# Patient Record
Sex: Male | Born: 1950
Health system: Southern US, Community
[De-identification: ages and names within clinical notes are randomized; demographics above are authoritative.]

## PROBLEM LIST (undated history)

## (undated) DIAGNOSIS — I10 Essential (primary) hypertension: Secondary | ICD-10-CM

## (undated) DIAGNOSIS — I4510 Unspecified right bundle-branch block: Secondary | ICD-10-CM

## (undated) DIAGNOSIS — K219 Gastro-esophageal reflux disease without esophagitis: Secondary | ICD-10-CM

## (undated) DIAGNOSIS — M1990 Unspecified osteoarthritis, unspecified site: Secondary | ICD-10-CM

## (undated) DIAGNOSIS — R9431 Abnormal electrocardiogram [ECG] [EKG]: Secondary | ICD-10-CM

## (undated) DIAGNOSIS — E785 Hyperlipidemia, unspecified: Secondary | ICD-10-CM

## (undated) DIAGNOSIS — M75100 Unspecified rotator cuff tear or rupture of unspecified shoulder, not specified as traumatic: Secondary | ICD-10-CM

## (undated) DIAGNOSIS — G4730 Sleep apnea, unspecified: Secondary | ICD-10-CM

## (undated) DIAGNOSIS — I739 Peripheral vascular disease, unspecified: Secondary | ICD-10-CM

## (undated) DIAGNOSIS — E039 Hypothyroidism, unspecified: Secondary | ICD-10-CM

## (undated) DIAGNOSIS — M109 Gout, unspecified: Secondary | ICD-10-CM

## (undated) HISTORY — PX: JOINT REPLACEMENT: SHX530

## (undated) HISTORY — PX: CATARACT EXTRACTION: SUR2

## (undated) HISTORY — DX: Gout, unspecified: M10.9

## (undated) HISTORY — DX: Unspecified right bundle-branch block: I45.10

## (undated) HISTORY — DX: Abnormal electrocardiogram (ECG) (EKG): R94.31

## (undated) HISTORY — DX: Sleep apnea, unspecified: G47.30

## (undated) HISTORY — DX: Unspecified rotator cuff tear or rupture of unspecified shoulder, not specified as traumatic: M75.100

## (undated) HISTORY — PX: EYE SURGERY: SHX253

## (undated) HISTORY — DX: Essential (primary) hypertension: I10

## (undated) HISTORY — PX: TONSILLECTOMY: SUR1361

## (undated) HISTORY — PX: APPENDECTOMY: SHX54

## (undated) HISTORY — PX: SEPTOPLASTY: SUR1290

## (undated) HISTORY — DX: Hyperlipidemia, unspecified: E78.5

## (undated) HISTORY — PX: FEMORAL ENDARTERECTOMY: SUR606

---

## 2002-10-07 ENCOUNTER — Ambulatory Visit (HOSPITAL_COMMUNITY): Admission: RE | Admit: 2002-10-07 | Discharge: 2002-10-07 | Payer: Self-pay | Admitting: *Deleted

## 2003-10-09 ENCOUNTER — Ambulatory Visit (HOSPITAL_COMMUNITY): Admission: RE | Admit: 2003-10-09 | Discharge: 2003-10-09 | Payer: Self-pay | Admitting: *Deleted

## 2003-11-17 ENCOUNTER — Ambulatory Visit (HOSPITAL_COMMUNITY): Admission: RE | Admit: 2003-11-17 | Discharge: 2003-11-17 | Payer: Self-pay | Admitting: *Deleted

## 2004-12-28 ENCOUNTER — Encounter: Admission: RE | Admit: 2004-12-28 | Discharge: 2004-12-28 | Payer: Self-pay | Admitting: Family Medicine

## 2005-02-16 ENCOUNTER — Encounter: Admission: RE | Admit: 2005-02-16 | Discharge: 2005-02-16 | Payer: Self-pay | Admitting: Cardiovascular Disease

## 2005-02-21 ENCOUNTER — Ambulatory Visit (HOSPITAL_COMMUNITY): Admission: RE | Admit: 2005-02-21 | Discharge: 2005-02-21 | Payer: Self-pay | Admitting: Cardiovascular Disease

## 2005-03-08 ENCOUNTER — Ambulatory Visit (HOSPITAL_COMMUNITY): Admission: RE | Admit: 2005-03-08 | Discharge: 2005-03-08 | Payer: Self-pay | Admitting: Cardiovascular Disease

## 2005-05-11 ENCOUNTER — Inpatient Hospital Stay (HOSPITAL_COMMUNITY): Admission: RE | Admit: 2005-05-11 | Discharge: 2005-05-13 | Payer: Self-pay | Admitting: *Deleted

## 2005-11-28 ENCOUNTER — Encounter: Admission: RE | Admit: 2005-11-28 | Discharge: 2005-11-28 | Payer: Self-pay | Admitting: Cardiovascular Disease

## 2010-09-05 ENCOUNTER — Encounter: Payer: Self-pay | Admitting: Cardiovascular Disease

## 2013-03-25 ENCOUNTER — Other Ambulatory Visit: Payer: Self-pay | Admitting: Gastroenterology

## 2016-10-04 ENCOUNTER — Other Ambulatory Visit: Payer: Self-pay | Admitting: Physician Assistant

## 2016-10-04 DIAGNOSIS — M25519 Pain in unspecified shoulder: Secondary | ICD-10-CM

## 2016-10-17 ENCOUNTER — Ambulatory Visit
Admission: RE | Admit: 2016-10-17 | Discharge: 2016-10-17 | Disposition: A | Discharge status: Home / Self Care | Payer: BLUE CROSS/BLUE SHIELD | Source: Ambulatory Visit | Attending: Physician Assistant | Admitting: Physician Assistant

## 2016-10-17 DIAGNOSIS — M25519 Pain in unspecified shoulder: Secondary | ICD-10-CM

## 2016-11-18 ENCOUNTER — Other Ambulatory Visit: Payer: Self-pay | Admitting: Orthopedic Surgery

## 2016-12-01 NOTE — H&P (Signed)
  Norman Lambert is an 66 y.o. male.    Chief Complaint: right shoulder pain and weakness  HPI: Pt is a 66 y.o. male complaining of right shoulder pain for multiple years. Pain had continually increased since the beginning. X-rays in the clinic show end-stage arthritic changes of the right shoulder. Pt has tried various conservative treatments which have failed to alleviate their symptoms, including injections and therapy. Various options are discussed with the patient. Risks, benefits and expectations were discussed with the patient. Patient understand the risks, benefits and expectations and wishes to proceed with surgery.   PCP:  No primary care provider on file.  D/C Plans: Home  PMH: No past medical history on file.  PSH: No past surgical history on file.  Social History:  has no tobacco, alcohol, and drug history on file.  Allergies:  Allergies not on file  Medications: No current facility-administered medications for this encounter.    No current outpatient prescriptions on file.    No results found for this or any previous visit (from the past 48 hour(s)). No results found.  ROS: Pain with rom of the right upper extremity  Physical Exam:  Alert and oriented 66 y.o. male in no acute distress Cranial nerves 2-12 intact Cervical spine: full rom with no tenderness, nv intact distally Chest: active breath sounds bilaterally, no wheeze rhonchi or rales Heart: regular rate and rhythm, no murmur Abd: non tender non distended with active bowel sounds Hip is stable with rom  Right shoulder with limited rom and strength with ER and IR No rashes or edema  Assessment/Plan Assessment: right shoulder cuff arthropathy  Plan: Patient will undergo a right reverse total shoulder by Dr. Veverly Fells at [facility]. Risks benefits and expectations were discussed with the patient. Patient understand risks, benefits and expectations and wishes to proceed.

## 2016-12-07 NOTE — Pre-Procedure Instructions (Signed)
Norman Lambert  12/07/2016     No Pharmacies Listed   Your procedure is scheduled on May 4  Report to [facility] at 830 A.M.  Call this number if you have problems the morning of surgery:  [phone]   Remember:  Do not eat food or drink liquids after midnight.  Take these medicines the morning of surgery with A SIP OF WATER   Stop taking aspirin, BC's, Goody's, herbal medications, Fish Oil, Vitamins, Ibuprofen, Advil, Motrin, Aleve   Do not wear jewelry, make-up or nail polish.  Do not wear lotions, powders, or perfumes, or deoderant.  Do not shave 48 hours prior to surgery.  Men may shave face and neck.  Do not bring valuables to the hospital.  [facility] is not responsible for any belongings or valuables.  Contacts, dentures or bridgework may not be worn into surgery.  Leave your suitcase in the car.  After surgery it may be brought to your room.  For patients admitted to the hospital, discharge time will be determined by your treatment team.  Patients discharged the day of surgery will not be allowed to drive home.    Special instructions:  [place] - Preparing for Surgery  Before surgery, you can play an important role.  Because skin is not sterile, your skin needs to be as free of germs as possible.  You can reduce the number of germs on you skin by washing with CHG (chlorahexidine gluconate) soap before surgery.  CHG is an antiseptic cleaner which kills germs and bonds with the skin to continue killing germs even after washing.  Please DO NOT use if you have an allergy to CHG or antibacterial soaps.  If your skin becomes reddened/irritated stop using the CHG and inform your nurse when you arrive at Short Stay.  Do not shave (including legs and underarms) for at least 48 hours prior to the first CHG shower.  You may shave your face.  Please follow these instructions carefully:   1.  Shower with CHG Soap the night before surgery and the                                 morning of Surgery.  2.  If you choose to wash your hair, wash your hair first as usual with your       normal shampoo.  3.  After you shampoo, rinse your hair and body thoroughly to remove the                      Shampoo.  4.  Use CHG as you would any other liquid soap.  You can apply chg directly       to the skin and wash gently with scrungie or a clean washcloth.  5.  Apply the CHG Soap to your body ONLY FROM THE NECK DOWN.        Do not use on open wounds or open sores.  Avoid contact with your eyes,       ears, mouth and genitals (private parts).  Wash genitals (private parts)       with your normal soap.  6.  Wash thoroughly, paying special attention to the area where your surgery        will be performed.  7.  Thoroughly rinse your body with warm water from the neck down.  8.  DO NOT shower/wash with your normal soap after using and rinsing off       the CHG Soap.  9.  Pat yourself dry with a clean towel.            10.  Wear clean pajamas.            11.  Place clean sheets on your bed the night of your first shower and do not        sleep with pets.  Day of Surgery  Do not apply any lotions/deoderants the morning of surgery.  Please wear clean clothes to the hospital/surgery center.     Please read over the following fact sheets that you were given. Pain Booklet, Coughing and Deep Breathing, MRSA Information and Surgical Site Infection Prevention

## 2016-12-08 ENCOUNTER — Encounter (HOSPITAL_COMMUNITY): Payer: Self-pay

## 2016-12-08 ENCOUNTER — Encounter (HOSPITAL_COMMUNITY)
Admission: RE | Admit: 2016-12-08 | Discharge: 2016-12-08 | Disposition: A | Discharge status: Home / Self Care | Payer: BLUE CROSS/BLUE SHIELD | Source: Ambulatory Visit | Attending: Orthopedic Surgery | Admitting: Orthopedic Surgery

## 2016-12-08 DIAGNOSIS — I739 Peripheral vascular disease, unspecified: Secondary | ICD-10-CM | POA: Insufficient documentation

## 2016-12-08 DIAGNOSIS — E039 Hypothyroidism, unspecified: Secondary | ICD-10-CM | POA: Insufficient documentation

## 2016-12-08 DIAGNOSIS — I4510 Unspecified right bundle-branch block: Secondary | ICD-10-CM | POA: Insufficient documentation

## 2016-12-08 DIAGNOSIS — Z01810 Encounter for preprocedural cardiovascular examination: Secondary | ICD-10-CM | POA: Insufficient documentation

## 2016-12-08 DIAGNOSIS — I10 Essential (primary) hypertension: Secondary | ICD-10-CM | POA: Diagnosis not present

## 2016-12-08 DIAGNOSIS — Z01812 Encounter for preprocedural laboratory examination: Secondary | ICD-10-CM | POA: Insufficient documentation

## 2016-12-08 DIAGNOSIS — M12811 Other specific arthropathies, not elsewhere classified, right shoulder: Secondary | ICD-10-CM | POA: Insufficient documentation

## 2016-12-08 DIAGNOSIS — K219 Gastro-esophageal reflux disease without esophagitis: Secondary | ICD-10-CM | POA: Diagnosis not present

## 2016-12-08 HISTORY — DX: Peripheral vascular disease, unspecified: I73.9

## 2016-12-08 HISTORY — DX: Gastro-esophageal reflux disease without esophagitis: K21.9

## 2016-12-08 HISTORY — DX: Hypothyroidism, unspecified: E03.9

## 2016-12-08 HISTORY — DX: Essential (primary) hypertension: I10

## 2016-12-08 LAB — BASIC METABOLIC PANEL
Anion gap: 11 (ref 5–15)
BUN: 9 mg/dL (ref 6–20)
CALCIUM: 9.4 mg/dL (ref 8.9–10.3)
CO2: 26 mmol/L (ref 22–32)
CREATININE: 0.59 mg/dL — AB (ref 0.61–1.24)
Chloride: 101 mmol/L (ref 101–111)
GFR calc Af Amer: >60 mL/min (ref >60)
GLUCOSE: 91 mg/dL (ref 65–99)
Potassium: 3.3 mmol/L — ABNORMAL LOW (ref 3.5–5.1)
Sodium: 138 mmol/L (ref 135–145)

## 2016-12-08 LAB — CBC
HCT: 39.9 % (ref 39.0–52.0)
HEMOGLOBIN: 13.5 g/dL (ref 13.0–17.0)
MCH: 30.7 pg (ref 26.0–34.0)
MCHC: 33.8 g/dL (ref 30.0–36.0)
MCV: 90.7 fL (ref 78.0–100.0)
PLATELETS: 177 10*3/uL (ref 150–400)
RBC: 4.4 MIL/uL (ref 4.22–5.81)
RDW: 13.6 % (ref 11.5–15.5)
WBC: 5.6 10*3/uL (ref 4.0–10.5)

## 2016-12-08 LAB — SURGICAL PCR SCREEN
MRSA, PCR: NEGATIVE
STAPHYLOCOCCUS AUREUS: NEGATIVE

## 2016-12-08 NOTE — Pre-Procedure Instructions (Signed)
Norman Lambert  12/08/2016      [id number] - [place], [place] - [address] [name] [address] Phone: [phone] Fax: [phone]    Your procedure is scheduled on Fri. May 4  Report to [facility] Admitting at 8:30 A.M.  Call this number if you have problems the morning of surgery:  [phone]   Remember:  Do not eat food or drink liquids after midnight on Thurs. May 3   Take these medicines the morning of surgery with A SIP OF WATER : levothyroxine (synthroid), xyzal, prilosec, flomax             1 week prior to surgery stop advil, motrin, ibuprofen, vitamins/herbal medicines.   Do not wear jewelry.  Do not wear lotions, powders, or perfumes, or deoderant.  Do not shave 48 hours prior to surgery.  Men may shave face and neck.  Do not bring valuables to the hospital.  [facility] is not responsible for any belongings or valuables.  Contacts, dentures or bridgework may not be worn into surgery.  Leave your suitcase in the car.  After surgery it may be brought to your room.  For patients admitted to the hospital, discharge time will be determined by your treatment team.  Patients discharged the day of surgery will not be allowed to drive home.    Special instructions:  [place]- Preparing For Surgery  Before surgery, you can play an important role. Because skin is not sterile, your skin needs to be as free of germs as possible. You can reduce the number of germs on your skin by washing with CHG (chlorahexidine gluconate) Soap before surgery.  CHG is an antiseptic cleaner which kills germs and bonds with the skin to continue killing germs even after washing.  Please do not use if you have an allergy to CHG or antibacterial soaps. If your skin becomes reddened/irritated stop using the CHG.  Do not shave (including legs and underarms) for at least 48 hours prior to first CHG shower. It is OK to shave your  face.  Please follow these instructions carefully.   1. Shower the NIGHT BEFORE SURGERY and the MORNING OF SURGERY with CHG.   2. If you chose to wash your hair, wash your hair first as usual with your normal shampoo.  3. After you shampoo, rinse your hair and body thoroughly to remove the shampoo.  4. Use CHG as you would any other liquid soap. You can apply CHG directly to the skin and wash gently with a scrungie or a clean washcloth.   5. Apply the CHG Soap to your body ONLY FROM THE NECK DOWN.  Do not use on open wounds or open sores. Avoid contact with your eyes, ears, mouth and genitals (private parts). Wash genitals (private parts) with your normal soap.  6. Wash thoroughly, paying special attention to the area where your surgery will be performed.  7. Thoroughly rinse your body with warm water from the neck down.  8. DO NOT shower/wash with your normal soap after using and rinsing off the CHG Soap.  9. Pat yourself dry with a CLEAN TOWEL.   10. Wear CLEAN PAJAMAS   11. Place CLEAN SHEETS on your bed the night of your first shower and DO NOT SLEEP WITH PETS.    Day of Surgery: Do not apply any deodorants/lotions. Please wear clean clothes to the hospital/surgery center.  Please read over the following fact sheets that you were given. Coughing and Deep Breathing, MRSA Information and Surgical Site Infection Prevention

## 2016-12-08 NOTE — Progress Notes (Signed)
PCP: Merian Capron, PA @ [facility]  Cardiologist: none

## 2016-12-09 NOTE — Progress Notes (Signed)
Anesthesia Chart Review:  Pt is a 66 year old male scheduled for R reverse shoulder arthroplasty on 12/16/2016 with Netta Cedars, M.D.  - PCP is Corine Shelter, PA who has cleared pt for surgery.   PMH includes: HTN, PVD, hypothyroidism, GERD. Former smoker. BMI 30.  Medications include: Albuterol, amlodipine-benazepril, HCTZ, levothyroxine, Provigil, Prilosec, rosuvastatin  Preoperative labs reviewed.  EKG 12/08/16: Sinus rhythm with PACs. RBBB is new since previous tracing 05/06/05.  - I will forward EKG to PCP for follow up.   If no changes, I anticipate pt can proceed with surgery as scheduled.   Willeen Cass, FNP-BC [facility] Short Stay Surgical Center/Anesthesiology Phone: [phone] 12/09/2016 3:14 PM

## 2016-12-13 ENCOUNTER — Telehealth: Payer: Self-pay | Admitting: *Deleted

## 2016-12-13 NOTE — Telephone Encounter (Signed)
NOTES SENT TO SCHEDULING.  °

## 2016-12-15 MED ORDER — CEFAZOLIN SODIUM-DEXTROSE 2-4 GM/100ML-% IV SOLN
2.0000 g | INTRAVENOUS | Status: AC
  Administered 2016-12-16: 2 g via INTRAVENOUS
  Filled 2016-12-15: qty 100

## 2016-12-16 ENCOUNTER — Encounter (HOSPITAL_COMMUNITY)
Admission: RE | Disposition: A | Discharge status: Home / Self Care | Payer: Self-pay | Source: Ambulatory Visit | Attending: Orthopedic Surgery

## 2016-12-16 ENCOUNTER — Inpatient Hospital Stay (HOSPITAL_COMMUNITY)
Admission: RE | Admit: 2016-12-16 | Discharge: 2016-12-17 | DRG: 483 | Disposition: A | Discharge status: Home / Self Care | Payer: BLUE CROSS/BLUE SHIELD | Source: Ambulatory Visit | Attending: Orthopedic Surgery | Admitting: Orthopedic Surgery

## 2016-12-16 ENCOUNTER — Inpatient Hospital Stay (HOSPITAL_COMMUNITY): Payer: BLUE CROSS/BLUE SHIELD | Admitting: Certified Registered Nurse Anesthetist

## 2016-12-16 ENCOUNTER — Inpatient Hospital Stay (HOSPITAL_COMMUNITY): Payer: BLUE CROSS/BLUE SHIELD | Admitting: Emergency Medicine

## 2016-12-16 ENCOUNTER — Encounter (HOSPITAL_COMMUNITY): Payer: Self-pay | Admitting: Certified Registered Nurse Anesthetist

## 2016-12-16 ENCOUNTER — Inpatient Hospital Stay (HOSPITAL_COMMUNITY): Payer: BLUE CROSS/BLUE SHIELD

## 2016-12-16 DIAGNOSIS — I10 Essential (primary) hypertension: Secondary | ICD-10-CM | POA: Diagnosis present

## 2016-12-16 DIAGNOSIS — M19011 Primary osteoarthritis, right shoulder: Principal | ICD-10-CM | POA: Diagnosis present

## 2016-12-16 DIAGNOSIS — Z471 Aftercare following joint replacement surgery: Secondary | ICD-10-CM | POA: Diagnosis not present

## 2016-12-16 DIAGNOSIS — M25511 Pain in right shoulder: Secondary | ICD-10-CM | POA: Diagnosis present

## 2016-12-16 DIAGNOSIS — Z96611 Presence of right artificial shoulder joint: Secondary | ICD-10-CM

## 2016-12-16 DIAGNOSIS — M75101 Unspecified rotator cuff tear or rupture of right shoulder, not specified as traumatic: Secondary | ICD-10-CM | POA: Diagnosis present

## 2016-12-16 DIAGNOSIS — Z87891 Personal history of nicotine dependence: Secondary | ICD-10-CM | POA: Diagnosis not present

## 2016-12-16 DIAGNOSIS — K219 Gastro-esophageal reflux disease without esophagitis: Secondary | ICD-10-CM | POA: Diagnosis not present

## 2016-12-16 HISTORY — PX: REVERSE SHOULDER ARTHROPLASTY: SHX5054

## 2016-12-16 SURGERY — ARTHROPLASTY, SHOULDER, TOTAL, REVERSE
Anesthesia: General | Site: Shoulder | Laterality: Right

## 2016-12-16 MED ORDER — CHLORHEXIDINE GLUCONATE 4 % EX LIQD: 60.0000 mL | Freq: Once | CUTANEOUS | Status: DC

## 2016-12-16 MED ORDER — CLOBETASOL PROPIONATE 0.05 % EX CREA
1.0000 "application " | TOPICAL_CREAM | Freq: Two times a day (BID) | CUTANEOUS | Status: DC
  Filled 2016-12-16: qty 15

## 2016-12-16 MED ORDER — HYDROCORTISONE 1 % EX CREA
TOPICAL_CREAM | Freq: Two times a day (BID) | CUTANEOUS | Status: DC
  Filled 2016-12-16: qty 28

## 2016-12-16 MED ORDER — DEXTROSE 5 % IV SOLN
500.0000 mg | Freq: Four times a day (QID) | INTRAVENOUS | Status: DC | PRN
  Filled 2016-12-16: qty 5

## 2016-12-16 MED ORDER — METOCLOPRAMIDE HCL 5 MG PO TABS: 5.0000 mg | ORAL_TABLET | Freq: Three times a day (TID) | ORAL | Status: DC | PRN

## 2016-12-16 MED ORDER — AMLODIPINE BESYLATE 10 MG PO TABS
10.0000 mg | ORAL_TABLET | Freq: Every day | ORAL | Status: DC
  Filled 2016-12-16: qty 1

## 2016-12-16 MED ORDER — EPINEPHRINE PF 1 MG/ML IJ SOLN
INTRAMUSCULAR | Status: AC
  Filled 2016-12-16: qty 1

## 2016-12-16 MED ORDER — PROPOFOL 10 MG/ML IV BOLUS
INTRAVENOUS | Status: AC
  Filled 2016-12-16: qty 20

## 2016-12-16 MED ORDER — PROPOFOL 10 MG/ML IV BOLUS
INTRAVENOUS | Status: DC | PRN
  Administered 2016-12-16: 150 mg via INTRAVENOUS

## 2016-12-16 MED ORDER — SODIUM CHLORIDE 0.9 % IV SOLN
INTRAVENOUS | Status: DC
  Administered 2016-12-16: 15:00:00 via INTRAVENOUS

## 2016-12-16 MED ORDER — CEFAZOLIN SODIUM-DEXTROSE 2-4 GM/100ML-% IV SOLN
2.0000 g | Freq: Four times a day (QID) | INTRAVENOUS | Status: AC
  Administered 2016-12-16 – 2016-12-17 (×3): 2 g via INTRAVENOUS
  Filled 2016-12-16 (×3): qty 100

## 2016-12-16 MED ORDER — FENTANYL CITRATE (PF) 100 MCG/2ML IJ SOLN
INTRAMUSCULAR | Status: DC | PRN
Start: 2016-12-16 — End: 2016-12-16
  Administered 2016-12-16: 100 ug via INTRAVENOUS

## 2016-12-16 MED ORDER — METOCLOPRAMIDE HCL 5 MG/ML IJ SOLN: 5.0000 mg | Freq: Three times a day (TID) | INTRAMUSCULAR | Status: DC | PRN

## 2016-12-16 MED ORDER — DOCUSATE SODIUM 100 MG PO CAPS
100.0000 mg | ORAL_CAPSULE | Freq: Two times a day (BID) | ORAL | Status: DC
  Administered 2016-12-16: 100 mg via ORAL
  Filled 2016-12-16: qty 1

## 2016-12-16 MED ORDER — MORPHINE SULFATE (PF) 4 MG/ML IV SOLN: 4.0000 mg | INTRAVENOUS | Status: DC | PRN

## 2016-12-16 MED ORDER — ONDANSETRON HCL 4 MG PO TABS: 4.0000 mg | ORAL_TABLET | Freq: Four times a day (QID) | ORAL | Status: DC | PRN

## 2016-12-16 MED ORDER — 0.9 % SODIUM CHLORIDE (POUR BTL) OPTIME
TOPICAL | Status: DC | PRN
  Administered 2016-12-16: 1000 mL

## 2016-12-16 MED ORDER — KRILL OIL 1000 MG PO CAPS: 1000.0000 mg | ORAL_CAPSULE | Freq: Two times a day (BID) | ORAL | Status: DC

## 2016-12-16 MED ORDER — BENAZEPRIL HCL 20 MG PO TABS
40.0000 mg | ORAL_TABLET | Freq: Every day | ORAL | Status: DC
  Filled 2016-12-16: qty 2

## 2016-12-16 MED ORDER — LEVOTHYROXINE SODIUM 112 MCG PO TABS: 112.0000 ug | ORAL_TABLET | Freq: Every day | ORAL | Status: DC

## 2016-12-16 MED ORDER — TRAMADOL HCL 50 MG PO TABS
50.0000 mg | ORAL_TABLET | Freq: Three times a day (TID) | ORAL | Status: DC | PRN
  Administered 2016-12-16 – 2016-12-17 (×2): 100 mg via ORAL
  Filled 2016-12-16 (×2): qty 2

## 2016-12-16 MED ORDER — HYDROMORPHONE HCL 1 MG/ML IJ SOLN: 0.2500 mg | INTRAMUSCULAR | Status: DC | PRN

## 2016-12-16 MED ORDER — BUPIVACAINE-EPINEPHRINE 0.25% -1:200000 IJ SOLN
INTRAMUSCULAR | Status: DC | PRN
  Administered 2016-12-16: 10 mL

## 2016-12-16 MED ORDER — SUGAMMADEX SODIUM 200 MG/2ML IV SOLN
INTRAVENOUS | Status: DC | PRN
  Administered 2016-12-16: 400 mg via INTRAVENOUS

## 2016-12-16 MED ORDER — APPLE CIDER VINEGAR 500 MG PO TABS: 500.0000 mg | ORAL_TABLET | Freq: Two times a day (BID) | ORAL | Status: DC

## 2016-12-16 MED ORDER — PHENYLEPHRINE HCL 10 MG/ML IJ SOLN
INTRAVENOUS | Status: DC | PRN
  Administered 2016-12-16: 40 ug/min via INTRAVENOUS

## 2016-12-16 MED ORDER — LEVOCETIRIZINE DIHYDROCHLORIDE 5 MG PO TABS: 5.0000 mg | ORAL_TABLET | Freq: Every evening | ORAL | Status: DC

## 2016-12-16 MED ORDER — TAMSULOSIN HCL 0.4 MG PO CAPS
0.8000 mg | ORAL_CAPSULE | Freq: Every day | ORAL | Status: DC
  Filled 2016-12-16: qty 2

## 2016-12-16 MED ORDER — NICOTINE 10 MG IN INHA: 1.0000 | RESPIRATORY_TRACT | Status: DC | PRN

## 2016-12-16 MED ORDER — EPHEDRINE SULFATE 50 MG/ML IJ SOLN
INTRAMUSCULAR | Status: DC | PRN
  Administered 2016-12-16 (×3): 10 mg via INTRAVENOUS

## 2016-12-16 MED ORDER — OXYCODONE HCL 5 MG PO TABS
5.0000 mg | ORAL_TABLET | ORAL | Status: DC | PRN
  Administered 2016-12-16 – 2016-12-17 (×4): 10 mg via ORAL
  Administered 2016-12-17: 5 mg via ORAL
  Administered 2016-12-17 (×2): 10 mg via ORAL
  Filled 2016-12-16 (×7): qty 2

## 2016-12-16 MED ORDER — HYDROCHLOROTHIAZIDE 25 MG PO TABS
25.0000 mg | ORAL_TABLET | Freq: Every day | ORAL | Status: DC
  Filled 2016-12-16: qty 1

## 2016-12-16 MED ORDER — METHOCARBAMOL 500 MG PO TABS: 500.0000 mg | ORAL_TABLET | Freq: Three times a day (TID) | ORAL | 1 refills | Status: DC | PRN

## 2016-12-16 MED ORDER — PHENOL 1.4 % MT LIQD: 1.0000 | OROMUCOSAL | Status: DC | PRN

## 2016-12-16 MED ORDER — LIDOCAINE HCL (CARDIAC) 20 MG/ML IV SOLN
INTRAVENOUS | Status: DC | PRN
  Administered 2016-12-16: 100 mg via INTRAVENOUS

## 2016-12-16 MED ORDER — METHOCARBAMOL 500 MG PO TABS
500.0000 mg | ORAL_TABLET | Freq: Four times a day (QID) | ORAL | Status: DC | PRN
  Administered 2016-12-16 – 2016-12-17 (×4): 500 mg via ORAL
  Filled 2016-12-16 (×4): qty 1

## 2016-12-16 MED ORDER — SODIUM CHLORIDE 0.9 % IV SOLN
1000.0000 mg | Freq: Once | INTRAVENOUS | Status: DC
  Filled 2016-12-16: qty 10

## 2016-12-16 MED ORDER — POLYETHYLENE GLYCOL 3350 17 G PO PACK: 17.0000 g | PACK | Freq: Every day | ORAL | Status: DC | PRN

## 2016-12-16 MED ORDER — BEE POLLEN 1000 MG PO TABS: 2000.0000 mg | ORAL_TABLET | Freq: Every day | ORAL | Status: DC

## 2016-12-16 MED ORDER — PANTOPRAZOLE SODIUM 40 MG PO TBEC
40.0000 mg | DELAYED_RELEASE_TABLET | Freq: Every day | ORAL | Status: DC
  Filled 2016-12-16: qty 1

## 2016-12-16 MED ORDER — LACTATED RINGERS IV SOLN
INTRAVENOUS | Status: DC
  Administered 2016-12-16: 11:00:00 via INTRAVENOUS
  Administered 2016-12-16: 50 mL/h via INTRAVENOUS
  Administered 2016-12-16: 09:00:00 via INTRAVENOUS

## 2016-12-16 MED ORDER — BUPIVACAINE HCL (PF) 0.25 % IJ SOLN
INTRAMUSCULAR | Status: AC
  Filled 2016-12-16: qty 30

## 2016-12-16 MED ORDER — MENTHOL 3 MG MT LOZG: 1.0000 | LOZENGE | OROMUCOSAL | Status: DC | PRN

## 2016-12-16 MED ORDER — ONDANSETRON HCL 4 MG/2ML IJ SOLN: 4.0000 mg | Freq: Four times a day (QID) | INTRAMUSCULAR | Status: DC | PRN

## 2016-12-16 MED ORDER — FENTANYL CITRATE (PF) 100 MCG/2ML IJ SOLN
INTRAMUSCULAR | Status: AC
  Administered 2016-12-16: 100 ug
  Filled 2016-12-16: qty 2

## 2016-12-16 MED ORDER — TRANEXAMIC ACID 1000 MG/10ML IV SOLN
1000.0000 mg | INTRAVENOUS | Status: AC
  Administered 2016-12-16: 1000 mg via INTRAVENOUS
  Filled 2016-12-16: qty 10

## 2016-12-16 MED ORDER — MIDAZOLAM HCL 2 MG/2ML IJ SOLN
INTRAMUSCULAR | Status: AC
  Administered 2016-12-16: 2 mg
  Filled 2016-12-16: qty 2

## 2016-12-16 MED ORDER — LORATADINE 10 MG PO TABS
10.0000 mg | ORAL_TABLET | Freq: Every day | ORAL | Status: DC
  Filled 2016-12-16: qty 1

## 2016-12-16 MED ORDER — ROCURONIUM BROMIDE 100 MG/10ML IV SOLN
INTRAVENOUS | Status: DC | PRN
  Administered 2016-12-16: 50 mg via INTRAVENOUS

## 2016-12-16 MED ORDER — OMEPRAZOLE MAGNESIUM 20 MG PO TBEC: 20.0000 mg | DELAYED_RELEASE_TABLET | Freq: Every day | ORAL | Status: DC

## 2016-12-16 MED ORDER — TRIAMCINOLONE ACETONIDE 0.1 % EX CREA: 1.0000 "application " | TOPICAL_CREAM | Freq: Two times a day (BID) | CUTANEOUS | Status: DC

## 2016-12-16 MED ORDER — MIDAZOLAM HCL 2 MG/2ML IJ SOLN
INTRAMUSCULAR | Status: AC
  Filled 2016-12-16: qty 2

## 2016-12-16 MED ORDER — ONDANSETRON HCL 4 MG/2ML IJ SOLN
INTRAMUSCULAR | Status: DC | PRN
  Administered 2016-12-16: 4 mg via INTRAVENOUS

## 2016-12-16 MED ORDER — OXYCODONE-ACETAMINOPHEN 5-325 MG PO TABS: 1.0000 | ORAL_TABLET | ORAL | 0 refills | Status: DC | PRN

## 2016-12-16 MED ORDER — ONDANSETRON HCL 4 MG/2ML IJ SOLN: 4.0000 mg | Freq: Once | INTRAMUSCULAR | Status: DC | PRN

## 2016-12-16 MED ORDER — MEPERIDINE HCL 25 MG/ML IJ SOLN: 6.2500 mg | INTRAMUSCULAR | Status: DC | PRN

## 2016-12-16 MED ORDER — ALBUTEROL SULFATE (2.5 MG/3ML) 0.083% IN NEBU: 3.0000 mL | INHALATION_SOLUTION | Freq: Three times a day (TID) | RESPIRATORY_TRACT | Status: DC | PRN

## 2016-12-16 MED ORDER — MODAFINIL 100 MG PO TABS
200.0000 mg | ORAL_TABLET | Freq: Every day | ORAL | Status: DC
  Filled 2016-12-16: qty 2

## 2016-12-16 MED ORDER — ACETAMINOPHEN 650 MG RE SUPP: 650.0000 mg | Freq: Four times a day (QID) | RECTAL | Status: DC | PRN

## 2016-12-16 MED ORDER — FLUOCINONIDE 0.05 % EX SOLN
1.0000 "application " | Freq: Two times a day (BID) | CUTANEOUS | Status: DC | PRN
  Filled 2016-12-16: qty 60

## 2016-12-16 MED ORDER — AMLODIPINE BESY-BENAZEPRIL HCL 10-40 MG PO CAPS: 1.0000 | ORAL_CAPSULE | Freq: Every day | ORAL | Status: DC

## 2016-12-16 MED ORDER — FENTANYL CITRATE (PF) 250 MCG/5ML IJ SOLN
INTRAMUSCULAR | Status: AC
  Filled 2016-12-16: qty 5

## 2016-12-16 MED ORDER — ROSUVASTATIN CALCIUM 5 MG PO TABS
5.0000 mg | ORAL_TABLET | Freq: Every day | ORAL | Status: DC
  Administered 2016-12-16: 5 mg via ORAL
  Filled 2016-12-16: qty 1

## 2016-12-16 MED ORDER — ACETAMINOPHEN 325 MG PO TABS: 650.0000 mg | ORAL_TABLET | Freq: Four times a day (QID) | ORAL | Status: DC | PRN

## 2016-12-16 SURGICAL SUPPLY — 66 items
BIT DRILL 5/64X5 DISP (BIT) ×2 IMPLANT
BLADE SAG 18X100X1.27 (BLADE) ×2 IMPLANT
CAPT SHLDR REVTOTAL 1 ×1 IMPLANT
COVER SURGICAL LIGHT HANDLE (MISCELLANEOUS) ×2 IMPLANT
DECANTER SPIKE VIAL GLASS SM (MISCELLANEOUS) ×1 IMPLANT
DRAPE IMP U-DRAPE 54X76 (DRAPES) ×4 IMPLANT
DRAPE INCISE IOBAN 66X45 STRL (DRAPES) ×2 IMPLANT
DRAPE ORTHO SPLIT 77X108 STRL (DRAPES) ×4
DRAPE SURG ORHT 6 SPLT 77X108 (DRAPES) ×2 IMPLANT
DRAPE U-SHAPE 47X51 STRL (DRAPES) ×2 IMPLANT
DRAPE X-RAY CASS 24X20 (DRAPES) IMPLANT
DRSG ADAPTIC 3X8 NADH LF (GAUZE/BANDAGES/DRESSINGS) ×2 IMPLANT
DRSG PAD ABDOMINAL 8X10 ST (GAUZE/BANDAGES/DRESSINGS) ×2 IMPLANT
DURAPREP 26ML APPLICATOR (WOUND CARE) ×2 IMPLANT
ELECT BLADE 4.0 EZ CLEAN MEGAD (MISCELLANEOUS) ×2
ELECT NDL TIP 2.8 STRL (NEEDLE) ×1 IMPLANT
ELECT NEEDLE TIP 2.8 STRL (NEEDLE) ×2 IMPLANT
ELECT REM PT RETURN 9FT ADLT (ELECTROSURGICAL) ×2
ELECTRODE BLDE 4.0 EZ CLN MEGD (MISCELLANEOUS) ×1 IMPLANT
ELECTRODE REM PT RTRN 9FT ADLT (ELECTROSURGICAL) ×1 IMPLANT
GAUZE SPONGE 4X4 12PLY STRL (GAUZE/BANDAGES/DRESSINGS) ×2 IMPLANT
GAUZE SPONGE 4X4 16PLY XRAY LF (GAUZE/BANDAGES/DRESSINGS) ×1 IMPLANT
GLOVE BIOGEL PI ORTHO PRO 7.5 (GLOVE) ×1
GLOVE BIOGEL PI ORTHO PRO SZ8 (GLOVE) ×1
GLOVE ORTHO TXT STRL SZ7.5 (GLOVE) ×2 IMPLANT
GLOVE PI ORTHO PRO STRL 7.5 (GLOVE) ×1 IMPLANT
GLOVE PI ORTHO PRO STRL SZ8 (GLOVE) ×1 IMPLANT
GLOVE SURG ORTHO 8.5 STRL (GLOVE) ×2 IMPLANT
GOWN STRL REUS W/ TWL LRG LVL3 (GOWN DISPOSABLE) ×1 IMPLANT
GOWN STRL REUS W/ TWL XL LVL3 (GOWN DISPOSABLE) ×2 IMPLANT
GOWN STRL REUS W/TWL LRG LVL3 (GOWN DISPOSABLE) ×2
GOWN STRL REUS W/TWL XL LVL3 (GOWN DISPOSABLE) ×4
HANDPIECE INTERPULSE COAX TIP (DISPOSABLE)
KIT BASIN OR (CUSTOM PROCEDURE TRAY) ×2 IMPLANT
KIT ROOM TURNOVER OR (KITS) ×2 IMPLANT
MANIFOLD NEPTUNE II (INSTRUMENTS) ×2 IMPLANT
NDL 1/2 CIR MAYO (NEEDLE) ×1 IMPLANT
NDL HYPO 25GX1X1/2 BEV (NEEDLE) ×1 IMPLANT
NEEDLE 1/2 CIR MAYO (NEEDLE) ×2 IMPLANT
NEEDLE HYPO 25GX1X1/2 BEV (NEEDLE) ×2 IMPLANT
NS IRRIG 1000ML POUR BTL (IV SOLUTION) ×2 IMPLANT
PACK SHOULDER (CUSTOM PROCEDURE TRAY) ×2 IMPLANT
PAD ARMBOARD 7.5X6 YLW CONV (MISCELLANEOUS) ×4 IMPLANT
SET HNDPC FAN SPRY TIP SCT (DISPOSABLE) IMPLANT
SLING ARM FOAM STRAP LRG (SOFTGOODS) IMPLANT
SLING ARM FOAM STRAP MED (SOFTGOODS) IMPLANT
SLING ARM FOAM STRAP XLG (SOFTGOODS) ×1 IMPLANT
SPONGE LAP 18X18 X RAY DECT (DISPOSABLE) IMPLANT
SPONGE LAP 4X18 X RAY DECT (DISPOSABLE) ×2 IMPLANT
STRIP CLOSURE SKIN 1/2X4 (GAUZE/BANDAGES/DRESSINGS) ×2 IMPLANT
SUCTION FRAZIER HANDLE 10FR (MISCELLANEOUS) ×1
SUCTION TUBE FRAZIER 10FR DISP (MISCELLANEOUS) ×1 IMPLANT
SUT FIBERWIRE #2 38 T-5 BLUE (SUTURE) ×6
SUT MNCRL AB 4-0 PS2 18 (SUTURE) ×2 IMPLANT
SUT VIC AB 0 CT2 27 (SUTURE) ×2 IMPLANT
SUT VIC AB 2-0 CT1 27 (SUTURE) ×2
SUT VIC AB 2-0 CT1 TAPERPNT 27 (SUTURE) ×1 IMPLANT
SUTURE FIBERWR #2 38 T-5 BLUE (SUTURE) ×2 IMPLANT
SYR CONTROL 10ML LL (SYRINGE) ×2 IMPLANT
TAPE CLOTH SURG 6X10 WHT LF (GAUZE/BANDAGES/DRESSINGS) ×1 IMPLANT
TOWEL OR 17X24 6PK STRL BLUE (TOWEL DISPOSABLE) ×2 IMPLANT
TOWEL OR 17X26 10 PK STRL BLUE (TOWEL DISPOSABLE) ×2 IMPLANT
TOWER CARTRIDGE SMART MIX (DISPOSABLE) IMPLANT
TRAY FOLEY W/METER SILVER 16FR (SET/KITS/TRAYS/PACK) IMPLANT
WATER STERILE IRR 1000ML POUR (IV SOLUTION) ×2 IMPLANT
YANKAUER SUCT BULB TIP NO VENT (SUCTIONS) ×2 IMPLANT

## 2016-12-16 NOTE — Anesthesia Procedure Notes (Signed)
Anesthesia Regional Block: Interscalene brachial plexus block   Pre-Anesthetic Checklist: ,, timeout performed, Correct Patient, Correct Site, Correct Laterality, Correct Procedure, Correct Position, site marked, Risks and benefits discussed,  Surgical consent,  Pre-op evaluation,  At surgeon's request and post-op pain management  Laterality: Right  Prep: chloraprep       Needles:  Injection technique: Single-shot  Needle Type: Echogenic Stimulator Needle     Needle Length: 9cm  Needle Gauge: 21     Additional Needles:   Procedures: ultrasound guided, nerve stimulator,,,,,,   Nerve Stimulator or Paresthesia:  Response: 0.4 mA,   Additional Responses:   Narrative:  Start time: 12/16/2016 8:20 AM End time: 12/16/2016 8:40 AM Injection made incrementally with aspirations every 5 mL.  Performed by: Personally  Anesthesiologist: Lillia Abed  Additional Notes: Monitors applied. Patient sedated. Sterile prep and drape,hand hygiene and sterile gloves were used. Relevant anatomy identified.Needle position confirmed.Local anesthetic injected incrementally after negative aspiration. Local anesthetic spread visualized around nerve(s). Vascular puncture avoided. No complications. Image printed for medical record.The patient tolerated the procedure well.

## 2016-12-16 NOTE — Brief Op Note (Signed)
12/16/2016  11:47 AM  PATIENT:  [name]  66 y.o. male  PRE-OPERATIVE DIAGNOSIS:  Right rotator cuff arthropathy  POST-OPERATIVE DIAGNOSIS:  Right rotator cuff arthropathy  PROCEDURE:  Procedure(s): RIGHT REVERSE TOTAL SHOULDER ARTHROPLASTY (Right) DePuy Delta Xtend   SURGEON:  Surgeon(s) and Role:    * Netta Cedars, MD - Primary  PHYSICIAN ASSISTANT:   ASSISTANTS: Ventura Bruns, PA-C   ANESTHESIA:   regional and general  EBL:  Total I/O In: 1000 [I.V.:1000] Out: 200 [Blood:200]  BLOOD ADMINISTERED:none  DRAINS: none   LOCAL MEDICATIONS USED:  MARCAINE     SPECIMEN:  No Specimen  DISPOSITION OF SPECIMEN:  N/A  COUNTS:  YES  TOURNIQUET:  * No tourniquets in log *  DICTATION: .Other Dictation: Dictation Number [id number]  PLAN OF CARE: Admit to inpatient   PATIENT DISPOSITION:  PACU - hemodynamically stable.   Delay start of Pharmacological VTE agent (>24hrs) due to surgical blood loss or risk of bleeding: not applicable

## 2016-12-16 NOTE — Discharge Instructions (Signed)
Ice to the shoulder at all times.  Keep the incision clean and dry and intact and covered for one week, then ok to get it wet in the shower.  Do gentle exercises every 4 hours.  Ok to remove the sling for exercises and while seated.  Wear the sling while up and around.  Do not push pull or lift with the right arm.  Do not push out of chair and do no reach behind the back  Follow up in the office in two weeks [phone]

## 2016-12-16 NOTE — Interval H&P Note (Signed)
History and Physical Interval Note:  12/16/2016 9:21 AM  [name]  has presented today for surgery, with the diagnosis of Right rotator cuff arthropathy  The various methods of treatment have been discussed with the patient and family. After consideration of risks, benefits and other options for treatment, the patient has consented to  Procedure(s): REVERSE RIGHT SHOULDER ARTHROPLASTY (Right) as a surgical intervention .  The patient's history has been reviewed, patient examined, no change in status, stable for surgery.  I have reviewed the patient's chart and labs.  Questions were answered to the patient's satisfaction.     Jasalyn Frysinger,STEVEN R

## 2016-12-16 NOTE — Op Note (Signed)
Norman Lambert, Norman Lambert NO.:  [id number]  MEDICAL RECORD NO.:  [id number]  LOCATION:                                 FACILITY:  PHYSICIAN:  Doran Heater. Veverly Fells, M.D.      DATE OF BIRTH:  DATE OF PROCEDURE:  12/16/2016 DATE OF DISCHARGE:                              OPERATIVE REPORT   PREOPERATIVE DIAGNOSIS:  Right shoulder rotator cuff tear arthropathy.  POSTOPERATIVE DIAGNOSIS:  Right shoulder rotator cuff tear arthropathy.  PROCEDURE PERFORMED:  Right reverse total shoulder arthroplasty using DePuy Delta Xtend prosthesis.  ATTENDING SURGEON:  Doran Heater. Veverly Fells, MD.  ASSISTANT:  Ventura Bruns, [facility], who was scrubbed the entire procedure and necessary for satisfactory completion of surgery.  ANESTHESIA:  General anesthesia was used plus interscalene block.  ESTIMATED BLOOD LOSS:  200 mL.  FLUID REPLACEMENT:  1500 mL crystalloid.  INSTRUMENT COUNTS:  Correct.  COMPLICATIONS:  There were no complications.  ANTIBIOTICS:  Perioperative antibiotics were given.  INDICATIONS:  The patient is a 66 year old male with worsening right shoulder pain secondary to rotator cuff tear arthropathy.  The patient has profound functional loss and pain and is refractory to conservative management.  We discussed options for management and the patient elected to proceed with reverse total shoulder arthroplasty performed to provide significant pain relief and improved function.  Risks and benefits of surgery discussed.  Informed consent obtained.  DESCRIPTION OF PROCEDURE:  After an adequate level of anesthesia achieved, the patient was positioned in the modified beach-chair position.  Right shoulder correctly identified, sterilely prepped and draped in usual manner.  Time-out called.  We initiated surgery using standard deltopectoral approach, started at the coracoid process extending down to the anterior humerus.  Dissection down through subcutaneous tissues using Bovie,  identified cephalic vein, took it laterally the deltoid, pectoralis taken medially.  Conjoint tendon identified and retracted medially.  The biceps tenodesed in situ with 0 Vicryl suture figure-of-eight x2 into the pec tendon.  We then went and released the subscapularis subperiosteally off the lesser tuberosity and tagged with #2 Hi-Fi suture.  Modified Mason-Allen suture technique for repair at the end.  Next, we went ahead and released the inferior capsule with progressive external rotation of the humerus.  We released the biceps tendon, the remnant of the supraspinatus and infraspinatus tendon.  There really was no infraspinatus.  Supraspinatus was just a remnant and was not in good shape, that was resected.  The teres minor was preserved.  With extension of the shoulder, we were able to enter the proximal humerus with a 6 mm reamer reaming up to a size #16.  We then placed our 16 mm intramedullary resection guide and resected the head 10 degrees retroversion.  We then removed excess osteophytes with a rongeur, primarily anteriorly.  We then went ahead and milled for the epi-2 right metaphyseal component.  Once we had that reaming done, then we placed our trial component 16 stem epi-2 right set on the 0 setting, placed in 10 degrees retroversion, impacted in position.  We kept the trial implant on the humeral side in place to protect the bone, subluxed the humerus posteriorly, did a  360-degree capsular release and capsular removal as well as glenoid labrum removal, carefully protected the axillary nerve.  We had 360-degree exposure of the glenoid face.  We went ahead and removed the remaining cartilage on there with a curette, with a Cobb elevator, placed our central guide pin, reamed for the metaglene base plate and then drilled the central peg hole.  We then impacted the metaglene in position referencing off the inferior scapular neck area.  We then placed a 48 screw locked  inferiorly, 36 at the base of the coracoid, and then 24 locked anteriorly and 18 nonlocked posteriorly.  Excellent base plate fixation, 42 standard glenosphere impacted and screwed home.  Once that was secured, checked the axillary nerve, was not tethered or impinged.  We then went ahead and trialed with a 38+ 3, felt like that was a little bit easy to get in, but once it was in, it was stable and the axillary nerve was under some tension. We trialed with a +6 and I just felt too tight to the axillary nerve that was a little bit better feeling during the reduction, but either way, we had negative sulcus, negative gapping with external rotation and a conjoint was tight, so we removed the trial components on the humeral side, irrigated thoroughly, drilled holes and placed #2 FiberWire suture for repair of the subscap back anatomically to the lesser tuberosity. We then impacted, used bone graft from the head in impaction grafting technique to impact the 16 stem HA coated stem and epi-2 right set on the 0 setting, placed in 10 degrees retroversion that was impacted and it was very secure.  We then selected the 42+ 3 poly, impacted that in position, reduced the shoulder, very stable once reduced and we then repaired anatomically subscapularis back to bone.  Once we had that repaired, we ranged the shoulder, it did not restrict the external rotation at all.  We then went ahead and irrigated thoroughly, had a nice stable shoulder.  We repaired the deltopectoral interval with 0 Vicryl suture, followed by 2-0 Vicryl for subcutaneous closure, and 4-0 Monocryl for skin.  Steri-Strips applied, followed by sterile dressing. The patient tolerated surgery well.     Doran Heater. Veverly Fells, M.D.     SRN/MEDQ  D:  12/16/2016  T:  12/16/2016  Job:  [id number]

## 2016-12-16 NOTE — Telephone Encounter (Signed)
Received faxed referral packet from [facility] for upcoming appointment with Dr. Oval Linsey on 01/02/2017.  Records given to [facility].  cbr

## 2016-12-16 NOTE — Anesthesia Postprocedure Evaluation (Signed)
Anesthesia Post Note  Patient: Norman Lambert  Procedure(s) Performed: Procedure(s) (LRB): RIGHT REVERSE TOTAL SHOULDER ARTHROPLASTY (Right)  Patient location during evaluation: PACU Anesthesia Type: General Level of consciousness: awake and alert Pain management: pain level controlled Vital Signs Assessment: post-procedure vital signs reviewed and stable Respiratory status: spontaneous breathing, nonlabored ventilation, respiratory function stable and patient connected to nasal cannula oxygen Cardiovascular status: blood pressure returned to baseline and stable Postop Assessment: no signs of nausea or vomiting Anesthetic complications: no       Last Vitals:  Vitals:   12/16/16 1245 12/16/16 1313  BP:  131/62  Pulse:  68  Resp:  17  Temp: 36.7 C 36.7 C    Last Pain:  Vitals:   12/16/16 1313  TempSrc: Oral  PainSc: 0-No pain                 Latera Mclin DAVID

## 2016-12-16 NOTE — Transfer of Care (Signed)
Immediate Anesthesia Transfer of Care Note  Patient: Norman Lambert  Procedure(s) Performed: Procedure(s): RIGHT REVERSE TOTAL SHOULDER ARTHROPLASTY (Right)  Patient Location: PACU  Anesthesia Type:GA combined with regional for post-op pain  Level of Consciousness: awake, alert  and oriented  Airway & Oxygen Therapy: Patient Spontanous Breathing and Patient connected to nasal cannula oxygen  Post-op Assessment: Report given to RN and Post -op Vital signs reviewed and stable  Post vital signs: Reviewed and stable  Last Vitals:  Vitals:   12/16/16 0850 12/16/16 1145  BP: (!) 155/44 134/68  Pulse: 87 81  Resp: 13 19  Temp:  36.3 C    Last Pain:  Vitals:   12/16/16 1145  TempSrc:   PainSc: 0-No pain      Patients Stated Pain Goal: 7 (33/35/45 6256)  Complications: No apparent anesthesia complications

## 2016-12-16 NOTE — Anesthesia Procedure Notes (Signed)
Procedure Name: Intubation Date/Time: 12/16/2016 9:43 AM Performed by: Ollen Bowl Pre-anesthesia Checklist: Patient identified, Emergency Drugs available, Suction available, Patient being monitored and Timeout performed Patient Re-evaluated:Patient Re-evaluated prior to inductionOxygen Delivery Method: Circle system utilized and Simple face mask Preoxygenation: Pre-oxygenation with 100% oxygen Intubation Type: IV induction Ventilation: Mask ventilation with difficulty, Oral airway inserted - appropriate to patient size and Two handed mask ventilation required Laryngoscope Size: Miller and 3 Grade View: Grade I Tube type: Oral Tube size: 7.5 mm Number of attempts: 1 Airway Equipment and Method: Patient positioned with wedge pillow and Stylet Placement Confirmation: ETT inserted through vocal cords under direct vision,  positive ETCO2 and breath sounds checked- equal and bilateral Secured at: 23 cm Tube secured with: Tape Dental Injury: Teeth and Oropharynx as per pre-operative assessment

## 2016-12-16 NOTE — Anesthesia Preprocedure Evaluation (Addendum)
Anesthesia Evaluation  Patient identified by MRN, date of birth, ID band Patient awake    Reviewed: Allergy & Precautions, NPO status , Patient's Chart, lab work & pertinent test results  Airway Mallampati: I  TM Distance: >3 FB Neck ROM: Full    Dental  (+) Teeth Intact, Dental Advisory Given   Pulmonary former smoker,    Pulmonary exam normal        Cardiovascular hypertension, Pt. on medications Normal cardiovascular exam     Neuro/Psych    GI/Hepatic GERD  Medicated and Controlled,  Endo/Other    Renal/GU      Musculoskeletal   Abdominal   Peds  Hematology   Anesthesia Other Findings   Reproductive/Obstetrics                            Anesthesia Physical Anesthesia Plan  ASA: II  Anesthesia Plan: General   Post-op Pain Management:  Regional for Post-op pain   Induction: Intravenous  Airway Management Planned: Oral ETT  Additional Equipment:   Intra-op Plan:   Post-operative Plan: Extubation in OR  Informed Consent: I have reviewed the patients History and Physical, chart, labs and discussed the procedure including the risks, benefits and alternatives for the proposed anesthesia with the patient or authorized representative who has indicated his/her understanding and acceptance.     Plan Discussed with: CRNA and Surgeon  Anesthesia Plan Comments:         Anesthesia Quick Evaluation

## 2016-12-17 LAB — BASIC METABOLIC PANEL
Anion gap: 11 (ref 5–15)
BUN: 8 mg/dL (ref 6–20)
CALCIUM: 8.8 mg/dL — AB (ref 8.9–10.3)
CO2: 26 mmol/L (ref 22–32)
CREATININE: 0.6 mg/dL — AB (ref 0.61–1.24)
Chloride: 93 mmol/L — ABNORMAL LOW (ref 101–111)
GFR calc Af Amer: >60 mL/min (ref >60)
GLUCOSE: 112 mg/dL — AB (ref 65–99)
Potassium: 3.5 mmol/L (ref 3.5–5.1)
SODIUM: 130 mmol/L — AB (ref 135–145)

## 2016-12-17 LAB — HEMOGLOBIN AND HEMATOCRIT, BLOOD
HEMATOCRIT: 37.5 % — AB (ref 39.0–52.0)
HEMOGLOBIN: 12.2 g/dL — AB (ref 13.0–17.0)

## 2016-12-17 NOTE — Progress Notes (Signed)
Patient ID: AURELIUS GILDERSLEEVE, male   DOB: 08-13-1951, 66 y.o.   MRN: [id number] Subjective: 1 Day Post-Op Procedure(s) (LRB): RIGHT REVERSE TOTAL SHOULDER ARTHROPLASTY (Right)    Patient reports pain as mild to moderate.  Just back to chair from bathroom.  No issues reported  Objective:   VITALS:   Vitals:   12/17/16 0024 12/17/16 0453  BP: (!) 124/55 (!) 123/58  Pulse: 71 72  Resp:    Temp: 98.3 F (36.8 C) 97.9 F (36.6 C)    Neurovascular intact Incision: dressing C/D/I and moderate drainage, dried on gauze - dressing changed to aquacell  LABS  Recent Labs  12/17/16 0729  HGB 12.2*  HCT 37.5*    No results for input(s): NA, K, BUN, CREATININE, GLUCOSE in the last 72 hours.  No results for input(s): LABPT, INR in the last 72 hours.   Assessment/Plan: 1 Day Post-Op Procedure(s) (LRB): RIGHT REVERSE TOTAL SHOULDER ARTHROPLASTY (Right)   Advance diet Up with therapy   Home today RTC in 2 weeks D/C instructions provided per Veverly Fells

## 2016-12-17 NOTE — Evaluation (Signed)
Occupational Therapy Evaluation Patient Details Name: Norman Lambert MRN: [id number] DOB: 1951-06-21 Today's Date: 12/17/2016    History of Present Illness Pt is a 66 y.o. male s/p R reverse total shoulder arthroplasty secondary to R rotator cuff arthropathy. No PMH on file.   Clinical Impression   PTA, pt was independent with ADL and functional mobility. Pt currently requires overall supervision for safety with ADL and functional mobility with VC's to adhere to conservative shoulder protocol precautions. Pt educated concerning ADL techniques, sling wear schedule, positioning for sleeping and showering, and use of ice for pain management. Additionally pt educated concerning elbow/wrist/hand AROM HEP. Pt demonstrates understanding of all topics. All education complete and no further acute OT needs identified. Recommend rehabilitation of shoulder per MD order and D/C home with initial 24 hour assistance from family. Acute OT will sign off.    Follow Up Recommendations  Supervision/Assistance - 24 hour (Progress rehab of shoulder per MD order)    Equipment Recommendations  None recommended by OT    Recommendations for Other Services       Precautions / Restrictions Precautions Precautions: Shoulder Type of Shoulder Precautions: Conservative protocol: sling at all times except for dressing/bathing/exercises, AROM elbow/wrist/hand, NWB R UE, and per MD order pt okay for gentle ADL slowly without weightbearing and limit ER to 0 and FE to 90 due to repaired subscapularis. Shoulder Interventions: Shoulder sling/immobilizer Precaution Booklet Issued: Yes (comment) Precaution Comments: Educated pt on shoulder conservative protocol including dressing/bathing techniques as well as HEP. Required Braces or Orthoses: Sling (R shoulder) Restrictions Weight Bearing Restrictions: Yes RUE Weight Bearing: Non weight bearing      Mobility Bed Mobility               General bed mobility comments:  OOB in chair on arrival  Transfers Overall transfer level: Needs assistance Equipment used: None Transfers: Sit to/from Stand Sit to Stand: Supervision         General transfer comment: Supervision for safety.    Balance Overall balance assessment: No apparent balance deficits (not formally assessed)                                         ADL either performed or assessed with clinical judgement   ADL Overall ADL's : Needs assistance/impaired Eating/Feeding: Set up;Sitting   Grooming: Supervision/safety;Standing   Upper Body Bathing: Supervision/ safety;Sitting   Lower Body Bathing: Sit to/from stand;Supervison/ safety   Upper Body Dressing : Sitting;Supervision/safety   Lower Body Dressing: Sit to/from stand;Supervision/safety   Toilet Transfer: Supervision/safety;Ambulation;Regular Toilet   Toileting- Water quality scientist and Hygiene: Supervision/safety;Sit to/from stand   Tub/ Shower Transfer: Supervision/safety;Walk-in shower;Shower seat;Ambulation   Functional mobility during ADLs: Supervision/safety General ADL Comments: Pt educated on dressing/bathing techniques, positioning for sleep, and conservative shoulder protocol HEP.     Vision Baseline Vision/History: Wears glasses Wears Glasses: Reading only Patient Visual Report: No change from baseline Vision Assessment?: No apparent visual deficits     Perception     Praxis      Pertinent Vitals/Pain Pain Assessment: Faces Faces Pain Scale: Hurts little more Pain Location: R shoulder Pain Descriptors / Indicators: Aching;Operative site guarding;Sore Pain Intervention(s): Monitored during session;Repositioned     Hand Dominance Right   Extremity/Trunk Assessment Upper Extremity Assessment Upper Extremity Assessment: RUE deficits/detail RUE Deficits / Details: Decreased strength and ROM as expected post-operatively within parameters of shoulder conservative  protocol. RUE: Unable to  fully assess due to immobilization   Lower Extremity Assessment Lower Extremity Assessment: Overall WFL for tasks assessed       Communication Communication Communication: No difficulties   Cognition Arousal/Alertness: Awake/alert Behavior During Therapy: WFL for tasks assessed/performed Overall Cognitive Status: Within Functional Limits for tasks assessed                                     General Comments       Exercises Exercises: Shoulder Shoulder Exercises Elbow Flexion: AROM;Right;10 reps;Standing Wrist Flexion: AROM;Right;10 reps;Seated Wrist Extension: AROM;Right;10 reps;Seated Digit Composite Flexion: AROM;Right;10 reps;Seated Composite Extension: AROM;10 reps;Seated;Right   Shoulder Instructions Shoulder Instructions Donning/doffing shirt without moving shoulder: Supervision/safety Method for sponge bathing under operated UE: Supervision/safety Donning/doffing sling/immobilizer: Supervision/safety Correct positioning of sling/immobilizer: Supervision/safety ROM for elbow, wrist and digits of operated UE: Supervision/safety Sling wearing schedule (on at all times/off for ADL's): Supervision/safety Proper positioning of operated UE when showering: Supervision/safety Positioning of UE while sleeping: [place] expects to be discharged to:: Private residence Living Arrangements: Spouse/significant other Available Help at Discharge: Family;Available 24 hours/day Type of Home: House Home Access: Stairs to enter     Home Layout: One level;Other (Comment) (with upstairs room over garage)     Bathroom Shower/Tub: Tub/shower unit;Walk-in shower   Bathroom Toilet: Handicapped height     Home Equipment: Clinical cytogeneticist - 2 wheels          Prior Functioning/Environment Level of Independence: Independent        Comments: Working        OT Problem List: Decreased strength;Decreased range of  motion;Decreased activity tolerance;Impaired balance (sitting and/or standing);Decreased safety awareness;Decreased knowledge of use of DME or AE;Decreased knowledge of precautions;Impaired UE functional use;Pain      OT Treatment/Interventions:      OT Goals(Current goals can be found in the care plan section) Acute Rehab OT Goals Patient Stated Goal: go home today OT Goal Formulation: With patient Time For Goal Achievement: 12/31/16 Potential to Achieve Goals: Good  OT Frequency:     Barriers to D/C:            Co-evaluation              AM-PAC PT "6 Clicks" Daily Activity     Outcome Measure Help from another person eating meals?: None Help from another person taking care of personal grooming?: A Little Help from another person toileting, which includes using toliet, bedpan, or urinal?: A Little Help from another person bathing (including washing, rinsing, drying)?: A Little Help from another person to put on and taking off regular upper body clothing?: A Little Help from another person to put on and taking off regular lower body clothing?: A Little 6 Click Score: 19   End of Session Equipment Utilized During Treatment:  (R shoulder sling) Nurse Communication: Mobility status;Other (comment) (OT complete)  Activity Tolerance: Patient tolerated treatment well Patient left: in chair;with call bell/phone within reach  OT Visit Diagnosis: Pain Pain - Right/Left: Right Pain - part of body: Shoulder                Time: 8099-8338 OT Time Calculation (min): 17 min Charges:  OT General Charges $OT Visit: 1 Procedure OT Evaluation $OT Eval Moderate Complexity: 1 Procedure G-Codes:     Norman Herrlich, MS OTR/L  Pager: [phone]  Eyad Rochford A Joua Bake 12/17/2016, 9:50 AM

## 2016-12-19 ENCOUNTER — Encounter (HOSPITAL_COMMUNITY): Payer: Self-pay | Admitting: Orthopedic Surgery

## 2016-12-21 NOTE — Discharge Summary (Signed)
Physician Discharge Summary   Patient ID: Norman Lambert MRN: [id number] DOB/AGE: [birth year] 66 y.o.  Admit date: 12/16/2016 Discharge date: 12/17/2016  Admission Diagnoses:  Active Problems:   S/P shoulder replacement, right   Discharge Diagnoses:  Same   Surgeries: Procedure(s): RIGHT REVERSE TOTAL SHOULDER ARTHROPLASTY on 12/16/2016   Consultants: PT/OT  Discharged Condition: Stable  Hospital Course: Norman Lambert is an 66 y.o. male who was admitted 12/16/2016 with a chief complaint of right shoulder pain, and found to have a diagnosis of right shoulder cuff arthropathy.  They were brought to the operating room on 12/16/2016 and underwent the above named procedures.    The patient had an uncomplicated hospital course and was stable for discharge.  Recent vital signs:  Vitals:   12/17/16 0024 12/17/16 0453  BP: (!) 124/55 (!) 123/58  Pulse: 71 72  Resp:    Temp: 98.3 F (36.8 C) 97.9 F (36.6 C)    Recent laboratory studies:  Results for orders placed or performed during the hospital encounter of 12/16/16  Hemoglobin and hematocrit, blood  Result Value Ref Range   Hemoglobin 12.2 (L) 13.0 - 17.0 g/dL   HCT 37.5 (L) 39.0 - 71.6 %  Basic metabolic panel  Result Value Ref Range   Sodium 130 (L) 135 - 145 mmol/L   Potassium 3.5 3.5 - 5.1 mmol/L   Chloride 93 (L) 101 - 111 mmol/L   CO2 26 22 - 32 mmol/L   Glucose, Bld 112 (H) 65 - 99 mg/dL   BUN 8 6 - 20 mg/dL   Creatinine, Ser 0.60 (L) 0.61 - 1.24 mg/dL   Calcium 8.8 (L) 8.9 - 10.3 mg/dL   GFR calc non Af Amer >60 >60 mL/min   GFR calc Af Amer >60 >60 mL/min   Anion gap 11 5 - 15    Discharge Medications:   Allergies as of 12/17/2016      Reactions   Other Rash, Other (See Comments)   " DARK DYES " [ UNSPECIFIED DYE ]      Medication List    TAKE these medications   amLODipine-benazepril 10-40 MG capsule Commonly known as:  LOTREL Take 1 capsule by mouth daily.   Apple Cider Vinegar 500 MG Tabs Take 500 mg  by mouth 2 (two) times daily.   Bee Pollen 1000 MG Tabs Take 2,000 mg by mouth daily.   CENTRUM SILVER PO Take 1 tablet by mouth daily.   clobetasol cream 0.05 % Commonly known as:  TEMOVATE Apply 1 application topically 2 (two) times daily.   desonide 0.05 % cream Commonly known as:  DESOWEN Apply 1 application topically 2 (two) times daily.   fluocinonide 0.05 % external solution Commonly known as:  LIDEX Apply 1 application topically 2 (two) times daily as needed (for skin).   hydrochlorothiazide 25 MG tablet Commonly known as:  HYDRODIURIL Take 25 mg by mouth daily.   Krill Oil 1000 MG Caps Take 1,000 mg by mouth 2 (two) times daily.   levocetirizine 5 MG tablet Commonly known as:  XYZAL Take 5 mg by mouth every evening.   levothyroxine 112 MCG tablet Commonly known as:  SYNTHROID, LEVOTHROID Take 112 mcg by mouth daily before breakfast.   methocarbamol 500 MG tablet Commonly known as:  ROBAXIN Take 1 tablet (500 mg total) by mouth 3 (three) times daily as needed.   modafinil 200 MG tablet Commonly known as:  PROVIGIL Take 200 mg by mouth daily.   nicotine 10  MG inhaler Commonly known as:  NICOTROL Inhale 1 continuous puffing into the lungs as needed for smoking cessation.   omeprazole 20 MG tablet Commonly known as:  PRILOSEC OTC Take 20 mg by mouth daily.   OVER THE COUNTER MEDICATION Take 1 capsule by mouth daily. OTC Super Bio C Buffered   oxyCODONE-acetaminophen 5-325 MG tablet Commonly known as:  ROXICET Take 1-2 tablets by mouth every 4 (four) hours as needed for severe pain.   PROAIR RESPICLICK 811 (90 Base) MCG/ACT Aepb Generic drug:  Albuterol Sulfate Inhale 2 puffs into the lungs 3 (three) times daily as needed (for shortness of breath).   PROBIOTIC COLON SUPPORT PO Take 1 capsule by mouth daily.   rosuvastatin 5 MG tablet Commonly known as:  CRESTOR Take 5 mg by mouth daily.   ROYAL JELLY/BEE PROPOLIS PO Take 1 tablet by mouth  daily.   SAW PALMETTO PLUS PO Take 2 capsules by mouth daily.   SILYMARIN PO Take 1 capsule by mouth daily.   tamsulosin 0.4 MG Caps capsule Commonly known as:  FLOMAX Take 0.8 mg by mouth daily.   traMADol 50 MG tablet Commonly known as:  ULTRAM Take 50-100 mg by mouth every 8 (eight) hours as needed for moderate pain.   triamcinolone cream 0.1 % Commonly known as:  KENALOG Apply 1 application topically 2 (two) times daily.       Diagnostic Studies: Dg Shoulder Right Port  Result Date: 12/16/2016 CLINICAL DATA:  66 year old male status post right shoulder arthroplasty. EXAM: PORTABLE RIGHT SHOULDER COMPARISON:  09/26/2016. FINDINGS: Portable view at 1217 hours. Right total shoulder arthroplasty hardware appears intact and normally aligned. Postoperative changes including soft tissue gas about the right shoulder. No other acute osseous abnormality identified. Negative visible right ribs and lung parenchyma. IMPRESSION: Right shoulder arthroplasty with no adverse features. Electronically Signed   By: Genevie Ann M.D.   On: 12/16/2016 12:26    Disposition: 01-Home or Self Care  Discharge Instructions    Call MD / Call 911    Complete by:  As directed    If you experience chest pain or shortness of breath, CALL 911 and be transported to the hospital emergency room.  If you develope a fever above 101 F, pus (white drainage) or increased drainage or redness at the wound, or calf pain, call your surgeon's office.   Constipation Prevention    Complete by:  As directed    Drink plenty of fluids.  Prune juice may be helpful.  You may use a stool softener, such as Colace (over the counter) 100 mg twice a day.  Use MiraLax (over the counter) for constipation as needed.   Diet - low sodium heart healthy    Complete by:  As directed    Discharge instructions    Complete by:  As directed    Per Alphonsa Gin dressing on shoulder keep in place until removed in office may shower and get  dressing wet as it is water proof   Increase activity slowly as tolerated    Complete by:  As directed       Follow-up Information    Netta Cedars, MD. Call in 2 week(s).   Specialty:  Orthopedic Surgery Why:  [phone] Contact information: [address] [place] [address] [phone]            Signed: Ventura Bruns 12/21/2016, 8:53 AM

## 2016-12-31 NOTE — Progress Notes (Signed)
Cardiology Office Note   Date:  01/02/2017   ID:  Norman Lambert, DOB 1951-08-06, MRN [id number]  PCP:  Corine Shelter, PA-C  Cardiologist:   Skeet Latch, MD   Chief Complaint  Patient presents with  . New Patient (Initial Visit)    Pt states no Sx.       History of Present Illness: Norman Lambert is a 66 y.o. male  hypertension, hypothyroidism, RBBB and OSA who is being seen today for the evaluation of RBBB at the request of Corine Shelter, PA-C.  Norman Lambert saw Elta Guadeloupe Helper, PA-C, on 10/31/16 for surgical clearance prior to R shoulder rotator cuff surgery.  He was noted to have a RBBB on EKG which was new from previous tracing. He underwent surgery on May 4 without complications. Norman Lambert has been feeling well and denies any chest pain, shortness of breath, palpitations, lightheadedness, dizziness, lower extremity edema, orthopnea, or PND. He does not get much exercise. He drives a lot for work. However when he does exert himself he does not have any exertional symptoms does not check his blood pressure regularly.  When he does it is usually around 130/75.     Past Medical History:  Diagnosis Date  . Abnormal EKG   . Essential hypertension 01/02/2017  . GERD (gastroesophageal reflux disease)   . Gout   . Hyperlipidemia 01/02/2017  . Hypertension   . Hypothyroidism   . Peripheral vascular disease ([place])   . RBBB 01/02/2017  . Rotator cuff tear   . Sleep apnea     Past Surgical History:  Procedure Laterality Date  . APPENDECTOMY    . CATARACT EXTRACTION Bilateral   . FEMORAL ENDARTERECTOMY Bilateral    done [facility] yrs. ago at [facility]  . REVERSE SHOULDER ARTHROPLASTY Right 12/16/2016   Procedure: RIGHT REVERSE TOTAL SHOULDER ARTHROPLASTY;  Surgeon: Netta Cedars, MD;  Location: [place];  Service: Orthopedics;  Laterality: Right;  . SEPTOPLASTY       Current Outpatient Prescriptions  Medication Sig Dispense Refill  . Albuterol Sulfate (PROAIR RESPICLICK) 128 (90 Base) MCG/ACT AEPB  Inhale 2 puffs into the lungs 3 (three) times daily as needed (for shortness of breath).    Marland Kitchen amLODipine-benazepril (LOTREL) 10-40 MG capsule Take 1 capsule by mouth daily.    Marland Kitchen Apple Cider Vinegar 500 MG TABS Take 500 mg by mouth 2 (two) times daily.    Raelyn Ensign Pollen 1000 MG TABS Take 2,000 mg by mouth daily.    . clobetasol cream (TEMOVATE) 7.86 % Apply 1 application topically 2 (two) times daily.    Marland Kitchen desonide (DESOWEN) 0.05 % cream Apply 1 application topically 2 (two) times daily.    . fluocinonide (LIDEX) 0.05 % external solution Apply 1 application topically 2 (two) times daily as needed (for skin).    . hydrochlorothiazide (HYDRODIURIL) 25 MG tablet Take 25 mg by mouth daily.    Javier Docker Oil 1000 MG CAPS Take 1,000 mg by mouth 2 (two) times daily.    Marland Kitchen levocetirizine (XYZAL) 5 MG tablet Take 5 mg by mouth every evening.    Marland Kitchen levothyroxine (SYNTHROID, LEVOTHROID) 112 MCG tablet Take 112 mcg by mouth daily before breakfast.    . methocarbamol (ROBAXIN) 500 MG tablet Take 1 tablet (500 mg total) by mouth 3 (three) times daily as needed. 60 tablet 1  . Milk Thistle-Turmeric (SILYMARIN PO) Take 1 capsule by mouth daily.    . Misc Natural Products (SAW PALMETTO PLUS PO) Take  2 capsules by mouth daily.    . modafinil (PROVIGIL) 200 MG tablet Take 200 mg by mouth daily.    . Multiple Vitamins-Minerals (CENTRUM SILVER PO) Take 1 tablet by mouth daily.    . Nutritional Supplements (ROYAL JELLY/BEE PROPOLIS PO) Take 1 tablet by mouth daily.    Marland Kitchen omeprazole (PRILOSEC OTC) 20 MG tablet Take 20 mg by mouth daily.    Marland Kitchen OVER THE COUNTER MEDICATION Take 1 capsule by mouth daily. OTC Super Bio C Buffered    . oxyCODONE-acetaminophen (ROXICET) 5-325 MG tablet Take 1-2 tablets by mouth every 4 (four) hours as needed for severe pain. 40 tablet 0  . Probiotic Product (PROBIOTIC COLON SUPPORT PO) Take 1 capsule by mouth daily.    . rosuvastatin (CRESTOR) 5 MG tablet Take 5 mg by mouth daily.    . tamsulosin  (FLOMAX) 0.4 MG CAPS capsule Take 0.8 mg by mouth daily.    . traMADol (ULTRAM) 50 MG tablet Take 50-100 mg by mouth every 8 (eight) hours as needed for moderate pain.    Marland Kitchen triamcinolone cream (KENALOG) 0.1 % Apply 1 application topically 2 (two) times daily.     No current facility-administered medications for this visit.     Allergies:   Other    Social History:  The patient  reports that he quit smoking about 30 years ago. His smoking use included Cigarettes. He has a 60.00 pack-year smoking history. He has never used smokeless tobacco. He reports that he drinks alcohol. He reports that he does not use drugs.   Family History:  The patient's family history is not on file.    ROS:  Please see the history of present illness.   Otherwise, review of systems are positive for none.   All other systems are reviewed and negative.    PHYSICAL EXAM: VS:  BP (!) 152/64   Pulse 79   Ht 6\' 2"  (1.88 m)   Wt 103 kg (227 lb)   BMI 29.15 kg/m  , BMI Body mass index is 29.15 kg/m. GENERAL:  Well appearing HEENT:  Pupils equal round and reactive, fundi not visualized, oral mucosa unremarkable NECK:  No jugular venous distention, waveform within normal limits, carotid upstroke brisk and symmetric, no bruits, no thyromegaly LYMPHATICS:  No cervical adenopathy LUNGS:  Clear to auscultation bilaterally HEART:  RRR.  PMI not displaced or sustained,S1 and S2 within normal limits, no S3, no S4, no clicks, no rubs, no murmurs ABD:  Flat, positive bowel sounds normal in frequency in pitch, no bruits, no rebound, no guarding, no midline pulsatile mass, no hepatomegaly, no splenomegaly EXT:  2 plus pulses throughout, no edema, no cyanosis no clubbing SKIN:  No rashes no nodules NEURO:  Cranial nerves II through XII grossly intact, motor grossly intact throughout PSYCH:  Cognitively intact, oriented to person place and time    EKG:  EKG is not ordered today. The ekg ordered 12/08/16 demonstrates sinus  rhythm. Rate 71 bpm. PACs. Right bundle branch block. Low voltage limb leads.   Recent Labs: 12/08/2016: Platelets 177 12/17/2016: BUN 8; Creatinine, Ser 0.60; Hemoglobin 12.2; Potassium 3.5; Sodium 130   01/02/17: Sodium 1:30, potassium 3.5, BUN 8, creatinine 0.6 WBC 5.6,Hemoglobin 12.2, hematocrit 37.5, platelets 177 Total cholesterol 163, triglycerides 192, HDL 58, LDL 67  Lipid Panel No results found for: CHOL, TRIG, HDL, CHOLHDL, VLDL, LDLCALC, LDLDIRECT    Wt Readings from Last 3 Encounters:  01/02/17 103 kg (227 lb)  12/16/16 104.3 kg (230  lb)  12/08/16 104.6 kg (230 lb 9.6 oz)      ASSESSMENT AND PLAN:  # RBBB: Norman Lambert has no symptoms.  He was noted to have a RBBB on EKG, but this is not clinically significant at this time.  He has no symptoms of ischemia or heart failure.  # Elevated blood pressure: BP is high today.  It was 118/70 at his last appointment.  He hasn't taken his BP medication yet and reports that it is typically well-controlled.  Continue to monitor.  # Hyperlipidemia: Continue rosuvastatin.  He was encouraged to increase his exercise.   Current medicines are reviewed at length with the patient today.  The patient does not have concerns regarding medicines.  The following changes have been made:  no change  Labs/ tests ordered today include:  No orders of the defined types were placed in this encounter.    Disposition:   FU with Jammy Plotkin C. Oval Linsey, MD, [facility] as needed.     This note was written with the assistance of speech recognition software.  Please excuse any transcriptional errors.  Signed, Kyla Duffy C. Oval Linsey, MD, [facility]  01/02/2017 9:07 AM    [place]

## 2017-01-02 ENCOUNTER — Encounter: Payer: Self-pay | Admitting: Cardiovascular Disease

## 2017-01-02 ENCOUNTER — Ambulatory Visit (INDEPENDENT_AMBULATORY_CARE_PROVIDER_SITE_OTHER): Payer: BLUE CROSS/BLUE SHIELD | Admitting: Cardiovascular Disease

## 2017-01-02 ENCOUNTER — Telehealth: Payer: Self-pay | Admitting: Cardiovascular Disease

## 2017-01-02 VITALS — BP 152/64 | HR 79 | Ht 74.0 in | Wt 227.0 lb

## 2017-01-02 DIAGNOSIS — E7800 Pure hypercholesterolemia, unspecified: Secondary | ICD-10-CM | POA: Diagnosis not present

## 2017-01-02 DIAGNOSIS — I4510 Unspecified right bundle-branch block: Secondary | ICD-10-CM | POA: Diagnosis not present

## 2017-01-02 DIAGNOSIS — I10 Essential (primary) hypertension: Secondary | ICD-10-CM | POA: Insufficient documentation

## 2017-01-02 DIAGNOSIS — E039 Hypothyroidism, unspecified: Secondary | ICD-10-CM

## 2017-01-02 DIAGNOSIS — E785 Hyperlipidemia, unspecified: Secondary | ICD-10-CM

## 2017-01-02 HISTORY — DX: Hyperlipidemia, unspecified: E78.5

## 2017-01-02 HISTORY — DX: Unspecified right bundle-branch block: I45.10

## 2017-01-02 HISTORY — DX: Essential (primary) hypertension: I10

## 2017-01-02 NOTE — Patient Instructions (Signed)
Medication Instructions:  ?Your physician recommends that you continue on your current medications as directed. Please refer to the Current Medication list given to you today.  ? ?Labwork: ?NONE ? ?Testing/Procedures: ?NONE ? ?Follow-Up: ?AS NEEDED  ? ?  ?

## 2017-02-14 DIAGNOSIS — Z0000 Encounter for general adult medical examination without abnormal findings: Secondary | ICD-10-CM | POA: Diagnosis not present

## 2017-02-14 DIAGNOSIS — G4730 Sleep apnea, unspecified: Secondary | ICD-10-CM | POA: Diagnosis not present

## 2017-05-17 DIAGNOSIS — Z23 Encounter for immunization: Secondary | ICD-10-CM | POA: Diagnosis not present

## 2017-06-12 DIAGNOSIS — L309 Dermatitis, unspecified: Secondary | ICD-10-CM | POA: Diagnosis not present

## 2017-06-22 ENCOUNTER — Other Ambulatory Visit: Payer: Self-pay | Admitting: *Deleted

## 2017-06-22 NOTE — Patient Outreach (Signed)
[employer] HRA attempted. No one was home. I left a message and requested a return call. If I do not hear back from Mr. Norman Lambert I will call him another day.  [name]. [name], MSN, [employer] Gerontological Nurse Practitioner [facility] Care Management [phone]

## 2017-06-23 ENCOUNTER — Other Ambulatory Visit: Payer: Self-pay | Admitting: *Deleted

## 2017-06-23 NOTE — Patient Outreach (Signed)
Second attempt to reach [facility] pt for HRA and offer AWV. I was able to leave a message on pt's home and mobile numbers. I will call him another day if I have not heard back from him.  [name]. [name], MSN, [facility] Gerontological Nurse Practitioner [facility] Care Management [phone]

## 2017-06-27 ENCOUNTER — Other Ambulatory Visit: Payer: Self-pay | Admitting: *Deleted

## 2017-06-27 NOTE — Patient Outreach (Signed)
Pt had returned my call yesterday and I was unable to answer. I have called him again today and left another message that I am wanting to make sure he gets his AWV either at his provider's office or I can provide that service for him. I will await his return call.  [name]. [name], MSN, [facility] Gerontological Nurse Practitioner [facility] Care Management [phone]

## 2017-08-17 ENCOUNTER — Encounter (HOSPITAL_COMMUNITY): Payer: Self-pay | Admitting: Family Medicine

## 2017-08-17 ENCOUNTER — Ambulatory Visit (HOSPITAL_COMMUNITY)
Admission: EM | Admit: 2017-08-17 | Discharge: 2017-08-17 | Disposition: A | Discharge status: Home / Self Care | Payer: Medicare HMO | Attending: Physician Assistant | Admitting: Physician Assistant

## 2017-08-17 DIAGNOSIS — R21 Rash and other nonspecific skin eruption: Secondary | ICD-10-CM

## 2017-08-17 DIAGNOSIS — L309 Dermatitis, unspecified: Secondary | ICD-10-CM | POA: Diagnosis not present

## 2017-08-17 MED ORDER — METHYLPREDNISOLONE SODIUM SUCC 125 MG IJ SOLR
125.0000 mg | Freq: Once | INTRAMUSCULAR | Status: AC
  Administered 2017-08-17: 125 mg via INTRAMUSCULAR

## 2017-08-17 MED ORDER — METHYLPREDNISOLONE SODIUM SUCC 125 MG IJ SOLR
INTRAMUSCULAR | Status: AC
  Filled 2017-08-17: qty 2

## 2017-08-17 NOTE — ED Provider Notes (Signed)
[name]    CSN: [id number] Arrival date & time: 08/17/17  1132     History   Chief Complaint Chief Complaint  Patient presents with  . Urticaria    HPI Norman Lambert is a 67 y.o. male.   The history is provided by the patient.  Rash  Location:  Full body Quality: dryness and itchiness   Severity:  Severe Onset quality:  Gradual Duration: 15 years. Timing:  Constant Progression:  Worsening Chronicity:  Chronic Relieved by:  Nothing Worsened by:  Nothing Pt has severe eczema.  No relief with otc medications.  No reports no relief with hydrocortisone   Past Medical History:  Diagnosis Date  . Abnormal EKG   . Essential hypertension 01/02/2017  . GERD (gastroesophageal reflux disease)   . Gout   . Hyperlipidemia 01/02/2017  . Hypertension   . Hypothyroidism   . Peripheral vascular disease ([place])   . RBBB 01/02/2017  . Rotator cuff tear   . Sleep apnea     Patient Active Problem List   Diagnosis Date Noted  . Essential hypertension 01/02/2017  . Hyperlipidemia 01/02/2017  . Hypothyroidism 01/02/2017  . RBBB 01/02/2017  . S/P shoulder replacement, right 12/16/2016    Past Surgical History:  Procedure Laterality Date  . APPENDECTOMY    . CATARACT EXTRACTION Bilateral   . FEMORAL ENDARTERECTOMY Bilateral    done 15 yrs. ago at [facility]  . REVERSE SHOULDER ARTHROPLASTY Right 12/16/2016   Procedure: RIGHT REVERSE TOTAL SHOULDER ARTHROPLASTY;  Surgeon: Netta Cedars, MD;  Location: [place];  Service: Orthopedics;  Laterality: Right;  . SEPTOPLASTY         Home Medications    Prior to Admission medications   Medication Sig Start Date End Date Taking? Authorizing Provider  Albuterol Sulfate (PROAIR RESPICLICK) 397 (90 Base) MCG/ACT AEPB Inhale 2 puffs into the lungs 3 (three) times daily as needed (for shortness of breath).    [provider]  amLODipine-benazepril (LOTREL) 10-40 MG capsule Take 1 capsule by mouth daily.    [provider]  Apple Cider Vinegar 500 MG TABS Take 500 mg by mouth 2 (two) times daily.    [provider]  Bee Pollen 1000 MG TABS Take 2,000 mg by mouth daily.    [provider]  clobetasol cream (TEMOVATE) 6.73 % Apply 1 application topically 2 (two) times daily.    [provider]  desonide (DESOWEN) 0.05 % cream Apply 1 application topically 2 (two) times daily.    [provider]  fluocinonide (LIDEX) 0.05 % external solution Apply 1 application topically 2 (two) times daily as needed (for skin).    [provider]  hydrochlorothiazide (HYDRODIURIL) 25 MG tablet Take 25 mg by mouth daily.    [provider]  Javier Docker Oil 1000 MG CAPS Take 1,000 mg by mouth 2 (two) times daily.    [provider]  levocetirizine (XYZAL) 5 MG tablet Take 5 mg by mouth every evening.    [provider]  levothyroxine (SYNTHROID, LEVOTHROID) 112 MCG tablet Take 112 mcg by mouth daily before breakfast.    [provider]  methocarbamol (ROBAXIN) 500 MG tablet Take 1 tablet (500 mg total) by mouth 3 (three) times daily as needed. 12/16/16   Netta Cedars, MD  Milk Thistle-Turmeric (SILYMARIN PO) Take 1 capsule by mouth daily.    [provider]  Misc Natural Products (SAW PALMETTO PLUS PO) Take 2 capsules by mouth daily.  [provider]  modafinil (PROVIGIL) 200 MG tablet Take 200 mg by mouth daily.    [provider]  Multiple Vitamins-Minerals (CENTRUM SILVER PO) Take 1 tablet by mouth daily.    [provider]  Nutritional Supplements (ROYAL JELLY/BEE PROPOLIS PO) Take 1 tablet by mouth daily.    [provider]  omeprazole (PRILOSEC OTC) 20 MG tablet Take 20 mg by mouth daily.    [provider]  OVER THE COUNTER MEDICATION Take 1 capsule by mouth daily. OTC Super Bio C Buffered    [provider]  oxyCODONE-acetaminophen (ROXICET) 5-325 MG tablet Take 1-2 tablets by  mouth every 4 (four) hours as needed for severe pain. 12/16/16   Netta Cedars, MD  Probiotic Product (PROBIOTIC COLON SUPPORT PO) Take 1 capsule by mouth daily.    [provider]  rosuvastatin (CRESTOR) 5 MG tablet Take 5 mg by mouth daily.    [provider]  tamsulosin (FLOMAX) 0.4 MG CAPS capsule Take 0.8 mg by mouth daily.    [provider]  traMADol (ULTRAM) 50 MG tablet Take 50-100 mg by mouth every 8 (eight) hours as needed for moderate pain.    [provider]  triamcinolone cream (KENALOG) 0.1 % Apply 1 application topically 2 (two) times daily.    [provider]    Family History History reviewed. No pertinent family history.  Social History Social History   Tobacco Use  . Smoking status: Former Smoker    Packs/day: 3.00    Years: 20.00    Pack years: 60.00    Types: Cigarettes    Last attempt to quit: 12/09/1986    Years since quitting: 30.7  . Smokeless tobacco: Never Used  Substance Use Topics  . Alcohol use: Yes    Comment: social  . Drug use: No     Allergies   Other   Review of Systems Review of Systems  Skin: Positive for rash.  All other systems reviewed and are negative.    Physical Exam Triage Vital Signs ED Triage Vitals [08/17/17 1256]  Enc Vitals Group     BP 130/73     Pulse Rate 96     Resp 18     Temp 97.8 F (36.6 C)     Temp src      SpO2 96 %     Weight      Height      Head Circumference      Peak Flow      Pain Score      Pain Loc      Pain Edu?      Excl. in [place]?    No data found.  Updated Vital Signs BP 130/73   Pulse 96   Temp 97.8 F (36.6 C)   Resp 18   SpO2 96%   Visual Acuity Right Eye Distance:   Left Eye Distance:   Bilateral Distance:    Right Eye Near:   Left Eye Near:    Bilateral Near:     Physical Exam  Constitutional: He appears well-developed and well-nourished.  HENT:  Head: Normocephalic and atraumatic.  Eyes: Conjunctivae are normal.    Neck: Neck supple.  Cardiovascular: Normal rate and regular rhythm.  No murmur heard. Pulmonary/Chest: Effort normal and breath sounds normal. No respiratory distress.  Abdominal: Soft. There is no tenderness.  Musculoskeletal: He exhibits no edema.  Neurological: He is alert.  Skin: Skin is dry. Rash noted. There is erythema.  Psychiatric: He has a normal mood and affect.  Nursing note and vitals reviewed.    UC Treatments / Results  Labs (all labs ordered are listed, but only abnormal results are displayed) Labs Reviewed - No data to display  EKG  EKG Interpretation None       Radiology No results found.  Procedures Procedures (including critical care time)  Medications Ordered in UC Medications  methylPREDNISolone sodium succinate (SOLU-MEDROL) 125 mg/2 mL injection 125 mg (not administered)     Initial Impression / Assessment and Plan / UC Course  I have reviewed the triage vital signs and the nursing notes.  Pertinent labs & imaging results that were available during my care of the patient were reviewed by me and considered in my medical decision making (see chart for details).     Pt is suppose to see a dermatologist in March.   Pt given injection of solumedrol Final Clinical Impressions(s) / UC Diagnoses   Final diagnoses:  Eczema, unspecified type    ED Discharge Orders    None       Controlled Substance Prescriptions [place] Controlled Substance Registry consulted? Not Applicable   Fransico Meadow, [place] 08/17/17 1347

## 2017-08-17 NOTE — ED Triage Notes (Signed)
Pt here for rash all over. Has been present since September. sts that he is itching, and very uncomfortable. He has taken 2 rounds of prednisone with levaditi. Seeing the derm at the end of march.

## 2017-08-28 DIAGNOSIS — L209 Atopic dermatitis, unspecified: Secondary | ICD-10-CM | POA: Diagnosis not present

## 2017-08-28 DIAGNOSIS — B359 Dermatophytosis, unspecified: Secondary | ICD-10-CM | POA: Diagnosis not present

## 2017-09-14 DIAGNOSIS — L309 Dermatitis, unspecified: Secondary | ICD-10-CM | POA: Diagnosis not present

## 2017-09-14 DIAGNOSIS — E039 Hypothyroidism, unspecified: Secondary | ICD-10-CM | POA: Diagnosis not present

## 2017-09-14 DIAGNOSIS — K219 Gastro-esophageal reflux disease without esophagitis: Secondary | ICD-10-CM | POA: Diagnosis not present

## 2017-09-14 DIAGNOSIS — J449 Chronic obstructive pulmonary disease, unspecified: Secondary | ICD-10-CM | POA: Diagnosis not present

## 2017-09-14 DIAGNOSIS — E782 Mixed hyperlipidemia: Secondary | ICD-10-CM | POA: Diagnosis not present

## 2017-09-14 DIAGNOSIS — G4730 Sleep apnea, unspecified: Secondary | ICD-10-CM | POA: Diagnosis not present

## 2017-09-14 DIAGNOSIS — I10 Essential (primary) hypertension: Secondary | ICD-10-CM | POA: Diagnosis not present

## 2017-09-14 DIAGNOSIS — N401 Enlarged prostate with lower urinary tract symptoms: Secondary | ICD-10-CM | POA: Diagnosis not present

## 2017-10-16 DIAGNOSIS — H16223 Keratoconjunctivitis sicca, not specified as Sjogren's, bilateral: Secondary | ICD-10-CM | POA: Diagnosis not present

## 2017-11-03 DIAGNOSIS — M5430 Sciatica, unspecified side: Secondary | ICD-10-CM | POA: Diagnosis not present

## 2017-11-03 DIAGNOSIS — M5442 Lumbago with sciatica, left side: Secondary | ICD-10-CM | POA: Diagnosis not present

## 2017-11-23 DIAGNOSIS — M545 Low back pain: Secondary | ICD-10-CM | POA: Diagnosis not present

## 2017-11-29 ENCOUNTER — Other Ambulatory Visit: Payer: Self-pay | Admitting: Dermatology

## 2017-11-29 DIAGNOSIS — L409 Psoriasis, unspecified: Secondary | ICD-10-CM | POA: Diagnosis not present

## 2017-11-29 DIAGNOSIS — D485 Neoplasm of uncertain behavior of skin: Secondary | ICD-10-CM | POA: Diagnosis not present

## 2017-11-29 DIAGNOSIS — L418 Other parapsoriasis: Secondary | ICD-10-CM | POA: Diagnosis not present

## 2017-11-29 DIAGNOSIS — Z79899 Other long term (current) drug therapy: Secondary | ICD-10-CM | POA: Diagnosis not present

## 2018-01-16 DIAGNOSIS — L293 Anogenital pruritus, unspecified: Secondary | ICD-10-CM | POA: Diagnosis not present

## 2018-01-16 DIAGNOSIS — Z79899 Other long term (current) drug therapy: Secondary | ICD-10-CM | POA: Diagnosis not present

## 2018-01-16 DIAGNOSIS — L400 Psoriasis vulgaris: Secondary | ICD-10-CM | POA: Diagnosis not present

## 2018-01-23 DIAGNOSIS — L539 Erythematous condition, unspecified: Secondary | ICD-10-CM | POA: Diagnosis not present

## 2018-01-23 DIAGNOSIS — L409 Psoriasis, unspecified: Secondary | ICD-10-CM | POA: Diagnosis not present

## 2018-01-24 DIAGNOSIS — Z79899 Other long term (current) drug therapy: Secondary | ICD-10-CM | POA: Diagnosis not present

## 2018-01-24 DIAGNOSIS — L293 Anogenital pruritus, unspecified: Secondary | ICD-10-CM | POA: Diagnosis not present

## 2018-02-13 DIAGNOSIS — Z5181 Encounter for therapeutic drug level monitoring: Secondary | ICD-10-CM | POA: Diagnosis not present

## 2018-02-13 DIAGNOSIS — L400 Psoriasis vulgaris: Secondary | ICD-10-CM | POA: Diagnosis not present

## 2018-03-16 DIAGNOSIS — L309 Dermatitis, unspecified: Secondary | ICD-10-CM | POA: Diagnosis not present

## 2018-03-16 DIAGNOSIS — G4730 Sleep apnea, unspecified: Secondary | ICD-10-CM | POA: Diagnosis not present

## 2018-03-16 DIAGNOSIS — E782 Mixed hyperlipidemia: Secondary | ICD-10-CM | POA: Diagnosis not present

## 2018-03-16 DIAGNOSIS — N401 Enlarged prostate with lower urinary tract symptoms: Secondary | ICD-10-CM | POA: Diagnosis not present

## 2018-03-16 DIAGNOSIS — I10 Essential (primary) hypertension: Secondary | ICD-10-CM | POA: Diagnosis not present

## 2018-03-16 DIAGNOSIS — K219 Gastro-esophageal reflux disease without esophagitis: Secondary | ICD-10-CM | POA: Diagnosis not present

## 2018-03-16 DIAGNOSIS — Z0000 Encounter for general adult medical examination without abnormal findings: Secondary | ICD-10-CM | POA: Diagnosis not present

## 2018-03-16 DIAGNOSIS — E039 Hypothyroidism, unspecified: Secondary | ICD-10-CM | POA: Diagnosis not present

## 2018-03-16 DIAGNOSIS — H9201 Otalgia, right ear: Secondary | ICD-10-CM | POA: Diagnosis not present

## 2018-04-25 ENCOUNTER — Other Ambulatory Visit: Payer: Self-pay | Admitting: Dermatology

## 2018-04-25 DIAGNOSIS — L309 Dermatitis, unspecified: Secondary | ICD-10-CM | POA: Diagnosis not present

## 2018-04-25 DIAGNOSIS — L409 Psoriasis, unspecified: Secondary | ICD-10-CM | POA: Diagnosis not present

## 2018-04-25 DIAGNOSIS — D485 Neoplasm of uncertain behavior of skin: Secondary | ICD-10-CM | POA: Diagnosis not present

## 2018-05-09 DIAGNOSIS — L089 Local infection of the skin and subcutaneous tissue, unspecified: Secondary | ICD-10-CM | POA: Diagnosis not present

## 2018-05-09 DIAGNOSIS — Z79899 Other long term (current) drug therapy: Secondary | ICD-10-CM | POA: Diagnosis not present

## 2018-05-09 DIAGNOSIS — L408 Other psoriasis: Secondary | ICD-10-CM | POA: Diagnosis not present

## 2018-05-09 DIAGNOSIS — L303 Infective dermatitis: Secondary | ICD-10-CM | POA: Diagnosis not present

## 2018-05-28 DIAGNOSIS — J0140 Acute pansinusitis, unspecified: Secondary | ICD-10-CM | POA: Diagnosis not present

## 2018-05-28 DIAGNOSIS — H66003 Acute suppurative otitis media without spontaneous rupture of ear drum, bilateral: Secondary | ICD-10-CM | POA: Diagnosis not present

## 2018-07-04 DIAGNOSIS — Z23 Encounter for immunization: Secondary | ICD-10-CM | POA: Diagnosis not present

## 2018-10-23 DIAGNOSIS — L409 Psoriasis, unspecified: Secondary | ICD-10-CM | POA: Diagnosis not present

## 2018-10-24 DIAGNOSIS — L400 Psoriasis vulgaris: Secondary | ICD-10-CM | POA: Diagnosis not present

## 2018-10-24 DIAGNOSIS — Z5181 Encounter for therapeutic drug level monitoring: Secondary | ICD-10-CM | POA: Diagnosis not present

## 2018-10-25 DIAGNOSIS — G4730 Sleep apnea, unspecified: Secondary | ICD-10-CM | POA: Diagnosis not present

## 2018-10-25 DIAGNOSIS — E782 Mixed hyperlipidemia: Secondary | ICD-10-CM | POA: Diagnosis not present

## 2018-10-25 DIAGNOSIS — N401 Enlarged prostate with lower urinary tract symptoms: Secondary | ICD-10-CM | POA: Diagnosis not present

## 2018-10-25 DIAGNOSIS — J309 Allergic rhinitis, unspecified: Secondary | ICD-10-CM | POA: Diagnosis not present

## 2018-10-25 DIAGNOSIS — Z125 Encounter for screening for malignant neoplasm of prostate: Secondary | ICD-10-CM | POA: Diagnosis not present

## 2018-10-25 DIAGNOSIS — I10 Essential (primary) hypertension: Secondary | ICD-10-CM | POA: Diagnosis not present

## 2018-10-25 DIAGNOSIS — E039 Hypothyroidism, unspecified: Secondary | ICD-10-CM | POA: Diagnosis not present

## 2018-10-25 DIAGNOSIS — K219 Gastro-esophageal reflux disease without esophagitis: Secondary | ICD-10-CM | POA: Diagnosis not present

## 2019-01-17 DIAGNOSIS — Z79899 Other long term (current) drug therapy: Secondary | ICD-10-CM | POA: Diagnosis not present

## 2019-01-17 DIAGNOSIS — G47419 Narcolepsy without cataplexy: Secondary | ICD-10-CM | POA: Diagnosis not present

## 2019-01-17 DIAGNOSIS — E785 Hyperlipidemia, unspecified: Secondary | ICD-10-CM | POA: Diagnosis not present

## 2019-01-17 DIAGNOSIS — E669 Obesity, unspecified: Secondary | ICD-10-CM | POA: Diagnosis not present

## 2019-01-17 DIAGNOSIS — K219 Gastro-esophageal reflux disease without esophagitis: Secondary | ICD-10-CM | POA: Diagnosis not present

## 2019-01-17 DIAGNOSIS — E039 Hypothyroidism, unspecified: Secondary | ICD-10-CM | POA: Diagnosis not present

## 2019-01-17 DIAGNOSIS — Z7722 Contact with and (suspected) exposure to environmental tobacco smoke (acute) (chronic): Secondary | ICD-10-CM | POA: Diagnosis not present

## 2019-01-17 DIAGNOSIS — L4050 Arthropathic psoriasis, unspecified: Secondary | ICD-10-CM | POA: Diagnosis not present

## 2019-01-17 DIAGNOSIS — Z8349 Family history of other endocrine, nutritional and metabolic diseases: Secondary | ICD-10-CM | POA: Diagnosis not present

## 2019-01-17 DIAGNOSIS — I10 Essential (primary) hypertension: Secondary | ICD-10-CM | POA: Diagnosis not present

## 2019-02-19 DIAGNOSIS — L409 Psoriasis, unspecified: Secondary | ICD-10-CM | POA: Diagnosis not present

## 2019-02-19 DIAGNOSIS — L080 Pyoderma: Secondary | ICD-10-CM | POA: Diagnosis not present

## 2019-02-19 DIAGNOSIS — L089 Local infection of the skin and subcutaneous tissue, unspecified: Secondary | ICD-10-CM | POA: Diagnosis not present

## 2019-02-19 DIAGNOSIS — T887XXA Unspecified adverse effect of drug or medicament, initial encounter: Secondary | ICD-10-CM | POA: Diagnosis not present

## 2019-02-20 DIAGNOSIS — L400 Psoriasis vulgaris: Secondary | ICD-10-CM | POA: Diagnosis not present

## 2019-02-20 DIAGNOSIS — Z5181 Encounter for therapeutic drug level monitoring: Secondary | ICD-10-CM | POA: Diagnosis not present

## 2019-05-06 DIAGNOSIS — E039 Hypothyroidism, unspecified: Secondary | ICD-10-CM | POA: Diagnosis not present

## 2019-05-06 DIAGNOSIS — I10 Essential (primary) hypertension: Secondary | ICD-10-CM | POA: Diagnosis not present

## 2019-05-06 DIAGNOSIS — E782 Mixed hyperlipidemia: Secondary | ICD-10-CM | POA: Diagnosis not present

## 2019-05-06 DIAGNOSIS — N401 Enlarged prostate with lower urinary tract symptoms: Secondary | ICD-10-CM | POA: Diagnosis not present

## 2019-05-06 DIAGNOSIS — G4730 Sleep apnea, unspecified: Secondary | ICD-10-CM | POA: Diagnosis not present

## 2019-05-06 DIAGNOSIS — K219 Gastro-esophageal reflux disease without esophagitis: Secondary | ICD-10-CM | POA: Diagnosis not present

## 2019-05-06 DIAGNOSIS — J309 Allergic rhinitis, unspecified: Secondary | ICD-10-CM | POA: Diagnosis not present

## 2019-05-07 DIAGNOSIS — L409 Psoriasis, unspecified: Secondary | ICD-10-CM | POA: Diagnosis not present

## 2019-05-15 DIAGNOSIS — Z23 Encounter for immunization: Secondary | ICD-10-CM | POA: Diagnosis not present

## 2019-05-23 DIAGNOSIS — R609 Edema, unspecified: Secondary | ICD-10-CM | POA: Diagnosis not present

## 2019-06-18 DIAGNOSIS — H66002 Acute suppurative otitis media without spontaneous rupture of ear drum, left ear: Secondary | ICD-10-CM | POA: Diagnosis not present

## 2019-07-04 DIAGNOSIS — M5440 Lumbago with sciatica, unspecified side: Secondary | ICD-10-CM | POA: Diagnosis not present

## 2019-07-04 DIAGNOSIS — H663X1 Other chronic suppurative otitis media, right ear: Secondary | ICD-10-CM | POA: Diagnosis not present

## 2019-07-04 DIAGNOSIS — H6191 Disorder of right external ear, unspecified: Secondary | ICD-10-CM | POA: Diagnosis not present

## 2019-07-18 DIAGNOSIS — M79605 Pain in left leg: Secondary | ICD-10-CM | POA: Diagnosis not present

## 2019-07-18 DIAGNOSIS — S39012D Strain of muscle, fascia and tendon of lower back, subsequent encounter: Secondary | ICD-10-CM | POA: Diagnosis not present

## 2019-08-20 DIAGNOSIS — L509 Urticaria, unspecified: Secondary | ICD-10-CM | POA: Diagnosis not present

## 2019-08-20 DIAGNOSIS — L409 Psoriasis, unspecified: Secondary | ICD-10-CM | POA: Diagnosis not present

## 2019-09-24 ENCOUNTER — Other Ambulatory Visit: Payer: Self-pay | Admitting: Dermatology

## 2019-09-24 DIAGNOSIS — L409 Psoriasis, unspecified: Secondary | ICD-10-CM | POA: Diagnosis not present

## 2019-09-24 DIAGNOSIS — L308 Other specified dermatitis: Secondary | ICD-10-CM | POA: Diagnosis not present

## 2019-09-24 DIAGNOSIS — D485 Neoplasm of uncertain behavior of skin: Secondary | ICD-10-CM | POA: Diagnosis not present

## 2019-09-24 DIAGNOSIS — L270 Generalized skin eruption due to drugs and medicaments taken internally: Secondary | ICD-10-CM | POA: Diagnosis not present

## 2019-11-04 DIAGNOSIS — Z008 Encounter for other general examination: Secondary | ICD-10-CM | POA: Diagnosis not present

## 2019-11-04 DIAGNOSIS — Z0000 Encounter for general adult medical examination without abnormal findings: Secondary | ICD-10-CM | POA: Diagnosis not present

## 2019-11-04 DIAGNOSIS — E669 Obesity, unspecified: Secondary | ICD-10-CM | POA: Diagnosis not present

## 2019-11-04 DIAGNOSIS — N400 Enlarged prostate without lower urinary tract symptoms: Secondary | ICD-10-CM | POA: Diagnosis not present

## 2019-11-04 DIAGNOSIS — Z6832 Body mass index (BMI) 32.0-32.9, adult: Secondary | ICD-10-CM | POA: Diagnosis not present

## 2019-11-04 DIAGNOSIS — Z7722 Contact with and (suspected) exposure to environmental tobacco smoke (acute) (chronic): Secondary | ICD-10-CM | POA: Diagnosis not present

## 2019-11-04 DIAGNOSIS — E039 Hypothyroidism, unspecified: Secondary | ICD-10-CM | POA: Diagnosis not present

## 2019-11-04 DIAGNOSIS — J309 Allergic rhinitis, unspecified: Secondary | ICD-10-CM | POA: Diagnosis not present

## 2019-11-04 DIAGNOSIS — E785 Hyperlipidemia, unspecified: Secondary | ICD-10-CM | POA: Diagnosis not present

## 2019-11-04 DIAGNOSIS — I10 Essential (primary) hypertension: Secondary | ICD-10-CM | POA: Diagnosis not present

## 2019-11-04 DIAGNOSIS — I739 Peripheral vascular disease, unspecified: Secondary | ICD-10-CM | POA: Diagnosis not present

## 2019-11-04 DIAGNOSIS — N401 Enlarged prostate with lower urinary tract symptoms: Secondary | ICD-10-CM | POA: Diagnosis not present

## 2019-11-04 DIAGNOSIS — M79605 Pain in left leg: Secondary | ICD-10-CM | POA: Diagnosis not present

## 2019-11-04 DIAGNOSIS — K219 Gastro-esophageal reflux disease without esophagitis: Secondary | ICD-10-CM | POA: Diagnosis not present

## 2019-11-04 DIAGNOSIS — E782 Mixed hyperlipidemia: Secondary | ICD-10-CM | POA: Diagnosis not present

## 2019-11-04 DIAGNOSIS — G4730 Sleep apnea, unspecified: Secondary | ICD-10-CM | POA: Diagnosis not present

## 2019-11-18 DIAGNOSIS — L309 Dermatitis, unspecified: Secondary | ICD-10-CM | POA: Diagnosis not present

## 2019-11-18 DIAGNOSIS — Z872 Personal history of diseases of the skin and subcutaneous tissue: Secondary | ICD-10-CM | POA: Diagnosis not present

## 2019-11-18 DIAGNOSIS — L409 Psoriasis, unspecified: Secondary | ICD-10-CM | POA: Diagnosis not present

## 2019-11-20 DIAGNOSIS — M545 Low back pain: Secondary | ICD-10-CM | POA: Diagnosis not present

## 2019-11-20 DIAGNOSIS — M25552 Pain in left hip: Secondary | ICD-10-CM | POA: Diagnosis not present

## 2019-11-28 ENCOUNTER — Other Ambulatory Visit: Payer: Self-pay | Admitting: Dermatology

## 2019-11-29 MED ORDER — DOXEPIN HCL 25 MG PO CAPS: 25.0000 mg | ORAL_CAPSULE | Freq: Every day | ORAL | 3 refills | Status: DC

## 2019-11-29 NOTE — Telephone Encounter (Signed)
Per Dr. Denna Haggard okay to refill Doxepin

## 2019-11-29 NOTE — Addendum Note (Signed)
Addended by: Charna Elizabeth T on: 11/29/2019 01:51 PM   Modules accepted: Orders

## 2019-11-30 DIAGNOSIS — M5416 Radiculopathy, lumbar region: Secondary | ICD-10-CM | POA: Diagnosis not present

## 2019-12-06 DIAGNOSIS — M48062 Spinal stenosis, lumbar region with neurogenic claudication: Secondary | ICD-10-CM | POA: Diagnosis not present

## 2019-12-06 DIAGNOSIS — M5136 Other intervertebral disc degeneration, lumbar region: Secondary | ICD-10-CM | POA: Diagnosis not present

## 2019-12-06 DIAGNOSIS — M5416 Radiculopathy, lumbar region: Secondary | ICD-10-CM | POA: Diagnosis not present

## 2019-12-06 DIAGNOSIS — M545 Low back pain: Secondary | ICD-10-CM | POA: Diagnosis not present

## 2020-01-23 DIAGNOSIS — M5136 Other intervertebral disc degeneration, lumbar region: Secondary | ICD-10-CM | POA: Diagnosis not present

## 2020-02-03 DIAGNOSIS — R350 Frequency of micturition: Secondary | ICD-10-CM | POA: Diagnosis not present

## 2020-02-03 DIAGNOSIS — N401 Enlarged prostate with lower urinary tract symptoms: Secondary | ICD-10-CM | POA: Diagnosis not present

## 2020-02-03 DIAGNOSIS — N281 Cyst of kidney, acquired: Secondary | ICD-10-CM | POA: Diagnosis not present

## 2020-02-18 DIAGNOSIS — M5136 Other intervertebral disc degeneration, lumbar region: Secondary | ICD-10-CM | POA: Diagnosis not present

## 2020-02-19 DIAGNOSIS — L259 Unspecified contact dermatitis, unspecified cause: Secondary | ICD-10-CM | POA: Diagnosis not present

## 2020-02-19 DIAGNOSIS — L409 Psoriasis, unspecified: Secondary | ICD-10-CM | POA: Diagnosis not present

## 2020-02-19 DIAGNOSIS — L258 Unspecified contact dermatitis due to other agents: Secondary | ICD-10-CM | POA: Diagnosis not present

## 2020-02-19 DIAGNOSIS — Z872 Personal history of diseases of the skin and subcutaneous tissue: Secondary | ICD-10-CM | POA: Diagnosis not present

## 2020-02-21 DIAGNOSIS — L409 Psoriasis, unspecified: Secondary | ICD-10-CM | POA: Diagnosis not present

## 2020-02-21 DIAGNOSIS — Z5181 Encounter for therapeutic drug level monitoring: Secondary | ICD-10-CM | POA: Diagnosis not present

## 2020-02-26 DIAGNOSIS — R351 Nocturia: Secondary | ICD-10-CM | POA: Diagnosis not present

## 2020-03-02 DIAGNOSIS — R351 Nocturia: Secondary | ICD-10-CM | POA: Diagnosis not present

## 2020-03-02 DIAGNOSIS — R350 Frequency of micturition: Secondary | ICD-10-CM | POA: Diagnosis not present

## 2020-03-02 DIAGNOSIS — N401 Enlarged prostate with lower urinary tract symptoms: Secondary | ICD-10-CM | POA: Diagnosis not present

## 2020-03-02 DIAGNOSIS — N281 Cyst of kidney, acquired: Secondary | ICD-10-CM | POA: Diagnosis not present

## 2020-03-03 DIAGNOSIS — M545 Low back pain: Secondary | ICD-10-CM | POA: Diagnosis not present

## 2020-03-03 DIAGNOSIS — M48062 Spinal stenosis, lumbar region with neurogenic claudication: Secondary | ICD-10-CM | POA: Diagnosis not present

## 2020-03-03 DIAGNOSIS — M5416 Radiculopathy, lumbar region: Secondary | ICD-10-CM | POA: Diagnosis not present

## 2020-03-06 ENCOUNTER — Other Ambulatory Visit: Payer: Self-pay | Admitting: Urology

## 2020-03-06 DIAGNOSIS — N281 Cyst of kidney, acquired: Secondary | ICD-10-CM

## 2020-03-09 DIAGNOSIS — R600 Localized edema: Secondary | ICD-10-CM | POA: Diagnosis not present

## 2020-03-09 DIAGNOSIS — Z01818 Encounter for other preprocedural examination: Secondary | ICD-10-CM | POA: Diagnosis not present

## 2020-03-09 DIAGNOSIS — M545 Low back pain: Secondary | ICD-10-CM | POA: Diagnosis not present

## 2020-03-23 DIAGNOSIS — R600 Localized edema: Secondary | ICD-10-CM | POA: Diagnosis not present

## 2020-03-23 DIAGNOSIS — Z01818 Encounter for other preprocedural examination: Secondary | ICD-10-CM | POA: Diagnosis not present

## 2020-03-23 DIAGNOSIS — I10 Essential (primary) hypertension: Secondary | ICD-10-CM | POA: Diagnosis not present

## 2020-03-27 DIAGNOSIS — Z01818 Encounter for other preprocedural examination: Secondary | ICD-10-CM | POA: Diagnosis not present

## 2020-04-01 ENCOUNTER — Other Ambulatory Visit: Payer: Medicare HMO

## 2020-04-07 ENCOUNTER — Ambulatory Visit: Payer: Self-pay | Admitting: Orthopedic Surgery

## 2020-04-10 DIAGNOSIS — I10 Essential (primary) hypertension: Secondary | ICD-10-CM | POA: Diagnosis not present

## 2020-04-10 DIAGNOSIS — E782 Mixed hyperlipidemia: Secondary | ICD-10-CM | POA: Diagnosis not present

## 2020-04-10 DIAGNOSIS — E039 Hypothyroidism, unspecified: Secondary | ICD-10-CM | POA: Diagnosis not present

## 2020-04-10 DIAGNOSIS — J449 Chronic obstructive pulmonary disease, unspecified: Secondary | ICD-10-CM | POA: Diagnosis not present

## 2020-04-10 DIAGNOSIS — N401 Enlarged prostate with lower urinary tract symptoms: Secondary | ICD-10-CM | POA: Diagnosis not present

## 2020-04-17 NOTE — Pre-Procedure Instructions (Signed)
[facility] - [place], [place] - [address] [place] [address] Phone: [phone] Fax: [phone]      Your procedure is scheduled on Thursday September 9th.  Report to [facility] Main Entrance "A" at 5:30 A.M., and check in at the Admitting office.  Call this number if you have problems the morning of surgery:  [phone]  Call [phone] if you have any questions prior to your surgery date Monday-Friday 8am-4pm    Remember:  Do not eat after midnight the night before your surgery  You may drink clear liquids until 4:30am the morning of your surgery.   Clear liquids allowed are: Water, Non-Citrus Juices (without pulp), Carbonated Beverages, Clear Tea, Black Coffee Only, and Gatorade   Enhanced Recovery after Surgery for Orthopedics Enhanced Recovery after Surgery is a protocol used to improve the stress on your body and your recovery after surgery.  Patient Instructions  . The night before surgery:  o No food after midnight. ONLY clear liquids after midnight  .  Marland Kitchen The day of surgery (if you do NOT have diabetes):  o Drink ONE (1) Pre-Surgery Clear Ensure by ___4:30__ am the morning of surgery   o This drink was given to you during your hospital  pre-op appointment visit. o Nothing else to drink after completing the  Pre-Surgery Clear Ensure.         If you have questions, please contact your surgeon's office.   Take these medicines the morning of surgery with A SIP OF WATER   finasteride (PROSCAR) 5 MG tablet  levothyroxine (SYNTHROID, LEVOTHROID) 112 MCG tablet  modafinil (PROVIGIL) 200 MG tablet  omeprazole (PRILOSEC OTC) 20 MG tablet   As of today, STOP taking any Aspirin (unless otherwise instructed by your surgeon) Aleve, Naproxen, Ibuprofen, Motrin, Advil, Goody's, BC's, all herbal medications, fish oil, and all vitamins.                      Do not wear jewelry            Do not wear  lotions, powders,colognes, or deodorant.            Do not shave 48 hours prior to surgery.  Men may shave face and neck.            Do not bring valuables to the hospital.            [facility] is not responsible for any belongings or valuables.  Do NOT Smoke (Tobacco/Vaping) or drink Alcohol 24 hours prior to your procedure If you use a CPAP at night, you may bring all equipment for your overnight stay.   Contacts, glasses, dentures or bridgework may not be worn into surgery.      For patients admitted to the hospital, discharge time will be determined by your treatment team.   Patients discharged the day of surgery will not be allowed to drive home, and someone needs to stay with them for 24 hours.    Special instructions:   [place]- Preparing For Surgery  Before surgery, you can play an important role. Because skin is not sterile, your skin needs to be as free of germs as possible. You can reduce the number of germs on your skin by washing with CHG (chlorahexidine gluconate) Soap before surgery.  CHG is an antiseptic cleaner which kills germs and bonds with the skin to continue killing germs even after washing.  Oral Hygiene is also important to reduce your risk of infection.  Remember - BRUSH YOUR TEETH THE MORNING OF SURGERY WITH YOUR REGULAR TOOTHPASTE  Please do not use if you have an allergy to CHG or antibacterial soaps. If your skin becomes reddened/irritated stop using the CHG.  Do not shave (including legs and underarms) for at least 48 hours prior to first CHG shower. It is OK to shave your face.  Please follow these instructions carefully.   1. Shower the NIGHT BEFORE SURGERY and the MORNING OF SURGERY with CHG Soap.   2. If you chose to wash your hair, wash your hair first as usual with your normal shampoo.  3. After you shampoo, rinse your hair and body thoroughly to remove the shampoo.  4. Use CHG as you would any other liquid soap. You can apply CHG  directly to the skin and wash gently with a scrungie or a clean washcloth.   5. Apply the CHG Soap to your body ONLY FROM THE NECK DOWN.  Do not use on open wounds or open sores. Avoid contact with your eyes, ears, mouth and genitals (private parts). Wash Face and genitals (private parts)  with your normal soap.   6. Wash thoroughly, paying special attention to the area where your surgery will be performed.  7. Thoroughly rinse your body with warm water from the neck down.  8. DO NOT shower/wash with your normal soap after using and rinsing off the CHG Soap.  9. Pat yourself dry with a CLEAN TOWEL.  10. Wear CLEAN PAJAMAS to bed the night before surgery  11. Place CLEAN SHEETS on your bed the night of your first shower and DO NOT SLEEP WITH PETS.   Day of Surgery: Wear Clean/Comfortable clothing the morning of surgery Do not apply any deodorants/lotions.   Remember to brush your teeth WITH YOUR REGULAR TOOTHPASTE.   Please read over the following fact sheets that you were given.

## 2020-04-21 ENCOUNTER — Ambulatory Visit (HOSPITAL_COMMUNITY)
Admission: RE | Admit: 2020-04-21 | Discharge: 2020-04-21 | Disposition: A | Discharge status: Home / Self Care | Payer: Medicare HMO | Source: Ambulatory Visit | Attending: Orthopedic Surgery | Admitting: Orthopedic Surgery

## 2020-04-21 ENCOUNTER — Encounter (HOSPITAL_COMMUNITY)
Admission: RE | Admit: 2020-04-21 | Discharge: 2020-04-21 | Disposition: A | Discharge status: Home / Self Care | Payer: Medicare HMO | Source: Ambulatory Visit | Attending: Specialist | Admitting: Specialist

## 2020-04-21 ENCOUNTER — Other Ambulatory Visit: Payer: Self-pay

## 2020-04-21 ENCOUNTER — Encounter (HOSPITAL_COMMUNITY): Payer: Self-pay

## 2020-04-21 ENCOUNTER — Other Ambulatory Visit (HOSPITAL_COMMUNITY)
Admission: RE | Admit: 2020-04-21 | Discharge: 2020-04-21 | Disposition: A | Discharge status: Home / Self Care | Payer: Medicare HMO | Source: Ambulatory Visit | Attending: Specialist | Admitting: Specialist

## 2020-04-21 DIAGNOSIS — M5126 Other intervertebral disc displacement, lumbar region: Secondary | ICD-10-CM

## 2020-04-21 DIAGNOSIS — Z01812 Encounter for preprocedural laboratory examination: Secondary | ICD-10-CM | POA: Insufficient documentation

## 2020-04-21 DIAGNOSIS — Z20822 Contact with and (suspected) exposure to covid-19: Secondary | ICD-10-CM | POA: Insufficient documentation

## 2020-04-21 DIAGNOSIS — M545 Low back pain: Secondary | ICD-10-CM | POA: Diagnosis not present

## 2020-04-21 HISTORY — DX: Unspecified osteoarthritis, unspecified site: M19.90

## 2020-04-21 LAB — BASIC METABOLIC PANEL
Anion gap: 11 (ref 5–15)
BUN: 6 mg/dL — ABNORMAL LOW (ref 8–23)
CO2: 29 mmol/L (ref 22–32)
Calcium: 9.6 mg/dL (ref 8.9–10.3)
Chloride: 93 mmol/L — ABNORMAL LOW (ref 98–111)
Creatinine, Ser: 0.76 mg/dL (ref 0.61–1.24)
GFR calc Af Amer: >60 mL/min (ref >60)
GFR calc non Af Amer: >60 mL/min (ref >60)
Glucose, Bld: 93 mg/dL (ref 70–99)
Potassium: 3.4 mmol/L — ABNORMAL LOW (ref 3.5–5.1)
Sodium: 133 mmol/L — ABNORMAL LOW (ref 135–145)

## 2020-04-21 LAB — CBC
HCT: 38 % — ABNORMAL LOW (ref 39.0–52.0)
Hemoglobin: 12.4 g/dL — ABNORMAL LOW (ref 13.0–17.0)
MCH: 32.3 pg (ref 26.0–34.0)
MCHC: 32.6 g/dL (ref 30.0–36.0)
MCV: 99 fL (ref 80.0–100.0)
Platelets: 181 10*3/uL (ref 150–400)
RBC: 3.84 MIL/uL — ABNORMAL LOW (ref 4.22–5.81)
RDW: 13.2 % (ref 11.5–15.5)
WBC: 6.4 10*3/uL (ref 4.0–10.5)
nRBC: 0 % (ref 0.0–0.2)

## 2020-04-21 LAB — SARS CORONAVIRUS 2 (TAT 6-24 HRS): SARS Coronavirus 2: NEGATIVE

## 2020-04-21 LAB — SURGICAL PCR SCREEN
MRSA, PCR: NEGATIVE
Staphylococcus aureus: NEGATIVE

## 2020-04-21 NOTE — Progress Notes (Signed)
PCP - Dr. Orpah Melter Cardiologist - Denies  PPM/ICD - Denies  Chest x-ray - N/A Lumbar x-ray - 04/21/20 EKG - 04/21/20 Stress Test - Denies ECHO - Denies Cardiac Cath - Denies  Sleep Study - Yes CPAP - No  Pt denies being diabetic  Blood Thinner Instructions: N/A Aspirin Instructions: N/A  ERAS Protcol - Yes, water  COVID TEST- 04/21/20   Anesthesia review: Yes, abnormal EKG  Patient denies shortness of breath, fever, cough and chest pain at PAT appointment   All instructions explained to the patient, with a verbal understanding of the material. Patient agrees to go over the instructions while at home for a better understanding. Patient also instructed to self quarantine after being tested for COVID-19. The opportunity to ask questions was provided.

## 2020-04-21 NOTE — Pre-Procedure Instructions (Signed)
[facility] - [place], [place] - [address] [place] [address] Phone: [phone] Fax: [phone]      Your procedure is scheduled on Thursday September 9th.  Report to [facility] Main Entrance "A" at 5:30 A.M., and check in at the Admitting office.  Call this number if you have problems the morning of surgery:  [phone]  Call [phone] if you have any questions prior to your surgery date Monday-Friday 8am-4pm    Remember:  Do not eat after midnight the night before your surgery  You may drink clear liquids until 4:30am the morning of your surgery.   Clear liquids allowed are: Water, Non-Citrus Juices (without pulp), Carbonated Beverages, Clear Tea, Black Coffee Only, and Gatorade   Enhanced Recovery after Surgery for Orthopedics Enhanced Recovery after Surgery is a protocol used to improve the stress on your body and your recovery after surgery.  Patient Instructions  . The night before surgery:  o No food after midnight. ONLY clear liquids after midnight  .  Marland Kitchen The day of surgery (if you do NOT have diabetes):  o Drink ONE (1) Pre-Surgery Clear Ensure by ___4:30__ am the morning of surgery   o This drink was given to you during your hospital  pre-op appointment visit. o Nothing else to drink after completing the  Pre-Surgery Clear Ensure.         If you have questions, please contact your surgeon's office.   Take these medicines the morning of surgery with A SIP OF WATER   finasteride (PROSCAR) 5 MG tablet  levothyroxine (SYNTHROID, LEVOTHROID) 112 MCG tablet  modafinil (PROVIGIL) 200 MG tablet  omeprazole (PRILOSEC OTC) 20 MG tablet  rosuvastatin (CRESTOR)  tamsulosin (FLOMAX)   As of today, STOP taking any Aspirin (unless otherwise instructed by your surgeon) Aleve, Naproxen, Ibuprofen, Motrin, Advil, Goody's, BC's, all herbal medications, fish oil, and all vitamins.                      Do  not wear jewelry            Do not wear lotions, powders,colognes, or deodorant.            Do not shave 48 hours prior to surgery.  Men may shave face and neck.            Do not bring valuables to the hospital.            [facility] is not responsible for any belongings or valuables.  Do NOT Smoke (Tobacco/Vaping) or drink Alcohol 24 hours prior to your procedure If you use a CPAP at night, you may bring all equipment for your overnight stay.   Contacts, glasses, dentures or bridgework may not be worn into surgery.      For patients admitted to the hospital, discharge time will be determined by your treatment team.   Patients discharged the day of surgery will not be allowed to drive home, and someone needs to stay with them for 24 hours.    Special instructions:   [place]- Preparing For Surgery  Before surgery, you can play an important role. Because skin is not sterile, your skin needs to be as free of germs as possible. You can reduce the number of germs on your skin by washing with CHG (chlorahexidine gluconate) Soap before surgery.  CHG is an antiseptic cleaner which kills germs and bonds with the skin to  continue killing germs even after washing.    Oral Hygiene is also important to reduce your risk of infection.  Remember - BRUSH YOUR TEETH THE MORNING OF SURGERY WITH YOUR REGULAR TOOTHPASTE  Please do not use if you have an allergy to CHG or antibacterial soaps. If your skin becomes reddened/irritated stop using the CHG.  Do not shave (including legs and underarms) for at least 48 hours prior to first CHG shower. It is OK to shave your face.  Please follow these instructions carefully.   1. Shower the NIGHT BEFORE SURGERY and the MORNING OF SURGERY with CHG Soap.   2. If you chose to wash your hair, wash your hair first as usual with your normal shampoo.  3. After you shampoo, rinse your hair and body thoroughly to remove the shampoo.  4. Use CHG as you would any  other liquid soap. You can apply CHG directly to the skin and wash gently with a scrungie or a clean washcloth.   5. Apply the CHG Soap to your body ONLY FROM THE NECK DOWN.  Do not use on open wounds or open sores. Avoid contact with your eyes, ears, mouth and genitals (private parts). Wash Face and genitals (private parts)  with your normal soap.   6. Wash thoroughly, paying special attention to the area where your surgery will be performed.  7. Thoroughly rinse your body with warm water from the neck down.  8. DO NOT shower/wash with your normal soap after using and rinsing off the CHG Soap.  9. Pat yourself dry with a CLEAN TOWEL.  10. Wear CLEAN PAJAMAS to bed the night before surgery  11. Place CLEAN SHEETS on your bed the night of your first shower and DO NOT SLEEP WITH PETS.   Day of Surgery: Wear Clean/Comfortable clothing the morning of surgery Do not apply any deodorants/lotions.   Remember to brush your teeth WITH YOUR REGULAR TOOTHPASTE.   Please read over the following fact sheets that you were given.

## 2020-04-22 NOTE — Anesthesia Preprocedure Evaluation (Addendum)
Anesthesia Evaluation  Patient identified by MRN, date of birth, ID band Patient awake    Reviewed: Allergy & Precautions, NPO status , Patient's Chart, lab work & pertinent test results  Airway Mallampati: II  TM Distance: >3 FB     Dental   Pulmonary former smoker,    breath sounds clear to auscultation       Cardiovascular hypertension, + Peripheral Vascular Disease  + dysrhythmias  Rhythm:Regular Rate:Normal     Neuro/Psych    GI/Hepatic Neg liver ROS, GERD  ,  Endo/Other  Hypothyroidism   Renal/GU negative Renal ROS     Musculoskeletal   Abdominal   Peds  Hematology   Anesthesia Other Findings   Reproductive/Obstetrics                            Anesthesia Physical Anesthesia Plan  ASA: III  Anesthesia Plan: General   Post-op Pain Management:    Induction: Intravenous  PONV Risk Score and Plan: 2 and Ondansetron, Dexamethasone and Midazolam  Airway Management Planned: Oral ETT  Additional Equipment:   Intra-op Plan:   Post-operative Plan: Possible Post-op intubation/ventilation  Informed Consent: I have reviewed the patients History and Physical, chart, labs and discussed the procedure including the risks, benefits and alternatives for the proposed anesthesia with the patient or authorized representative who has indicated his/her understanding and acceptance.     Dental advisory given  Plan Discussed with: Anesthesiologist and CRNA  Anesthesia Plan Comments:        Anesthesia Quick Evaluation

## 2020-04-23 ENCOUNTER — Other Ambulatory Visit: Payer: Self-pay

## 2020-04-23 ENCOUNTER — Encounter (HOSPITAL_COMMUNITY): Payer: Self-pay | Admitting: Specialist

## 2020-04-23 ENCOUNTER — Ambulatory Visit (HOSPITAL_COMMUNITY): Payer: Medicare HMO | Admitting: Certified Registered"

## 2020-04-23 ENCOUNTER — Encounter (HOSPITAL_COMMUNITY)
Admission: RE | Disposition: A | Discharge status: Home / Self Care | Payer: Self-pay | Source: Home / Self Care | Attending: Specialist

## 2020-04-23 ENCOUNTER — Ambulatory Visit (HOSPITAL_COMMUNITY)
Admission: RE | Admit: 2020-04-23 | Discharge: 2020-04-24 | Disposition: A | Discharge status: Home / Self Care | Payer: Medicare HMO | Attending: Specialist | Admitting: Specialist

## 2020-04-23 ENCOUNTER — Ambulatory Visit (HOSPITAL_COMMUNITY): Payer: Medicare HMO

## 2020-04-23 ENCOUNTER — Ambulatory Visit (HOSPITAL_COMMUNITY): Payer: Medicare HMO | Admitting: Vascular Surgery

## 2020-04-23 DIAGNOSIS — Z9049 Acquired absence of other specified parts of digestive tract: Secondary | ICD-10-CM | POA: Insufficient documentation

## 2020-04-23 DIAGNOSIS — M1990 Unspecified osteoarthritis, unspecified site: Secondary | ICD-10-CM | POA: Insufficient documentation

## 2020-04-23 DIAGNOSIS — G4730 Sleep apnea, unspecified: Secondary | ICD-10-CM | POA: Insufficient documentation

## 2020-04-23 DIAGNOSIS — I739 Peripheral vascular disease, unspecified: Secondary | ICD-10-CM | POA: Diagnosis not present

## 2020-04-23 DIAGNOSIS — Z981 Arthrodesis status: Secondary | ICD-10-CM | POA: Diagnosis not present

## 2020-04-23 DIAGNOSIS — I10 Essential (primary) hypertension: Secondary | ICD-10-CM | POA: Diagnosis not present

## 2020-04-23 DIAGNOSIS — E785 Hyperlipidemia, unspecified: Secondary | ICD-10-CM | POA: Diagnosis not present

## 2020-04-23 DIAGNOSIS — Z79899 Other long term (current) drug therapy: Secondary | ICD-10-CM | POA: Diagnosis not present

## 2020-04-23 DIAGNOSIS — Z79890 Hormone replacement therapy (postmenopausal): Secondary | ICD-10-CM | POA: Diagnosis not present

## 2020-04-23 DIAGNOSIS — M48061 Spinal stenosis, lumbar region without neurogenic claudication: Secondary | ICD-10-CM | POA: Diagnosis not present

## 2020-04-23 DIAGNOSIS — M47816 Spondylosis without myelopathy or radiculopathy, lumbar region: Secondary | ICD-10-CM | POA: Diagnosis not present

## 2020-04-23 DIAGNOSIS — Z87891 Personal history of nicotine dependence: Secondary | ICD-10-CM | POA: Diagnosis not present

## 2020-04-23 DIAGNOSIS — K219 Gastro-esophageal reflux disease without esophagitis: Secondary | ICD-10-CM | POA: Insufficient documentation

## 2020-04-23 DIAGNOSIS — M47817 Spondylosis without myelopathy or radiculopathy, lumbosacral region: Secondary | ICD-10-CM | POA: Diagnosis not present

## 2020-04-23 DIAGNOSIS — M4807 Spinal stenosis, lumbosacral region: Secondary | ICD-10-CM | POA: Insufficient documentation

## 2020-04-23 DIAGNOSIS — Z419 Encounter for procedure for purposes other than remedying health state, unspecified: Secondary | ICD-10-CM

## 2020-04-23 DIAGNOSIS — E039 Hypothyroidism, unspecified: Secondary | ICD-10-CM | POA: Insufficient documentation

## 2020-04-23 HISTORY — PX: LUMBAR LAMINECTOMY/DECOMPRESSION MICRODISCECTOMY: SHX5026

## 2020-04-23 SURGERY — LUMBAR LAMINECTOMY/DECOMPRESSION MICRODISCECTOMY 2 LEVELS
Anesthesia: General | Site: Spine Lumbar | Laterality: Left

## 2020-04-23 MED ORDER — LIDOCAINE 2% (20 MG/ML) 5 ML SYRINGE
INTRAMUSCULAR | Status: DC | PRN
  Administered 2020-04-23: 40 mg via INTRAVENOUS

## 2020-04-23 MED ORDER — ALUM & MAG HYDROXIDE-SIMETH 200-200-20 MG/5ML PO SUSP: 30.0000 mL | Freq: Four times a day (QID) | ORAL | Status: DC | PRN

## 2020-04-23 MED ORDER — TAMSULOSIN HCL 0.4 MG PO CAPS
0.8000 mg | ORAL_CAPSULE | Freq: Every day | ORAL | Status: DC
  Administered 2020-04-23: 0.8 mg via ORAL
  Filled 2020-04-23: qty 2

## 2020-04-23 MED ORDER — OXYCODONE HCL 5 MG PO TABS: 10.0000 mg | ORAL_TABLET | ORAL | Status: DC | PRN

## 2020-04-23 MED ORDER — FOLIC ACID 1 MG PO TABS
1.0000 mg | ORAL_TABLET | Freq: Every day | ORAL | Status: DC
  Administered 2020-04-23: 1 mg via ORAL
  Filled 2020-04-23: qty 1

## 2020-04-23 MED ORDER — BUPIVACAINE-EPINEPHRINE 0.5% -1:200000 IJ SOLN
INTRAMUSCULAR | Status: AC
  Filled 2020-04-23: qty 1

## 2020-04-23 MED ORDER — HYDROMORPHONE HCL 1 MG/ML IJ SOLN
INTRAMUSCULAR | Status: AC
Start: 2020-04-23 — End: 2020-04-23
  Filled 2020-04-23: qty 1

## 2020-04-23 MED ORDER — ASCORBIC ACID 500 MG PO TABS
500.0000 mg | ORAL_TABLET | Freq: Every day | ORAL | Status: DC
  Administered 2020-04-23: 500 mg via ORAL
  Filled 2020-04-23: qty 1

## 2020-04-23 MED ORDER — SODIUM CHLORIDE 0.9 % IV SOLN
INTRAVENOUS | Status: DC | PRN
  Administered 2020-04-23: 500 mL

## 2020-04-23 MED ORDER — FENTANYL CITRATE (PF) 100 MCG/2ML IJ SOLN
INTRAMUSCULAR | Status: DC | PRN
Start: 2020-04-23 — End: 2020-04-23
  Administered 2020-04-23 (×6): 50 ug via INTRAVENOUS
  Administered 2020-04-23: 100 ug via INTRAVENOUS

## 2020-04-23 MED ORDER — HYDROCHLOROTHIAZIDE 25 MG PO TABS
25.0000 mg | ORAL_TABLET | Freq: Every day | ORAL | Status: DC
  Filled 2020-04-23: qty 1

## 2020-04-23 MED ORDER — EPHEDRINE SULFATE-NACL 50-0.9 MG/10ML-% IV SOSY
PREFILLED_SYRINGE | INTRAVENOUS | Status: DC | PRN
  Administered 2020-04-23: 10 mg via INTRAVENOUS

## 2020-04-23 MED ORDER — ONDANSETRON HCL 4 MG/2ML IJ SOLN
INTRAMUSCULAR | Status: DC | PRN
  Administered 2020-04-23: 4 mg via INTRAVENOUS

## 2020-04-23 MED ORDER — LACTATED RINGERS IV SOLN: INTRAVENOUS | Status: DC

## 2020-04-23 MED ORDER — ACETAMINOPHEN 10 MG/ML IV SOLN
1000.0000 mg | INTRAVENOUS | Status: AC
  Administered 2020-04-23: 1000 mg via INTRAVENOUS
  Filled 2020-04-23: qty 100

## 2020-04-23 MED ORDER — PHENOL 1.4 % MT LIQD: 1.0000 | OROMUCOSAL | Status: DC | PRN

## 2020-04-23 MED ORDER — LIDOCAINE 2% (20 MG/ML) 5 ML SYRINGE
INTRAMUSCULAR | Status: AC
  Filled 2020-04-23: qty 5

## 2020-04-23 MED ORDER — VITAMIN B-12 1000 MCG PO TABS
1000.0000 ug | ORAL_TABLET | Freq: Every day | ORAL | Status: DC
  Administered 2020-04-23: 1000 ug via ORAL
  Filled 2020-04-23: qty 1

## 2020-04-23 MED ORDER — ESMOLOL HCL 100 MG/10ML IV SOLN
INTRAVENOUS | Status: DC | PRN
  Administered 2020-04-23: 30 mg via INTRAVENOUS

## 2020-04-23 MED ORDER — SODIUM CHLORIDE 0.9 % IV SOLN
INTRAVENOUS | Status: DC | PRN
  Administered 2020-04-23: 20 ug/min via INTRAVENOUS

## 2020-04-23 MED ORDER — ROCURONIUM BROMIDE 10 MG/ML (PF) SYRINGE
PREFILLED_SYRINGE | INTRAVENOUS | Status: AC
  Filled 2020-04-23: qty 10

## 2020-04-23 MED ORDER — DEXAMETHASONE SODIUM PHOSPHATE 4 MG/ML IJ SOLN
INTRAMUSCULAR | Status: DC | PRN
  Administered 2020-04-23: 10 mg via INTRAVENOUS

## 2020-04-23 MED ORDER — PROPOFOL 10 MG/ML IV BOLUS
INTRAVENOUS | Status: AC
  Filled 2020-04-23: qty 20

## 2020-04-23 MED ORDER — ORAL CARE MOUTH RINSE: 15.0000 mL | Freq: Once | OROMUCOSAL | Status: AC

## 2020-04-23 MED ORDER — KCL IN DEXTROSE-NACL 20-5-0.45 MEQ/L-%-% IV SOLN: INTRAVENOUS | Status: AC

## 2020-04-23 MED ORDER — ONDANSETRON HCL 4 MG/2ML IJ SOLN
INTRAMUSCULAR | Status: AC
  Filled 2020-04-23: qty 2

## 2020-04-23 MED ORDER — MODAFINIL 100 MG PO TABS: 200.0000 mg | ORAL_TABLET | Freq: Every day | ORAL | Status: DC

## 2020-04-23 MED ORDER — CEFAZOLIN SODIUM-DEXTROSE 2-4 GM/100ML-% IV SOLN
2.0000 g | INTRAVENOUS | Status: AC
  Administered 2020-04-23: 2 g via INTRAVENOUS
  Filled 2020-04-23: qty 100

## 2020-04-23 MED ORDER — METHOCARBAMOL 1000 MG/10ML IJ SOLN
500.0000 mg | Freq: Four times a day (QID) | INTRAVENOUS | Status: DC | PRN
  Filled 2020-04-23: qty 5

## 2020-04-23 MED ORDER — FINASTERIDE 5 MG PO TABS: 5.0000 mg | ORAL_TABLET | Freq: Every day | ORAL | Status: DC

## 2020-04-23 MED ORDER — OXYCODONE HCL 5 MG PO TABS
5.0000 mg | ORAL_TABLET | ORAL | Status: DC | PRN
  Administered 2020-04-23 – 2020-04-24 (×2): 5 mg via ORAL
  Filled 2020-04-23 (×2): qty 1

## 2020-04-23 MED ORDER — LEVOTHYROXINE SODIUM 112 MCG PO TABS
112.0000 ug | ORAL_TABLET | Freq: Every day | ORAL | Status: DC
  Administered 2020-04-24: 112 ug via ORAL
  Filled 2020-04-23: qty 1

## 2020-04-23 MED ORDER — ALBUMIN HUMAN 5 % IV SOLN: INTRAVENOUS | Status: DC | PRN

## 2020-04-23 MED ORDER — SUGAMMADEX SODIUM 200 MG/2ML IV SOLN
INTRAVENOUS | Status: DC | PRN
  Administered 2020-04-23: 200 mg via INTRAVENOUS

## 2020-04-23 MED ORDER — BISACODYL 5 MG PO TBEC: 5.0000 mg | DELAYED_RELEASE_TABLET | Freq: Every day | ORAL | Status: DC | PRN

## 2020-04-23 MED ORDER — ACETAMINOPHEN 325 MG PO TABS
650.0000 mg | ORAL_TABLET | ORAL | Status: DC | PRN
  Administered 2020-04-24: 650 mg via ORAL
  Filled 2020-04-23: qty 2

## 2020-04-23 MED ORDER — POLYETHYLENE GLYCOL 3350 17 G PO PACK: 17.0000 g | PACK | Freq: Every day | ORAL | Status: DC | PRN

## 2020-04-23 MED ORDER — CLINDAMYCIN PHOSPHATE 600 MG/50ML IV SOLN
INTRAVENOUS | Status: DC | PRN
  Administered 2020-04-23: 600 mg via INTRAVENOUS

## 2020-04-23 MED ORDER — ZINC SULFATE 220 (50 ZN) MG PO CAPS
220.0000 mg | ORAL_CAPSULE | Freq: Every day | ORAL | Status: DC
  Administered 2020-04-23: 220 mg via ORAL
  Filled 2020-04-23 (×2): qty 1

## 2020-04-23 MED ORDER — ONDANSETRON HCL 4 MG PO TABS: 4.0000 mg | ORAL_TABLET | Freq: Four times a day (QID) | ORAL | Status: DC | PRN

## 2020-04-23 MED ORDER — LORATADINE 10 MG PO TABS
5.0000 mg | ORAL_TABLET | Freq: Every day | ORAL | Status: DC
  Administered 2020-04-23: 5 mg via ORAL
  Filled 2020-04-23: qty 1

## 2020-04-23 MED ORDER — ACETAMINOPHEN 650 MG RE SUPP: 650.0000 mg | RECTAL | Status: DC | PRN

## 2020-04-23 MED ORDER — MENTHOL 3 MG MT LOZG: 1.0000 | LOZENGE | OROMUCOSAL | Status: DC | PRN

## 2020-04-23 MED ORDER — METHOCARBAMOL 500 MG PO TABS
ORAL_TABLET | ORAL | Status: AC
  Filled 2020-04-23: qty 1

## 2020-04-23 MED ORDER — FENTANYL CITRATE (PF) 100 MCG/2ML IJ SOLN
INTRAMUSCULAR | Status: AC
Start: 2020-04-23 — End: 2020-04-23
  Filled 2020-04-23: qty 2

## 2020-04-23 MED ORDER — CEFAZOLIN SODIUM-DEXTROSE 2-4 GM/100ML-% IV SOLN
2.0000 g | Freq: Three times a day (TID) | INTRAVENOUS | Status: AC
  Administered 2020-04-23 (×2): 2 g via INTRAVENOUS
  Filled 2020-04-23 (×2): qty 100

## 2020-04-23 MED ORDER — THROMBIN 20000 UNITS EX SOLR
CUTANEOUS | Status: AC
  Filled 2020-04-23: qty 20000

## 2020-04-23 MED ORDER — TRIAMCINOLONE ACETONIDE 0.1 % EX OINT
1.0000 "application " | TOPICAL_OINTMENT | Freq: Two times a day (BID) | CUTANEOUS | Status: DC
  Administered 2020-04-23: 1 via TOPICAL
  Filled 2020-04-23: qty 15

## 2020-04-23 MED ORDER — METHOCARBAMOL 500 MG PO TABS
500.0000 mg | ORAL_TABLET | Freq: Four times a day (QID) | ORAL | Status: DC | PRN
  Administered 2020-04-23 – 2020-04-24 (×3): 500 mg via ORAL
  Filled 2020-04-23 (×2): qty 1

## 2020-04-23 MED ORDER — DOCUSATE SODIUM 100 MG PO CAPS
100.0000 mg | ORAL_CAPSULE | Freq: Two times a day (BID) | ORAL | Status: DC
  Administered 2020-04-23 (×2): 100 mg via ORAL
  Filled 2020-04-23 (×2): qty 1

## 2020-04-23 MED ORDER — EPHEDRINE 5 MG/ML INJ
INTRAVENOUS | Status: AC
  Filled 2020-04-23: qty 10

## 2020-04-23 MED ORDER — MIDAZOLAM HCL 2 MG/2ML IJ SOLN
INTRAMUSCULAR | Status: AC
  Filled 2020-04-23: qty 2

## 2020-04-23 MED ORDER — PROPOFOL 10 MG/ML IV BOLUS
INTRAVENOUS | Status: DC | PRN
  Administered 2020-04-23: 160 mg via INTRAVENOUS

## 2020-04-23 MED ORDER — FENTANYL CITRATE (PF) 250 MCG/5ML IJ SOLN
INTRAMUSCULAR | Status: AC
  Filled 2020-04-23: qty 5

## 2020-04-23 MED ORDER — ESMOLOL HCL 100 MG/10ML IV SOLN
INTRAVENOUS | Status: AC
  Filled 2020-04-23: qty 10

## 2020-04-23 MED ORDER — THROMBIN 20000 UNITS EX SOLR
CUTANEOUS | Status: DC | PRN
  Administered 2020-04-23: 20 mL via TOPICAL

## 2020-04-23 MED ORDER — MAGNESIUM CITRATE PO SOLN: 1.0000 | Freq: Once | ORAL | Status: DC | PRN

## 2020-04-23 MED ORDER — ROCURONIUM BROMIDE 10 MG/ML (PF) SYRINGE
PREFILLED_SYRINGE | INTRAVENOUS | Status: DC | PRN
  Administered 2020-04-23: 60 mg via INTRAVENOUS
  Administered 2020-04-23: 20 mg via INTRAVENOUS
  Administered 2020-04-23: 10 mg via INTRAVENOUS

## 2020-04-23 MED ORDER — BUPIVACAINE-EPINEPHRINE 0.5% -1:200000 IJ SOLN
INTRAMUSCULAR | Status: DC | PRN
  Administered 2020-04-23: 2 mL

## 2020-04-23 MED ORDER — CHLORHEXIDINE GLUCONATE 0.12 % MT SOLN
15.0000 mL | Freq: Once | OROMUCOSAL | Status: AC
  Administered 2020-04-23: 15 mL via OROMUCOSAL
  Filled 2020-04-23: qty 15

## 2020-04-23 MED ORDER — DEXAMETHASONE SODIUM PHOSPHATE 10 MG/ML IJ SOLN
INTRAMUSCULAR | Status: AC
  Filled 2020-04-23: qty 1

## 2020-04-23 MED ORDER — HYDROMORPHONE HCL 1 MG/ML IJ SOLN
0.2500 mg | INTRAMUSCULAR | Status: DC | PRN
  Administered 2020-04-23 (×2): 0.25 mg via INTRAVENOUS
  Administered 2020-04-23: 0.5 mg via INTRAVENOUS

## 2020-04-23 MED ORDER — PHENYLEPHRINE HCL-NACL 10-0.9 MG/250ML-% IV SOLN
INTRAVENOUS | Status: AC
  Filled 2020-04-23: qty 250

## 2020-04-23 MED ORDER — FENTANYL CITRATE (PF) 100 MCG/2ML IJ SOLN
25.0000 ug | INTRAMUSCULAR | Status: DC | PRN
  Administered 2020-04-23 (×2): 50 ug via INTRAVENOUS

## 2020-04-23 MED ORDER — 0.9 % SODIUM CHLORIDE (POUR BTL) OPTIME
TOPICAL | Status: DC | PRN
  Administered 2020-04-23: 1000 mL

## 2020-04-23 MED ORDER — OMEPRAZOLE 20 MG PO CPDR
20.0000 mg | DELAYED_RELEASE_CAPSULE | Freq: Every day | ORAL | Status: DC
  Administered 2020-04-24: 20 mg via ORAL
  Filled 2020-04-23: qty 1

## 2020-04-23 MED ORDER — ONDANSETRON HCL 4 MG/2ML IJ SOLN: 4.0000 mg | Freq: Four times a day (QID) | INTRAMUSCULAR | Status: DC | PRN

## 2020-04-23 MED ORDER — PHENYLEPHRINE 40 MCG/ML (10ML) SYRINGE FOR IV PUSH (FOR BLOOD PRESSURE SUPPORT)
PREFILLED_SYRINGE | INTRAVENOUS | Status: AC
  Filled 2020-04-23: qty 10

## 2020-04-23 MED ORDER — IRBESARTAN 75 MG PO TABS
75.0000 mg | ORAL_TABLET | Freq: Every day | ORAL | Status: DC
  Administered 2020-04-23: 75 mg via ORAL
  Filled 2020-04-23 (×2): qty 1

## 2020-04-23 MED ORDER — PHENYLEPHRINE HCL (PRESSORS) 10 MG/ML IV SOLN
INTRAVENOUS | Status: DC | PRN
  Administered 2020-04-23: 40 ug via INTRAVENOUS
  Administered 2020-04-23 (×2): 60 ug via INTRAVENOUS
  Administered 2020-04-23: 40 ug via INTRAVENOUS
  Administered 2020-04-23: 120 ug via INTRAVENOUS

## 2020-04-23 MED ORDER — SACCHAROMYCES BOULARDII 250 MG PO CAPS
250.0000 mg | ORAL_CAPSULE | Freq: Every day | ORAL | Status: DC
  Administered 2020-04-23: 250 mg via ORAL
  Filled 2020-04-23 (×2): qty 1

## 2020-04-23 SURGICAL SUPPLY — 60 items
BAG DECANTER FOR FLEXI CONT (MISCELLANEOUS) ×2 IMPLANT
BAND INSRT 18 STRL LF DISP RB (MISCELLANEOUS) ×2
BAND RUBBER #18 3X1/16 STRL (MISCELLANEOUS) ×4 IMPLANT
CLEANER TIP ELECTROSURG 2X2 (MISCELLANEOUS) ×2 IMPLANT
CNTNR URN SCR LID CUP LEK RST (MISCELLANEOUS) ×1 IMPLANT
CONT SPEC 4OZ STRL OR WHT (MISCELLANEOUS) ×2
COVER WAND RF STERILE (DRAPES) ×1 IMPLANT
DRAPE LAPAROTOMY 100X72X124 (DRAPES) ×2 IMPLANT
DRAPE MICROSCOPE LEICA (MISCELLANEOUS) ×2 IMPLANT
DRAPE SHEET LG 3/4 BI-LAMINATE (DRAPES) ×2 IMPLANT
DRAPE SURG 17X11 SM STRL (DRAPES) ×2 IMPLANT
DRAPE UTILITY XL STRL (DRAPES) ×2 IMPLANT
DRSG AQUACEL AG ADV 3.5X 4 (GAUZE/BANDAGES/DRESSINGS) IMPLANT
DRSG AQUACEL AG ADV 3.5X 6 (GAUZE/BANDAGES/DRESSINGS) IMPLANT
DRSG OPSITE POSTOP 4X6 (GAUZE/BANDAGES/DRESSINGS) ×1 IMPLANT
DRSG TELFA 3X8 NADH (GAUZE/BANDAGES/DRESSINGS) IMPLANT
DURAPREP 26ML APPLICATOR (WOUND CARE) ×2 IMPLANT
DURASEAL SPINE SEALANT 3ML (MISCELLANEOUS) IMPLANT
ELECT BLADE 4.0 EZ CLEAN MEGAD (MISCELLANEOUS)
ELECT REM PT RETURN 9FT ADLT (ELECTROSURGICAL) ×2
ELECTRODE BLDE 4.0 EZ CLN MEGD (MISCELLANEOUS) IMPLANT
ELECTRODE REM PT RTRN 9FT ADLT (ELECTROSURGICAL) ×1 IMPLANT
GLOVE BIO SURGEON STRL SZ7.5 (GLOVE) ×2 IMPLANT
GLOVE BIOGEL PI IND STRL 7.0 (GLOVE) ×1 IMPLANT
GLOVE BIOGEL PI IND STRL 7.5 (GLOVE) IMPLANT
GLOVE BIOGEL PI INDICATOR 7.0 (GLOVE) ×1
GLOVE BIOGEL PI INDICATOR 7.5 (GLOVE) ×2
GLOVE SURG SS PI 7.0 STRL IVOR (GLOVE) ×3 IMPLANT
GLOVE SURG SS PI 7.5 STRL IVOR (GLOVE) ×2 IMPLANT
GLOVE SURG SS PI 8.0 STRL IVOR (GLOVE) ×4 IMPLANT
GOWN STRL REUS W/ TWL LRG LVL3 (GOWN DISPOSABLE) ×1 IMPLANT
GOWN STRL REUS W/ TWL XL LVL3 (GOWN DISPOSABLE) ×1 IMPLANT
GOWN STRL REUS W/TWL LRG LVL3 (GOWN DISPOSABLE) ×2
GOWN STRL REUS W/TWL XL LVL3 (GOWN DISPOSABLE) ×6
IV CATH 14GX2 1/4 (CATHETERS) ×2 IMPLANT
KIT BASIN OR (CUSTOM PROCEDURE TRAY) ×2 IMPLANT
KIT POSITION SURG JACKSON T1 (MISCELLANEOUS) IMPLANT
NDL SPNL 18GX3.5 QUINCKE PK (NEEDLE) ×2 IMPLANT
NEEDLE 22X1 1/2 (OR ONLY) (NEEDLE) ×2 IMPLANT
NEEDLE SPNL 18GX3.5 QUINCKE PK (NEEDLE) ×4 IMPLANT
PACK LAMINECTOMY NEURO (CUSTOM PROCEDURE TRAY) ×2 IMPLANT
PAD DRESSING TELFA 3X8 NADH (GAUZE/BANDAGES/DRESSINGS) IMPLANT
PATTIES SURGICAL .75X.75 (GAUZE/BANDAGES/DRESSINGS) ×2 IMPLANT
SPONGE LAP 4X18 RFD (DISPOSABLE) ×1 IMPLANT
SPONGE SURGIFOAM ABS GEL 100 (HEMOSTASIS) ×2 IMPLANT
STAPLER VISISTAT (STAPLE) IMPLANT
STRIP CLOSURE SKIN 1/2X4 (GAUZE/BANDAGES/DRESSINGS) ×2 IMPLANT
SUT NURALON 4 0 TR CR/8 (SUTURE) IMPLANT
SUT PROLENE 3 0 PS 2 (SUTURE) ×1 IMPLANT
SUT VIC AB 1 CT1 27 (SUTURE) ×4
SUT VIC AB 1 CT1 27XBRD ANTBC (SUTURE) IMPLANT
SUT VIC AB 1-0 CT2 27 (SUTURE) ×1 IMPLANT
SUT VIC AB 2-0 CT1 27 (SUTURE)
SUT VIC AB 2-0 CT1 TAPERPNT 27 (SUTURE) IMPLANT
SUT VIC AB 2-0 CT2 27 (SUTURE) ×1 IMPLANT
SYR 3ML LL SCALE MARK (SYRINGE) ×2 IMPLANT
TOWEL GREEN STERILE (TOWEL DISPOSABLE) ×2 IMPLANT
TOWEL GREEN STERILE FF (TOWEL DISPOSABLE) ×2 IMPLANT
TRAY FOLEY MTR SLVR 16FR STAT (SET/KITS/TRAYS/PACK) ×2 IMPLANT
YANKAUER SUCT BULB TIP NO VENT (SUCTIONS) ×2 IMPLANT

## 2020-04-23 NOTE — Anesthesia Procedure Notes (Signed)
Procedure Name: Intubation Date/Time: 04/23/2020 7:53 AM Performed by: Griffin Dakin, CRNA Pre-anesthesia Checklist: Patient identified, Emergency Drugs available, Suction available and Patient being monitored Patient Re-evaluated:Patient Re-evaluated prior to induction Oxygen Delivery Method: Circle system utilized Preoxygenation: Pre-oxygenation with 100% oxygen Induction Type: IV induction Ventilation: Oral airway inserted - appropriate to patient size and Two handed mask ventilation required Laryngoscope Size: Mac and 4 Grade View: Grade II Tube type: Oral Tube size: 7.5 mm Number of attempts: 1 Airway Equipment and Method: Stylet and Oral airway Placement Confirmation: ETT inserted through vocal cords under direct vision,  positive ETCO2 and breath sounds checked- equal and bilateral Secured at: 23 cm Tube secured with: Tape Dental Injury: Teeth and Oropharynx as per pre-operative assessment  Comments: Thick beard may have impeded mask ventilation. Placed by Dyann Ruddle

## 2020-04-23 NOTE — Op Note (Signed)
NAME: Norman Lambert, Norman Lambert MEDICAL RECORD DQ:[id number] ACCOUNT [id number] DATE OF BIRTH:[birth year] FACILITY: [facility] LOCATION: [facility] PHYSICIAN:Marvin Grabill Windy Kalata, MD  OPERATIVE REPORT  DATE OF PROCEDURE:  04/23/2020  PREOPERATIVE DIAGNOSIS:  Spinal stenosis L5-S1, L4-5.  POSTOPERATIVE DIAGNOSIS:  Spinal stenosis L5-S1, L4-5.  PROCEDURE PERFORMED: 1.  Microlumbar decompression at L5-S1, L4-5.   2.  Two hemilaminectomy, L5, left.   3.  Foraminotomy, L5-S1, left.  ANESTHESIA:  General.  SURGEON:  Susa Day, MD  ASSISTANT:  Lacie Draft, PA  HISTORY:  A 69 year old male with severe left lower extremity radicular pain, L5 nerve root distribution with EHL, dorsiflexion weakness, diminished sensation in the dorsum of the foot with neural tension signs and MRI indicating compression of the L5  nerve root in the lateral recess at L5-S1 and into the foramen due to the superior articulating process of S1.  He did not have pedicle-on-pedicle foraminal stenosis and we felt the decompression of the L5 root here and then decompressing proximally to  fully decompress the L5 nerve root.  Risks and benefits discussed, including bleeding, infection, damage to neurovascular structures, no change in symptoms, worsening symptoms, DVT, PE, anesthetic complications, etc.  The patient preoperatively had  psoriasis.  We had him optimize his psoriatic control prior to the procedure.  DESCRIPTION OF PROCEDURE:  With the patient in supine position, after induction of adequate general anesthesia, 2 g Kefzol and 600 clindamycin, he was placed prone on the Wilson frame.  All bony prominences were well padded.  There was a psoriatic area  across the upper and lower lumbar region.  Two 18-gauge spinal needles were utilized to localize the last ____ segment, which was at L5-S1.  He had a transitional segment.  This allowed [place] to open the disk space.  We made an incision at the 18-gauge  needles to below the area of the  psoriatic involvement with the psoriasis.  There was no evidence of infection or pustules or open portion of the wound.  Subcutaneous tissue was dissected.  Electrocautery was utilized to achieve hemostasis.  Dorsal  lumbar fascia was divided in line with the skin incision.  Paraspinous muscle elevated from the lamina L5-S1 and L4-5.  McCullough retractor was placed.  Confirmatory radiograph obtained.  We had to extend the incision slightly cephalad just to get to  above the lamina of L5 after the operating microscope was draped and brought in the surgical field.  After the paraspinous muscles were elevated from lamina of L5 and S1, McCullough retractor had been placed.  There was a very small interlaminar window  at L5-S1and facet hypertrophy.  I used a Leksell rongeur to remove a portion of the facet at the inferior articulating process and the portion of the lamina of L5.  I then used an osteotome to remove an additional portion of the inferior process of L5,  exposing the superior articulating process of S1.  I then used a small curette to detach ligamentum flavum from the cephalad edge of S1 and along the superior articulating process of S1.  We then placed a Woodson beneath the lamina of S1 and through the  lateral recess of L5-S1.  I protected the S1 nerve root.  Noted was extensive epidural venous plexus.  I performed a foraminotomy of S1,  protecting that and then I decompressed the lateral recess and medial border of the pedicle.  Then, using a micro  curette,  I detached ligamentum flavum from the superior articulating process at cephalad edge and got  into the foramen of L5.  I also performed a hemilaminotomy at the caudad edge of L5, extending it cephalad.  After identifying the tip of the superior  articulating process, we removed that with a curette.  By protecting the S1 and the L5 nerve root, we removed the cephalad portion of the superior articulating process of S1.  We performed a generous  foraminotomy of L5.  I extended out into the foramen.   Again, extensive epidural venous plexus was noted.  Prior to continuing with the foraminotomy, the [facility] retractor identified severe compression on the L5 root.  Again, this was protected with a Woodson retractor at all times.  I continued cephalad,  removing the hemilamina of L5  after using a Woodson retractor beneath the lamina of L5, extending to remove the hemilamina of L5 following the L5 nerve root proximally.  Again, protected with a Youth worker.  Ligamentum flavum removed from the very  small interspace at L4-L5.  There was no disk herniation noted here.  From the other side of the operating room table, I continued with a foraminotomy of L5 and again undercutting and removing the cephalad portion of the superior articulating process of  S1 and its enfolded ligamentum flavum.  We protected the neural elements with neuropatties at all times.  Next, a confirmatory radiograph was obtained.  Then, with a Woodson in the foramen of L4 and of L5, I then meticulously placed bone wax in all  cancellous surfaces.  He had a couple epidural veins that were cauterized.  The patient had Valsalva as he was lightheaded and that increased some bleeding.  We allowed for reduction of blood pressure and then achieved hemostasis meticulously with  bipolar cautery with a combination of neuropatties and thrombin-soaked Gelfoam and bone wax.  Following this, the [facility] retractor was placed freely out the remaining foramen of L5 and of S1 with no compression on the L5 nerve root.  Epidural venous  plexus was noted there as well.  After copious irrigation with antibiotic irrigation, I reinspected, and we had achieved strict hemostasis.  The thrombin-soaked Gelfoam had been removed and just thrombin was left at the laminotomy site.  Next, we  copiously irrigated the wound.  Again, just prior to that, we were able to achieve hemostasis.  There was some paraspinous  veins that were cauterized as well.  I then repaired the dorsal lumbar fascia after removing the McCulloch retractor and inspected  the paraspinous musculature and achieving strict hemostasis, copiously irrigated in the dorsal lumbar fascia, reapproximated with #1 Vicryl interrupted figure-of-eight sutures and the caudad edge of the wound was left open approximately 1 cm to allow for  any drainage if it did occur,  subcutaneous with 2-0 and the skin cephalad near the area of the psoriasis with Prolene subcuticular and distally with staples.  The wound was dressed sterilely.  He was placed supine on the hospital bed and extubated  without difficulty and transported to the recovery room in satisfactory condition.  The patient tolerated the procedure well.  No complications.  Blood loss 200 mL.  VN/NUANCE  D:04/23/2020 T:04/23/2020 JOB:[id number]

## 2020-04-23 NOTE — Interval H&P Note (Signed)
History and Physical Interval Note:  04/23/2020 7:37 AM  [name]  has presented today for surgery, with the diagnosis of STENOSIS L4-S1.  The various methods of treatment have been discussed with the patient and family. After consideration of risks, benefits and other options for treatment, the patient has consented to  Procedure(s): MICRO LUMBER DECOMPRESSION L4-S1 ON LEFT AND FORAMINOTOMY L5 LEFT (Left) as a surgical intervention.  The patient's history has been reviewed, patient examined, no change in status, stable for surgery.  I have reviewed the patient's chart and labs.  Questions were answered to the patient's satisfaction.     Norman Lambert

## 2020-04-23 NOTE — Interval H&P Note (Signed)
History and Physical Interval Note:  04/23/2020 7:38 AM  Norman Lambert  has presented today for surgery, with the diagnosis of STENOSIS L4-S1.  The various methods of treatment have been discussed with the patient and family. After consideration of risks, benefits and other options for treatment, the patient has consented to  Procedure(s): MICRO LUMBER DECOMPRESSION L4-S1 ON LEFT AND FORAMINOTOMY L5 LEFT (Left) as a surgical intervention.  The patient's history has been reviewed, patient examined, no change in status, stable for surgery.  I have reviewed the patient's chart and labs.  Questions were answered to the patient's satisfaction.     Johnn Hai

## 2020-04-23 NOTE — Discharge Instructions (Signed)

## 2020-04-23 NOTE — Anesthesia Postprocedure Evaluation (Signed)
Anesthesia Post Note  Patient: Norman Lambert  Procedure(s) Performed: MICRO LUMBER DECOMPRESSION LUMBAR FOUR-SACRAL ONE ON LEFT AND FORAMINOTOMY LUMBAR FIVE LEFT (Left Spine Lumbar)     Patient location during evaluation: PACU Anesthesia Type: General Level of consciousness: awake Pain management: pain level controlled Vital Signs Assessment: post-procedure vital signs reviewed and stable Respiratory status: spontaneous breathing Cardiovascular status: stable Postop Assessment: no apparent nausea or vomiting Anesthetic complications: no   No complications documented.  Last Vitals:  Vitals:   04/23/20 1245 04/23/20 1618  BP: (!) 159/76 (!) 166/82  Pulse: 63 97  Resp: 17 17  Temp: 36.4 C 36.4 C  SpO2: 97% 100%    Last Pain:  Vitals:   04/23/20 1618  TempSrc: Oral  PainSc:                  [place] Mullett

## 2020-04-23 NOTE — Evaluation (Signed)
Physical Therapy Evaluation Patient Details Name: Norman Lambert MRN: [id number] DOB: Jan 13, 1951 Today's Date: 04/23/2020   History of Present Illness  Pt is a 69 y.o. M with significant PMH of R shoulder replacement who presents with spinal stenosis L4-S1 now s/p microlumbar decompression L4-S1, two hemilaminectomy L5 (left) and foraminotomy L5-S1 (left).  Clinical Impression  Patient evaluated by Physical Therapy with no further acute PT needs identified. Prior to admission, pt lives with his spouse and is independent. Pt reports mild surgical pain but denies radicular symptoms. Pt ambulating 400 feet with no assistive device without physical difficulty. Education provided regarding spinal precautions and generalized walking program. All education has been completed and the patient has no further questions. No follow-up Physical Therapy or equipment needs. PT is signing off. Thank you for this referral.     Follow Up Recommendations No PT follow up;Supervision for mobility/OOB    Equipment Recommendations  None recommended by PT    Recommendations for Other Services       Precautions / Restrictions Precautions Precautions: Back Precaution Booklet Issued: Yes (comment) Precaution Comments: Verbally reviewed, provided written handout Restrictions Weight Bearing Restrictions: No      Mobility  Bed Mobility Overal bed mobility: Modified Independent             General bed mobility comments: Sitting EOB upon arrival, progressing sit > sidelying without physical assist  Transfers Overall transfer level: Needs assistance Equipment used: None Transfers: Sit to/from Stand Sit to Stand: Supervision            Ambulation/Gait Ambulation/Gait assistance: Supervision Gait Distance (Feet): 400 Feet Assistive device: IV Pole Gait Pattern/deviations: Step-through pattern Gait velocity: decreased   General Gait Details: Cues for upward gaze, upper trunk control, no overt  LOB  Stairs            Wheelchair Mobility    Modified Rankin (Stroke Patients Only)       Balance Overall balance assessment: Mild deficits observed, not formally tested                                           Pertinent Vitals/Pain Pain Assessment: Faces Faces Pain Scale: Hurts little more Pain Location: surgical site Pain Descriptors / Indicators: Operative site guarding Pain Intervention(s): Monitored during session    Home Living Family/patient expects to be discharged to:: Private residence Living Arrangements: Spouse/significant other Available Help at Discharge: Family Type of Home: House Home Access: Stairs to enter Entrance Stairs-Rails: Psychiatric nurse of Steps: 3 Home Layout: One level Home Equipment:  ("has some of his wife's equipment")      Prior Function Level of Independence: Independent         Comments: Retired     Journalist, newspaper        Extremity/Trunk Assessment   Upper Extremity Assessment Upper Extremity Assessment: Overall WFL for tasks assessed    Lower Extremity Assessment Lower Extremity Assessment: RLE deficits/detail;LLE deficits/detail RLE Deficits / Details: Strength 5/5 LLE Deficits / Details: Strength 5/5    Cervical / Trunk Assessment Cervical / Trunk Assessment: Other exceptions Cervical / Trunk Exceptions: s/p decompression L4-S1, forward head posture  Communication   Communication: No difficulties  Cognition Arousal/Alertness: Awake/alert Behavior During Therapy: WFL for tasks assessed/performed Overall Cognitive Status: Within Functional Limits for tasks assessed  General Comments      Exercises     Assessment/Plan    PT Assessment Patent does not need any further PT services  PT Problem List         PT Treatment Interventions      PT Goals (Current goals can be found in the Care Plan section)  Acute  Rehab PT Goals Patient Stated Goal: less pain PT Goal Formulation: All assessment and education complete, DC therapy    Frequency     Barriers to discharge        Co-evaluation               AM-PAC PT "6 Clicks" Mobility  Outcome Measure Help needed turning from your back to your side while in a flat bed without using bedrails?: None Help needed moving from lying on your back to sitting on the side of a flat bed without using bedrails?: None Help needed moving to and from a bed to a chair (including a wheelchair)?: None Help needed standing up from a chair using your arms (e.g., wheelchair or bedside chair)?: None Help needed to walk in hospital room?: None Help needed climbing 3-5 steps with a railing? : A Little 6 Click Score: 23    End of Session   Activity Tolerance: Patient tolerated treatment well Patient left: in bed;with call bell/phone within reach Nurse Communication: Mobility status PT Visit Diagnosis: Pain Pain - part of body:  (back)    Time: 7121-9758 PT Time Calculation (min) (ACUTE ONLY): 17 min   Charges:   PT Evaluation $PT Eval Low Complexity: Vanleer, PT, DPT Acute Rehabilitation Services Pager [phone] Office [phone]   Deno Etienne 04/23/2020, 5:35 PM

## 2020-04-23 NOTE — H&P (Signed)
Norman Lambert is an 69 y.o. male.   Chief Complaint: left leg pain HPI: leg pain due to lumbar radiculopathy refractory  Past Medical History:  Diagnosis Date  . Abnormal EKG   . Arthritis   . Essential hypertension 01/02/2017  . GERD (gastroesophageal reflux disease)   . Gout   . Hyperlipidemia 01/02/2017  . Hypertension   . Hypothyroidism   . Peripheral vascular disease ([place])   . RBBB 01/02/2017  . Rotator cuff tear   . Sleep apnea     Past Surgical History:  Procedure Laterality Date  . APPENDECTOMY    . CATARACT EXTRACTION Bilateral   . EYE SURGERY     8 years ago  . FEMORAL ENDARTERECTOMY Bilateral    done 15 yrs. ago at [facility]  . JOINT REPLACEMENT    . REVERSE SHOULDER ARTHROPLASTY Right 12/16/2016   Procedure: RIGHT REVERSE TOTAL SHOULDER ARTHROPLASTY;  Surgeon: Netta Cedars, MD;  Location: [facility];  Service: Orthopedics;  Laterality: Right;  . SEPTOPLASTY    . TONSILLECTOMY      History reviewed. No pertinent family history. Social History:  reports that he quit smoking about 33 years ago. His smoking use included cigarettes. He has a 60.00 pack-year smoking history. He has never used smokeless tobacco. He reports current alcohol use. He reports that he does not use drugs.  Allergies:  Allergies  Allergen Reactions  . Other Rash and Other (See Comments)    " DARK DYES " [ UNSPECIFIED DYE ]    Medications Prior to Admission  Medication Sig Dispense Refill  . APPLE CIDER VINEGAR PO Take 1,200 mg by mouth at bedtime.    Marland Kitchen ascorbic acid (VITAMIN C) 500 MG tablet Take 500 mg by mouth in the morning and at bedtime.    . Cholecalciferol (VITAMIN D-3) 125 MCG (5000 UT) TABS Take 5,000 Units by mouth in the morning and at bedtime.    . Coenzyme Q10-Vitamin E (QUNOL ULTRA COQ10) 100-150 MG-UNIT CAPS Take 1 capsule by mouth daily.    . finasteride (PROSCAR) 5 MG tablet Take 5 mg by mouth daily.    . folic acid (FOLVITE) 1 MG tablet Take 1 mg by mouth daily.    .  hydrochlorothiazide (HYDRODIURIL) 25 MG tablet Take 25 mg by mouth daily.    Marland Kitchen levocetirizine (XYZAL) 5 MG tablet Take 5 mg by mouth at bedtime.     Marland Kitchen levothyroxine (SYNTHROID, LEVOTHROID) 112 MCG tablet Take 112 mcg by mouth daily before breakfast.    . MEGARED OMEGA-3 KRILL OIL PO Take 750 mg by mouth daily in the afternoon.    . methotrexate (RHEUMATREX) 2.5 MG tablet Take 15 mg by mouth every Saturday. 6 tablets once a week    . modafinil (PROVIGIL) 200 MG tablet Take 200 mg by mouth daily. In the morning    . Multiple Vitamin (MULTIVITAMIN WITH MINERALS) TABS tablet Take 1 tablet by mouth daily. One A Day for Men 50+    . Nutritional Supplements (FRUIT & VEGETABLE DAILY PO) Take 3 capsules by mouth in the morning and at bedtime. (3) Fruit in the morning & (3) Veggie at night.    Marland Kitchen omeprazole (PRILOSEC OTC) 20 MG tablet Take 20 mg by mouth daily before breakfast.     . Probiotic Product (PROBIOTIC COLON SUPPORT PO) Take 2 capsules by mouth daily in the afternoon. Garden of Life    . rosuvastatin (CRESTOR) 5 MG tablet Take 5 mg by mouth daily.    Marland Kitchen  tamsulosin (FLOMAX) 0.4 MG CAPS capsule Take 0.8 mg by mouth daily.    Marland Kitchen triamcinolone ointment (KENALOG) 0.1 % Apply 1 application topically in the morning and at bedtime.    . valsartan (DIOVAN) 80 MG tablet Take 160 mg by mouth daily.    . vitamin B-12 (CYANOCOBALAMIN) 1000 MCG tablet Take 1,000 mcg by mouth in the morning and at bedtime. Morning & afternoon    . Zinc 50 MG CAPS Take 50 mg by mouth daily. In the morning    . doxepin (SINEQUAN) 25 MG capsule Take 1 capsule (25 mg total) by mouth at bedtime. (Patient not taking: Reported on 04/13/2020) 90 capsule 3    Results for orders placed or performed during the hospital encounter of 04/21/20 (from the past 48 hour(s))  SARS CORONAVIRUS 2 (TAT 6-24 HRS) Nasopharyngeal Nasopharyngeal Swab     Status: None   Collection Time: 04/21/20  9:36 AM   Specimen: Nasopharyngeal Swab  Result Value Ref  Range   SARS Coronavirus 2 NEGATIVE NEGATIVE    Comment: (NOTE) SARS-CoV-2 target nucleic acids are NOT DETECTED.  The SARS-CoV-2 RNA is generally detectable in upper and lower respiratory specimens during the acute phase of infection. Negative results do not preclude SARS-CoV-2 infection, do not rule out co-infections with other pathogens, and should not be used as the sole basis for treatment or other patient management decisions. Negative results must be combined with clinical observations, patient history, and epidemiological information. The expected result is Negative.  Fact Sheet for Patients: SugarRoll.be  Fact Sheet for Healthcare Providers: https://www.woods-mathews.com/  This test is not yet approved or cleared by the [place] FDA and  has been authorized for detection and/or diagnosis of SARS-CoV-2 by FDA under an Emergency Use Authorization (EUA). This EUA will remain  in effect (meaning this test can be used) for the duration of the COVID-19 declaration under Se ction 564(b)(1) of the Act, 21 U.S.C. section 360bbb-3(b)(1), unless the authorization is terminated or revoked sooner.  Performed at [facility], [place] [address], [place], [place] [address]    DG Lumbar Spine 2-3 Views  Result Date: 04/21/2020 CLINICAL DATA:  Low back pain radiating the left leg EXAM: LUMBAR SPINE - 2 VIEW COMPARISON:  11/23/2017, 11/30/2019 FINDINGS: Five lumbar type vertebral bodies are well visualized. Numbering nomenclature is standard. Vertebral body height is well maintained. Mild osteophytic changes are noted. Mild retrolisthesis of L2 on L3 and L3 on L4 is noted. Mild facet hypertrophic changes are seen. Disc space narrowing at L4-5 is noted. Aortic calcifications are seen without aneurysmal dilatation. IMPRESSION: Mild degenerative change as described.  No acute abnormality noted. Electronically Signed   By: Inez Catalina M.D.   On:  04/21/2020 09:04    Review of Systems  Neurological: Positive for weakness and numbness.       Dorsum of left foot with decreased sensation. EHL 4/5 left.  All other systems reviewed and are negative.   Blood pressure (!) 170/61, pulse 78, temperature 98.2 F (36.8 C), resp. rate 20, height 6' (1.829 m), weight 104.5 kg, SpO2 99 %. Physical Exam Vitals reviewed.  Constitutional:      Appearance: Normal appearance.  HENT:     Head: Normocephalic.  Eyes:     Pupils: Pupils are equal, round, and reactive to light.  Cardiovascular:     Rate and Rhythm: Normal rate.     Pulses: Normal pulses.  Pulmonary:     Effort: Pulmonary effort is normal.  Abdominal:     Palpations: Abdomen is soft.  Neurological:     Mental Status: He is alert.     Motor: Weakness present.     Comments: Left EHL 4/5. Positive SLR. No DVT. Decreased sensation dorsum left foot.    MRI: stenosis at L45 left and L5S1 foramen left.  Assessment/Plan  L5 Radiculopathy left secondary to spinal stenosis at L4-5 and L5-S1 with L5 foraminal stenosis.  Positive neurotension signs.  EHL weakness and diminished sensation in the L5 dermatome refractory.  Plan:  Microlumbar decompression at L4-5 L5 and L5-S1 left with foraminotomy L5 left.  Risk and benefits discussed including bleeding, damage to neurovascular structures, CSF leakage, recurrent stenosis, worsening in symptoms.,  Need for revision decompression and fusion.  Johnn Hai, MD 04/23/2020, 7:31 AM

## 2020-04-23 NOTE — Brief Op Note (Signed)
04/23/2020  10:51 AM  PATIENT:  Norman Lambert  69 y.o. male  PRE-OPERATIVE DIAGNOSIS:  STENOSIS L4-S1  POST-OPERATIVE DIAGNOSIS:  STENOSIS LUMBAR FOUR-SACRAL ONE  PROCEDURE:  Procedure(s) with comments: MICRO LUMBER DECOMPRESSION LUMBAR FOUR-SACRAL ONE ON LEFT AND FORAMINOTOMY LUMBAR FIVE LEFT (Left) - posterior  SURGEON:  Surgeon(s) and Role:    Susa Day, MD - Primary  PHYSICIAN ASSISTANT:   ASSISTANTS: Bissell   ANESTHESIA:   general  EBL:  200 mL   BLOOD ADMINISTERED:none  DRAINS: none   LOCAL MEDICATIONS USED:  MARCAINE     SPECIMEN:  No Specimen  DISPOSITION OF SPECIMEN:  N/A  COUNTS:  YES  TOURNIQUET:  * No tourniquets in log *  DICTATION: .Other Dictation: Dictation Number [id number]  PLAN OF CARE: Admit for overnight observation  PATIENT DISPOSITION:  PACU - hemodynamically stable.   Delay start of Pharmacological VTE agent (>24hrs) due to surgical blood loss or risk of bleeding: yes

## 2020-04-23 NOTE — Transfer of Care (Signed)
Immediate Anesthesia Transfer of Care Note  Patient: Norman Lambert  Procedure(s) Performed: MICRO LUMBER DECOMPRESSION LUMBAR FOUR-SACRAL ONE ON LEFT AND FORAMINOTOMY LUMBAR FIVE LEFT (Left Spine Lumbar)  Patient Location: PACU  Anesthesia Type:General  Level of Consciousness: awake, alert  and oriented  Airway & Oxygen Therapy: Patient Spontanous Breathing and Patient connected to face mask oxygen  Post-op Assessment: Report given to RN, Post -op Vital signs reviewed and stable and Patient moving all extremities X 4  Post vital signs: Reviewed and stable  Last Vitals:  Vitals Value Taken Time  BP 129/62 04/23/20 1056  Temp    Pulse 91 04/23/20 1058  Resp 22 04/23/20 1058  SpO2 100 % 04/23/20 1058  Vitals shown include unvalidated device data.  Last Pain:  Vitals:   04/23/20 0607  PainSc: 5       Patients Stated Pain Goal: 2 (57/50/51 8335)  Complications: No complications documented.

## 2020-04-24 ENCOUNTER — Encounter (HOSPITAL_COMMUNITY): Payer: Self-pay | Admitting: Specialist

## 2020-04-24 DIAGNOSIS — M48061 Spinal stenosis, lumbar region without neurogenic claudication: Secondary | ICD-10-CM | POA: Diagnosis not present

## 2020-04-24 LAB — CBC
HCT: 31.8 % — ABNORMAL LOW (ref 39.0–52.0)
Hemoglobin: 10.5 g/dL — ABNORMAL LOW (ref 13.0–17.0)
MCH: 32.4 pg (ref 26.0–34.0)
MCHC: 33 g/dL (ref 30.0–36.0)
MCV: 98.1 fL (ref 80.0–100.0)
Platelets: 137 10*3/uL — ABNORMAL LOW (ref 150–400)
RBC: 3.24 MIL/uL — ABNORMAL LOW (ref 4.22–5.81)
RDW: 13.2 % (ref 11.5–15.5)
WBC: 10.1 10*3/uL (ref 4.0–10.5)
nRBC: 0 % (ref 0.0–0.2)

## 2020-04-24 LAB — BASIC METABOLIC PANEL
Anion gap: 13 (ref 5–15)
BUN: 11 mg/dL (ref 8–23)
CO2: 22 mmol/L (ref 22–32)
Calcium: 8.7 mg/dL — ABNORMAL LOW (ref 8.9–10.3)
Chloride: 98 mmol/L (ref 98–111)
Creatinine, Ser: 0.86 mg/dL (ref 0.61–1.24)
GFR calc Af Amer: >60 mL/min (ref >60)
GFR calc non Af Amer: >60 mL/min (ref >60)
Glucose, Bld: 195 mg/dL — ABNORMAL HIGH (ref 70–99)
Potassium: 3.4 mmol/L — ABNORMAL LOW (ref 3.5–5.1)
Sodium: 133 mmol/L — ABNORMAL LOW (ref 135–145)

## 2020-04-24 MED ORDER — POLYETHYLENE GLYCOL 3350 17 G PO PACK: 17.0000 g | PACK | Freq: Every day | ORAL | 0 refills | Status: DC | PRN

## 2020-04-24 MED ORDER — OXYCODONE HCL 5 MG PO TABS
5.0000 mg | ORAL_TABLET | ORAL | 0 refills | Status: DC | PRN
Start: 2020-04-24 — End: 2024-01-17

## 2020-04-24 MED ORDER — DOCUSATE SODIUM 100 MG PO CAPS
100.0000 mg | ORAL_CAPSULE | Freq: Two times a day (BID) | ORAL | 0 refills | Status: DC
Start: 2020-04-24 — End: 2023-12-06

## 2020-04-24 NOTE — Progress Notes (Signed)
Patient alert and oriented, mae's well, voiding adequate amount of urine, swallowing without difficulty, no c/o pain at time of discharge. Patient discharged home with family. Script and discharged instructions given to patient. Patient and family stated understanding of instructions given. Patient has an appointment with Dr. Beane ?

## 2020-04-24 NOTE — Progress Notes (Signed)
Subjective: 1 Day Post-Op Procedure(s) (LRB): MICRO LUMBER DECOMPRESSION LUMBAR FOUR-SACRAL ONE ON LEFT AND FORAMINOTOMY LUMBAR FIVE LEFT (Left) Patient reports pain as mild.  Reports leg pain resolved. Voiding without difficulty. No other c/o. Ready for D/C  Objective: Vital signs in last 24 hours: Temp:  [97.6 F (36.4 C)-98.2 F (36.8 C)] 98.2 F (36.8 C) (09/10 0722) Pulse Rate:  [47-97] 73 (09/10 0722) Resp:  [12-20] 17 (09/10 0722) BP: (114-166)/(52-82) 114/52 (09/10 0722) SpO2:  [96 %-100 %] 99 % (09/10 0722)  Intake/Output from previous day: 09/09 0701 - 09/10 0700 In: 1800 [I.V.:1300; IV Piggyback:500] Out: 600 [Urine:400; Blood:200] Intake/Output this shift: No intake/output data recorded.  Recent Labs    04/21/20 0846  HGB 12.4*   Recent Labs    04/21/20 0846  WBC 6.4  RBC 3.84*  HCT 38.0*  PLT 181   Recent Labs    04/21/20 0846  NA 133*  K 3.4*  CL 93*  CO2 29  BUN 6*  CREATININE 0.76  GLUCOSE 93  CALCIUM 9.6   No results for input(s): LABPT, INR in the last 72 hours.  Neurologically intact ABD soft Neurovascular intact Sensation intact distally Intact pulses distally Dorsiflexion/Plantar flexion intact Incision: dressing C/D/I and scant drainage No cellulitis present Compartment soft no sign of DVT   Assessment/Plan: 1 Day Post-Op Procedure(s) (LRB): MICRO LUMBER DECOMPRESSION LUMBAR FOUR-SACRAL ONE ON LEFT AND FORAMINOTOMY LUMBAR FIVE LEFT (Left) Advance diet Up with therapy D/C IV fluids  Discussed D/C instructions, Lspine precautions, dressing instructions D/C home today  Cecilie Kicks 04/24/2020, 8:03 AM

## 2020-04-24 NOTE — Evaluation (Signed)
Occupational Therapy Evaluation Patient Details Name: Norman Lambert MRN: [id number] DOB: July 04, 1951 Today's Date: 04/24/2020    History of Present Illness Pt is a 69 y.o. M with significant PMH of R shoulder replacement who presents with spinal stenosis L4-S1 now s/p microlumbar decompression L4-S1, two hemilaminectomy L5 (left) and foraminotomy L5-S1 (left).   Clinical Impression   PTA, pt was living with his wife and was independent. Currently, pt requires Supervision-Mod I for ADLs and functional mobility using RW. Provided education and handout on back precautions, grooming, LB ADLs with AE, toileting, and shower transfer; pt demonstrated understanding. Answered all pt questions. Recommend dc home once medically stable per physician. All acute OT needs met and will sign off. Thank you.     Follow Up Recommendations  No OT follow up    Equipment Recommendations  None recommended by OT    Recommendations for Other Services       Precautions / Restrictions Precautions Precautions: Back Precaution Booklet Issued: Yes (comment) Precaution Comments: Verbally reviewed, provided written handout      Mobility Bed Mobility Overal bed mobility: Modified Independent             General bed mobility comments: Pt requiring increased time  Transfers Overall transfer level: Needs assistance Equipment used: None Transfers: Sit to/from Stand Sit to Stand: Supervision              Balance Overall balance assessment: Mild deficits observed, not formally tested                                         ADL either performed or assessed with clinical judgement   ADL Overall ADL's : Needs assistance/impaired                                       General ADL Comments: Pt performing ADLs and functional mobility at Supervision level. Pt with slight difficulty donning underwear. Educating pt on use of reacher for donning LB clothing. Pt donning  sandals with reacher. Educating pt on shower transfer, toileting, and back precautions.      Vision         Perception     Praxis      Pertinent Vitals/Pain Pain Assessment: Faces Faces Pain Scale: No hurt Pain Location: surgical site Pain Descriptors / Indicators: Operative site guarding Pain Intervention(s): Monitored during session;Repositioned     Hand Dominance Right   Extremity/Trunk Assessment Upper Extremity Assessment Upper Extremity Assessment: Overall WFL for tasks assessed   Lower Extremity Assessment Lower Extremity Assessment: Defer to PT evaluation   Cervical / Trunk Assessment Cervical / Trunk Assessment: Other exceptions Cervical / Trunk Exceptions: s/p decompression L4-S1, forward head posture   Communication Communication Communication: No difficulties   Cognition Arousal/Alertness: Awake/alert Behavior During Therapy: WFL for tasks assessed/performed Overall Cognitive Status: Within Functional Limits for tasks assessed                                     General Comments  Presenting with slight decrease in awareness of body movements that break precautions. Requiring Min cues     Exercises     Shoulder Instructions      Home Living Family/patient expects to be  discharged to:: Private residence Living Arrangements: Spouse/significant other Available Help at Discharge: Family Type of Home: House Home Access: Stairs to enter Technical brewer of Steps: 3 Entrance Stairs-Rails: [place]: One level     Bathroom Shower/Tub: Occupational psychologist: Handicapped height     [place]:  ("has some of his wife's equipment")          Prior Functioning/Environment Level of Independence: Independent        Comments: Retired        Secretary/administrator Problem List: Decreased activity tolerance;Impaired balance (sitting and/or standing);Decreased knowledge of use of DME or AE;Decreased knowledge of  precautions      OT Treatment/Interventions:      OT Goals(Current goals can be found in the care plan section) Acute Rehab OT Goals Patient Stated Goal: less pain OT Goal Formulation: All assessment and education complete, DC therapy  OT Frequency:     Barriers to D/C:            Co-evaluation              AM-PAC OT "6 Clicks" Daily Activity     Outcome Measure Help from another person eating meals?: None Help from another person taking care of personal grooming?: None Help from another person toileting, which includes using toliet, bedpan, or urinal?: None Help from another person bathing (including washing, rinsing, drying)?: A Little Help from another person to put on and taking off regular upper body clothing?: None Help from another person to put on and taking off regular lower body clothing?: A Little 6 Click Score: 22   End of Session Nurse Communication: Mobility status  Activity Tolerance: Patient tolerated treatment well Patient left: in chair;with call bell/phone within reach  OT Visit Diagnosis: Unsteadiness on feet (R26.81);Other abnormalities of gait and mobility (R26.89);Muscle weakness (generalized) (M62.81)                Time: 5909-3112 OT Time Calculation (min): 15 min Charges:  OT General Charges $OT Visit: 1 Visit OT Evaluation $OT Eval Low Complexity: 1 Low  Orange Hilligoss MSOT, OTR/L Acute Rehab Pager: [phone] Office: [place] 04/24/2020, 8:58 AM

## 2020-04-24 NOTE — Discharge Summary (Signed)
Physician Discharge Summary   Patient ID: Norman Lambert MRN: [id number] DOB/AGE: [birth year] 69 y.o.  Admit date: 04/23/2020 Discharge date: 04/24/2020  Primary Diagnosis:   STENOSIS L4-S1  Admission Diagnoses:  Past Medical History:  Diagnosis Date  . Abnormal EKG   . Arthritis   . Essential hypertension 01/02/2017  . GERD (gastroesophageal reflux disease)   . Gout   . Hyperlipidemia 01/02/2017  . Hypertension   . Hypothyroidism   . Peripheral vascular disease ([place])   . RBBB 01/02/2017  . Rotator cuff tear   . Sleep apnea    Discharge Diagnoses:   Active Problems:   Spinal stenosis at L4-L5 level  Procedure:  Procedure(s) (LRB): MICRO LUMBER DECOMPRESSION LUMBAR FOUR-SACRAL ONE ON LEFT AND FORAMINOTOMY LUMBAR FIVE LEFT (Left)   Consults: None  HPI:  See H&p    Laboratory Data: Hospital Outpatient Visit on 04/21/2020  Component Date Value Ref Range Status  . SARS Coronavirus 2 04/21/2020 NEGATIVE  NEGATIVE Final   Comment: (NOTE) SARS-CoV-2 target nucleic acids are NOT DETECTED.  The SARS-CoV-2 RNA is generally detectable in upper and lower respiratory specimens during the acute phase of infection. Negative results do not preclude SARS-CoV-2 infection, do not rule out co-infections with other pathogens, and should not be used as the sole basis for treatment or other patient management decisions. Negative results must be combined with clinical observations, patient history, and epidemiological information. The expected result is Negative.  Fact Sheet for Patients: SugarRoll.be  Fact Sheet for Healthcare Providers: https://www.woods-mathews.com/  This test is not yet approved or cleared by the [place] FDA and  has been authorized for detection and/or diagnosis of SARS-CoV-2 by FDA under an Emergency Use Authorization (EUA). This EUA will remain  in effect (meaning this test can be used) for the duration of  the COVID-19 declaration under Se                          ction 564(b)(1) of the Act, 21 U.S.C. section 360bbb-3(b)(1), unless the authorization is terminated or revoked sooner.  Performed at [facility], [place] [address], [place], [place] [address]    Recent Labs    04/24/20 0825  HGB 10.5*   Recent Labs    04/24/20 0825  WBC 10.1  RBC 3.24*  HCT 31.8*  PLT 137*   Recent Labs    04/24/20 0825  NA 133*  K 3.4*  CL 98  CO2 22  BUN 11  CREATININE 0.86  GLUCOSE 195*  CALCIUM 8.7*   No results for input(s): LABPT, INR in the last 72 hours.  X-Rays:DG Lumbar Spine 2-3 Views  Result Date: 04/23/2020 CLINICAL DATA:  Localization images for lumbar decompression EXAM: LUMBAR SPINE - 2-3 VIEW COMPARISON:  Lumbar spine radiographs 04/21/2020 FINDINGS: Three lateral portable radiographs are presented for interpretation with limited coverage. Images centered over the lower lumbar spine. Degenerative changes in the lower lumbar spine are similar to previous imaging as expected with greatest changes at L4-5 and L5-S1 as before. Image labeled image 1 shows radiopaque markers directed at the sacrum, image 2 with radiopaque indicators directed at the posterior aspect of L5 and the upper margin of S1 with retractors projecting in this area as well. Final image, labeled image 3 shows a radiopaque indicator directed at the posterior aspect of the L5 vertebral body, projecting over the pedicles and facet joints. IMPRESSION: Intraoperative images with radiopaque indicator directed at the posterior aspect of  the L5 vertebral body on the final of 3 images as described above. Electronically Signed   By: Zetta Bills M.D.   On: 04/23/2020 11:44   DG Lumbar Spine 2-3 Views  Result Date: 04/21/2020 CLINICAL DATA:  Low back pain radiating the left leg EXAM: LUMBAR SPINE - 2 VIEW COMPARISON:  11/23/2017, 11/30/2019 FINDINGS: Five lumbar type vertebral bodies are well visualized. Numbering  nomenclature is standard. Vertebral body height is well maintained. Mild osteophytic changes are noted. Mild retrolisthesis of L2 on L3 and L3 on L4 is noted. Mild facet hypertrophic changes are seen. Disc space narrowing at L4-5 is noted. Aortic calcifications are seen without aneurysmal dilatation. IMPRESSION: Mild degenerative change as described.  No acute abnormality noted. Electronically Signed   By: Inez Catalina M.D.   On: 04/21/2020 09:04    EKG: Orders placed or performed during the hospital encounter of 04/21/20  . EKG 12 lead per protocol  . EKG 12 lead per protocol     Hospital Course: Patient was admitted to [facility] and taken to the OR and underwent the above state procedure without complications.  Patient tolerated the procedure well and was later transferred to the recovery room and then to the orthopaedic floor for postoperative care.  They were given PO and IV analgesics for pain control following their surgery.  They were given 24 hours of postoperative antibiotics.   PT was consulted postop to assist with mobility and transfers.  The patient was allowed to be WBAT with therapy and was taught back precautions. Discharge planning was consulted to help with postop disposition and equipment needs.  Patient had a good night on the evening of surgery and started to get up OOB with therapy on day one. Patient was seen in rounds and was ready to go home on day one.  They were given discharge instructions and dressing directions.  They were instructed on when to follow up in the office with Dr. Tonita Cong.   Diet: Regular diet Activity:WBAT, LSpine precautions Follow-up:in 10-14 days Disposition - Home Discharged Condition: good   Discharge Instructions    Call MD / Call 911   Complete by: As directed    If you experience chest pain or shortness of breath, CALL 911 and be transported to the hospital emergency room.  If you develope a fever above 101 F, pus (white drainage) or  increased drainage or redness at the wound, or calf pain, call your surgeon's office.   Constipation Prevention   Complete by: As directed    Drink plenty of fluids.  Prune juice may be helpful.  You may use a stool softener, such as Colace (over the counter) 100 mg twice a day.  Use MiraLax (over the counter) for constipation as needed.   Diet - low sodium heart healthy   Complete by: As directed    Increase activity slowly as tolerated   Complete by: As directed      Allergies as of 04/24/2020      Reactions   Other Rash, Other (See Comments)   " DARK DYES " [ UNSPECIFIED DYE ]      Medication List    STOP taking these medications   doxepin 25 MG capsule Commonly known as: SINEQUAN   MEGARED OMEGA-3 KRILL OIL PO   Qunol Ultra CoQ10 100-150 MG-UNIT Caps Generic drug: Coenzyme Q10-Vitamin E   rosuvastatin 5 MG tablet Commonly known as: CRESTOR     TAKE these medications   APPLE CIDER  VINEGAR PO Take 1,200 mg by mouth at bedtime.   ascorbic acid 500 MG tablet Commonly known as: VITAMIN C Take 500 mg by mouth in the morning and at bedtime.   docusate sodium 100 MG capsule Commonly known as: COLACE Take 1 capsule (100 mg total) by mouth 2 (two) times daily.   finasteride 5 MG tablet Commonly known as: PROSCAR Take 5 mg by mouth daily.   folic acid 1 MG tablet Commonly known as: FOLVITE Take 1 mg by mouth daily.   FRUIT & VEGETABLE DAILY PO Take 3 capsules by mouth in the morning and at bedtime. (3) Fruit in the morning & (3) Veggie at night.   hydrochlorothiazide 25 MG tablet Commonly known as: HYDRODIURIL Take 25 mg by mouth daily.   levocetirizine 5 MG tablet Commonly known as: XYZAL Take 5 mg by mouth at bedtime.   levothyroxine 112 MCG tablet Commonly known as: SYNTHROID Take 112 mcg by mouth daily before breakfast.   methotrexate 2.5 MG tablet Commonly known as: RHEUMATREX Take 15 mg by mouth every Saturday. 6 tablets once a week   modafinil 200  MG tablet Commonly known as: PROVIGIL Take 200 mg by mouth daily. In the morning   multivitamin with minerals Tabs tablet Take 1 tablet by mouth daily. One A Day for Men 50+   omeprazole 20 MG tablet Commonly known as: PRILOSEC OTC Take 20 mg by mouth daily before breakfast.   oxyCODONE 5 MG immediate release tablet Commonly known as: Oxy IR/ROXICODONE Take 1 tablet (5 mg total) by mouth every 4 (four) hours as needed for moderate pain ((score 4 to 6)).   polyethylene glycol 17 g packet Commonly known as: MIRALAX / GLYCOLAX Take 17 g by mouth daily as needed for mild constipation.   PROBIOTIC COLON SUPPORT PO Take 2 capsules by mouth daily in the afternoon. [employer]   tamsulosin 0.4 MG Caps capsule Commonly known as: FLOMAX Take 0.8 mg by mouth daily.   triamcinolone ointment 0.1 % Commonly known as: KENALOG Apply 1 application topically in the morning and at bedtime.   valsartan 80 MG tablet Commonly known as: DIOVAN Take 160 mg by mouth daily.   vitamin B-12 1000 MCG tablet Commonly known as: CYANOCOBALAMIN Take 1,000 mcg by mouth in the morning and at bedtime. Morning & afternoon   Vitamin D-3 125 MCG (5000 UT) Tabs Take 5,000 Units by mouth in the morning and at bedtime.   Zinc 50 MG Caps Take 50 mg by mouth daily. In the morning       Follow-up Information    Susa Day, MD Follow up in 2 week(s).   Specialty: Orthopedic Surgery Contact information: [address] [place] [address] [phone]               Signed: Lacie Draft, PA-C Orthopaedic Surgery 04/24/2020, 11:05 AM

## 2020-04-30 ENCOUNTER — Encounter (HOSPITAL_COMMUNITY): Payer: Self-pay | Admitting: Specialist

## 2020-05-07 DIAGNOSIS — N401 Enlarged prostate with lower urinary tract symptoms: Secondary | ICD-10-CM | POA: Diagnosis not present

## 2020-05-07 DIAGNOSIS — G4730 Sleep apnea, unspecified: Secondary | ICD-10-CM | POA: Diagnosis not present

## 2020-05-07 DIAGNOSIS — I10 Essential (primary) hypertension: Secondary | ICD-10-CM | POA: Diagnosis not present

## 2020-05-07 DIAGNOSIS — E782 Mixed hyperlipidemia: Secondary | ICD-10-CM | POA: Diagnosis not present

## 2020-05-07 DIAGNOSIS — J309 Allergic rhinitis, unspecified: Secondary | ICD-10-CM | POA: Diagnosis not present

## 2020-05-07 DIAGNOSIS — K219 Gastro-esophageal reflux disease without esophagitis: Secondary | ICD-10-CM | POA: Diagnosis not present

## 2020-05-07 DIAGNOSIS — E039 Hypothyroidism, unspecified: Secondary | ICD-10-CM | POA: Diagnosis not present

## 2020-05-07 DIAGNOSIS — Z125 Encounter for screening for malignant neoplasm of prostate: Secondary | ICD-10-CM | POA: Diagnosis not present

## 2020-05-11 ENCOUNTER — Ambulatory Visit
Admission: RE | Admit: 2020-05-11 | Discharge: 2020-05-11 | Disposition: A | Discharge status: Home / Self Care | Payer: Medicare HMO | Source: Ambulatory Visit | Attending: Urology | Admitting: Urology

## 2020-05-11 ENCOUNTER — Other Ambulatory Visit: Payer: Self-pay

## 2020-05-11 DIAGNOSIS — K760 Fatty (change of) liver, not elsewhere classified: Secondary | ICD-10-CM | POA: Diagnosis not present

## 2020-05-11 DIAGNOSIS — N281 Cyst of kidney, acquired: Secondary | ICD-10-CM | POA: Diagnosis not present

## 2020-05-11 MED ORDER — GADOBENATE DIMEGLUMINE 529 MG/ML IV SOLN
20.0000 mL | Freq: Once | INTRAVENOUS | Status: AC | PRN
  Administered 2020-05-11: 20 mL via INTRAVENOUS

## 2020-05-18 DIAGNOSIS — L539 Erythematous condition, unspecified: Secondary | ICD-10-CM | POA: Diagnosis not present

## 2020-05-18 DIAGNOSIS — Z5181 Encounter for therapeutic drug level monitoring: Secondary | ICD-10-CM | POA: Diagnosis not present

## 2020-05-18 DIAGNOSIS — L409 Psoriasis, unspecified: Secondary | ICD-10-CM | POA: Diagnosis not present

## 2020-05-25 DIAGNOSIS — L409 Psoriasis, unspecified: Secondary | ICD-10-CM | POA: Diagnosis not present

## 2020-05-25 DIAGNOSIS — Z79899 Other long term (current) drug therapy: Secondary | ICD-10-CM | POA: Diagnosis not present

## 2020-05-25 DIAGNOSIS — Z5181 Encounter for therapeutic drug level monitoring: Secondary | ICD-10-CM | POA: Diagnosis not present

## 2020-05-26 DIAGNOSIS — N281 Cyst of kidney, acquired: Secondary | ICD-10-CM | POA: Diagnosis not present

## 2020-05-26 DIAGNOSIS — R351 Nocturia: Secondary | ICD-10-CM | POA: Diagnosis not present

## 2020-05-26 DIAGNOSIS — N401 Enlarged prostate with lower urinary tract symptoms: Secondary | ICD-10-CM | POA: Diagnosis not present

## 2020-05-29 DIAGNOSIS — M5416 Radiculopathy, lumbar region: Secondary | ICD-10-CM | POA: Diagnosis not present

## 2020-06-05 DIAGNOSIS — E782 Mixed hyperlipidemia: Secondary | ICD-10-CM | POA: Diagnosis not present

## 2020-06-05 DIAGNOSIS — N401 Enlarged prostate with lower urinary tract symptoms: Secondary | ICD-10-CM | POA: Diagnosis not present

## 2020-06-05 DIAGNOSIS — I10 Essential (primary) hypertension: Secondary | ICD-10-CM | POA: Diagnosis not present

## 2020-06-05 DIAGNOSIS — J449 Chronic obstructive pulmonary disease, unspecified: Secondary | ICD-10-CM | POA: Diagnosis not present

## 2020-06-05 DIAGNOSIS — E039 Hypothyroidism, unspecified: Secondary | ICD-10-CM | POA: Diagnosis not present

## 2020-06-09 DIAGNOSIS — L308 Other specified dermatitis: Secondary | ICD-10-CM | POA: Diagnosis not present

## 2020-06-11 ENCOUNTER — Other Ambulatory Visit: Payer: Self-pay

## 2020-06-11 ENCOUNTER — Telehealth: Payer: Self-pay | Admitting: Dermatology

## 2020-06-11 NOTE — Telephone Encounter (Signed)
Message left for patient to call Dr Lyman Speller he is the  Dr that gave him the methotrexate and advised the folic acid. Not Dr Denna Haggard

## 2020-06-11 NOTE — Telephone Encounter (Signed)
He's switching pharmacies to [facility] (mail order) in [place]. They need tansulosin Rx + finasteride Rx transferred there. Call was from [place], who works with chronic care team. Has scheduled delivery for today, needs it today, but pharmacy says it hasn't been transferred over yet.

## 2020-06-11 NOTE — Telephone Encounter (Signed)
Caren Griffins called back because she requested refills on the wrong medications. They need refill for Methotrexate only.

## 2020-06-11 NOTE — Telephone Encounter (Signed)
Also needs Folic acid Rx 1 mg switched to  [facility] along with  the methotrexate

## 2020-07-13 DIAGNOSIS — N401 Enlarged prostate with lower urinary tract symptoms: Secondary | ICD-10-CM | POA: Diagnosis not present

## 2020-07-13 DIAGNOSIS — K219 Gastro-esophageal reflux disease without esophagitis: Secondary | ICD-10-CM | POA: Diagnosis not present

## 2020-07-13 DIAGNOSIS — E782 Mixed hyperlipidemia: Secondary | ICD-10-CM | POA: Diagnosis not present

## 2020-07-13 DIAGNOSIS — J449 Chronic obstructive pulmonary disease, unspecified: Secondary | ICD-10-CM | POA: Diagnosis not present

## 2020-07-13 DIAGNOSIS — L409 Psoriasis, unspecified: Secondary | ICD-10-CM | POA: Diagnosis not present

## 2020-07-13 DIAGNOSIS — Z79899 Other long term (current) drug therapy: Secondary | ICD-10-CM | POA: Diagnosis not present

## 2020-07-13 DIAGNOSIS — E039 Hypothyroidism, unspecified: Secondary | ICD-10-CM | POA: Diagnosis not present

## 2020-07-13 DIAGNOSIS — Z5181 Encounter for therapeutic drug level monitoring: Secondary | ICD-10-CM | POA: Diagnosis not present

## 2020-07-13 DIAGNOSIS — I10 Essential (primary) hypertension: Secondary | ICD-10-CM | POA: Diagnosis not present

## 2020-07-17 DIAGNOSIS — Z4889 Encounter for other specified surgical aftercare: Secondary | ICD-10-CM | POA: Diagnosis not present

## 2020-07-17 DIAGNOSIS — Z5189 Encounter for other specified aftercare: Secondary | ICD-10-CM | POA: Diagnosis not present

## 2020-07-25 DIAGNOSIS — M5416 Radiculopathy, lumbar region: Secondary | ICD-10-CM | POA: Diagnosis not present

## 2020-07-30 DIAGNOSIS — M5416 Radiculopathy, lumbar region: Secondary | ICD-10-CM | POA: Diagnosis not present

## 2020-08-09 DIAGNOSIS — I10 Essential (primary) hypertension: Secondary | ICD-10-CM | POA: Diagnosis not present

## 2020-08-09 DIAGNOSIS — E782 Mixed hyperlipidemia: Secondary | ICD-10-CM | POA: Diagnosis not present

## 2020-08-09 DIAGNOSIS — K219 Gastro-esophageal reflux disease without esophagitis: Secondary | ICD-10-CM | POA: Diagnosis not present

## 2020-08-09 DIAGNOSIS — J449 Chronic obstructive pulmonary disease, unspecified: Secondary | ICD-10-CM | POA: Diagnosis not present

## 2020-08-09 DIAGNOSIS — N401 Enlarged prostate with lower urinary tract symptoms: Secondary | ICD-10-CM | POA: Diagnosis not present

## 2020-08-09 DIAGNOSIS — E039 Hypothyroidism, unspecified: Secondary | ICD-10-CM | POA: Diagnosis not present

## 2020-08-19 DIAGNOSIS — M5416 Radiculopathy, lumbar region: Secondary | ICD-10-CM | POA: Diagnosis not present

## 2020-08-19 DIAGNOSIS — M48061 Spinal stenosis, lumbar region without neurogenic claudication: Secondary | ICD-10-CM | POA: Diagnosis not present

## 2020-08-19 DIAGNOSIS — M5136 Other intervertebral disc degeneration, lumbar region: Secondary | ICD-10-CM | POA: Diagnosis not present

## 2020-08-20 DIAGNOSIS — L239 Allergic contact dermatitis, unspecified cause: Secondary | ICD-10-CM | POA: Diagnosis not present

## 2020-08-20 DIAGNOSIS — Z5181 Encounter for therapeutic drug level monitoring: Secondary | ICD-10-CM | POA: Diagnosis not present

## 2020-08-20 DIAGNOSIS — L409 Psoriasis, unspecified: Secondary | ICD-10-CM | POA: Diagnosis not present

## 2020-08-20 DIAGNOSIS — Z872 Personal history of diseases of the skin and subcutaneous tissue: Secondary | ICD-10-CM | POA: Diagnosis not present

## 2020-09-02 DIAGNOSIS — K219 Gastro-esophageal reflux disease without esophagitis: Secondary | ICD-10-CM | POA: Diagnosis not present

## 2020-09-02 DIAGNOSIS — N401 Enlarged prostate with lower urinary tract symptoms: Secondary | ICD-10-CM | POA: Diagnosis not present

## 2020-09-02 DIAGNOSIS — J449 Chronic obstructive pulmonary disease, unspecified: Secondary | ICD-10-CM | POA: Diagnosis not present

## 2020-09-02 DIAGNOSIS — E782 Mixed hyperlipidemia: Secondary | ICD-10-CM | POA: Diagnosis not present

## 2020-09-02 DIAGNOSIS — E039 Hypothyroidism, unspecified: Secondary | ICD-10-CM | POA: Diagnosis not present

## 2020-09-02 DIAGNOSIS — I10 Essential (primary) hypertension: Secondary | ICD-10-CM | POA: Diagnosis not present

## 2020-09-03 DIAGNOSIS — I10 Essential (primary) hypertension: Secondary | ICD-10-CM | POA: Diagnosis not present

## 2020-09-03 DIAGNOSIS — M5440 Lumbago with sciatica, unspecified side: Secondary | ICD-10-CM | POA: Diagnosis not present

## 2020-09-04 DIAGNOSIS — M5416 Radiculopathy, lumbar region: Secondary | ICD-10-CM | POA: Diagnosis not present

## 2020-09-04 DIAGNOSIS — M48061 Spinal stenosis, lumbar region without neurogenic claudication: Secondary | ICD-10-CM | POA: Diagnosis not present

## 2020-09-21 DIAGNOSIS — L308 Other specified dermatitis: Secondary | ICD-10-CM | POA: Diagnosis not present

## 2020-09-21 DIAGNOSIS — L309 Dermatitis, unspecified: Secondary | ICD-10-CM | POA: Diagnosis not present

## 2020-09-21 DIAGNOSIS — L409 Psoriasis, unspecified: Secondary | ICD-10-CM | POA: Diagnosis not present

## 2020-09-21 DIAGNOSIS — Z79899 Other long term (current) drug therapy: Secondary | ICD-10-CM | POA: Diagnosis not present

## 2020-09-21 DIAGNOSIS — Z7952 Long term (current) use of systemic steroids: Secondary | ICD-10-CM | POA: Diagnosis not present

## 2020-09-21 DIAGNOSIS — Z872 Personal history of diseases of the skin and subcutaneous tissue: Secondary | ICD-10-CM | POA: Diagnosis not present

## 2020-09-21 DIAGNOSIS — Z5181 Encounter for therapeutic drug level monitoring: Secondary | ICD-10-CM | POA: Diagnosis not present

## 2020-10-14 ENCOUNTER — Other Ambulatory Visit: Payer: Self-pay | Admitting: Urology

## 2020-10-14 ENCOUNTER — Other Ambulatory Visit (HOSPITAL_COMMUNITY): Payer: Self-pay | Admitting: Urology

## 2020-10-14 DIAGNOSIS — N281 Cyst of kidney, acquired: Secondary | ICD-10-CM

## 2020-11-04 DIAGNOSIS — L259 Unspecified contact dermatitis, unspecified cause: Secondary | ICD-10-CM | POA: Diagnosis not present

## 2020-11-05 DIAGNOSIS — J449 Chronic obstructive pulmonary disease, unspecified: Secondary | ICD-10-CM | POA: Diagnosis not present

## 2020-11-05 DIAGNOSIS — E039 Hypothyroidism, unspecified: Secondary | ICD-10-CM | POA: Diagnosis not present

## 2020-11-05 DIAGNOSIS — J309 Allergic rhinitis, unspecified: Secondary | ICD-10-CM | POA: Diagnosis not present

## 2020-11-05 DIAGNOSIS — N401 Enlarged prostate with lower urinary tract symptoms: Secondary | ICD-10-CM | POA: Diagnosis not present

## 2020-11-05 DIAGNOSIS — G4730 Sleep apnea, unspecified: Secondary | ICD-10-CM | POA: Diagnosis not present

## 2020-11-05 DIAGNOSIS — I10 Essential (primary) hypertension: Secondary | ICD-10-CM | POA: Diagnosis not present

## 2020-11-05 DIAGNOSIS — E782 Mixed hyperlipidemia: Secondary | ICD-10-CM | POA: Diagnosis not present

## 2020-11-05 DIAGNOSIS — K219 Gastro-esophageal reflux disease without esophagitis: Secondary | ICD-10-CM | POA: Diagnosis not present

## 2020-11-05 DIAGNOSIS — Z0000 Encounter for general adult medical examination without abnormal findings: Secondary | ICD-10-CM | POA: Diagnosis not present

## 2020-11-09 DIAGNOSIS — Z79899 Other long term (current) drug therapy: Secondary | ICD-10-CM | POA: Diagnosis not present

## 2020-11-09 DIAGNOSIS — L259 Unspecified contact dermatitis, unspecified cause: Secondary | ICD-10-CM | POA: Diagnosis not present

## 2020-11-11 DIAGNOSIS — L259 Unspecified contact dermatitis, unspecified cause: Secondary | ICD-10-CM | POA: Diagnosis not present

## 2020-11-12 DIAGNOSIS — L232 Allergic contact dermatitis due to cosmetics: Secondary | ICD-10-CM | POA: Diagnosis not present

## 2020-11-12 DIAGNOSIS — Z0182 Encounter for allergy testing: Secondary | ICD-10-CM | POA: Diagnosis not present

## 2020-11-16 ENCOUNTER — Ambulatory Visit (HOSPITAL_COMMUNITY)
Admission: RE | Admit: 2020-11-16 | Discharge: 2020-11-16 | Disposition: A | Discharge status: Home / Self Care | Payer: Medicare HMO | Source: Ambulatory Visit | Attending: Urology | Admitting: Urology

## 2020-11-16 ENCOUNTER — Other Ambulatory Visit: Payer: Self-pay

## 2020-11-16 DIAGNOSIS — N281 Cyst of kidney, acquired: Secondary | ICD-10-CM | POA: Insufficient documentation

## 2020-11-16 MED ORDER — GADOBUTROL 1 MMOL/ML IV SOLN
10.0000 mL | Freq: Once | INTRAVENOUS | Status: AC | PRN
  Administered 2020-11-16: 10 mL via INTRAVENOUS

## 2020-11-19 DIAGNOSIS — Z872 Personal history of diseases of the skin and subcutaneous tissue: Secondary | ICD-10-CM | POA: Diagnosis not present

## 2020-11-19 DIAGNOSIS — L259 Unspecified contact dermatitis, unspecified cause: Secondary | ICD-10-CM | POA: Diagnosis not present

## 2020-11-19 DIAGNOSIS — L409 Psoriasis, unspecified: Secondary | ICD-10-CM | POA: Diagnosis not present

## 2020-11-19 DIAGNOSIS — L232 Allergic contact dermatitis due to cosmetics: Secondary | ICD-10-CM | POA: Diagnosis not present

## 2020-11-19 DIAGNOSIS — Z5181 Encounter for therapeutic drug level monitoring: Secondary | ICD-10-CM | POA: Diagnosis not present

## 2020-11-23 DIAGNOSIS — N401 Enlarged prostate with lower urinary tract symptoms: Secondary | ICD-10-CM | POA: Diagnosis not present

## 2020-11-23 DIAGNOSIS — R350 Frequency of micturition: Secondary | ICD-10-CM | POA: Diagnosis not present

## 2020-11-23 DIAGNOSIS — N281 Cyst of kidney, acquired: Secondary | ICD-10-CM | POA: Diagnosis not present

## 2020-11-23 DIAGNOSIS — R351 Nocturia: Secondary | ICD-10-CM | POA: Diagnosis not present

## 2020-11-24 DIAGNOSIS — M5136 Other intervertebral disc degeneration, lumbar region: Secondary | ICD-10-CM | POA: Diagnosis not present

## 2020-11-28 DIAGNOSIS — Z7982 Long term (current) use of aspirin: Secondary | ICD-10-CM | POA: Diagnosis not present

## 2020-11-28 DIAGNOSIS — W270XXA Contact with workbench tool, initial encounter: Secondary | ICD-10-CM | POA: Diagnosis not present

## 2020-11-28 DIAGNOSIS — I10 Essential (primary) hypertension: Secondary | ICD-10-CM | POA: Diagnosis not present

## 2020-11-28 DIAGNOSIS — S71112A Laceration without foreign body, left thigh, initial encounter: Secondary | ICD-10-CM | POA: Diagnosis not present

## 2020-11-28 DIAGNOSIS — R031 Nonspecific low blood-pressure reading: Secondary | ICD-10-CM | POA: Diagnosis not present

## 2020-11-28 DIAGNOSIS — S81811A Laceration without foreign body, right lower leg, initial encounter: Secondary | ICD-10-CM | POA: Diagnosis not present

## 2020-11-28 DIAGNOSIS — Z23 Encounter for immunization: Secondary | ICD-10-CM | POA: Diagnosis not present

## 2020-11-28 DIAGNOSIS — Z79899 Other long term (current) drug therapy: Secondary | ICD-10-CM | POA: Diagnosis not present

## 2020-11-28 DIAGNOSIS — Z87891 Personal history of nicotine dependence: Secondary | ICD-10-CM | POA: Diagnosis not present

## 2020-11-30 DIAGNOSIS — S81819A Laceration without foreign body, unspecified lower leg, initial encounter: Secondary | ICD-10-CM | POA: Diagnosis not present

## 2020-12-04 DIAGNOSIS — E039 Hypothyroidism, unspecified: Secondary | ICD-10-CM | POA: Diagnosis not present

## 2020-12-04 DIAGNOSIS — K219 Gastro-esophageal reflux disease without esophagitis: Secondary | ICD-10-CM | POA: Diagnosis not present

## 2020-12-04 DIAGNOSIS — E782 Mixed hyperlipidemia: Secondary | ICD-10-CM | POA: Diagnosis not present

## 2020-12-04 DIAGNOSIS — J449 Chronic obstructive pulmonary disease, unspecified: Secondary | ICD-10-CM | POA: Diagnosis not present

## 2020-12-04 DIAGNOSIS — I10 Essential (primary) hypertension: Secondary | ICD-10-CM | POA: Diagnosis not present

## 2020-12-04 DIAGNOSIS — N401 Enlarged prostate with lower urinary tract symptoms: Secondary | ICD-10-CM | POA: Diagnosis not present

## 2020-12-10 DIAGNOSIS — S81819A Laceration without foreign body, unspecified lower leg, initial encounter: Secondary | ICD-10-CM | POA: Diagnosis not present

## 2020-12-18 DIAGNOSIS — H26491 Other secondary cataract, right eye: Secondary | ICD-10-CM | POA: Diagnosis not present

## 2020-12-18 DIAGNOSIS — H16223 Keratoconjunctivitis sicca, not specified as Sjogren's, bilateral: Secondary | ICD-10-CM | POA: Diagnosis not present

## 2020-12-28 DIAGNOSIS — H26491 Other secondary cataract, right eye: Secondary | ICD-10-CM | POA: Diagnosis not present

## 2021-01-05 DIAGNOSIS — M5136 Other intervertebral disc degeneration, lumbar region: Secondary | ICD-10-CM | POA: Diagnosis not present

## 2021-01-05 DIAGNOSIS — M5416 Radiculopathy, lumbar region: Secondary | ICD-10-CM | POA: Diagnosis not present

## 2021-01-05 DIAGNOSIS — M48061 Spinal stenosis, lumbar region without neurogenic claudication: Secondary | ICD-10-CM | POA: Diagnosis not present

## 2021-02-09 DIAGNOSIS — E039 Hypothyroidism, unspecified: Secondary | ICD-10-CM | POA: Diagnosis not present

## 2021-02-09 DIAGNOSIS — I10 Essential (primary) hypertension: Secondary | ICD-10-CM | POA: Diagnosis not present

## 2021-02-09 DIAGNOSIS — E782 Mixed hyperlipidemia: Secondary | ICD-10-CM | POA: Diagnosis not present

## 2021-02-09 DIAGNOSIS — K219 Gastro-esophageal reflux disease without esophagitis: Secondary | ICD-10-CM | POA: Diagnosis not present

## 2021-02-09 DIAGNOSIS — N401 Enlarged prostate with lower urinary tract symptoms: Secondary | ICD-10-CM | POA: Diagnosis not present

## 2021-02-09 DIAGNOSIS — J449 Chronic obstructive pulmonary disease, unspecified: Secondary | ICD-10-CM | POA: Diagnosis not present

## 2021-02-17 DIAGNOSIS — G894 Chronic pain syndrome: Secondary | ICD-10-CM | POA: Diagnosis not present

## 2021-02-19 IMAGING — CR DG LUMBAR SPINE 2-3V
2 series · 2 of 2 positions shown · non-contrast
Comparison: 11/23/2017, 11/30/2019

CLINICAL DATA: Low back pain radiating the left leg

EXAM:
LUMBAR SPINE - 2 VIEW

[w lumbar spine ap]
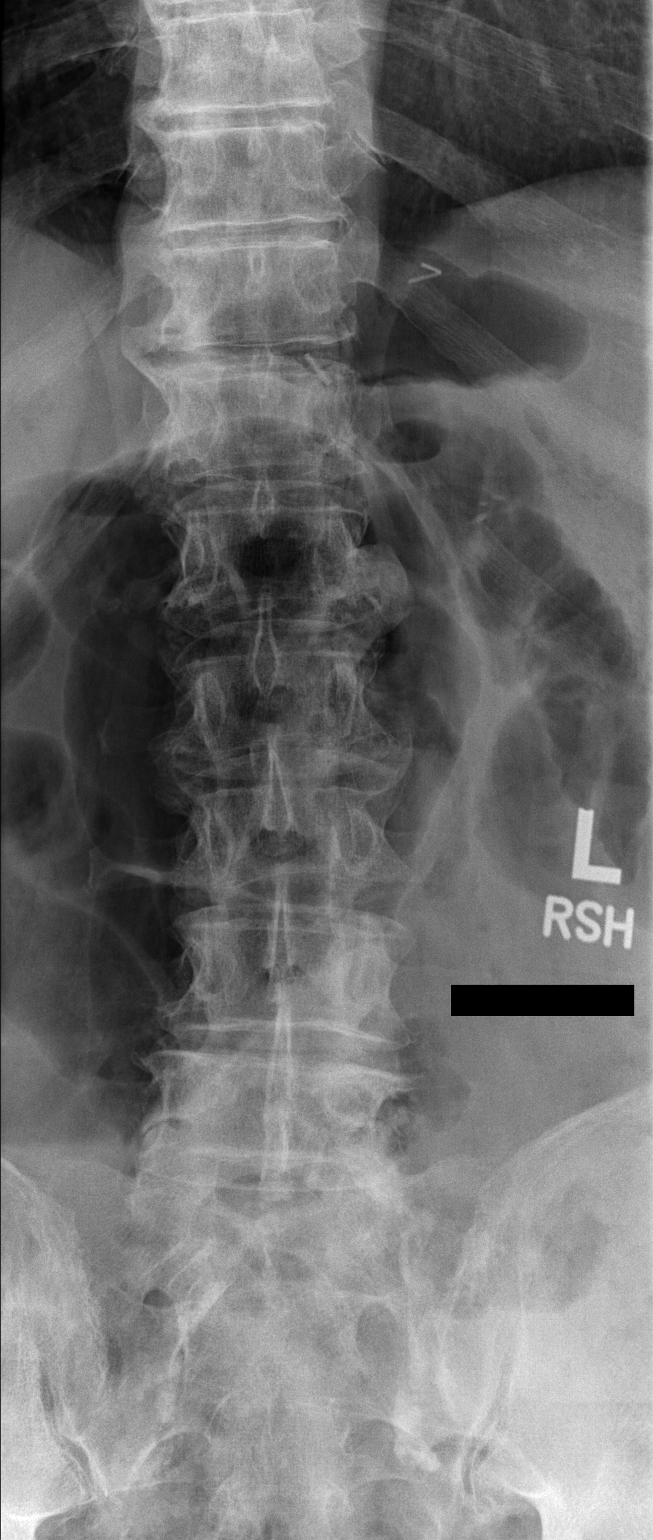

[w lumbar spine lat]
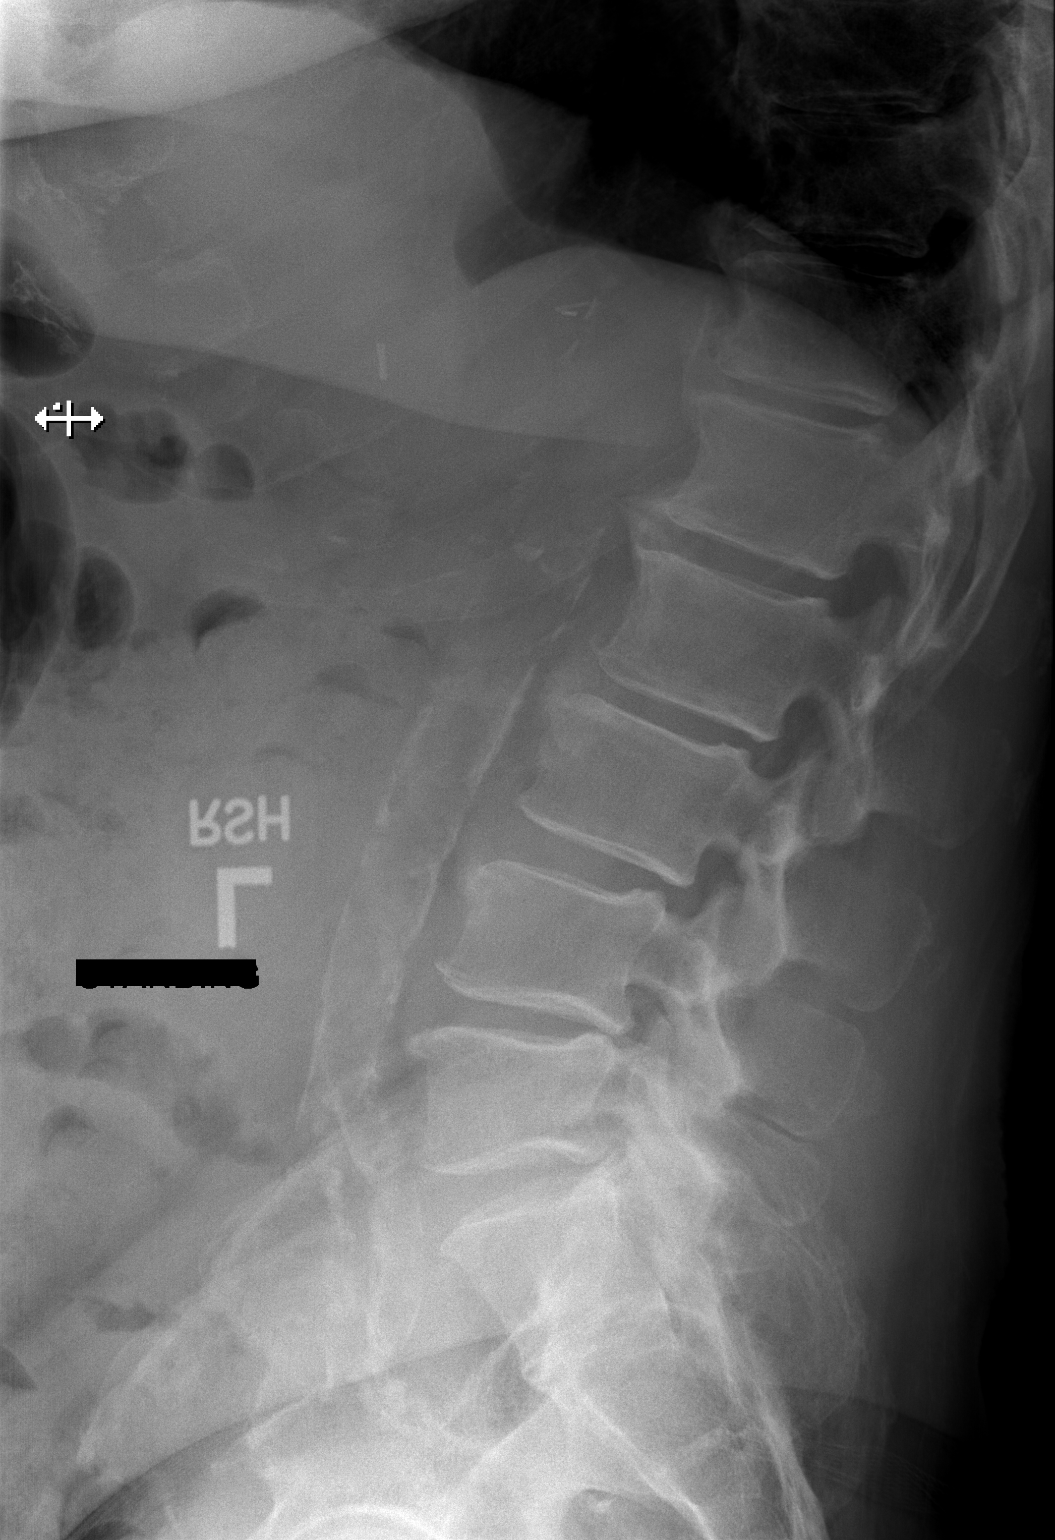

[2 of 2 positions shown; findings below may reference images not displayed]

FINDINGS: Five lumbar type vertebral bodies are well visualized. Numbering
nomenclature is standard. Vertebral body height is well maintained.
Mild osteophytic changes are noted. Mild retrolisthesis of L2 on L3
and L3 on L4 is noted. Mild facet hypertrophic changes are seen.
Disc space narrowing at L4-5 is noted. Aortic calcifications are
seen without aneurysmal dilatation.
IMPRESSION: Mild degenerative change as described.  No acute abnormality noted.

## 2021-02-21 IMAGING — CR DG LUMBAR SPINE 2-3V
3 series · 3 of 3 positions shown · non-contrast
Comparison: Lumbar spine radiographs 04/21/2020

CLINICAL DATA: Localization images for lumbar decompression

EXAM:
LUMBAR SPINE - 2-3 VIEW

[lateral (1 of 3)]
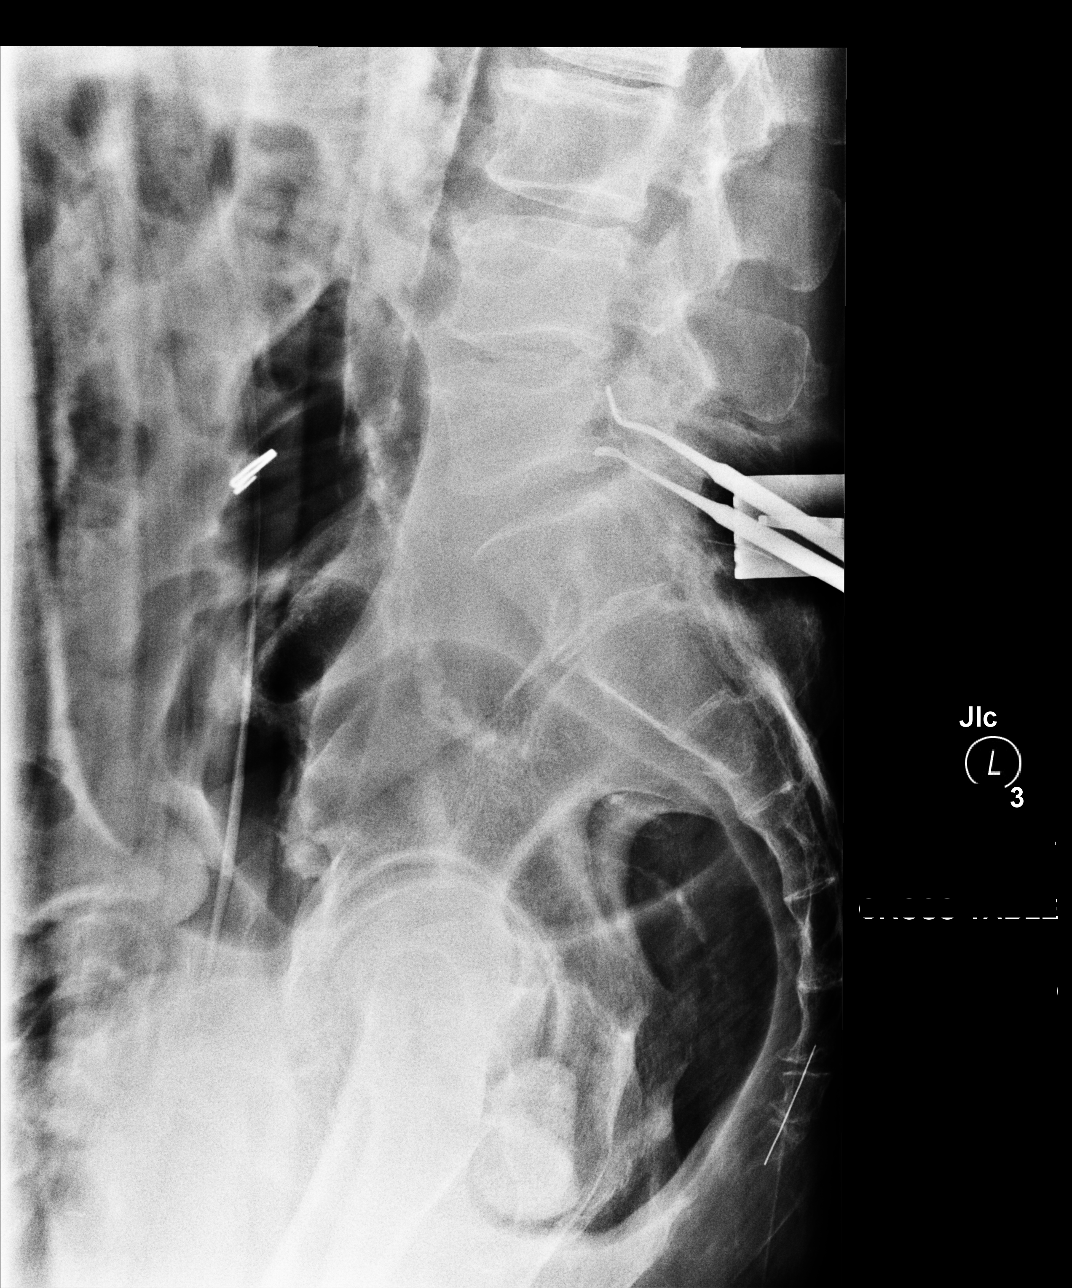

[lateral (2 of 3)]
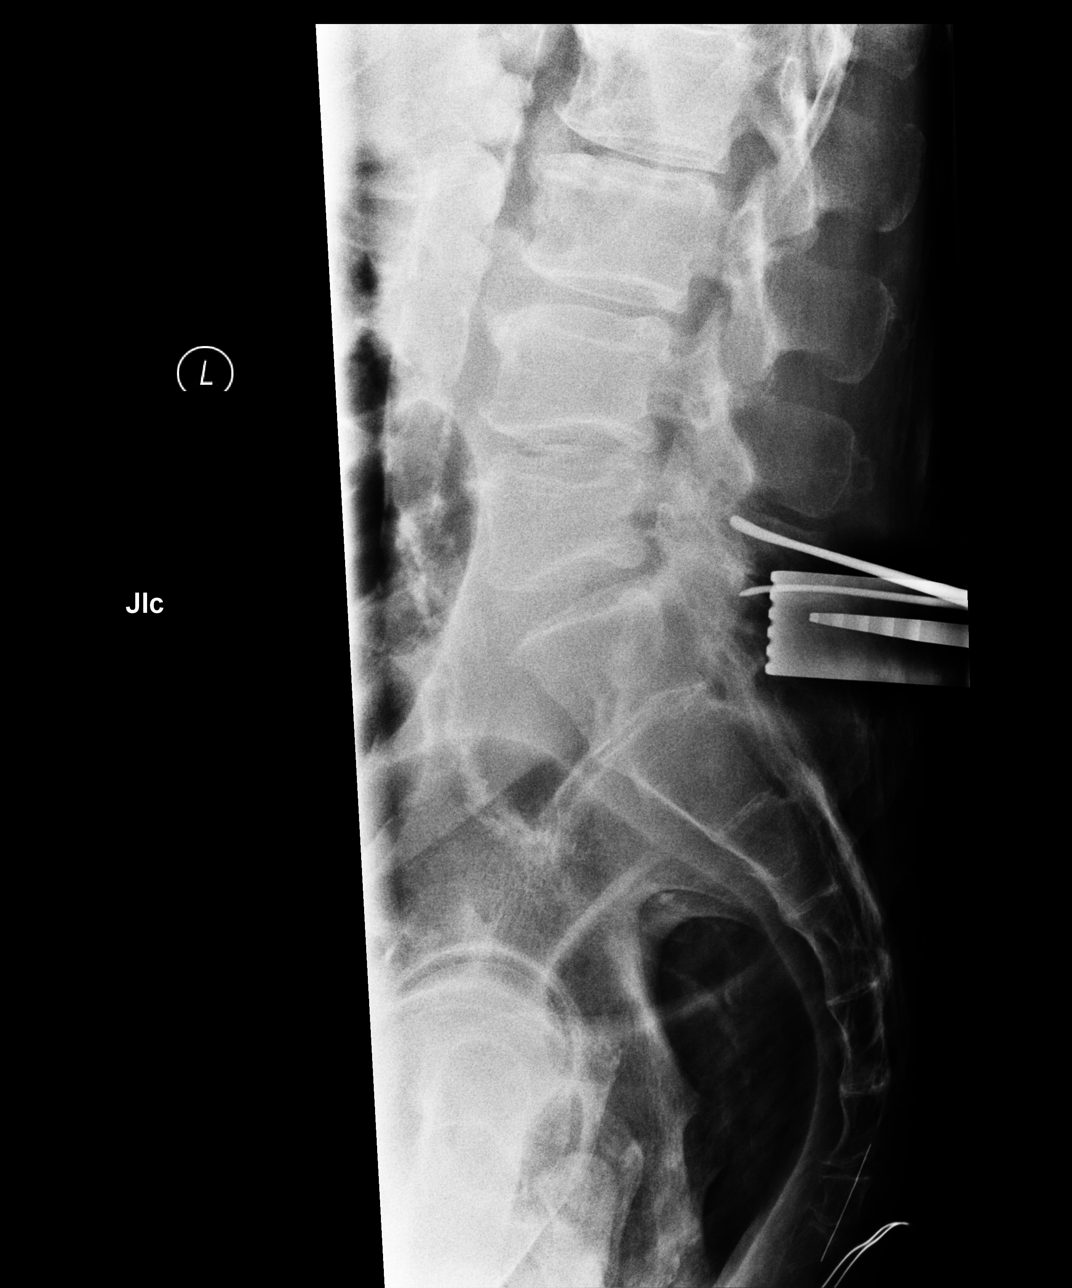

[lateral (3 of 3)]
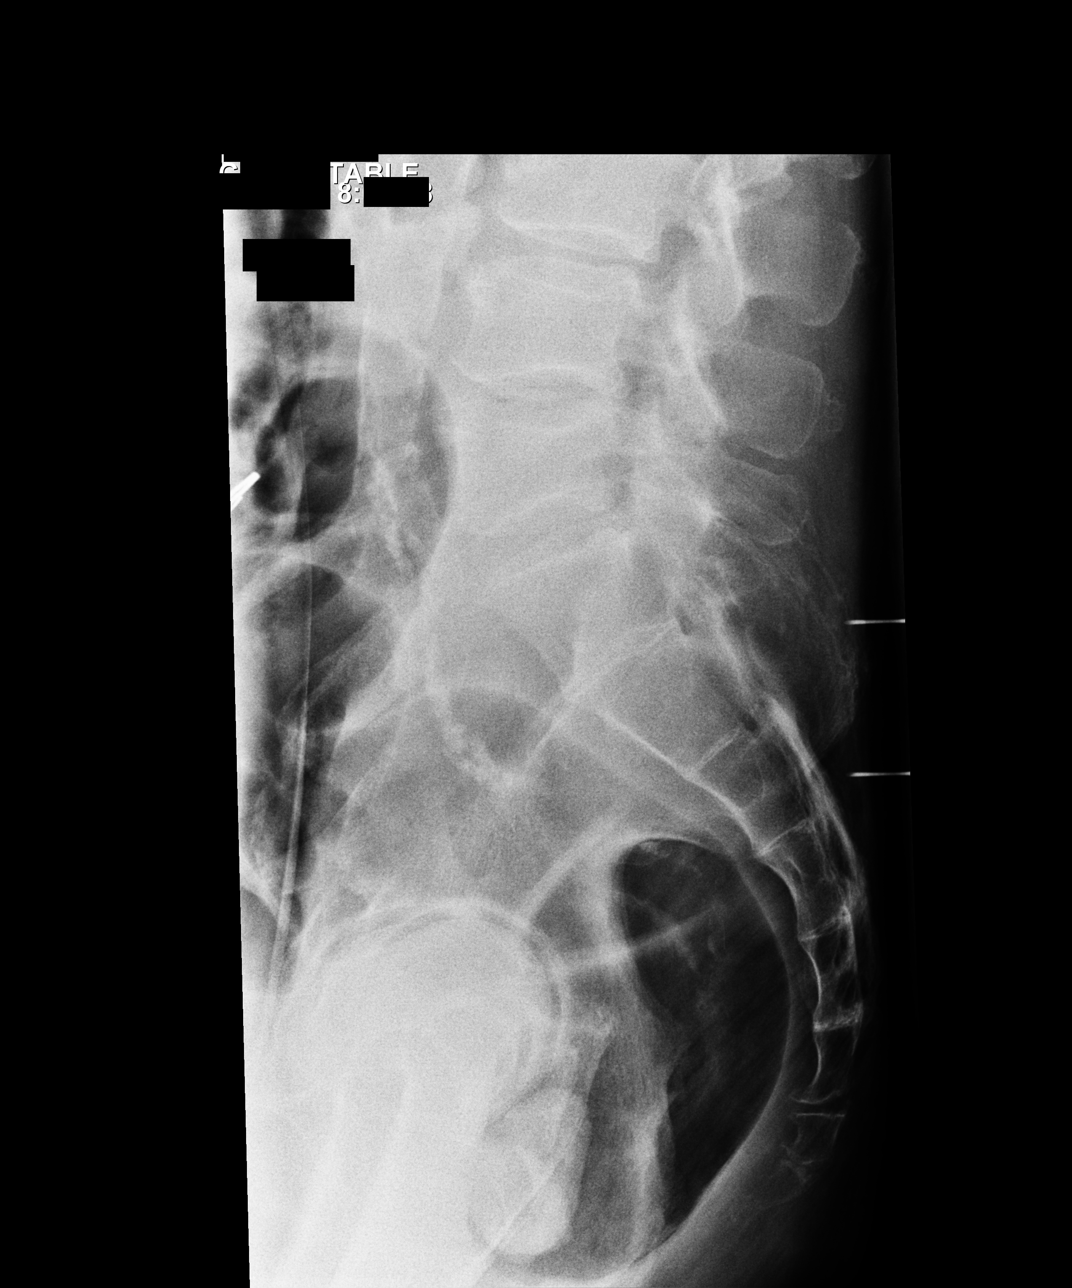

[3 of 3 positions shown; findings below may reference images not displayed]

FINDINGS: Three lateral portable radiographs are presented for interpretation
with limited coverage.

Images centered over the lower lumbar spine.

Degenerative changes in the lower lumbar spine are similar to
previous imaging as expected with greatest changes at L4-5 and L5-S1
as before.

Image labeled image 1 shows radiopaque markers directed at the
sacrum, image 2 with radiopaque indicators directed at the posterior
aspect of L5 and the upper margin of S1 with retractors projecting
in this area as well.

Final image, labeled image 3 shows a radiopaque indicator directed
at the posterior aspect of the L5 vertebral body, projecting over
the pedicles and facet joints.
IMPRESSION: Intraoperative images with radiopaque indicator directed at the
posterior aspect of the L5 vertebral body on the final of 3 images
as described above.

## 2021-03-03 DIAGNOSIS — E782 Mixed hyperlipidemia: Secondary | ICD-10-CM | POA: Diagnosis not present

## 2021-03-03 DIAGNOSIS — E039 Hypothyroidism, unspecified: Secondary | ICD-10-CM | POA: Diagnosis not present

## 2021-03-03 DIAGNOSIS — N401 Enlarged prostate with lower urinary tract symptoms: Secondary | ICD-10-CM | POA: Diagnosis not present

## 2021-03-03 DIAGNOSIS — K219 Gastro-esophageal reflux disease without esophagitis: Secondary | ICD-10-CM | POA: Diagnosis not present

## 2021-03-03 DIAGNOSIS — J449 Chronic obstructive pulmonary disease, unspecified: Secondary | ICD-10-CM | POA: Diagnosis not present

## 2021-03-03 DIAGNOSIS — I10 Essential (primary) hypertension: Secondary | ICD-10-CM | POA: Diagnosis not present

## 2021-04-07 DIAGNOSIS — M5416 Radiculopathy, lumbar region: Secondary | ICD-10-CM | POA: Diagnosis not present

## 2021-04-07 DIAGNOSIS — M48061 Spinal stenosis, lumbar region without neurogenic claudication: Secondary | ICD-10-CM | POA: Diagnosis not present

## 2021-04-07 DIAGNOSIS — M5136 Other intervertebral disc degeneration, lumbar region: Secondary | ICD-10-CM | POA: Diagnosis not present

## 2021-05-06 DIAGNOSIS — J449 Chronic obstructive pulmonary disease, unspecified: Secondary | ICD-10-CM | POA: Diagnosis not present

## 2021-05-06 DIAGNOSIS — N401 Enlarged prostate with lower urinary tract symptoms: Secondary | ICD-10-CM | POA: Diagnosis not present

## 2021-05-06 DIAGNOSIS — I10 Essential (primary) hypertension: Secondary | ICD-10-CM | POA: Diagnosis not present

## 2021-05-06 DIAGNOSIS — E039 Hypothyroidism, unspecified: Secondary | ICD-10-CM | POA: Diagnosis not present

## 2021-05-06 DIAGNOSIS — E782 Mixed hyperlipidemia: Secondary | ICD-10-CM | POA: Diagnosis not present

## 2021-05-06 DIAGNOSIS — K219 Gastro-esophageal reflux disease without esophagitis: Secondary | ICD-10-CM | POA: Diagnosis not present

## 2021-05-06 DIAGNOSIS — J309 Allergic rhinitis, unspecified: Secondary | ICD-10-CM | POA: Diagnosis not present

## 2021-05-06 DIAGNOSIS — G4730 Sleep apnea, unspecified: Secondary | ICD-10-CM | POA: Diagnosis not present

## 2021-05-07 DIAGNOSIS — M5136 Other intervertebral disc degeneration, lumbar region: Secondary | ICD-10-CM | POA: Diagnosis not present

## 2021-05-07 DIAGNOSIS — M48061 Spinal stenosis, lumbar region without neurogenic claudication: Secondary | ICD-10-CM | POA: Diagnosis not present

## 2021-05-07 DIAGNOSIS — M5416 Radiculopathy, lumbar region: Secondary | ICD-10-CM | POA: Diagnosis not present

## 2021-05-31 DIAGNOSIS — K219 Gastro-esophageal reflux disease without esophagitis: Secondary | ICD-10-CM | POA: Diagnosis not present

## 2021-05-31 DIAGNOSIS — E039 Hypothyroidism, unspecified: Secondary | ICD-10-CM | POA: Diagnosis not present

## 2021-05-31 DIAGNOSIS — E782 Mixed hyperlipidemia: Secondary | ICD-10-CM | POA: Diagnosis not present

## 2021-05-31 DIAGNOSIS — N401 Enlarged prostate with lower urinary tract symptoms: Secondary | ICD-10-CM | POA: Diagnosis not present

## 2021-05-31 DIAGNOSIS — I10 Essential (primary) hypertension: Secondary | ICD-10-CM | POA: Diagnosis not present

## 2021-05-31 DIAGNOSIS — J449 Chronic obstructive pulmonary disease, unspecified: Secondary | ICD-10-CM | POA: Diagnosis not present

## 2021-06-07 DIAGNOSIS — L2089 Other atopic dermatitis: Secondary | ICD-10-CM | POA: Diagnosis not present

## 2021-06-07 DIAGNOSIS — G8929 Other chronic pain: Secondary | ICD-10-CM | POA: Diagnosis not present

## 2021-06-07 DIAGNOSIS — L309 Dermatitis, unspecified: Secondary | ICD-10-CM | POA: Diagnosis not present

## 2021-06-07 DIAGNOSIS — Z79899 Other long term (current) drug therapy: Secondary | ICD-10-CM | POA: Diagnosis not present

## 2021-06-07 DIAGNOSIS — Z5181 Encounter for therapeutic drug level monitoring: Secondary | ICD-10-CM | POA: Diagnosis not present

## 2021-06-07 DIAGNOSIS — L209 Atopic dermatitis, unspecified: Secondary | ICD-10-CM | POA: Diagnosis not present

## 2021-06-07 DIAGNOSIS — Z872 Personal history of diseases of the skin and subcutaneous tissue: Secondary | ICD-10-CM | POA: Diagnosis not present

## 2021-06-07 DIAGNOSIS — L409 Psoriasis, unspecified: Secondary | ICD-10-CM | POA: Diagnosis not present

## 2021-06-07 DIAGNOSIS — L232 Allergic contact dermatitis due to cosmetics: Secondary | ICD-10-CM | POA: Diagnosis not present

## 2021-06-14 DIAGNOSIS — Z5181 Encounter for therapeutic drug level monitoring: Secondary | ICD-10-CM | POA: Diagnosis not present

## 2021-06-18 DIAGNOSIS — Z5181 Encounter for therapeutic drug level monitoring: Secondary | ICD-10-CM | POA: Diagnosis not present

## 2021-06-22 DIAGNOSIS — Z5181 Encounter for therapeutic drug level monitoring: Secondary | ICD-10-CM | POA: Diagnosis not present

## 2021-07-12 DIAGNOSIS — Z7952 Long term (current) use of systemic steroids: Secondary | ICD-10-CM | POA: Diagnosis not present

## 2021-07-12 DIAGNOSIS — D692 Other nonthrombocytopenic purpura: Secondary | ICD-10-CM | POA: Diagnosis not present

## 2021-07-12 DIAGNOSIS — L2089 Other atopic dermatitis: Secondary | ICD-10-CM | POA: Diagnosis not present

## 2021-07-12 DIAGNOSIS — T380X5A Adverse effect of glucocorticoids and synthetic analogues, initial encounter: Secondary | ICD-10-CM | POA: Diagnosis not present

## 2021-07-12 DIAGNOSIS — L309 Dermatitis, unspecified: Secondary | ICD-10-CM | POA: Diagnosis not present

## 2021-07-26 DIAGNOSIS — U071 COVID-19: Secondary | ICD-10-CM | POA: Diagnosis not present

## 2021-08-26 DIAGNOSIS — K219 Gastro-esophageal reflux disease without esophagitis: Secondary | ICD-10-CM | POA: Diagnosis not present

## 2021-08-26 DIAGNOSIS — E782 Mixed hyperlipidemia: Secondary | ICD-10-CM | POA: Diagnosis not present

## 2021-08-26 DIAGNOSIS — E039 Hypothyroidism, unspecified: Secondary | ICD-10-CM | POA: Diagnosis not present

## 2021-08-26 DIAGNOSIS — I10 Essential (primary) hypertension: Secondary | ICD-10-CM | POA: Diagnosis not present

## 2021-08-26 DIAGNOSIS — N401 Enlarged prostate with lower urinary tract symptoms: Secondary | ICD-10-CM | POA: Diagnosis not present

## 2021-08-26 DIAGNOSIS — J449 Chronic obstructive pulmonary disease, unspecified: Secondary | ICD-10-CM | POA: Diagnosis not present

## 2021-09-02 DIAGNOSIS — M5416 Radiculopathy, lumbar region: Secondary | ICD-10-CM | POA: Diagnosis not present

## 2021-09-02 DIAGNOSIS — Z5181 Encounter for therapeutic drug level monitoring: Secondary | ICD-10-CM | POA: Diagnosis not present

## 2021-09-02 DIAGNOSIS — M48061 Spinal stenosis, lumbar region without neurogenic claudication: Secondary | ICD-10-CM | POA: Diagnosis not present

## 2021-09-02 DIAGNOSIS — Z79891 Long term (current) use of opiate analgesic: Secondary | ICD-10-CM | POA: Diagnosis not present

## 2021-09-02 DIAGNOSIS — M5136 Other intervertebral disc degeneration, lumbar region: Secondary | ICD-10-CM | POA: Diagnosis not present

## 2021-11-01 DIAGNOSIS — L309 Dermatitis, unspecified: Secondary | ICD-10-CM | POA: Diagnosis not present

## 2021-11-01 DIAGNOSIS — Z5181 Encounter for therapeutic drug level monitoring: Secondary | ICD-10-CM | POA: Diagnosis not present

## 2021-11-01 DIAGNOSIS — L409 Psoriasis, unspecified: Secondary | ICD-10-CM | POA: Diagnosis not present

## 2021-11-01 DIAGNOSIS — Z872 Personal history of diseases of the skin and subcutaneous tissue: Secondary | ICD-10-CM | POA: Diagnosis not present

## 2021-11-01 DIAGNOSIS — L2089 Other atopic dermatitis: Secondary | ICD-10-CM | POA: Diagnosis not present

## 2021-11-03 DIAGNOSIS — E039 Hypothyroidism, unspecified: Secondary | ICD-10-CM | POA: Diagnosis not present

## 2021-11-03 DIAGNOSIS — I10 Essential (primary) hypertension: Secondary | ICD-10-CM | POA: Diagnosis not present

## 2021-11-03 DIAGNOSIS — J449 Chronic obstructive pulmonary disease, unspecified: Secondary | ICD-10-CM | POA: Diagnosis not present

## 2021-11-03 DIAGNOSIS — N401 Enlarged prostate with lower urinary tract symptoms: Secondary | ICD-10-CM | POA: Diagnosis not present

## 2021-11-03 DIAGNOSIS — E782 Mixed hyperlipidemia: Secondary | ICD-10-CM | POA: Diagnosis not present

## 2021-11-03 DIAGNOSIS — G4730 Sleep apnea, unspecified: Secondary | ICD-10-CM | POA: Diagnosis not present

## 2021-11-03 DIAGNOSIS — J309 Allergic rhinitis, unspecified: Secondary | ICD-10-CM | POA: Diagnosis not present

## 2021-11-22 DIAGNOSIS — Z9181 History of falling: Secondary | ICD-10-CM | POA: Diagnosis not present

## 2021-11-22 DIAGNOSIS — Z5181 Encounter for therapeutic drug level monitoring: Secondary | ICD-10-CM | POA: Diagnosis not present

## 2021-11-22 DIAGNOSIS — L309 Dermatitis, unspecified: Secondary | ICD-10-CM | POA: Diagnosis not present

## 2021-11-22 DIAGNOSIS — Z7952 Long term (current) use of systemic steroids: Secondary | ICD-10-CM | POA: Diagnosis not present

## 2021-11-22 DIAGNOSIS — E785 Hyperlipidemia, unspecified: Secondary | ICD-10-CM | POA: Diagnosis not present

## 2021-11-22 DIAGNOSIS — Z13820 Encounter for screening for osteoporosis: Secondary | ICD-10-CM | POA: Diagnosis not present

## 2021-11-22 DIAGNOSIS — E039 Hypothyroidism, unspecified: Secondary | ICD-10-CM | POA: Diagnosis not present

## 2021-11-23 DIAGNOSIS — E039 Hypothyroidism, unspecified: Secondary | ICD-10-CM | POA: Diagnosis not present

## 2021-11-23 DIAGNOSIS — I10 Essential (primary) hypertension: Secondary | ICD-10-CM | POA: Diagnosis not present

## 2021-11-23 DIAGNOSIS — K219 Gastro-esophageal reflux disease without esophagitis: Secondary | ICD-10-CM | POA: Diagnosis not present

## 2021-11-23 DIAGNOSIS — N401 Enlarged prostate with lower urinary tract symptoms: Secondary | ICD-10-CM | POA: Diagnosis not present

## 2021-11-23 DIAGNOSIS — E782 Mixed hyperlipidemia: Secondary | ICD-10-CM | POA: Diagnosis not present

## 2021-12-01 DIAGNOSIS — Z5181 Encounter for therapeutic drug level monitoring: Secondary | ICD-10-CM | POA: Diagnosis not present

## 2021-12-01 DIAGNOSIS — Z13820 Encounter for screening for osteoporosis: Secondary | ICD-10-CM | POA: Diagnosis not present

## 2021-12-01 DIAGNOSIS — M5416 Radiculopathy, lumbar region: Secondary | ICD-10-CM | POA: Diagnosis not present

## 2021-12-01 DIAGNOSIS — G894 Chronic pain syndrome: Secondary | ICD-10-CM | POA: Diagnosis not present

## 2021-12-01 DIAGNOSIS — Z7952 Long term (current) use of systemic steroids: Secondary | ICD-10-CM | POA: Diagnosis not present

## 2021-12-01 DIAGNOSIS — E039 Hypothyroidism, unspecified: Secondary | ICD-10-CM | POA: Diagnosis not present

## 2021-12-01 DIAGNOSIS — M5136 Other intervertebral disc degeneration, lumbar region: Secondary | ICD-10-CM | POA: Diagnosis not present

## 2022-02-17 DIAGNOSIS — L2089 Other atopic dermatitis: Secondary | ICD-10-CM | POA: Diagnosis not present

## 2022-02-17 DIAGNOSIS — Z5181 Encounter for therapeutic drug level monitoring: Secondary | ICD-10-CM | POA: Diagnosis not present

## 2022-02-17 DIAGNOSIS — L209 Atopic dermatitis, unspecified: Secondary | ICD-10-CM | POA: Diagnosis not present

## 2022-02-17 DIAGNOSIS — Z872 Personal history of diseases of the skin and subcutaneous tissue: Secondary | ICD-10-CM | POA: Diagnosis not present

## 2022-02-17 DIAGNOSIS — L409 Psoriasis, unspecified: Secondary | ICD-10-CM | POA: Diagnosis not present

## 2022-02-17 DIAGNOSIS — X58XXXD Exposure to other specified factors, subsequent encounter: Secondary | ICD-10-CM | POA: Diagnosis not present

## 2022-02-17 DIAGNOSIS — D690 Allergic purpura: Secondary | ICD-10-CM | POA: Diagnosis not present

## 2022-02-17 DIAGNOSIS — T380X5D Adverse effect of glucocorticoids and synthetic analogues, subsequent encounter: Secondary | ICD-10-CM | POA: Diagnosis not present

## 2022-03-27 DIAGNOSIS — R531 Weakness: Secondary | ICD-10-CM | POA: Diagnosis not present

## 2022-03-27 DIAGNOSIS — E876 Hypokalemia: Secondary | ICD-10-CM | POA: Diagnosis not present

## 2022-03-27 DIAGNOSIS — I70203 Unspecified atherosclerosis of native arteries of extremities, bilateral legs: Secondary | ICD-10-CM | POA: Diagnosis not present

## 2022-03-27 DIAGNOSIS — M47896 Other spondylosis, lumbar region: Secondary | ICD-10-CM | POA: Diagnosis not present

## 2022-03-27 DIAGNOSIS — M7989 Other specified soft tissue disorders: Secondary | ICD-10-CM | POA: Diagnosis not present

## 2022-03-27 DIAGNOSIS — K3189 Other diseases of stomach and duodenum: Secondary | ICD-10-CM | POA: Diagnosis not present

## 2022-03-27 DIAGNOSIS — M79605 Pain in left leg: Secondary | ICD-10-CM | POA: Diagnosis not present

## 2022-03-27 DIAGNOSIS — M5136 Other intervertebral disc degeneration, lumbar region: Secondary | ICD-10-CM | POA: Diagnosis not present

## 2022-03-27 DIAGNOSIS — M4317 Spondylolisthesis, lumbosacral region: Secondary | ICD-10-CM | POA: Diagnosis not present

## 2022-03-27 DIAGNOSIS — M79604 Pain in right leg: Secondary | ICD-10-CM | POA: Diagnosis not present

## 2022-03-30 DIAGNOSIS — M5136 Other intervertebral disc degeneration, lumbar region: Secondary | ICD-10-CM | POA: Diagnosis not present

## 2022-03-30 DIAGNOSIS — M5416 Radiculopathy, lumbar region: Secondary | ICD-10-CM | POA: Diagnosis not present

## 2022-03-31 DIAGNOSIS — L209 Atopic dermatitis, unspecified: Secondary | ICD-10-CM | POA: Diagnosis not present

## 2022-03-31 DIAGNOSIS — Z7952 Long term (current) use of systemic steroids: Secondary | ICD-10-CM | POA: Diagnosis not present

## 2022-04-25 DIAGNOSIS — Z79899 Other long term (current) drug therapy: Secondary | ICD-10-CM | POA: Diagnosis not present

## 2022-04-25 DIAGNOSIS — L209 Atopic dermatitis, unspecified: Secondary | ICD-10-CM | POA: Diagnosis not present

## 2022-04-25 DIAGNOSIS — Z5181 Encounter for therapeutic drug level monitoring: Secondary | ICD-10-CM | POA: Diagnosis not present

## 2022-04-26 DIAGNOSIS — M5416 Radiculopathy, lumbar region: Secondary | ICD-10-CM | POA: Diagnosis not present

## 2022-05-04 DIAGNOSIS — E782 Mixed hyperlipidemia: Secondary | ICD-10-CM | POA: Diagnosis not present

## 2022-05-04 DIAGNOSIS — Z125 Encounter for screening for malignant neoplasm of prostate: Secondary | ICD-10-CM | POA: Diagnosis not present

## 2022-05-04 DIAGNOSIS — E039 Hypothyroidism, unspecified: Secondary | ICD-10-CM | POA: Diagnosis not present

## 2022-05-04 DIAGNOSIS — J449 Chronic obstructive pulmonary disease, unspecified: Secondary | ICD-10-CM | POA: Diagnosis not present

## 2022-05-04 DIAGNOSIS — I10 Essential (primary) hypertension: Secondary | ICD-10-CM | POA: Diagnosis not present

## 2022-05-04 DIAGNOSIS — N401 Enlarged prostate with lower urinary tract symptoms: Secondary | ICD-10-CM | POA: Diagnosis not present

## 2022-05-04 DIAGNOSIS — G4730 Sleep apnea, unspecified: Secondary | ICD-10-CM | POA: Diagnosis not present

## 2022-05-04 DIAGNOSIS — J309 Allergic rhinitis, unspecified: Secondary | ICD-10-CM | POA: Diagnosis not present

## 2022-05-04 DIAGNOSIS — Z0000 Encounter for general adult medical examination without abnormal findings: Secondary | ICD-10-CM | POA: Diagnosis not present

## 2022-06-02 DIAGNOSIS — L209 Atopic dermatitis, unspecified: Secondary | ICD-10-CM | POA: Diagnosis not present

## 2022-06-02 DIAGNOSIS — Z5181 Encounter for therapeutic drug level monitoring: Secondary | ICD-10-CM | POA: Diagnosis not present

## 2022-06-02 DIAGNOSIS — Z79899 Other long term (current) drug therapy: Secondary | ICD-10-CM | POA: Diagnosis not present

## 2022-07-04 DIAGNOSIS — H16223 Keratoconjunctivitis sicca, not specified as Sjogren's, bilateral: Secondary | ICD-10-CM | POA: Diagnosis not present

## 2022-07-04 DIAGNOSIS — Z0100 Encounter for examination of eyes and vision without abnormal findings: Secondary | ICD-10-CM | POA: Diagnosis not present

## 2022-08-01 DIAGNOSIS — M5136 Other intervertebral disc degeneration, lumbar region: Secondary | ICD-10-CM | POA: Diagnosis not present

## 2022-08-01 DIAGNOSIS — M5416 Radiculopathy, lumbar region: Secondary | ICD-10-CM | POA: Diagnosis not present

## 2022-08-01 DIAGNOSIS — G894 Chronic pain syndrome: Secondary | ICD-10-CM | POA: Diagnosis not present

## 2023-10-30 ENCOUNTER — Other Ambulatory Visit: Payer: Self-pay | Admitting: Neurological Surgery

## 2023-11-02 NOTE — Progress Notes (Signed)
 Surgical Instructions   Your procedure is scheduled on Monday, March 31, 25.. Report to [facility] Main Entrance "A" at 5:30 A.M., then check in with the Admitting office. Any questions or running late day of surgery: call [phone]  Questions prior to your surgery date: call [phone], Monday-Friday, 8am-4pm. If you experience any cold or flu symptoms such as cough, fever, chills, shortness of breath, etc. between now and your scheduled surgery, please notify us at the above number.     Remember:  Do not eat or drink after midnight the night before your surgery  Take these medicines the morning of surgery with A SIP OF WATER  finasteride (PROSCAR)  levothyroxine (SYNTHROID, LEVOTHROID)  omeprazole (PRILOSEC OTC)  tamsulosin [facility])   May take these medicines IF NEEDED: oxyCODONE (OXY IR/ROXICODONE)    One week prior to surgery, STOP taking any Aspirin (unless otherwise instructed by your surgeon) Aleve, Naproxen, Ibuprofen, Motrin, Advil, Goody's, BC's, all herbal medications, fish oil, and non-prescription vitamins.                     Do NOT Smoke (Tobacco/Vaping) for 24 hours prior to your procedure.  If you use a CPAP at night, you may bring your mask/headgear for your overnight stay.   You will be asked to remove any contacts, glasses, piercing's, hearing aid's, dentures/partials prior to surgery. Please bring cases for these items if needed.    Patients discharged the day of surgery will not be allowed to drive home, and someone needs to stay with them for 24 hours.  SURGICAL WAITING ROOM VISITATION Patients may have no more than 2 support people in the waiting area - these visitors may rotate.   Pre-op nurse will coordinate an appropriate time for 1 ADULT support person, who may not rotate, to accompany patient in pre-op.  Children under the age of 62 must have an adult with them who is not the patient and must remain in the main waiting area with an adult.  If  the patient needs to stay at the hospital during part of their recovery, the visitor guidelines for inpatient rooms apply.  Please refer to the [facility] website for the visitor guidelines for any additional information.   If you received a COVID test during your pre-op visit  it is requested that you wear a mask when out in public, stay away from anyone that may not be feeling well and notify your surgeon if you develop symptoms. If you have been in contact with anyone that has tested positive in the last 10 days please notify you surgeon.      Pre-operative 5 CHG Bathing Instructions   You can play a key role in reducing the risk of infection after surgery. Your skin needs to be as free of germs as possible. You can reduce the number of germs on your skin by washing with CHG (chlorhexidine gluconate) soap before surgery. CHG is an antiseptic soap that kills germs and continues to kill germs even after washing.   DO NOT use if you have an allergy to chlorhexidine/CHG or antibacterial soaps. If your skin becomes reddened or irritated, stop using the CHG and notify one of our RNs at [phone].   Please shower with the CHG soap starting 4 days before surgery using the following schedule:     Please keep in mind the following:  DO NOT shave, including legs and underarms, starting the day of your first shower.   You may  shave your face at any point before/day of surgery.  Place clean sheets on your bed the day you start using CHG soap. Use a clean washcloth (not used since being washed) for each shower. DO NOT sleep with pets once you start using the CHG.   CHG Shower Instructions:  Wash your face and private area with normal soap. If you choose to wash your hair, wash first with your normal shampoo.  After you use shampoo/soap, rinse your hair and body thoroughly to remove shampoo/soap residue.  Turn the water OFF and apply about 3 tablespoons (45 ml) of CHG soap to a CLEAN washcloth.   Apply CHG soap ONLY FROM YOUR NECK DOWN TO YOUR TOES (washing for 3-5 minutes)  DO NOT use CHG soap on face, private areas, open wounds, or sores.  Pay special attention to the area where your surgery is being performed.  If you are having back surgery, having someone wash your back for you may be helpful. Wait 2 minutes after CHG soap is applied, then you may rinse off the CHG soap.  Pat dry with a clean towel  Put on clean clothes/pajamas   If you choose to wear lotion, please use ONLY the CHG-compatible lotions that are listed below.  Additional instructions for the day of surgery: DO NOT APPLY any lotions, deodorants, cologne, or perfumes.   Do not bring valuables to the hospital. [facility] is not responsible for any belongings/valuables. Do not wear nail polish, gel polish, artificial nails, or any other type of covering on natural nails (fingers and toes) Do not wear jewelry or makeup Put on clean/comfortable clothes.  Please brush your teeth.  Ask your nurse before applying any prescription medications to the skin.     CHG Compatible Lotions   Aveeno Moisturizing lotion  Cetaphil Moisturizing Cream  Cetaphil Moisturizing Lotion  Clairol Herbal Essence Moisturizing Lotion, Dry Skin  Clairol Herbal Essence Moisturizing Lotion, Extra Dry Skin  Clairol Herbal Essence Moisturizing Lotion, Normal Skin  Curel Age Defying Therapeutic Moisturizing Lotion with Alpha Hydroxy  Curel Extreme Care Body Lotion  Curel Soothing Hands Moisturizing Hand Lotion  Curel Therapeutic Moisturizing Cream, Fragrance-Free  Curel Therapeutic Moisturizing Lotion, Fragrance-Free  Curel Therapeutic Moisturizing Lotion, Original Formula  Eucerin Daily Replenishing Lotion  Eucerin Dry Skin Therapy Plus Alpha Hydroxy Crme  Eucerin Dry Skin Therapy Plus Alpha Hydroxy Lotion  Eucerin Original Crme  Eucerin Original Lotion  Eucerin Plus Crme Eucerin Plus Lotion  Eucerin TriLipid Replenishing  Lotion  Keri Anti-Bacterial Hand Lotion  Keri Deep Conditioning Original Lotion Dry Skin Formula Softly Scented  Keri Deep Conditioning Original Lotion, Fragrance Free Sensitive Skin Formula  Keri Lotion Fast Absorbing Fragrance Free Sensitive Skin Formula  Keri Lotion Fast Absorbing Softly Scented Dry Skin Formula  Keri Original Lotion  Keri Skin Renewal Lotion Keri Silky Smooth Lotion  Keri Silky Smooth Sensitive Skin Lotion  Nivea Body Creamy Conditioning Oil  Nivea Body Extra Enriched Lotion  Nivea Body Original Lotion  Nivea Body Sheer Moisturizing Lotion Nivea Crme  Nivea Skin Firming Lotion  NutraDerm 30 Skin Lotion  NutraDerm Skin Lotion  NutraDerm Therapeutic Skin Cream  NutraDerm Therapeutic Skin Lotion  ProShield Protective Hand Cream  Provon moisturizing lotion  Please read over the following fact sheets that you were given.

## 2023-11-03 ENCOUNTER — Encounter (HOSPITAL_COMMUNITY)
Admission: RE | Admit: 2023-11-03 | Discharge: 2023-11-03 | Disposition: A | Discharge status: Home / Self Care | Source: Ambulatory Visit | Attending: Neurological Surgery | Admitting: Neurological Surgery

## 2023-11-03 ENCOUNTER — Encounter (HOSPITAL_COMMUNITY): Payer: Self-pay

## 2023-11-03 ENCOUNTER — Other Ambulatory Visit: Payer: Self-pay

## 2023-11-03 VITALS — BP 134/60 | HR 88 | Temp 98.2°F | Resp 20 | Ht 72.0 in | Wt 228.9 lb

## 2023-11-03 DIAGNOSIS — Z79891 Long term (current) use of opiate analgesic: Secondary | ICD-10-CM | POA: Diagnosis not present

## 2023-11-03 DIAGNOSIS — E785 Hyperlipidemia, unspecified: Secondary | ICD-10-CM | POA: Insufficient documentation

## 2023-11-03 DIAGNOSIS — I4510 Unspecified right bundle-branch block: Secondary | ICD-10-CM | POA: Insufficient documentation

## 2023-11-03 DIAGNOSIS — Z96611 Presence of right artificial shoulder joint: Secondary | ICD-10-CM | POA: Diagnosis not present

## 2023-11-03 DIAGNOSIS — Z79899 Other long term (current) drug therapy: Secondary | ICD-10-CM | POA: Diagnosis not present

## 2023-11-03 DIAGNOSIS — Z87891 Personal history of nicotine dependence: Secondary | ICD-10-CM | POA: Insufficient documentation

## 2023-11-03 DIAGNOSIS — G4733 Obstructive sleep apnea (adult) (pediatric): Secondary | ICD-10-CM | POA: Diagnosis not present

## 2023-11-03 DIAGNOSIS — Z9889 Other specified postprocedural states: Secondary | ICD-10-CM | POA: Diagnosis not present

## 2023-11-03 DIAGNOSIS — E039 Hypothyroidism, unspecified: Secondary | ICD-10-CM | POA: Insufficient documentation

## 2023-11-03 DIAGNOSIS — Z01812 Encounter for preprocedural laboratory examination: Secondary | ICD-10-CM | POA: Diagnosis not present

## 2023-11-03 DIAGNOSIS — Z01818 Encounter for other preprocedural examination: Secondary | ICD-10-CM | POA: Diagnosis present

## 2023-11-03 DIAGNOSIS — Z9229 Personal history of other drug therapy: Secondary | ICD-10-CM | POA: Insufficient documentation

## 2023-11-03 DIAGNOSIS — I739 Peripheral vascular disease, unspecified: Secondary | ICD-10-CM | POA: Diagnosis not present

## 2023-11-03 DIAGNOSIS — L409 Psoriasis, unspecified: Secondary | ICD-10-CM | POA: Diagnosis not present

## 2023-11-03 DIAGNOSIS — M5416 Radiculopathy, lumbar region: Secondary | ICD-10-CM | POA: Diagnosis not present

## 2023-11-03 DIAGNOSIS — I10 Essential (primary) hypertension: Secondary | ICD-10-CM | POA: Diagnosis not present

## 2023-11-03 DIAGNOSIS — K219 Gastro-esophageal reflux disease without esophagitis: Secondary | ICD-10-CM | POA: Diagnosis not present

## 2023-11-03 LAB — CBC
HCT: 37 % — ABNORMAL LOW (ref 39.0–52.0)
Hemoglobin: 12.5 g/dL — ABNORMAL LOW (ref 13.0–17.0)
MCH: 32.4 pg (ref 26.0–34.0)
MCHC: 33.8 g/dL (ref 30.0–36.0)
MCV: 95.9 fL (ref 80.0–100.0)
Platelets: 215 10*3/uL (ref 150–400)
RBC: 3.86 MIL/uL — ABNORMAL LOW (ref 4.22–5.81)
RDW: 12.5 % (ref 11.5–15.5)
WBC: 10.1 10*3/uL (ref 4.0–10.5)
nRBC: 0 % (ref 0.0–0.2)

## 2023-11-03 LAB — BASIC METABOLIC PANEL
Anion gap: 14 (ref 5–15)
BUN: 9 mg/dL (ref 8–23)
CO2: 25 mmol/L (ref 22–32)
Calcium: 9.3 mg/dL (ref 8.9–10.3)
Chloride: 95 mmol/L — ABNORMAL LOW (ref 98–111)
Creatinine, Ser: 0.77 mg/dL (ref 0.61–1.24)
GFR, Estimated: >60 mL/min (ref >60)
Glucose, Bld: 116 mg/dL — ABNORMAL HIGH (ref 70–99)
Potassium: 3.7 mmol/L (ref 3.5–5.1)
Sodium: 134 mmol/L — ABNORMAL LOW (ref 135–145)

## 2023-11-03 LAB — PROTIME-INR
INR: 1 (ref 0.8–1.2)
Prothrombin Time: 13.5 s (ref 11.4–15.2)

## 2023-11-03 LAB — TYPE AND SCREEN
ABO/RH(D): O POS
Antibody Screen: NEGATIVE

## 2023-11-03 LAB — SURGICAL PCR SCREEN
MRSA, PCR: NEGATIVE
Staphylococcus aureus: NEGATIVE

## 2023-11-03 NOTE — Progress Notes (Signed)
 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 Patient agrees to go over the instructions while at home for a better understanding. Patient also instructed to self quarantine after being tested for COVID-19. The opportunity to ask questions was provided.

## 2023-11-06 NOTE — Progress Notes (Signed)
 Anesthesia Chart Review:  Case: [id number] Date/Time: 11/10/23 1103   Procedure: POSTERIOR LUMBAR FUSION 1 LEVEL (Back) - PLIF - Posterior Lateral and Interbody fusion - L3-L4   Anesthesia type: General   Diagnosis: Radiculopathy, lumbar region [M54.16]   Pre-op diagnosis: Radiculopathy lumbar region   [facility] / [facility]   Surgeons: Arman Bogus, MD       DISCUSSION:  VS: BP 134/60   Pulse 88   Temp 36.8 C (Oral)   Resp 20   Ht 6' (1.829 m)   Wt 103.8 kg   SpO2 98%   BMI 31.04 kg/m   PROVIDERS: Joycelyn Rua, MD   LABS: {CHL AN LABS REVIEWED:112001::"Labs reviewed: Acceptable for surgery."} (all labs ordered are listed, but only abnormal results are displayed)  Labs Reviewed  BASIC METABOLIC PANEL - Abnormal; Notable for the following components:      Result Value   Sodium 134 (*)    Chloride 95 (*)    Glucose, Bld 116 (*)    All other components within normal limits  CBC - Abnormal; Notable for the following components:   RBC 3.86 (*)    Hemoglobin 12.5 (*)    HCT 37.0 (*)    All other components within normal limits  SURGICAL PCR SCREEN  PROTIME-INR  TYPE AND SCREEN     IMAGES:   EKG:   CV:  Past Medical History:  Diagnosis Date   Abnormal EKG    Arthritis    Essential hypertension 01/02/2017   GERD (gastroesophageal reflux disease)    Gout    Hyperlipidemia 01/02/2017   Hypertension    Hypothyroidism    Peripheral vascular disease (HCC)    RBBB 01/02/2017   Rotator cuff tear    Sleep apnea     Past Surgical History:  Procedure Laterality Date   APPENDECTOMY     CATARACT EXTRACTION Bilateral    EYE SURGERY     8 years ago   FEMORAL ENDARTERECTOMY Bilateral    done 15 yrs. ago at [facility]   JOINT REPLACEMENT     LUMBAR LAMINECTOMY/DECOMPRESSION MICRODISCECTOMY Left 04/23/2020   Procedure: MICRO LUMBER DECOMPRESSION LUMBAR FOUR-SACRAL ONE ON LEFT AND FORAMINOTOMY LUMBAR FIVE LEFT;  Surgeon: Jene Every, MD;  Location: [facility]  OR;  Service: Orthopedics;  Laterality: Left;  posterior   REVERSE SHOULDER ARTHROPLASTY Right 12/16/2016   Procedure: RIGHT REVERSE TOTAL SHOULDER ARTHROPLASTY;  Surgeon: Beverely Low, MD;  Location: [facility] OR;  Service: Orthopedics;  Laterality: Right;   SEPTOPLASTY     TONSILLECTOMY      MEDICATIONS:  amLODipine (NORVASC) 10 MG tablet   APPLE CIDER VINEGAR PO   Ascorbic Acid (VITAMIN C) 1000 MG tablet   Cholecalciferol (VITAMIN D-3) 125 MCG (5000 UT) TABS   docusate sodium (COLACE) 100 MG capsule   finasteride (PROSCAR) 5 MG tablet   fluticasone (FLONASE) 50 MCG/ACT nasal spray   folic acid (FOLVITE) 1 MG tablet   hydrochlorothiazide (HYDRODIURIL) 25 MG tablet   levocetirizine (XYZAL) 5 MG tablet   levothyroxine (SYNTHROID, LEVOTHROID) 112 MCG tablet   methotrexate (RHEUMATREX) 2.5 MG tablet   modafinil (PROVIGIL) 200 MG tablet   Multiple Vitamin (MULTIVITAMIN WITH MINERALS) TABS tablet   omeprazole (PRILOSEC OTC) 20 MG tablet   oxyCODONE (OXY IR/ROXICODONE) 5 MG immediate release tablet   oxyCODONE (OXYCONTIN) 40 mg 12 hr tablet   predniSONE (DELTASONE) 5 MG tablet   Probiotic Product (PROBIOTIC COLON SUPPORT PO)   rosuvastatin (CRESTOR) 5 MG tablet  tacrolimus (PROTOPIC) 0.1 % ointment   tamsulosin (FLOMAX) 0.4 MG CAPS capsule   triamcinolone ointment (KENALOG) 0.1 %   valsartan (DIOVAN) 80 MG tablet   vitamin B-12 (CYANOCOBALAMIN) 1000 MCG tablet   Zinc 50 MG CAPS   No current facility-administered medications for this encounter.

## 2023-11-07 NOTE — Anesthesia Preprocedure Evaluation (Addendum)
 Anesthesia Evaluation  Patient identified by MRN, date of birth, ID band Patient awake    Reviewed: Allergy & Precautions, NPO status , Patient's Chart, lab work & pertinent test results  Airway Mallampati: II  TM Distance: >3 FB Neck ROM: Full    Dental no notable dental hx.    Pulmonary sleep apnea , Patient abstained from smoking., former smoker H/o trach ~ 45 years ago per patient   Pulmonary exam normal        Cardiovascular hypertension, Pt. on medications + Peripheral Vascular Disease  Normal cardiovascular exam+ dysrhythmias      Neuro/Psych negative neurological ROS  negative psych ROS   GI/Hepatic Neg liver ROS,GERD  Medicated and Controlled,,  Endo/Other  Hypothyroidism    Renal/GU negative Renal ROS     Musculoskeletal  (+) Arthritis ,    Abdominal  (+) + obese  Peds  Hematology  (+) Blood dyscrasia, anemia   Anesthesia Other Findings Radiculopathy lumbar region  Reproductive/Obstetrics                              Anesthesia Physical Anesthesia Plan  ASA: 3  Anesthesia Plan: General   Post-op Pain Management:    Induction: Intravenous  PONV Risk Score and Plan: 2 and Ondansetron, Dexamethasone and Treatment may vary due to age or medical condition  Airway Management Planned: Oral ETT  Additional Equipment:   Intra-op Plan:   Post-operative Plan: Extubation in OR  Informed Consent:      Dental advisory given  Plan Discussed with: CRNA  Anesthesia Plan Comments: (PAT note written 11/07/2023 by Shonna Chock, PA-C. He is on prednisone 15 mg daily.   )        Anesthesia Quick Evaluation

## 2023-11-13 ENCOUNTER — Ambulatory Visit (HOSPITAL_COMMUNITY): Payer: Self-pay | Admitting: Vascular Surgery

## 2023-11-13 ENCOUNTER — Observation Stay (HOSPITAL_COMMUNITY)
Admission: RE | Admit: 2023-11-13 | Discharge: 2023-11-14 | Disposition: A | Discharge status: Home / Self Care | Attending: Neurological Surgery | Admitting: Neurological Surgery

## 2023-11-13 ENCOUNTER — Encounter (HOSPITAL_COMMUNITY): Payer: Self-pay | Admitting: Neurological Surgery

## 2023-11-13 ENCOUNTER — Other Ambulatory Visit: Payer: Self-pay

## 2023-11-13 ENCOUNTER — Ambulatory Visit (HOSPITAL_COMMUNITY)

## 2023-11-13 ENCOUNTER — Encounter (HOSPITAL_COMMUNITY)
Admission: RE | Disposition: A | Discharge status: Home / Self Care | Payer: Self-pay | Source: Home / Self Care | Attending: Neurological Surgery

## 2023-11-13 ENCOUNTER — Ambulatory Visit (HOSPITAL_BASED_OUTPATIENT_CLINIC_OR_DEPARTMENT_OTHER): Payer: Self-pay

## 2023-11-13 DIAGNOSIS — Z79899 Other long term (current) drug therapy: Secondary | ICD-10-CM | POA: Diagnosis not present

## 2023-11-13 DIAGNOSIS — T84028A Dislocation of other internal joint prosthesis, initial encounter: Secondary | ICD-10-CM | POA: Insufficient documentation

## 2023-11-13 DIAGNOSIS — Z9689 Presence of other specified functional implants: Secondary | ICD-10-CM | POA: Insufficient documentation

## 2023-11-13 DIAGNOSIS — I10 Essential (primary) hypertension: Secondary | ICD-10-CM | POA: Diagnosis not present

## 2023-11-13 DIAGNOSIS — M5416 Radiculopathy, lumbar region: Secondary | ICD-10-CM

## 2023-11-13 DIAGNOSIS — E039 Hypothyroidism, unspecified: Secondary | ICD-10-CM

## 2023-11-13 DIAGNOSIS — M48061 Spinal stenosis, lumbar region without neurogenic claudication: Secondary | ICD-10-CM | POA: Diagnosis not present

## 2023-11-13 DIAGNOSIS — Z981 Arthrodesis status: Principal | ICD-10-CM

## 2023-11-13 DIAGNOSIS — Z96611 Presence of right artificial shoulder joint: Secondary | ICD-10-CM | POA: Diagnosis not present

## 2023-11-13 DIAGNOSIS — M7138 Other bursal cyst, other site: Secondary | ICD-10-CM | POA: Diagnosis not present

## 2023-11-13 DIAGNOSIS — G4733 Obstructive sleep apnea (adult) (pediatric): Secondary | ICD-10-CM | POA: Diagnosis not present

## 2023-11-13 DIAGNOSIS — Z87891 Personal history of nicotine dependence: Secondary | ICD-10-CM | POA: Diagnosis not present

## 2023-11-13 DIAGNOSIS — M960 Pseudarthrosis after fusion or arthrodesis: Secondary | ICD-10-CM | POA: Diagnosis not present

## 2023-11-13 LAB — ABO/RH: ABO/RH(D): O POS

## 2023-11-13 SURGERY — POSTERIOR LUMBAR FUSION 1 LEVEL
Anesthesia: General | Site: Back

## 2023-11-13 MED ORDER — SODIUM CHLORIDE 0.9 % IV SOLN
250.0000 mL | INTRAVENOUS | Status: DC
  Administered 2023-11-13: 250 mL via INTRAVENOUS

## 2023-11-13 MED ORDER — ACETAMINOPHEN 500 MG PO TABS
1000.0000 mg | ORAL_TABLET | Freq: Four times a day (QID) | ORAL | Status: AC
  Administered 2023-11-13 – 2023-11-14 (×4): 1000 mg via ORAL
  Filled 2023-11-13 (×4): qty 2

## 2023-11-13 MED ORDER — FENTANYL CITRATE (PF) 100 MCG/2ML IJ SOLN
INTRAMUSCULAR | Status: AC
  Filled 2023-11-13: qty 2

## 2023-11-13 MED ORDER — DOCUSATE SODIUM 100 MG PO CAPS
100.0000 mg | ORAL_CAPSULE | Freq: Two times a day (BID) | ORAL | Status: DC | PRN
  Administered 2023-11-14: 100 mg via ORAL
  Filled 2023-11-13: qty 1

## 2023-11-13 MED ORDER — ONDANSETRON HCL 4 MG/2ML IJ SOLN: 4.0000 mg | Freq: Once | INTRAMUSCULAR | Status: DC | PRN

## 2023-11-13 MED ORDER — CHLORHEXIDINE GLUCONATE 0.12 % MT SOLN
15.0000 mL | Freq: Once | OROMUCOSAL | Status: AC
  Administered 2023-11-13: 15 mL via OROMUCOSAL
  Filled 2023-11-13: qty 15

## 2023-11-13 MED ORDER — VANCOMYCIN HCL 1000 MG IV SOLR
INTRAVENOUS | Status: DC | PRN
  Administered 2023-11-13: 1000 mg

## 2023-11-13 MED ORDER — OXYCODONE HCL 5 MG PO TABS
5.0000 mg | ORAL_TABLET | ORAL | Status: DC | PRN
  Administered 2023-11-13 (×2): 5 mg via ORAL
  Filled 2023-11-13 (×2): qty 1

## 2023-11-13 MED ORDER — PANTOPRAZOLE SODIUM 20 MG PO TBEC: 20.0000 mg | DELAYED_RELEASE_TABLET | Freq: Every day | ORAL | Status: DC

## 2023-11-13 MED ORDER — TAMSULOSIN HCL 0.4 MG PO CAPS: 0.8000 mg | ORAL_CAPSULE | Freq: Every day | ORAL | Status: DC

## 2023-11-13 MED ORDER — POTASSIUM CHLORIDE IN NACL 20-0.9 MEQ/L-% IV SOLN
INTRAVENOUS | Status: AC
  Filled 2023-11-13: qty 1000

## 2023-11-13 MED ORDER — SODIUM CHLORIDE 0.9% FLUSH
3.0000 mL | Freq: Two times a day (BID) | INTRAVENOUS | Status: DC
  Administered 2023-11-13 – 2023-11-14 (×2): 3 mL via INTRAVENOUS

## 2023-11-13 MED ORDER — VANCOMYCIN HCL 1000 MG IV SOLR
INTRAVENOUS | Status: AC
  Filled 2023-11-13: qty 20

## 2023-11-13 MED ORDER — CHLORHEXIDINE GLUCONATE CLOTH 2 % EX PADS: 6.0000 | MEDICATED_PAD | Freq: Once | CUTANEOUS | Status: DC

## 2023-11-13 MED ORDER — HYDROMORPHONE HCL 1 MG/ML IJ SOLN
INTRAMUSCULAR | Status: AC
  Filled 2023-11-13: qty 1

## 2023-11-13 MED ORDER — LIDOCAINE 2% (20 MG/ML) 5 ML SYRINGE
INTRAMUSCULAR | Status: DC | PRN
  Administered 2023-11-13: 60 mg via INTRAVENOUS

## 2023-11-13 MED ORDER — MORPHINE SULFATE (PF) 2 MG/ML IV SOLN: 2.0000 mg | INTRAVENOUS | Status: DC | PRN

## 2023-11-13 MED ORDER — MODAFINIL 100 MG PO TABS
200.0000 mg | ORAL_TABLET | Freq: Every day | ORAL | Status: DC
Start: 2023-11-14 — End: 2023-11-14

## 2023-11-13 MED ORDER — DEXAMETHASONE SODIUM PHOSPHATE 10 MG/ML IJ SOLN
INTRAMUSCULAR | Status: AC
  Filled 2023-11-13: qty 1

## 2023-11-13 MED ORDER — ACETAMINOPHEN 500 MG PO TABS
1000.0000 mg | ORAL_TABLET | ORAL | Status: AC
  Administered 2023-11-13: 1000 mg via ORAL
  Filled 2023-11-13: qty 2

## 2023-11-13 MED ORDER — DEXAMETHASONE 4 MG PO TABS
4.0000 mg | ORAL_TABLET | Freq: Four times a day (QID) | ORAL | Status: DC
  Administered 2023-11-13 – 2023-11-14 (×4): 4 mg via ORAL
  Filled 2023-11-13 (×4): qty 1

## 2023-11-13 MED ORDER — TACROLIMUS 0.1 % EX OINT: 1.0000 | TOPICAL_OINTMENT | CUTANEOUS | Status: DC | PRN

## 2023-11-13 MED ORDER — LACTATED RINGERS IV SOLN: INTRAVENOUS | Status: DC | PRN

## 2023-11-13 MED ORDER — PHENOL 1.4 % MT LIQD: 1.0000 | OROMUCOSAL | Status: DC | PRN

## 2023-11-13 MED ORDER — LEVOTHYROXINE SODIUM 112 MCG PO TABS
112.0000 ug | ORAL_TABLET | Freq: Every day | ORAL | Status: DC
  Administered 2023-11-14: 112 ug via ORAL
  Filled 2023-11-13: qty 1

## 2023-11-13 MED ORDER — DEXAMETHASONE SODIUM PHOSPHATE 10 MG/ML IJ SOLN
INTRAMUSCULAR | Status: DC | PRN
  Administered 2023-11-13: 10 mg via INTRAVENOUS

## 2023-11-13 MED ORDER — FENTANYL CITRATE (PF) 250 MCG/5ML IJ SOLN
INTRAMUSCULAR | Status: AC
  Filled 2023-11-13: qty 5

## 2023-11-13 MED ORDER — ONDANSETRON HCL 4 MG PO TABS: 4.0000 mg | ORAL_TABLET | Freq: Four times a day (QID) | ORAL | Status: DC | PRN

## 2023-11-13 MED ORDER — PHENYLEPHRINE 80 MCG/ML (10ML) SYRINGE FOR IV PUSH (FOR BLOOD PRESSURE SUPPORT)
PREFILLED_SYRINGE | INTRAVENOUS | Status: AC
  Filled 2023-11-13: qty 10

## 2023-11-13 MED ORDER — FINASTERIDE 5 MG PO TABS: 5.0000 mg | ORAL_TABLET | Freq: Every day | ORAL | Status: DC

## 2023-11-13 MED ORDER — CEFAZOLIN SODIUM-DEXTROSE 2-4 GM/100ML-% IV SOLN
2.0000 g | INTRAVENOUS | Status: AC
  Administered 2023-11-13: 2 g via INTRAVENOUS
  Filled 2023-11-13: qty 100

## 2023-11-13 MED ORDER — ROCURONIUM BROMIDE 10 MG/ML (PF) SYRINGE
PREFILLED_SYRINGE | INTRAVENOUS | Status: AC
  Filled 2023-11-13: qty 10

## 2023-11-13 MED ORDER — THROMBIN 20000 UNITS EX SOLR
CUTANEOUS | Status: AC
  Filled 2023-11-13: qty 20000

## 2023-11-13 MED ORDER — ONDANSETRON HCL 4 MG/2ML IJ SOLN: 4.0000 mg | Freq: Four times a day (QID) | INTRAMUSCULAR | Status: DC | PRN

## 2023-11-13 MED ORDER — EPHEDRINE SULFATE-NACL 50-0.9 MG/10ML-% IV SOSY
PREFILLED_SYRINGE | INTRAVENOUS | Status: DC | PRN
  Administered 2023-11-13: 5 mg via INTRAVENOUS

## 2023-11-13 MED ORDER — THROMBIN 20000 UNITS EX SOLR
CUTANEOUS | Status: DC | PRN
  Administered 2023-11-13: 20 mL via TOPICAL

## 2023-11-13 MED ORDER — AMLODIPINE BESYLATE 10 MG PO TABS: 10.0000 mg | ORAL_TABLET | Freq: Every day | ORAL | Status: DC

## 2023-11-13 MED ORDER — BUPIVACAINE HCL (PF) 0.25 % IJ SOLN
INTRAMUSCULAR | Status: AC
Start: 2023-11-13 — End: ?
  Filled 2023-11-13: qty 30

## 2023-11-13 MED ORDER — DEXAMETHASONE SODIUM PHOSPHATE 4 MG/ML IJ SOLN: 4.0000 mg | Freq: Four times a day (QID) | INTRAMUSCULAR | Status: DC

## 2023-11-13 MED ORDER — PROPOFOL 10 MG/ML IV BOLUS
INTRAVENOUS | Status: AC
  Filled 2023-11-13: qty 20

## 2023-11-13 MED ORDER — SODIUM CHLORIDE 0.9% FLUSH: 3.0000 mL | INTRAVENOUS | Status: DC | PRN

## 2023-11-13 MED ORDER — GABAPENTIN 300 MG PO CAPS
300.0000 mg | ORAL_CAPSULE | ORAL | Status: AC
  Administered 2023-11-13: 300 mg via ORAL
  Filled 2023-11-13: qty 1

## 2023-11-13 MED ORDER — FENTANYL CITRATE (PF) 250 MCG/5ML IJ SOLN
INTRAMUSCULAR | Status: DC | PRN
Start: 2023-11-13 — End: 2023-11-13
  Administered 2023-11-13 (×3): 50 ug via INTRAVENOUS
  Administered 2023-11-13: 100 ug via INTRAVENOUS

## 2023-11-13 MED ORDER — ALBUMIN HUMAN 5 % IV SOLN
INTRAVENOUS | Status: DC | PRN
Start: 2023-11-13 — End: 2023-11-13

## 2023-11-13 MED ORDER — BUPIVACAINE HCL (PF) 0.25 % IJ SOLN
INTRAMUSCULAR | Status: DC | PRN
Start: 2023-11-13 — End: 2023-11-13
  Administered 2023-11-13: 7 mL

## 2023-11-13 MED ORDER — LORATADINE 10 MG PO TABS
5.0000 mg | ORAL_TABLET | Freq: Every day | ORAL | Status: DC
  Administered 2023-11-14: 5 mg via ORAL
  Filled 2023-11-13: qty 1

## 2023-11-13 MED ORDER — CELECOXIB 200 MG PO CAPS
200.0000 mg | ORAL_CAPSULE | Freq: Two times a day (BID) | ORAL | Status: DC
  Administered 2023-11-13 – 2023-11-14 (×2): 200 mg via ORAL
  Filled 2023-11-13 (×2): qty 1

## 2023-11-13 MED ORDER — FENTANYL CITRATE (PF) 100 MCG/2ML IJ SOLN
25.0000 ug | INTRAMUSCULAR | Status: DC | PRN
  Administered 2023-11-13 (×3): 50 ug via INTRAVENOUS

## 2023-11-13 MED ORDER — IRBESARTAN 75 MG PO TABS: 37.5000 mg | ORAL_TABLET | Freq: Every day | ORAL | Status: DC

## 2023-11-13 MED ORDER — METHOCARBAMOL 1000 MG/10ML IJ SOLN: 500.0000 mg | Freq: Four times a day (QID) | INTRAMUSCULAR | Status: DC | PRN

## 2023-11-13 MED ORDER — ONDANSETRON HCL 4 MG/2ML IJ SOLN
INTRAMUSCULAR | Status: AC
  Filled 2023-11-13: qty 2

## 2023-11-13 MED ORDER — MIDAZOLAM HCL 2 MG/2ML IJ SOLN
INTRAMUSCULAR | Status: AC
  Filled 2023-11-13: qty 2

## 2023-11-13 MED ORDER — PREDNISONE 5 MG PO TABS: 15.0000 mg | ORAL_TABLET | Freq: Every day | ORAL | Status: DC

## 2023-11-13 MED ORDER — PHENYLEPHRINE 80 MCG/ML (10ML) SYRINGE FOR IV PUSH (FOR BLOOD PRESSURE SUPPORT)
PREFILLED_SYRINGE | INTRAVENOUS | Status: DC | PRN
  Administered 2023-11-13: 160 ug via INTRAVENOUS

## 2023-11-13 MED ORDER — ROCURONIUM BROMIDE 10 MG/ML (PF) SYRINGE
PREFILLED_SYRINGE | INTRAVENOUS | Status: DC | PRN
  Administered 2023-11-13: 50 mg via INTRAVENOUS
  Administered 2023-11-13: 10 mg via INTRAVENOUS
  Administered 2023-11-13: 20 mg via INTRAVENOUS

## 2023-11-13 MED ORDER — METHOCARBAMOL 500 MG PO TABS
500.0000 mg | ORAL_TABLET | Freq: Four times a day (QID) | ORAL | Status: DC | PRN
  Administered 2023-11-13: 500 mg via ORAL
  Filled 2023-11-13: qty 1

## 2023-11-13 MED ORDER — CEFAZOLIN SODIUM-DEXTROSE 2-4 GM/100ML-% IV SOLN
2.0000 g | Freq: Three times a day (TID) | INTRAVENOUS | Status: AC
  Administered 2023-11-13 – 2023-11-14 (×2): 2 g via INTRAVENOUS
  Filled 2023-11-13 (×2): qty 100

## 2023-11-13 MED ORDER — ADULT MULTIVITAMIN W/MINERALS CH
1.0000 | ORAL_TABLET | Freq: Every day | ORAL | Status: DC
Start: 2023-11-14 — End: 2023-11-14

## 2023-11-13 MED ORDER — PROPOFOL 10 MG/ML IV BOLUS
INTRAVENOUS | Status: DC | PRN
  Administered 2023-11-13: 30 mg via INTRAVENOUS
  Administered 2023-11-13: 130 mg via INTRAVENOUS

## 2023-11-13 MED ORDER — TRIAMCINOLONE ACETONIDE 0.1 % EX OINT
1.0000 | TOPICAL_OINTMENT | Freq: Three times a day (TID) | CUTANEOUS | Status: DC
  Administered 2023-11-13: 1 via TOPICAL
  Filled 2023-11-13: qty 15

## 2023-11-13 MED ORDER — LIDOCAINE 2% (20 MG/ML) 5 ML SYRINGE
INTRAMUSCULAR | Status: AC
  Filled 2023-11-13: qty 5

## 2023-11-13 MED ORDER — PHENYLEPHRINE HCL-NACL 20-0.9 MG/250ML-% IV SOLN
INTRAVENOUS | Status: DC | PRN
  Administered 2023-11-13: 20 ug/min via INTRAVENOUS

## 2023-11-13 MED ORDER — OXYCODONE HCL ER 10 MG PO T12A
40.0000 mg | EXTENDED_RELEASE_TABLET | Freq: Two times a day (BID) | ORAL | Status: DC
  Administered 2023-11-13 – 2023-11-14 (×2): 40 mg via ORAL
  Filled 2023-11-13 (×3): qty 4

## 2023-11-13 MED ORDER — ORAL CARE MOUTH RINSE: 15.0000 mL | Freq: Once | OROMUCOSAL | Status: AC

## 2023-11-13 MED ORDER — THROMBIN 5000 UNITS EX SOLR
OROMUCOSAL | Status: DC | PRN
  Administered 2023-11-13: 5 mL via TOPICAL

## 2023-11-13 MED ORDER — THROMBIN 5000 UNITS EX KIT
PACK | CUTANEOUS | Status: AC
  Filled 2023-11-13: qty 1

## 2023-11-13 MED ORDER — HYDROMORPHONE HCL 1 MG/ML IJ SOLN
0.2500 mg | INTRAMUSCULAR | Status: DC | PRN
  Administered 2023-11-13 (×2): 0.5 mg via INTRAVENOUS

## 2023-11-13 MED ORDER — FOLIC ACID 1 MG PO TABS
1.0000 mg | ORAL_TABLET | Freq: Every day | ORAL | Status: DC
Start: 2023-11-13 — End: 2023-11-13

## 2023-11-13 MED ORDER — MENTHOL 3 MG MT LOZG: 1.0000 | LOZENGE | OROMUCOSAL | Status: DC | PRN

## 2023-11-13 MED ORDER — HYDROCHLOROTHIAZIDE 25 MG PO TABS: 25.0000 mg | ORAL_TABLET | Freq: Every day | ORAL | Status: DC

## 2023-11-13 MED ORDER — VASHE WOUND IRRIGATION OPTIME
TOPICAL | Status: DC | PRN
  Administered 2023-11-13: 34 [oz_av] via TOPICAL

## 2023-11-13 MED ORDER — SENNA 8.6 MG PO TABS
1.0000 | ORAL_TABLET | Freq: Two times a day (BID) | ORAL | Status: DC
  Administered 2023-11-14: 8.6 mg via ORAL
  Filled 2023-11-13: qty 1

## 2023-11-13 MED ORDER — ZINC 50 MG PO CAPS
50.0000 mg | ORAL_CAPSULE | Freq: Every day | ORAL | Status: DC
Start: 2023-11-13 — End: 2023-11-13

## 2023-11-13 MED ORDER — 0.9 % SODIUM CHLORIDE (POUR BTL) OPTIME
TOPICAL | Status: DC | PRN
Start: 2023-11-13 — End: 2023-11-13
  Administered 2023-11-13: 1000 mL

## 2023-11-13 MED ORDER — SUGAMMADEX SODIUM 200 MG/2ML IV SOLN
INTRAVENOUS | Status: DC | PRN
  Administered 2023-11-13: 200 mg via INTRAVENOUS

## 2023-11-13 MED ORDER — AMISULPRIDE (ANTIEMETIC) 5 MG/2ML IV SOLN: 10.0000 mg | Freq: Once | INTRAVENOUS | Status: DC | PRN

## 2023-11-13 MED ORDER — PANTOPRAZOLE SODIUM 20 MG PO TBEC
20.0000 mg | DELAYED_RELEASE_TABLET | Freq: Every day | ORAL | Status: DC
  Administered 2023-11-14: 20 mg via ORAL
  Filled 2023-11-13: qty 1

## 2023-11-13 MED ORDER — ONDANSETRON HCL 4 MG/2ML IJ SOLN
INTRAMUSCULAR | Status: DC | PRN
  Administered 2023-11-13: 4 mg via INTRAVENOUS

## 2023-11-13 SURGICAL SUPPLY — 57 items
ALLOGRAFT BONE FIBER KORE 5 (Bone Implant) IMPLANT
BAG COUNTER SPONGE SURGICOUNT (BAG) ×1 IMPLANT
BASKET BONE COLLECTION (BASKET) ×1 IMPLANT
BENZOIN TINCTURE PRP APPL 2/3 (GAUZE/BANDAGES/DRESSINGS) ×1 IMPLANT
BLADE BONE MILL MEDIUM (MISCELLANEOUS) ×1 IMPLANT
BLADE CLIPPER SURG (BLADE) IMPLANT
BUR CARBIDE MATCH 3.0 (BURR) ×1 IMPLANT
CANISTER SUCT 3000ML PPV (MISCELLANEOUS) ×1 IMPLANT
CLEANSER WND VASHE 34 (WOUND CARE) ×1 IMPLANT
CNTNR URN SCR LID CUP LEK RST (MISCELLANEOUS) ×1 IMPLANT
COVER BACK TABLE 60X90IN (DRAPES) ×1 IMPLANT
DERMABOND ADVANCED .7 DNX12 (GAUZE/BANDAGES/DRESSINGS) ×1 IMPLANT
DRAPE C-ARM 42X72 X-RAY (DRAPES) ×2 IMPLANT
DRAPE C-ARMOR (DRAPES) ×1 IMPLANT
DRAPE LAPAROTOMY 100X72X124 (DRAPES) ×1 IMPLANT
DRAPE SURG 17X23 STRL (DRAPES) ×1 IMPLANT
DRSG OPSITE POSTOP 4X6 (GAUZE/BANDAGES/DRESSINGS) IMPLANT
DURAPREP 26ML APPLICATOR (WOUND CARE) ×1 IMPLANT
ELECT REM PT RETURN 9FT ADLT (ELECTROSURGICAL) ×1 IMPLANT
ELECTRODE REM PT RTRN 9FT ADLT (ELECTROSURGICAL) ×1 IMPLANT
EVACUATOR 1/8 PVC DRAIN (DRAIN) ×1 IMPLANT
GAUZE 4X4 16PLY ~~LOC~~+RFID DBL (SPONGE) IMPLANT
GLOVE BIO SURGEON STRL SZ7 (GLOVE) IMPLANT
GLOVE BIO SURGEON STRL SZ8 (GLOVE) ×2 IMPLANT
GLOVE BIOGEL PI IND STRL 7.0 (GLOVE) IMPLANT
GLOVE BIOGEL PI IND STRL 7.5 (GLOVE) IMPLANT
GLOVE BIOGEL PI IND STRL 8.5 (GLOVE) IMPLANT
GLOVE ECLIPSE 8.5 STRL (GLOVE) IMPLANT
GLOVE SURG SS PI 6.5 STRL IVOR (GLOVE) IMPLANT
GOWN STRL REUS W/ TWL LRG LVL3 (GOWN DISPOSABLE) IMPLANT
GOWN STRL REUS W/ TWL XL LVL3 (GOWN DISPOSABLE) ×2 IMPLANT
GOWN STRL REUS W/TWL 2XL LVL3 (GOWN DISPOSABLE) IMPLANT
HEMOSTAT POWDER KIT SURGIFOAM (HEMOSTASIS) ×1 IMPLANT
KIT BASIN OR (CUSTOM PROCEDURE TRAY) ×1 IMPLANT
KIT TURNOVER KIT B (KITS) ×1 IMPLANT
MILL BONE PREP (MISCELLANEOUS) ×1 IMPLANT
NDL HYPO 25X1 1.5 SAFETY (NEEDLE) ×1 IMPLANT
NEEDLE HYPO 25X1 1.5 SAFETY (NEEDLE) ×1 IMPLANT
NS IRRIG 1000ML POUR BTL (IV SOLUTION) ×1 IMPLANT
PACK LAMINECTOMY NEURO (CUSTOM PROCEDURE TRAY) ×1 IMPLANT
PAD ARMBOARD POSITIONER FOAM (MISCELLANEOUS) ×3 IMPLANT
ROD LORD LIPPED TI 5.5X35 (Rod) IMPLANT
SCREW CANC SHANK MOD 5.5X45 (Screw) IMPLANT
SCREW CANC SHANK MOD 6.5X45 (Screw) IMPLANT
SCREW KODIAK 6.5X45 (Screw) IMPLANT
SCREW POLYAXIAL TULIP (Screw) IMPLANT
SET SCREW SPNE (Screw) IMPLANT
SPACER IDENTITI PS 5D 8X9X25 (Spacer) IMPLANT
SPONGE SURGIFOAM ABS GEL 100 (HEMOSTASIS) ×1 IMPLANT
SPONGE T-LAP 4X18 ~~LOC~~+RFID (SPONGE) IMPLANT
STRIP CLOSURE SKIN 1/2X4 (GAUZE/BANDAGES/DRESSINGS) ×1 IMPLANT
SUT VIC AB 0 CT1 18XCR BRD8 (SUTURE) ×1 IMPLANT
SUT VIC AB 2-0 CP2 18 (SUTURE) ×1 IMPLANT
SUT VIC AB 3-0 SH 8-18 (SUTURE) ×2 IMPLANT
TOWEL GREEN STERILE (TOWEL DISPOSABLE) ×1 IMPLANT
TOWEL GREEN STERILE FF (TOWEL DISPOSABLE) ×1 IMPLANT
WATER STERILE IRR 1000ML POUR (IV SOLUTION) ×1 IMPLANT

## 2023-11-13 NOTE — H&P (Signed)
 Subjective: Patient is a 73 y.o. male admitted for back and leg pain. Onset of symptoms was several months ago, gradually worsening since that time.  The pain is rated severe, and is located at the across the lower back and radiates to RLE. The pain is described as aching and occurs all day. The symptoms have been progressive. Symptoms are exacerbated by exercise and standing. MRI or CT showed large HNP L3-4. Previous surgery.   Past Medical History:  Diagnosis Date   Abnormal EKG    Arthritis    Essential hypertension 01/02/2017   GERD (gastroesophageal reflux disease)    Gout    Hyperlipidemia 01/02/2017   Hypertension    Hypothyroidism    Peripheral vascular disease (HCC)    RBBB 01/02/2017   Rotator cuff tear    Sleep apnea     Past Surgical History:  Procedure Laterality Date   APPENDECTOMY     CATARACT EXTRACTION Bilateral    EYE SURGERY     8 years ago   FEMORAL ENDARTERECTOMY Bilateral    done 15 yrs. ago at [facility]   JOINT REPLACEMENT     LUMBAR LAMINECTOMY/DECOMPRESSION MICRODISCECTOMY Left 04/23/2020   Procedure: MICRO LUMBER DECOMPRESSION LUMBAR FOUR-SACRAL ONE ON LEFT AND FORAMINOTOMY LUMBAR FIVE LEFT;  Surgeon: Jene Every, MD;  Location: [facility] OR;  Service: Orthopedics;  Laterality: Left;  posterior   REVERSE SHOULDER ARTHROPLASTY Right 12/16/2016   Procedure: RIGHT REVERSE TOTAL SHOULDER ARTHROPLASTY;  Surgeon: Beverely Low, MD;  Location: [facility] OR;  Service: Orthopedics;  Laterality: Right;   SEPTOPLASTY     TONSILLECTOMY      Prior to Admission medications   Medication Sig Start Date End Date Taking? Authorizing Provider  amLODipine (NORVASC) 10 MG tablet Take 1 tablet by mouth daily. 04/20/23  Yes [provider]  APPLE CIDER VINEGAR PO Take 1 capsule by mouth daily.   Yes [provider]  Ascorbic Acid (VITAMIN C) 1000 MG tablet Take 1,000 mg by mouth daily.   Yes [provider]  Cholecalciferol (VITAMIN D-3) 125 MCG (5000 UT) TABS Take 5,000  Units by mouth in the morning and at bedtime.   Yes [provider]  finasteride (PROSCAR) 5 MG tablet Take 5 mg by mouth daily.   Yes [provider]  fluticasone Aleda Grana) 50 MCG/ACT nasal spray  09/10/21  Yes [provider]  folic acid (FOLVITE) 1 MG tablet Take 1 mg by mouth daily.   Yes [provider]  hydrochlorothiazide (HYDRODIURIL) 25 MG tablet Take 25 mg by mouth daily.   Yes [provider]  levocetirizine (XYZAL) 5 MG tablet Take 5 mg by mouth at bedtime.    Yes [provider]  levothyroxine (SYNTHROID, LEVOTHROID) 112 MCG tablet Take 112 mcg by mouth daily before breakfast.   Yes [provider]  modafinil (PROVIGIL) 200 MG tablet Take 200 mg by mouth daily. In the morning   Yes [provider]  Multiple Vitamin (MULTIVITAMIN WITH MINERALS) TABS tablet Take 1 tablet by mouth daily. One A Day for Men 50+   Yes [provider]  omeprazole (PRILOSEC OTC) 20 MG tablet Take 20 mg by mouth daily before breakfast.    Yes [provider]  oxyCODONE (OXYCONTIN) 40 mg 12 hr tablet Take 40 mg by mouth every 12 (twelve) hours.   Yes [provider]  predniSONE (DELTASONE) 5 MG tablet Take 15 mg by mouth daily.   Yes [provider]  Probiotic Product (PROBIOTIC COLON SUPPORT  PO) Take 1 capsule by mouth daily in the afternoon. Garden of Life   Yes [provider]  rosuvastatin (CRESTOR) 5 MG tablet Take 1 tablet by mouth daily. 04/20/23  Yes [provider]  tacrolimus (PROTOPIC) 0.1 % ointment Apply 1 Application topically as needed (skin issues). 01/06/22  Yes [provider]  tamsulosin (FLOMAX) 0.4 MG CAPS capsule Take 0.8 mg by mouth daily.   Yes [provider]  triamcinolone ointment (KENALOG) 0.1 % Apply 1 application topically in the morning and at bedtime. 03/10/20  Yes [provider]  valsartan (DIOVAN) 80 MG tablet Take 80 mg by mouth  daily. 02/09/20  Yes [provider]  vitamin B-12 (CYANOCOBALAMIN) 1000 MCG tablet Take 1,000 mcg by mouth daily.   Yes [provider]  Zinc 50 MG CAPS Take 50 mg by mouth daily. In the morning   Yes [provider]  docusate sodium (COLACE) 100 MG capsule Take 1 capsule (100 mg total) by mouth 2 (two) times daily. Patient not taking: Reported on 11/03/2023 04/24/20   Dorothy Spark, PA-C  methotrexate (RHEUMATREX) 2.5 MG tablet Take 15 mg by mouth every Saturday. 6 tablets once a week Patient not taking: Reported on 11/03/2023 03/17/20   [provider]  oxyCODONE (OXY IR/ROXICODONE) 5 MG immediate release tablet Take 1 tablet (5 mg total) by mouth every 4 (four) hours as needed for moderate pain ((score 4 to 6)). 04/24/20   Dorothy Spark, PA-C   Allergies  Allergen Reactions   Benzoic Acid Hives and Rash    Positive Patch Test 11/12/20   Imidurea Rash    Positive Patch Test 11/12/20   Urea Rash    Positive Patch Test 11/12/20   Other Rash and Other (See Comments)    " DARK DYES " [ UNSPECIFIED DYE ]    Social History   Tobacco Use   Smoking status: Former    Current packs/day: 0.00    Average packs/day: 3.0 packs/day for 20.0 years (60.0 ttl pk-yrs)    Types: Cigarettes    Start date: 12/09/1966    Quit date: 12/09/1986    Years since quitting: 36.9   Smokeless tobacco: Never  Substance Use Topics   Alcohol use: Yes    Comment: social    History reviewed. No pertinent family history.   Review of Systems  Positive ROS: neg  All other systems have been reviewed and were otherwise negative with the exception of those mentioned in the HPI and as above.  Objective: Vital signs in last 24 hours: Temp:  [98.6 F (37 C)] 98.6 F (37 C) (03/31 0618) Pulse Rate:  [81] 81 (03/31 0618) Resp:  [18] 18 (03/31 0618) BP: (112)/(58) 112/58 (03/31 0618) SpO2:  [98 %] 98 % (03/31 0618) Weight:  [244 kg] 103 kg (03/31 0618)  General Appearance:  Alert, cooperative, no distress, appears stated age Head: Normocephalic, without obvious abnormality, atraumatic Eyes: PERRL, conjunctiva/corneas clear, EOM's intact    Neck: Supple, symmetrical, trachea midline Back: Symmetric, no curvature, ROM normal, no CVA tenderness Lungs:  respirations unlabored Heart: Regular rate and rhythm Abdomen: Soft, non-tender Extremities: Extremities normal, atraumatic, no cyanosis or edema Pulses: 2+ and symmetric all extremities Skin: Skin color, texture, turgor normal, no rashes or lesions  NEUROLOGIC:   Mental status: Alert and oriented x4,  no aphasia, good attention span, fund of knowledge, and memory Motor Exam - grossly normal except L foot drop, R HF and KE 2-3/5 Sensory Exam -  grossly normal Reflexes: 1+ Coordination - grossly normal Gait - grossly normal Balance - grossly normal Cranial Nerves: I: smell Not tested  II: visual acuity  OS: nl    OD: nl  II: visual fields Full to confrontation  II: pupils Equal, round, reactive to light  III,VII: ptosis None  III,IV,VI: extraocular muscles  Full ROM  V: mastication Normal  V: facial light touch sensation  Normal  V,VII: corneal reflex  Present  VII: facial muscle function - upper  Normal  VII: facial muscle function - lower Normal  VIII: hearing Not tested  IX: soft palate elevation  Normal  IX,X: gag reflex Present  XI: trapezius strength  5/5  XI: sternocleidomastoid strength 5/5  XI: neck flexion strength  5/5  XII: tongue strength  Normal    Data Review Lab Results  Component Value Date   WBC 10.1 11/03/2023   HGB 12.5 (L) 11/03/2023   HCT 37.0 (L) 11/03/2023   MCV 95.9 11/03/2023   PLT 215 11/03/2023   Lab Results  Component Value Date   NA 134 (L) 11/03/2023   K 3.7 11/03/2023   CL 95 (L) 11/03/2023   CO2 25 11/03/2023   BUN 9 11/03/2023   CREATININE 0.77 11/03/2023   GLUCOSE 116 (H) 11/03/2023   Lab Results  Component Value Date   INR 1.0 11/03/2023     Assessment/Plan:  Estimated body mass index is 30.79 kg/m as calculated from the following:   Height as of this encounter: 6' (1.829 m).   Weight as of this encounter: 103 kg. Patient admitted for PLIF L3-4. Patient has failed a reasonable attempt at conservative therapy.  I explained the condition and procedure to the patient and answered any questions.  Patient wishes to proceed with procedure as planned. Understands risks/ benefits and typical outcomes of procedure.   Tia Alert 11/13/2023 7:16 AM

## 2023-11-13 NOTE — Op Note (Addendum)
 11/13/2023  10:43 AM  PATIENT:  Norman Lambert  73 y.o. 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 Decompressive lumbar laminectomy, hemi facetectomy and foraminotomies L3-4 requiring more work than would be required for a simple exposure of the disk for PLIF in order to adequately decompress the neural elements and address the spinal stenosis, with resection of right L3-4 synovial cyst 2. Posterior lumbar interbody fusion L3-4 using PTI interbody cages packed with morcellized allograft and autograft  3. Posterior fixation L3-4 using ATEC cortical pedicle screws.  4. Intertransverse arthrodesis L3-4 using morcellized autograft and allograft.  SURGEON:  Marikay Alar, MD  ASSISTANTS: Verlin Dike, FNP, Dr Barnett Abu  ANESTHESIA:  General  EBL: 100 ml  Total I/O In: -  Out: 100 [Blood:100]  BLOOD ADMINISTERED: None  DRAINS: none   INDICATION FOR PROCEDURE: This patient presented with back pain with right leg pain with leg weakness. Imaging revealed previous left L3-4 decompression but he had recurrent spinal stenosis with a large right sided disc herniation with a large inferior free fragment and a right L3-4 synovial cyst. The patient tried a reasonable attempt at conservative medical measures without relief. I recommended decompression and instrumented fusion to address the stenosis as well as the segmental  instability.  Patient understood the risks, benefits, and alternatives and potential outcomes and wished to proceed.  PROCEDURE DETAILS:  The patient was brought to the operating room. After induction of generalized endotracheal anesthesia the patient was rolled into the prone position on chest rolls and all pressure points  were padded. The patient's lumbar region was cleaned and then prepped with DuraPrep and draped in the usual sterile fashion. Anesthesia was injected and then a dorsal midline incision was made and carried down to the lumbosacral fascia. The fascia was opened and the paraspinous musculature was taken down in a subperiosteal fashion to expose L3-4. A self-retaining retractor was placed. Intraoperative fluoroscopy confirmed my level, and I started with placement of the L3 cortical pedicle screws. The pedicle screw entry zones were identified utilizing surface landmarks and  AP and lateral fluoroscopy. I scored the cortex with the high-speed drill and then used the hand drill to drill an upward and outward direction into the pedicle. I then tapped line to line. I then placed a 6.5 x 40 mm cortical pedicle screw into the pedicles of L3 on the left and a 5.5 x 40 mm screw on the right.    I then turned my attention to the decompression and complete lumbar laminectomies, hemi- facetectomies, and foraminotomies were performed at L3-4.  My nurse practitioner was directly involved in the decompression and exposure of the neural elements. the patient had significant spinal stenosis and this required more work than would be required for a simple exposure of the disc for posterior lumbar interbody fusion which would only require a limited laminotomy. Much more generous decompression and generous foraminotomy was undertaken in order to adequately decompress the neural elements and address the patient's leg pain. The yellow ligament was removed to expose the underlying dura and nerve roots, and generous foraminotomies were performed to adequately decompress the neural elements. Both the exiting and traversing nerve roots were decompressed on both sides until a coronary dilator passed easily along the nerve roots.  There was a fairly sizable right sided L3-4 synovial cyst that was quite adherent to the dura.  I spent considerable  time peeling this away from the dura and removing this from the surface of the L4 nerve root.  At no time did I see an unintended durotomy.  Once the decompression was complete, I turned my attention to the posterior lower lumbar interbody fusion. The epidural venous vasculature was coagulated and cut sharply. Disc space was incised and the initial discectomy was performed with pituitary rongeurs. The disc space was distracted with sequential distractors to a height of 9 mm. We then used a series of scrapers and shavers to prepare the endplates for fusion. The midline was prepared with Epstein curettes. Once the complete discectomy was finished, we packed an appropriate sized interbody cage with local autograft and morcellized allograft, gently retracted the nerve root, and tapped the cage into position at L3-4.  The midline between the cages was packed with morselized autograft and allograft.   We then turned our attention to the placement of the lower pedicle screws. The pedicle screw entry zones were identified utilizing surface landmarks and fluoroscopy. I drilled into each pedicle utilizing the hand drill, and tapped each pedicle with the appropriate tap. We palpated with a ball probe to assure no break in the cortex. We then placed 6.5 x 45 mm pedicle screws into the pedicles bilaterally at L4.  My nurse practitioner assisted in placement of the pedicle screws.  We then decorticated the transverse processes and laid a mixture of morcellized autograft and allograft out over these to perform intertransverse arthrodesis at L3-4. We then placed lordotic rods into the multiaxial screw heads of the pedicle screws and locked these in position with the locking caps and anti-torque device. We then checked our construct with AP and lateral fluoroscopy. Irrigated with copious amounts of Vashe solution. Inspected the nerve roots once again to assure adequate decompression, lined to the dura with Gelfoam, laced powdered  vancomycin into the wound, and then we closed the muscle and the fascia with 0 Vicryl. Closed the subcutaneous tissues with 2-0 Vicryl and subcuticular tissues with 3-0 Vicryl. The skin was closed with benzoin and Steri-Strips. Dressing was then applied, the patient was awakened from general anesthesia and transported to the recovery room in stable condition. At the end of the procedure all sponge, needle and instrument counts were correct.   PLAN OF CARE: admit to inpatient  PATIENT DISPOSITION:  PACU - hemodynamically stable.   Delay start of Pharmacological VTE agent (>24hrs) due to surgical blood loss or risk of bleeding:  yes

## 2023-11-13 NOTE — Anesthesia Procedure Notes (Addendum)
 Procedure Name: Intubation Date/Time: 11/13/2023 7:49 AM  Performed by: Camillia Herter, CRNAPre-anesthesia Checklist: Patient identified, Emergency Drugs available, Suction available and Patient being monitored Patient Re-evaluated:Patient Re-evaluated prior to induction Oxygen Delivery Method: Circle System Utilized Preoxygenation: Pre-oxygenation with 100% oxygen Induction Type: IV induction Ventilation: Mask ventilation without difficulty Laryngoscope Size: Miller and 2 Grade View: Grade I Tube type: Oral Tube size: 8.0 mm Number of attempts: 1 Airway Equipment and Method: Stylet Placement Confirmation: ETT inserted through vocal cords under direct vision, positive ETCO2 and breath sounds checked- equal and bilateral Secured at: 22 cm Tube secured with: Tape Dental Injury: Teeth and Oropharynx as per pre-operative assessment

## 2023-11-13 NOTE — Plan of Care (Signed)

## 2023-11-13 NOTE — Transfer of Care (Signed)
 Immediate Anesthesia Transfer of Care Note  Patient: Norman Lambert  Procedure(s) Performed: POSTERIOR LUMBAR INTERBODY FUSION POSTERIOR LATERAL INTERBODY FUSION LUMBAR THREE-LUMBAR FOUR (Back)  Patient Location: PACU  Anesthesia Type:General  Level of Consciousness: drowsy  Airway & Oxygen Therapy: Patient Spontanous Breathing and Patient connected to face mask oxygen  Post-op Assessment: Report given to RN and Post -op Vital signs reviewed and stable  Post vital signs: Reviewed and stable  Last Vitals:  Vitals Value Taken Time  BP 96/48 11/13/23 1100  Temp 36.6 C 11/13/23 1053  Pulse 67 11/13/23 1102  Resp 12 11/13/23 1102  SpO2 98 % 11/13/23 1102  Vitals shown include unfiled device data.  Last Pain:  Vitals:   11/13/23 0618  TempSrc: Oral  PainSc: 8       Patients Stated Pain Goal: 2 (11/13/23 0618)  Complications: No notable events documented.

## 2023-11-13 NOTE — Anesthesia Postprocedure Evaluation (Signed)
 Anesthesia Post Note  Patient: Norman Lambert  Procedure(s) Performed: POSTERIOR LUMBAR INTERBODY FUSION POSTERIOR LATERAL INTERBODY FUSION LUMBAR THREE-LUMBAR FOUR (Back)     Patient location during evaluation: PACU Anesthesia Type: General Level of consciousness: awake Pain management: pain level controlled Vital Signs Assessment: post-procedure vital signs reviewed and stable Respiratory status: spontaneous breathing, nonlabored ventilation and respiratory function stable Cardiovascular status: blood pressure returned to baseline and stable Postop Assessment: no apparent nausea or vomiting Anesthetic complications: no   No notable events documented.  Last Vitals:  Vitals:   11/13/23 1245 11/13/23 1335  BP: 101/64 99/78  Pulse: 72 90  Resp: 12 20  Temp: 36.6 C 36.5 C  SpO2: 99% 99%    Last Pain:  Vitals:   11/13/23 1345  TempSrc:   PainSc: 6                  Shunte Senseney P Addylin Manke

## 2023-11-14 DIAGNOSIS — M960 Pseudarthrosis after fusion or arthrodesis: Secondary | ICD-10-CM | POA: Diagnosis not present

## 2023-11-14 NOTE — Care Management Obs Status (Signed)
 MEDICARE OBSERVATION STATUS NOTIFICATION   Patient Details  Name: Norman Lambert MRN: [id number] Date of Birth: Jan 29, 1951   Medicare Observation Status Notification Given:  Yes    Kermit Balo, RN 11/14/2023, 10:13 AM

## 2023-11-14 NOTE — Evaluation (Addendum)
 Occupational Therapy Evaluation Patient Details Name: Norman Lambert MRN: [id number] DOB: Dec 23, 1950 Today's Date: 11/14/2023   History of Present Illness   73 yo male s/p 11/13/23 PLIF L3-4 PMH gout, arthritis, HTN, HLD, HTN, PVD, RBBB, R reverse shoulder , sleep apnea, back surgery     Clinical Impressions Patient is s/p PLIF L3-4 surgery resulting in functional limitations due to the deficits listed below (see OT problem list). Pt at this time able to don afo indep and needs reacher for LB dressing. Pt cued for back precautions and reinforced education with return demo.  Patient will benefit from skilled OT acutely to increase independence and safety with ADLS to allow discharge hhot.      If plan is discharge home, recommend the following:   A little help with walking and/or transfers;A little help with bathing/dressing/bathroom     Functional Status Assessment   Patient has had a recent decline in their functional status and demonstrates the ability to make significant improvements in function in a reasonable and predictable amount of time.     Equipment Recommendations   None recommended by OT     Recommendations for Other Services         Precautions/Restrictions   Precautions Precautions: Back Recall of Precautions/Restrictions: Intact Precaution/Restrictions Comments: handout provided Required Braces or Orthoses: Spinal Brace;Other Brace (AFO LLE) Spinal Brace: Lumbar corset;Applied in sitting position (no present as they got it from [employer] but left it at home) Other Brace: AFO L LE Restrictions Weight Bearing Restrictions Per Provider Order: No     Mobility Bed Mobility Overal bed mobility: Needs Assistance Bed Mobility: Rolling, Supine to Sit Rolling: Contact guard assist   Supine to sit: Min assist     General bed mobility comments: min cues for back precautions to roll into sitting and then push up with R UE    Transfers Overall transfer  level: Needs assistance   Transfers: Sit to/from Stand Sit to Stand: Mod assist, From elevated surface           General transfer comment: cues for hand placement and (A) to elevate from surface. educated on positioning at eob to help power up      Balance Overall balance assessment: Mild deficits observed, not formally tested                                         ADL either performed or assessed with clinical judgement   ADL Overall ADL's : Needs assistance/impaired Eating/Feeding: Independent                   Lower Body Dressing: Contact guard assist;Cueing for back precautions;Sit to/from stand;With adaptive equipment   Toilet Transfer: Contact guard assist             General ADL Comments: pt educated on use of reacher to help with LB dressing  Back handout provided and reviewed adls in detail. Pt educated on: clothing between brace, never sleep in brace, set an alarm at night for medication, avoid sitting for long periods of time, correct bed positioning for sleeping, correct sequence for bed mobility, avoiding lifting more than 5 pounds and never wash directly over incision. All education is complete and patient indicates understanding.    Vision Patient Visual Report: No change from baseline       Perception  Praxis         Pertinent Vitals/Pain Pain Assessment Pain Assessment: No/denies pain     Extremity/Trunk Assessment Upper Extremity Assessment Upper Extremity Assessment: Overall WFL for tasks assessed   Lower Extremity Assessment Lower Extremity Assessment: Generalized weakness   Cervical / Trunk Assessment Cervical / Trunk Assessment: Back Surgery   Communication Communication Communication: No apparent difficulties   Cognition Arousal: Alert Behavior During Therapy: WFL for tasks assessed/performed Cognition: No apparent impairments                               Following commands:  Intact       Cueing  General Comments      incision dry and intact   Exercises     Shoulder Instructions      Home Living Family/patient expects to be discharged to:: Private residence Living Arrangements: Spouse/significant other Available Help at Discharge: Family;Available 24 hours/day Type of Home: House Home Access: Level entry     Home Layout: One level     Bathroom Shower/Tub: Producer, television/film/video: Handicapped height     Home Equipment: Agricultural consultant (2 wheels)  Has a Engineer, site         Prior Functioning/Environment Prior Level of Function : Needs assist               ADLs Comments: indep with AFO  L LE    OT Problem List: Impaired balance (sitting and/or standing);Decreased activity tolerance;Decreased safety awareness;Decreased knowledge of use of DME or AE;Decreased knowledge of precautions   OT Treatment/Interventions: Self-care/ADL training;Therapeutic exercise;Energy conservation;DME and/or AE instruction;Manual therapy;Therapeutic activities;Patient/family education;Balance training      OT Goals(Current goals can be found in the care plan section)   Acute Rehab OT Goals Patient Stated Goal: to go home today OT Goal Formulation: With patient Time For Goal Achievement: 11/28/23 Potential to Achieve Goals: Good   OT Frequency:  Min 2X/week    Co-evaluation              AM-PAC OT "6 Clicks" Daily Activity     Outcome Measure Help from another person eating meals?: None Help from another person taking care of personal grooming?: None Help from another person toileting, which includes using toliet, bedpan, or urinal?: None Help from another person bathing (including washing, rinsing, drying)?: A Little Help from another person to put on and taking off regular upper body clothing?: None Help from another person to put on and taking off regular lower body clothing?: A Little 6 Click Score: 22   End of Session  Equipment Utilized During Treatment: Rolling walker (2 wheels) Nurse Communication: Mobility status;Precautions  Activity Tolerance: Patient tolerated treatment well Patient left: in bed;with call bell/phone within reach;with family/visitor present  OT Visit Diagnosis: Unsteadiness on feet (R26.81);Muscle weakness (generalized) (M62.81)                Time: 4098-1191 OT Time Calculation (min): 29 min Charges:  OT General Charges $OT Visit: 1 Visit OT Evaluation $OT Eval Moderate Complexity: 1 Mod OT Treatments $Self Care/Home Management : 8-22 mins   Brynn, OTR/L  Acute Rehabilitation Services Office: [phone] .   Mateo Flow 11/14/2023, 9:40 AM

## 2023-11-14 NOTE — Evaluation (Signed)
 Physical Therapy Evaluation Patient Details Name: Norman Lambert MRN: [id number] DOB: 1951/04/03 Today's Date: 11/14/2023  History of Present Illness  73 yo male s/p 11/13/23 PLIF L3-4 PMH gout, arthritis, HTN, HLD, HTN, PVD, RBBB, R reverse shoulder , sleep apnea, back surgery  Clinical Impression  Pt admitted with above diagnosis. Pt from home with wife where he activity has been severely limited past month due to back pain and an episode of falling when LE's buckled under him. Pt currently mobilizing at [facility] level which his wife is able to give him. Ambulated 160' with RW, needed to sit after this due to increased pain at surgical site. Pain relieved with sitting down. Educated on proper posture, positioning, and activity modifications. Rec HHPT at d/c. Pt currently with functional limitations due to the deficits listed below (see PT Problem List). Pt will benefit from acute skilled PT to increase their independence and safety with mobility to allow discharge.           If plan is discharge home, recommend the following: A little help with walking and/or transfers;A little help with bathing/dressing/bathroom;Assistance with cooking/housework;Assist for transportation   Can travel by private vehicle        Equipment Recommendations None recommended by PT  Recommendations for Other Services       Functional Status Assessment Patient has had a recent decline in their functional status and demonstrates the ability to make significant improvements in function in a reasonable and predictable amount of time.     Precautions / Restrictions Precautions Precautions: Back Precaution Booklet Issued: Yes (comment) Recall of Precautions/Restrictions: Impaired Precaution/Restrictions Comments: remembered twisting from OT session, needed cues for bending and lifting Required Braces or Orthoses: Spinal Brace Spinal Brace: Lumbar corset;Applied in sitting position (pt left in car) Other Brace: AFO L  LE Restrictions Weight Bearing Restrictions Per Provider Order: No      Mobility  Bed Mobility Overal bed mobility: Needs Assistance Bed Mobility: Rolling, Sidelying to Sit, Sit to Supine Rolling: Contact guard assist Sidelying to sit: Contact guard assist   Sit to supine: Contact guard assist   General bed mobility comments: pt did not need physical assist but did need vc's to keep both arms in front of body  to not twist. Able to come up from flat bed    Transfers Overall transfer level: Needs assistance Equipment used: Rolling walker (2 wheels) Transfers: Sit to/from Stand Sit to Stand: Contact guard assist           General transfer comment: pt did remember proper hand placement. CGA for safety    Ambulation/Gait Ambulation/Gait assistance: Contact guard assist, Min assist Gait Distance (Feet): 160 Feet Assistive device: Rolling walker (2 wheels) Gait Pattern/deviations: Step-through pattern, Trunk flexed (forward head) Gait velocity: decreased Gait velocity interpretation: 1.31 - 2.62 ft/sec, indicative of limited community ambulator   General Gait Details: vc's for fwd gaze which helped minimize fwd head. Pt with increased pain last ~40', CGA to min A in that time for increased safety. No buckling of LE"s  Stairs            Wheelchair Mobility     Tilt Bed    Modified Rankin (Stroke Patients Only)       Balance Overall balance assessment: Needs assistance Sitting-balance support: Feet supported, No upper extremity supported Sitting balance-Leahy Scale: Fair Sitting balance - Comments: occasional posterior bias   Standing balance support: Bilateral upper extremity supported, During functional activity, Reliant on assistive device for balance  Standing balance-Leahy Scale: Poor Standing balance comment: leans hevaily onto RW                             Pertinent Vitals/Pain Pain Assessment Pain Assessment: 0-10 Pain Score: 9   Pain Location: surgical site and into hips after ambulation Pain Descriptors / Indicators: Sore, Operative site guarding Pain Intervention(s): Limited activity within patient's tolerance, Monitored during session    Home Living Family/patient expects to be discharged to:: Private residence Living Arrangements: Spouse/significant other Available Help at Discharge: Family;Available 24 hours/day Type of Home: House Home Access: Level entry       Home Layout: One level Home Equipment: Agricultural consultant (2 wheels);Shower seat;BSC/3in1;Cane - single point      Prior Function Prior Level of Function : Needs assist             Mobility Comments: pt's wife reports pt has been unable to ambulate past month due to back pain and LE's buckling in standing ADLs Comments: indep with AFO  L LE     Extremity/Trunk Assessment   Upper Extremity Assessment Upper Extremity Assessment: Defer to OT evaluation    Lower Extremity Assessment Lower Extremity Assessment: Generalized weakness    Cervical / Trunk Assessment Cervical / Trunk Assessment: Back Surgery  Communication   Communication Communication: No apparent difficulties    Cognition Arousal: Alert Behavior During Therapy: WFL for tasks assessed/performed   PT - Cognitive impairments: Memory                       PT - Cognition Comments: needed reviewing of back precautions 30 mins after he had learned them. Wife remembered though Following commands: Intact       Cueing Cueing Techniques: Verbal cues     General Comments General comments (skin integrity, edema, etc.): VSS on RA. Reviewed proper posture, positioning, and activity modifications for d/c home    Exercises Other Exercises Other Exercises: pelvic tilt in bed x5   Assessment/Plan    PT Assessment Patient needs continued PT services  PT Problem List Decreased strength;Decreased activity tolerance;Decreased balance;Decreased mobility;Decreased  knowledge of precautions;Pain       PT Treatment Interventions DME instruction;Gait training;Functional mobility training;Therapeutic activities;Therapeutic exercise;Balance training;Patient/family education    PT Goals (Current goals can be found in the Care Plan section)  Acute Rehab PT Goals Patient Stated Goal: return home PT Goal Formulation: With patient/family Time For Goal Achievement: 11/21/23 Potential to Achieve Goals: Good    Frequency Min 3X/week     Co-evaluation               AM-PAC PT "6 Clicks" Mobility  Outcome Measure Help needed turning from your back to your side while in a flat bed without using bedrails?: A Little Help needed moving from lying on your back to sitting on the side of a flat bed without using bedrails?: A Little Help needed moving to and from a bed to a chair (including a wheelchair)?: A Little Help needed standing up from a chair using your arms (e.g., wheelchair or bedside chair)?: A Little Help needed to walk in hospital room?: A Little Help needed climbing 3-5 steps with a railing? : A Lot 6 Click Score: 17    End of Session Equipment Utilized During Treatment: Gait belt Activity Tolerance: Patient tolerated treatment well Patient left: in bed;with family/visitor present;with call bell/phone within reach Nurse Communication: Mobility status PT Visit  Diagnosis: Unsteadiness on feet (R26.81);History of falling (Z91.81);Muscle weakness (generalized) (M62.81);Pain Pain - part of body:  (back)    Time: 1610-9604 PT Time Calculation (min) (ACUTE ONLY): 21 min   Charges:   PT Evaluation $PT Eval Moderate Complexity: 1 Mod   PT General Charges $$ ACUTE PT VISIT: 1 Visit         Lyanne Co, PT  Acute Rehab Services Secure chat preferred Office [phone]   Lawana Chambers Enaya Howze 11/14/2023, 9:56 AM

## 2023-11-14 NOTE — Plan of Care (Signed)

## 2023-11-14 NOTE — Progress Notes (Signed)
 Patient awaiting family for discharge home, Patient in no acute distress nor complaints of pain nor discomfort; incision on back is clean, dry and intact; No c/o pain at this time. Room was checked and accounted for all patient's belongings; discharge instructions concerning her medications, incision care, follow up appointment and when to call the doctor as needed were all discussed with patient by RN and he expressed understanding on the instructions given.

## 2023-11-14 NOTE — Discharge Summary (Signed)
 Physician Discharge Summary  Patient ID: Norman Lambert MRN: [id number] DOB/AGE: 1951-02-06 73 y.o.  Admit date: 11/13/2023 Discharge date: 11/14/2023  Admission Diagnoses:  Recurrent L3-4 spinal stenosis, large right L3-4 distribution with large inferior free fragment, right L3-4 synovial cyst impressing the right L4 nerve root, back pain with right leg pain and progressive leg weakness    Discharge Diagnoses: same   Discharged Condition: good  Hospital Course: The patient was admitted on 11/13/2023 and taken to the operating room where the patient underwent PLIF L3-4. The patient tolerated the procedure well and was taken to the recovery room and then to the floor in stable condition. The hospital course was routine. There were no complications. The wound remained clean dry and intact. Pt had appropriate back soreness. No complaints of leg pain or new N/T/W. The patient remained afebrile with stable vital signs, and tolerated a regular diet. The patient continued to increase activities, and pain was well controlled with oral pain medications.   Consults: None  Significant Diagnostic Studies:  Results for orders placed or performed during the hospital encounter of 11/13/23  ABO/Rh   Collection Time: 11/13/23  7:25 AM  Result Value Ref Range   ABO/RH(D)      O POS Performed at [facility], [address], [place], [place] [address]     DG Lumbar Spine 2-3 Views Result Date: 11/13/2023 CLINICAL DATA:  Posterolateral interbody fusion L3-4. EXAM: LUMBAR SPINE - 2-3 VIEW COMPARISON:  Intraoperative radiographs 09/28/2022. Outside lumbar MRI 09/28/2023. FINDINGS: C-arm fluoroscopy was provided in the operating room without the presence of a radiologist.54.2 seconds fluoroscopy time. 41.2 mGy air kerma. 4 C-arm fluoroscopic images were obtained intraoperatively and are submitted for post operative interpretation. Assuming the presence of 5 lumbar type vertebral bodies, these images  demonstrate the placement of bilateral pedicle screws and an interbody spacer at the L3-4 level. The interconnecting rods have not yet been placed. No complications are identified. Please see intraoperative findings for further detail. IMPRESSION: Intraoperative fluoroscopic guidance for L3-4 fusion. Electronically Signed   By: Carey Bullocks M.D.   On: 11/13/2023 14:21   DG C-Arm 1-60 Min-No Report Result Date: 11/13/2023 Fluoroscopy was utilized by the requesting physician.  No radiographic interpretation.   DG C-Arm 1-60 Min-No Report Result Date: 11/13/2023 Fluoroscopy was utilized by the requesting physician.  No radiographic interpretation.   DG C-Arm 1-60 Min-No Report Result Date: 11/13/2023 Fluoroscopy was utilized by the requesting physician.  No radiographic interpretation.    Antibiotics:  Anti-infectives (From admission, onward)    Start     Dose/Rate Route Frequency Ordered Stop   11/13/23 1600  ceFAZolin (ANCEF) IVPB 2g/100 mL premix        2 g 200 mL/hr over 30 Minutes Intravenous Every 8 hours 11/13/23 1301 11/14/23 0040   11/13/23 1017  vancomycin (VANCOCIN) powder  Status:  Discontinued          As needed 11/13/23 1018 11/13/23 1048   11/13/23 0600  ceFAZolin (ANCEF) IVPB 2g/100 mL premix        2 g 200 mL/hr over 30 Minutes Intravenous On call to O.R. 11/13/23 0554 11/13/23 0813       Discharge Exam: Blood pressure (!) 102/57, pulse 64, temperature 97.7 F (36.5 C), temperature source Oral, resp. rate 20, height 6' (1.829 m), weight 103 kg, SpO2 97%. Neurologic: Grossly normal Ambulating and voiding well incision cdi   Discharge Medications:   Allergies as of 11/14/2023  Reactions   Benzoic Acid Hives, Rash   Positive Patch Test 11/12/20   Imidurea Rash   Positive Patch Test 11/12/20   Urea Rash   Positive Patch Test 11/12/20   Other Rash, Other (See Comments)   " DARK DYES " [ UNSPECIFIED DYE ]        Medication List     TAKE these  medications    amLODipine 10 MG tablet Commonly known as: NORVASC Take 1 tablet by mouth daily.   APPLE CIDER VINEGAR PO Take 1 capsule by mouth daily.   cyanocobalamin 1000 MCG tablet Commonly known as: VITAMIN B12 Take 1,000 mcg by mouth daily.   docusate sodium 100 MG capsule Commonly known as: COLACE Take 1 capsule (100 mg total) by mouth 2 (two) times daily.   finasteride 5 MG tablet Commonly known as: PROSCAR Take 5 mg by mouth daily.   fluticasone 50 MCG/ACT nasal spray Commonly known as: FLONASE   folic acid 1 MG tablet Commonly known as: FOLVITE Take 1 mg by mouth daily.   hydrochlorothiazide 25 MG tablet Commonly known as: HYDRODIURIL Take 25 mg by mouth daily.   levocetirizine 5 MG tablet Commonly known as: XYZAL Take 5 mg by mouth at bedtime.   levothyroxine 112 MCG tablet Commonly known as: SYNTHROID Take 112 mcg by mouth daily before breakfast.   methotrexate 2.5 MG tablet Commonly known as: RHEUMATREX Take 15 mg by mouth every Saturday. 6 tablets once a week   modafinil 200 MG tablet Commonly known as: PROVIGIL Take 200 mg by mouth daily. In the morning   multivitamin with minerals Tabs tablet Take 1 tablet by mouth daily. One A Day for Men 50+   omeprazole 20 MG tablet Commonly known as: PRILOSEC OTC Take 20 mg by mouth daily before breakfast.   OxyCONTIN 40 mg 12 hr tablet Generic drug: oxyCODONE Take 40 mg by mouth every 12 (twelve) hours.   oxyCODONE 5 MG immediate release tablet Commonly known as: Oxy IR/ROXICODONE Take 1 tablet (5 mg total) by mouth every 4 (four) hours as needed for moderate pain ((score 4 to 6)).   predniSONE 5 MG tablet Commonly known as: DELTASONE Take 15 mg by mouth daily.   PROBIOTIC COLON SUPPORT PO Take 1 capsule by mouth daily in the afternoon. [employer] of [employer]   rosuvastatin 5 MG tablet Commonly known as: CRESTOR Take 1 tablet by mouth daily.   tacrolimus 0.1 % ointment Commonly known as:  PROTOPIC Apply 1 Application topically as needed (skin issues).   tamsulosin 0.4 MG Caps capsule Commonly known as: FLOMAX Take 0.8 mg by mouth daily.   triamcinolone ointment 0.1 % Commonly known as: KENALOG Apply 1 application topically in the morning and at bedtime.   valsartan 80 MG tablet Commonly known as: DIOVAN Take 80 mg by mouth daily.   vitamin C 1000 MG tablet Take 1,000 mg by mouth daily.   Vitamin D-3 125 MCG (5000 UT) Tabs Take 5,000 Units by mouth in the morning and at bedtime.   Zinc 50 MG Caps Take 50 mg by mouth daily. In the morning               Durable Medical Equipment  (From admission, onward)           Start     Ordered   11/13/23 1302  DME Walker rolling  Once       Question:  Patient needs a walker to treat with the following condition  Answer:  S/P lumbar fusion   11/13/23 1301   11/13/23 1302  DME 3 n 1  Once        11/13/23 1301            Disposition: Home  Final Dx: PLIF L3-4  Discharge Instructions      Remove dressing in 72 hours   Complete by: As directed    Call MD for:   Complete by: As directed    Call MD for:  difficulty breathing, headache or visual disturbances   Complete by: As directed    Call MD for:  hives   Complete by: As directed    Call MD for:  persistant dizziness or light-headedness   Complete by: As directed    Call MD for:  persistant nausea and vomiting   Complete by: As directed    Call MD for:  redness, tenderness, or signs of infection (pain, swelling, redness, odor or green/yellow discharge around incision site)   Complete by: As directed    Call MD for:  severe uncontrolled pain   Complete by: As directed    Call MD for:  temperature >100.4   Complete by: As directed    Diet - low sodium heart healthy   Complete by: As directed    Driving Restrictions   Complete by: As directed    No driving for 2 weeks, no riding in the car for 1 week   Face-to-face encounter (required for  Medicare/Medicaid patients)   Complete by: As directed    I Sherryl Manges certify that this patient is under my care and that I, or a nurse practitioner or physician's assistant working with me, had a face-to-face encounter that meets the physician face-to-face encounter requirements with this patient on 11/14/2023. The encounter with the patient was in whole, or in part for the following medical condition(s) which is the primary reason for home health care (List medical condition): postop spine fusion   The encounter with the patient was in whole, or in part, for the following medical condition, which is the primary reason for home health care: postop spinal fusion   I certify that, based on my findings, the following services are medically necessary home health services: Physical therapy   Reason for Medically Necessary Home Health Services: Therapy- Investment banker, operational, Patent examiner   My clinical findings support the need for the above services: Pain interferes with ambulation/mobility   Further, I certify that my clinical findings support that this patient is homebound due to: Pain interferes with ambulation/mobility   Home Health   Complete by: As directed    To provide the following care/treatments: PT   Increase activity slowly   Complete by: As directed    Lifting restrictions   Complete by: As directed    No lifting more than 8 lbs        Follow-up Information     [facility] Follow up.   Specialty: Home Health Services Why: [facility] will contact you for the first home visit Contact information: [address] [place] [address] [phone]                  Signed: Tiana Loft [facility] 11/14/2023, 7:42 AM

## 2023-12-05 ENCOUNTER — Encounter (HOSPITAL_COMMUNITY): Payer: Self-pay | Admitting: Neurological Surgery

## 2023-12-05 ENCOUNTER — Encounter (HOSPITAL_COMMUNITY): Payer: Self-pay

## 2023-12-05 ENCOUNTER — Inpatient Hospital Stay (HOSPITAL_COMMUNITY)
Admission: AD | Admit: 2023-12-05 | Discharge: 2024-02-13 | DRG: 450 | Disposition: E | Source: Ambulatory Visit | Attending: Internal Medicine | Admitting: Internal Medicine

## 2023-12-05 DIAGNOSIS — R34 Anuria and oliguria: Secondary | ICD-10-CM

## 2023-12-05 DIAGNOSIS — Z515 Encounter for palliative care: Secondary | ICD-10-CM | POA: Diagnosis not present

## 2023-12-05 DIAGNOSIS — E785 Hyperlipidemia, unspecified: Secondary | ICD-10-CM | POA: Diagnosis present

## 2023-12-05 DIAGNOSIS — E1151 Type 2 diabetes mellitus with diabetic peripheral angiopathy without gangrene: Secondary | ICD-10-CM | POA: Diagnosis present

## 2023-12-05 DIAGNOSIS — Y793 Surgical instruments, materials and orthopedic devices (including sutures) associated with adverse incidents: Secondary | ICD-10-CM | POA: Diagnosis present

## 2023-12-05 DIAGNOSIS — K219 Gastro-esophageal reflux disease without esophagitis: Secondary | ICD-10-CM

## 2023-12-05 DIAGNOSIS — Z96611 Presence of right artificial shoulder joint: Secondary | ICD-10-CM | POA: Diagnosis present

## 2023-12-05 DIAGNOSIS — I4510 Unspecified right bundle-branch block: Secondary | ICD-10-CM | POA: Diagnosis present

## 2023-12-05 DIAGNOSIS — T81328A Disruption or dehiscence of closure of other specified internal operation (surgical) wound, initial encounter: Secondary | ICD-10-CM | POA: Diagnosis not present

## 2023-12-05 DIAGNOSIS — M8008XA Age-related osteoporosis with current pathological fracture, vertebra(e), initial encounter for fracture: Secondary | ICD-10-CM | POA: Diagnosis present

## 2023-12-05 DIAGNOSIS — E860 Dehydration: Secondary | ICD-10-CM | POA: Diagnosis present

## 2023-12-05 DIAGNOSIS — T8144XA Sepsis following a procedure, initial encounter: Secondary | ICD-10-CM | POA: Diagnosis not present

## 2023-12-05 DIAGNOSIS — R3911 Hesitancy of micturition: Secondary | ICD-10-CM | POA: Diagnosis not present

## 2023-12-05 DIAGNOSIS — D62 Acute posthemorrhagic anemia: Secondary | ICD-10-CM

## 2023-12-05 DIAGNOSIS — I10 Essential (primary) hypertension: Secondary | ICD-10-CM | POA: Diagnosis present

## 2023-12-05 DIAGNOSIS — F17290 Nicotine dependence, other tobacco product, uncomplicated: Secondary | ICD-10-CM | POA: Diagnosis present

## 2023-12-05 DIAGNOSIS — L89322 Pressure ulcer of left buttock, stage 2: Secondary | ICD-10-CM | POA: Diagnosis not present

## 2023-12-05 DIAGNOSIS — R627 Adult failure to thrive: Secondary | ICD-10-CM | POA: Diagnosis not present

## 2023-12-05 DIAGNOSIS — A4150 Gram-negative sepsis, unspecified: Secondary | ICD-10-CM | POA: Diagnosis not present

## 2023-12-05 DIAGNOSIS — J189 Pneumonia, unspecified organism: Secondary | ICD-10-CM | POA: Diagnosis not present

## 2023-12-05 DIAGNOSIS — R7881 Bacteremia: Secondary | ICD-10-CM

## 2023-12-05 DIAGNOSIS — M21372 Foot drop, left foot: Secondary | ICD-10-CM | POA: Diagnosis present

## 2023-12-05 DIAGNOSIS — Z789 Other specified health status: Secondary | ICD-10-CM | POA: Diagnosis not present

## 2023-12-05 DIAGNOSIS — E870 Hyperosmolality and hypernatremia: Secondary | ICD-10-CM | POA: Diagnosis present

## 2023-12-05 DIAGNOSIS — K5900 Constipation, unspecified: Secondary | ICD-10-CM | POA: Diagnosis not present

## 2023-12-05 DIAGNOSIS — E871 Hypo-osmolality and hyponatremia: Secondary | ICD-10-CM | POA: Diagnosis present

## 2023-12-05 DIAGNOSIS — B964 Proteus (mirabilis) (morganii) as the cause of diseases classified elsewhere: Secondary | ICD-10-CM | POA: Diagnosis not present

## 2023-12-05 DIAGNOSIS — E11649 Type 2 diabetes mellitus with hypoglycemia without coma: Secondary | ICD-10-CM | POA: Diagnosis not present

## 2023-12-05 DIAGNOSIS — D638 Anemia in other chronic diseases classified elsewhere: Secondary | ICD-10-CM | POA: Diagnosis not present

## 2023-12-05 DIAGNOSIS — Z79890 Hormone replacement therapy (postmenopausal): Secondary | ICD-10-CM

## 2023-12-05 DIAGNOSIS — R54 Age-related physical debility: Secondary | ICD-10-CM | POA: Diagnosis present

## 2023-12-05 DIAGNOSIS — G4733 Obstructive sleep apnea (adult) (pediatric): Secondary | ICD-10-CM | POA: Diagnosis present

## 2023-12-05 DIAGNOSIS — T84226A Displacement of internal fixation device of vertebrae, initial encounter: Secondary | ICD-10-CM | POA: Diagnosis present

## 2023-12-05 DIAGNOSIS — R1310 Dysphagia, unspecified: Secondary | ICD-10-CM | POA: Diagnosis not present

## 2023-12-05 DIAGNOSIS — Z66 Do not resuscitate: Secondary | ICD-10-CM | POA: Diagnosis not present

## 2023-12-05 DIAGNOSIS — L89312 Pressure ulcer of right buttock, stage 2: Secondary | ICD-10-CM | POA: Diagnosis not present

## 2023-12-05 DIAGNOSIS — N400 Enlarged prostate without lower urinary tract symptoms: Secondary | ICD-10-CM

## 2023-12-05 DIAGNOSIS — N182 Chronic kidney disease, stage 2 (mild): Secondary | ICD-10-CM | POA: Diagnosis not present

## 2023-12-05 DIAGNOSIS — R6521 Severe sepsis with septic shock: Secondary | ICD-10-CM | POA: Diagnosis not present

## 2023-12-05 DIAGNOSIS — R9431 Abnormal electrocardiogram [ECG] [EKG]: Secondary | ICD-10-CM | POA: Diagnosis not present

## 2023-12-05 DIAGNOSIS — N170 Acute kidney failure with tubular necrosis: Secondary | ICD-10-CM | POA: Diagnosis present

## 2023-12-05 DIAGNOSIS — G9341 Metabolic encephalopathy: Secondary | ICD-10-CM

## 2023-12-05 DIAGNOSIS — Z7952 Long term (current) use of systemic steroids: Secondary | ICD-10-CM

## 2023-12-05 DIAGNOSIS — Z7401 Bed confinement status: Secondary | ICD-10-CM

## 2023-12-05 DIAGNOSIS — Z981 Arthrodesis status: Secondary | ICD-10-CM

## 2023-12-05 DIAGNOSIS — E43 Unspecified severe protein-calorie malnutrition: Secondary | ICD-10-CM | POA: Diagnosis present

## 2023-12-05 DIAGNOSIS — J690 Pneumonitis due to inhalation of food and vomit: Secondary | ICD-10-CM

## 2023-12-05 DIAGNOSIS — N179 Acute kidney failure, unspecified: Secondary | ICD-10-CM | POA: Diagnosis not present

## 2023-12-05 DIAGNOSIS — Z6827 Body mass index (BMI) 27.0-27.9, adult: Secondary | ICD-10-CM

## 2023-12-05 DIAGNOSIS — S32039A Unspecified fracture of third lumbar vertebra, initial encounter for closed fracture: Secondary | ICD-10-CM | POA: Diagnosis not present

## 2023-12-05 DIAGNOSIS — J9600 Acute respiratory failure, unspecified whether with hypoxia or hypercapnia: Secondary | ICD-10-CM | POA: Diagnosis not present

## 2023-12-05 DIAGNOSIS — A419 Sepsis, unspecified organism: Secondary | ICD-10-CM | POA: Diagnosis not present

## 2023-12-05 DIAGNOSIS — G8929 Other chronic pain: Secondary | ICD-10-CM | POA: Diagnosis present

## 2023-12-05 DIAGNOSIS — L8990 Pressure ulcer of unspecified site, unspecified stage: Secondary | ICD-10-CM | POA: Insufficient documentation

## 2023-12-05 DIAGNOSIS — T84296A Other mechanical complication of internal fixation device of vertebrae, initial encounter: Secondary | ICD-10-CM | POA: Diagnosis not present

## 2023-12-05 DIAGNOSIS — A4159 Other Gram-negative sepsis: Secondary | ICD-10-CM | POA: Diagnosis not present

## 2023-12-05 DIAGNOSIS — E876 Hypokalemia: Secondary | ICD-10-CM | POA: Diagnosis present

## 2023-12-05 DIAGNOSIS — I480 Paroxysmal atrial fibrillation: Secondary | ICD-10-CM | POA: Diagnosis present

## 2023-12-05 DIAGNOSIS — L89616 Pressure-induced deep tissue damage of right heel: Secondary | ICD-10-CM | POA: Diagnosis not present

## 2023-12-05 DIAGNOSIS — R5381 Other malaise: Secondary | ICD-10-CM | POA: Diagnosis not present

## 2023-12-05 DIAGNOSIS — R6520 Severe sepsis without septic shock: Secondary | ICD-10-CM | POA: Diagnosis not present

## 2023-12-05 DIAGNOSIS — M1990 Unspecified osteoarthritis, unspecified site: Secondary | ICD-10-CM | POA: Diagnosis present

## 2023-12-05 DIAGNOSIS — J9601 Acute respiratory failure with hypoxia: Secondary | ICD-10-CM | POA: Diagnosis not present

## 2023-12-05 DIAGNOSIS — F419 Anxiety disorder, unspecified: Secondary | ICD-10-CM | POA: Diagnosis not present

## 2023-12-05 DIAGNOSIS — Z87891 Personal history of nicotine dependence: Secondary | ICD-10-CM

## 2023-12-05 DIAGNOSIS — R509 Fever, unspecified: Secondary | ICD-10-CM | POA: Diagnosis present

## 2023-12-05 DIAGNOSIS — T8149XA Infection following a procedure, other surgical site, initial encounter: Secondary | ICD-10-CM | POA: Diagnosis not present

## 2023-12-05 DIAGNOSIS — E872 Acidosis, unspecified: Secondary | ICD-10-CM | POA: Diagnosis present

## 2023-12-05 DIAGNOSIS — E875 Hyperkalemia: Secondary | ICD-10-CM | POA: Diagnosis present

## 2023-12-05 DIAGNOSIS — T8131XA Disruption of external operation (surgical) wound, not elsewhere classified, initial encounter: Secondary | ICD-10-CM | POA: Diagnosis not present

## 2023-12-05 DIAGNOSIS — E039 Hypothyroidism, unspecified: Secondary | ICD-10-CM | POA: Diagnosis present

## 2023-12-05 DIAGNOSIS — M549 Dorsalgia, unspecified: Principal | ICD-10-CM | POA: Insufficient documentation

## 2023-12-05 DIAGNOSIS — E1165 Type 2 diabetes mellitus with hyperglycemia: Secondary | ICD-10-CM | POA: Diagnosis present

## 2023-12-05 DIAGNOSIS — Z79899 Other long term (current) drug therapy: Secondary | ICD-10-CM

## 2023-12-05 DIAGNOSIS — Z7189 Other specified counseling: Secondary | ICD-10-CM | POA: Diagnosis not present

## 2023-12-05 DIAGNOSIS — M40209 Unspecified kyphosis, site unspecified: Secondary | ICD-10-CM | POA: Diagnosis present

## 2023-12-05 DIAGNOSIS — A499 Bacterial infection, unspecified: Secondary | ICD-10-CM

## 2023-12-05 DIAGNOSIS — M5440 Lumbago with sciatica, unspecified side: Secondary | ICD-10-CM | POA: Diagnosis not present

## 2023-12-05 DIAGNOSIS — N401 Enlarged prostate with lower urinary tract symptoms: Secondary | ICD-10-CM | POA: Diagnosis not present

## 2023-12-05 DIAGNOSIS — R739 Hyperglycemia, unspecified: Secondary | ICD-10-CM | POA: Diagnosis not present

## 2023-12-05 LAB — C-REACTIVE PROTEIN: CRP: 4.8 mg/dL — ABNORMAL HIGH (ref <1.0)

## 2023-12-05 LAB — COMPREHENSIVE METABOLIC PANEL WITH GFR
ALT: 15 U/L (ref 0–44)
AST: 31 U/L (ref 15–41)
Albumin: 3.2 g/dL — ABNORMAL LOW (ref 3.5–5.0)
Alkaline Phosphatase: 98 U/L (ref 38–126)
Anion gap: 18 — ABNORMAL HIGH (ref 5–15)
BUN: 11 mg/dL (ref 8–23)
CO2: 19 mmol/L — ABNORMAL LOW (ref 22–32)
Calcium: 9 mg/dL (ref 8.9–10.3)
Chloride: 94 mmol/L — ABNORMAL LOW (ref 98–111)
Creatinine, Ser: 1.57 mg/dL — ABNORMAL HIGH (ref 0.61–1.24)
GFR, Estimated: 46 mL/min — ABNORMAL LOW (ref >60)
Glucose, Bld: 121 mg/dL — ABNORMAL HIGH (ref 70–99)
Potassium: 4.2 mmol/L (ref 3.5–5.1)
Sodium: 131 mmol/L — ABNORMAL LOW (ref 135–145)
Total Bilirubin: 1.1 mg/dL (ref 0.0–1.2)
Total Protein: 6 g/dL — ABNORMAL LOW (ref 6.5–8.1)

## 2023-12-05 LAB — SEDIMENTATION RATE: Sed Rate: 38 mm/h — ABNORMAL HIGH (ref 0–16)

## 2023-12-05 MED ORDER — LEVOTHYROXINE SODIUM 112 MCG PO TABS
112.0000 ug | ORAL_TABLET | Freq: Every day | ORAL | Status: DC
  Administered 2023-12-06 – 2024-01-12 (×36): 112 ug via ORAL
  Filled 2023-12-05 (×38): qty 1

## 2023-12-05 MED ORDER — AMLODIPINE BESYLATE 10 MG PO TABS
10.0000 mg | ORAL_TABLET | Freq: Every day | ORAL | Status: DC
  Administered 2023-12-06 – 2023-12-12 (×6): 10 mg via ORAL
  Filled 2023-12-05 (×6): qty 1

## 2023-12-05 MED ORDER — SODIUM CHLORIDE 0.9 % IV SOLN: 250.0000 mL | INTRAVENOUS | Status: AC

## 2023-12-05 MED ORDER — OXYCODONE HCL ER 10 MG PO T12A
40.0000 mg | EXTENDED_RELEASE_TABLET | Freq: Two times a day (BID) | ORAL | Status: DC
  Administered 2023-12-05: 40 mg via ORAL
  Filled 2023-12-05: qty 4

## 2023-12-05 MED ORDER — TACROLIMUS 0.1 % EX OINT: 1.0000 | TOPICAL_OINTMENT | CUTANEOUS | Status: DC | PRN

## 2023-12-05 MED ORDER — ACETAMINOPHEN 650 MG RE SUPP: 650.0000 mg | RECTAL | Status: DC | PRN

## 2023-12-05 MED ORDER — ONDANSETRON HCL 4 MG PO TABS: 4.0000 mg | ORAL_TABLET | Freq: Four times a day (QID) | ORAL | Status: DC | PRN

## 2023-12-05 MED ORDER — SODIUM CHLORIDE 0.9% FLUSH: 3.0000 mL | INTRAVENOUS | Status: DC | PRN

## 2023-12-05 MED ORDER — ONDANSETRON HCL 4 MG/2ML IJ SOLN
4.0000 mg | Freq: Four times a day (QID) | INTRAMUSCULAR | Status: DC | PRN
  Administered 2023-12-08: 4 mg via INTRAVENOUS

## 2023-12-05 MED ORDER — FOLIC ACID 1 MG PO TABS
1.0000 mg | ORAL_TABLET | Freq: Every day | ORAL | Status: DC
  Administered 2023-12-06 – 2024-01-12 (×35): 1 mg via ORAL
  Filled 2023-12-05 (×35): qty 1

## 2023-12-05 MED ORDER — IRBESARTAN 75 MG PO TABS
37.5000 mg | ORAL_TABLET | Freq: Every day | ORAL | Status: DC
  Administered 2023-12-06 – 2023-12-15 (×9): 37.5 mg via ORAL
  Filled 2023-12-05 (×11): qty 0.5

## 2023-12-05 MED ORDER — HYDROCHLOROTHIAZIDE 25 MG PO TABS
25.0000 mg | ORAL_TABLET | Freq: Every day | ORAL | Status: DC
  Administered 2023-12-06 – 2023-12-15 (×9): 25 mg via ORAL
  Filled 2023-12-05 (×9): qty 1

## 2023-12-05 MED ORDER — SODIUM CHLORIDE 0.9% FLUSH
3.0000 mL | Freq: Two times a day (BID) | INTRAVENOUS | Status: DC
  Administered 2023-12-06 (×2): 3 mL via INTRAVENOUS

## 2023-12-05 MED ORDER — SODIUM CHLORIDE 0.9% FLUSH
3.0000 mL | Freq: Two times a day (BID) | INTRAVENOUS | Status: DC
  Administered 2023-12-06 (×2): 10 mL via INTRAVENOUS

## 2023-12-05 MED ORDER — ACETAMINOPHEN 325 MG PO TABS: 650.0000 mg | ORAL_TABLET | ORAL | Status: DC | PRN

## 2023-12-05 MED ORDER — VITAMIN B-12 1000 MCG PO TABS
1000.0000 ug | ORAL_TABLET | Freq: Every day | ORAL | Status: DC
Start: 2023-12-06 — End: 2024-01-17
  Administered 2023-12-06 – 2024-01-12 (×36): 1000 ug via ORAL
  Filled 2023-12-05 (×36): qty 1

## 2023-12-05 MED ORDER — MODAFINIL 200 MG PO TABS
200.0000 mg | ORAL_TABLET | Freq: Every day | ORAL | Status: DC
  Filled 2023-12-05: qty 1

## 2023-12-05 MED ORDER — SENNA 8.6 MG PO TABS
1.0000 | ORAL_TABLET | Freq: Two times a day (BID) | ORAL | Status: DC
  Administered 2023-12-05 – 2024-01-02 (×53): 8.6 mg via ORAL
  Filled 2023-12-05 (×54): qty 1

## 2023-12-05 MED ORDER — TAMSULOSIN HCL 0.4 MG PO CAPS
0.8000 mg | ORAL_CAPSULE | Freq: Every day | ORAL | Status: DC
  Administered 2023-12-06 – 2023-12-18 (×11): 0.8 mg via ORAL
  Filled 2023-12-05 (×11): qty 2

## 2023-12-05 MED ORDER — LEVOCETIRIZINE DIHYDROCHLORIDE 5 MG PO TABS: 5.0000 mg | ORAL_TABLET | Freq: Every day | ORAL | Status: DC

## 2023-12-05 MED ORDER — LORATADINE 10 MG PO TABS
10.0000 mg | ORAL_TABLET | Freq: Every day | ORAL | Status: DC
Start: 2023-12-06 — End: 2024-01-17
  Administered 2023-12-06 – 2024-01-12 (×36): 10 mg via ORAL
  Filled 2023-12-05 (×37): qty 1

## 2023-12-05 MED ORDER — OXYCODONE HCL 5 MG PO TABS
5.0000 mg | ORAL_TABLET | ORAL | Status: DC | PRN
  Administered 2023-12-05 – 2023-12-06 (×3): 5 mg via ORAL
  Filled 2023-12-05 (×3): qty 1

## 2023-12-05 MED ORDER — PANTOPRAZOLE SODIUM 40 MG PO TBEC
40.0000 mg | DELAYED_RELEASE_TABLET | Freq: Every day | ORAL | Status: DC
  Administered 2023-12-06 – 2024-01-12 (×35): 40 mg via ORAL
  Filled 2023-12-05 (×36): qty 1

## 2023-12-05 MED ORDER — VITAMIN C 500 MG PO TABS
1000.0000 mg | ORAL_TABLET | Freq: Every day | ORAL | Status: DC
  Administered 2023-12-06 – 2024-01-09 (×33): 1000 mg via ORAL
  Filled 2023-12-05 (×33): qty 2

## 2023-12-05 MED ORDER — ADULT MULTIVITAMIN W/MINERALS CH
1.0000 | ORAL_TABLET | Freq: Every day | ORAL | Status: DC
  Administered 2023-12-06 – 2023-12-07 (×2): 1 via ORAL
  Filled 2023-12-05 (×2): qty 1

## 2023-12-05 MED ORDER — ZINC SULFATE 220 (50 ZN) MG PO CAPS
220.0000 mg | ORAL_CAPSULE | Freq: Every day | ORAL | Status: DC
  Administered 2023-12-06 – 2024-01-09 (×33): 220 mg via ORAL
  Filled 2023-12-05 (×33): qty 1

## 2023-12-05 MED ORDER — FINASTERIDE 5 MG PO TABS
5.0000 mg | ORAL_TABLET | Freq: Every day | ORAL | Status: DC
  Administered 2023-12-06 – 2024-01-12 (×36): 5 mg via ORAL
  Filled 2023-12-05 (×36): qty 1

## 2023-12-05 MED ORDER — OMEPRAZOLE MAGNESIUM 20 MG PO TBEC: 20.0000 mg | DELAYED_RELEASE_TABLET | Freq: Every day | ORAL | Status: DC

## 2023-12-05 MED ORDER — PREDNISONE 5 MG PO TABS
15.0000 mg | ORAL_TABLET | Freq: Every day | ORAL | Status: DC
  Administered 2023-12-06: 15 mg via ORAL
  Filled 2023-12-05: qty 1

## 2023-12-05 NOTE — Plan of Care (Signed)

## 2023-12-05 NOTE — H&P (Signed)
 Subjective: Patient is a 73 y.o. male who complains of   Severe back pain and couple weeks after lumbar fusion surgery. Onset of symptoms was 4 days ago, gradually worsening since that time.  Onset was related to no known injury. The pain is rated severe, and is located at the across the lower back. The pain is described as aching and occurs all day. The symptoms has been progressive. Symptoms are exacerbated by exercise. The patient has tried  pain medications. MRI showed  no apparent complication but he came in the office today for evaluation and plain films show increasing kyphosis at L3-4 with retrolisthesis.   Past Medical History:  Diagnosis Date   Abnormal EKG    Arthritis    Essential hypertension 01/02/2017   GERD (gastroesophageal reflux disease)    Gout    Hyperlipidemia 01/02/2017   Hypertension    Hypothyroidism    Peripheral vascular disease (HCC)    RBBB 01/02/2017   Rotator cuff tear    Sleep apnea     Past Surgical History:  Procedure Laterality Date   APPENDECTOMY     CATARACT EXTRACTION Bilateral    EYE SURGERY     8 years ago   FEMORAL ENDARTERECTOMY Bilateral    done [facility] yrs. ago at [facility]   JOINT REPLACEMENT     LUMBAR LAMINECTOMY/DECOMPRESSION MICRODISCECTOMY Left 04/23/2020   Procedure: MICRO LUMBER DECOMPRESSION LUMBAR FOUR-SACRAL ONE ON LEFT AND FORAMINOTOMY LUMBAR FIVE LEFT;  Surgeon: Orvan Blanch, MD;  Location: [facility] OR;  Service: Orthopedics;  Laterality: Left;  posterior   REVERSE SHOULDER ARTHROPLASTY Right 12/16/2016   Procedure: RIGHT REVERSE TOTAL SHOULDER ARTHROPLASTY;  Surgeon: Winston Hawking, MD;  Location: [facility] OR;  Service: Orthopedics;  Laterality: Right;   SEPTOPLASTY     TONSILLECTOMY      Allergies  Allergen Reactions   Benzoic Acid Hives and Rash    Positive Patch Test 11/12/20   Imidurea Rash    Positive Patch Test 11/12/20   Urea  Rash    Positive Patch Test 11/12/20   Other Rash and Other (See Comments)    " DARK DYES " [ UNSPECIFIED DYE ]     Social History   Tobacco Use   Smoking status: Former    Current packs/day: 0.00    Average packs/day: 3.0 packs/day for 20.0 years (60.0 ttl pk-yrs)    Types: Cigarettes    Start date: 12/09/1966    Quit date: 12/09/1986    Years since quitting: 37.0   Smokeless tobacco: Never  Substance Use Topics   Alcohol use: Yes    Comment: social    History reviewed. No pertinent family history. Prior to Admission medications   Medication Sig Start Date End Date Taking? Authorizing Provider  amLODipine  (NORVASC ) 10 MG tablet Take 1 tablet by mouth daily. 04/20/23   [provider]  APPLE CIDER VINEGAR PO Take 1 capsule by mouth daily.    [provider]  Ascorbic Acid  (VITAMIN C ) 1000 MG tablet Take 1,000 mg by mouth daily.    [provider]  Cholecalciferol (VITAMIN D-3) 125 MCG (5000 UT) TABS Take 5,000 Units by mouth in the morning and at bedtime.    [provider]  docusate sodium  (COLACE) 100 MG capsule Take 1 capsule (100 mg total) by mouth 2 (two) times daily. Patient not taking: Reported on 11/03/2023 04/24/20   Bissell, Jaclyn M, PA-C  finasteride  (PROSCAR ) 5 MG tablet Take 5 mg by mouth daily.    [provider]  fluticasone (FLONASE) 50 MCG/ACT nasal spray  09/10/21   [provider]  folic acid  (FOLVITE ) 1 MG tablet Take 1 mg by mouth daily.    [provider]  hydrochlorothiazide  (HYDRODIURIL ) 25 MG tablet Take 25 mg by mouth daily.    [provider]  levocetirizine (XYZAL ) 5 MG tablet Take 5 mg by mouth at bedtime.     [provider]  levothyroxine  (SYNTHROID , LEVOTHROID) 112 MCG tablet Take 112 mcg by mouth daily before breakfast.    [provider]  methotrexate (RHEUMATREX) 2.5 MG tablet Take 15 mg by mouth every Saturday. 6 tablets once a week Patient not taking: Reported on 11/03/2023 03/17/20   [provider]  modafinil  (PROVIGIL ) 200 MG tablet Take 200 mg by mouth daily. In the  morning    [provider]  Multiple Vitamin (MULTIVITAMIN WITH MINERALS) TABS tablet Take 1 tablet by mouth daily. One A Day for Men 50+    [provider]  omeprazole  (PRILOSEC  OTC) 20 MG tablet Take 20 mg by mouth daily before breakfast.     [provider]  oxyCODONE  (OXY IR/ROXICODONE ) 5 MG immediate release tablet Take 1 tablet (5 mg total) by mouth every 4 (four) hours as needed for moderate pain ((score 4 to 6)). 04/24/20   Bissell, Jaclyn M, PA-C  oxyCODONE  (OXYCONTIN ) 40 mg 12 hr tablet Take 40 mg by mouth every 12 (twelve) hours.    [provider]  predniSONE  (DELTASONE ) 5 MG tablet Take 15 mg by mouth daily.    [provider]  Probiotic Product (PROBIOTIC COLON SUPPORT PO) Take 1 capsule by mouth daily in the afternoon. Garden of Life    [provider]  rosuvastatin  (CRESTOR ) 5 MG tablet Take 1 tablet by mouth daily. 04/20/23   [provider]  tacrolimus  (PROTOPIC ) 0.1 % ointment Apply 1 Application topically as needed (skin issues). 01/06/22   [provider]  tamsulosin  (FLOMAX ) 0.4 MG CAPS capsule Take 0.8 mg by mouth daily.    [provider]  triamcinolone  ointment (KENALOG ) 0.1 % Apply 1 application topically in the morning and at bedtime. 03/10/20   [provider]  valsartan (DIOVAN) 80 MG tablet Take 80 mg by mouth daily. 02/09/20   [provider]  vitamin B-12 (CYANOCOBALAMIN ) 1000 MCG tablet Take 1,000 mcg by mouth daily.    [provider]  Zinc  50 MG CAPS Take 50 mg by mouth daily. In the morning    [provider]     Review of Systems  Positive ROS: neg  All other systems have been reviewed and were otherwise negative with the exception of those mentioned in the HPI and as above.  Objective: Vital signs in last 24 hours:    General Appearance: Alert, cooperative, no distress, appears stated age Head: Normocephalic, without obvious abnormality,  atraumatic Eyes: PERRL, conjunctiva/corneas clear, EOM's intact, fundi benign, both eyes      Ears: Normal TM's and external ear canals, both ears Throat: Lips, mucosa, and tongue normal; teeth and gums normal Neck: Supple, symmetrical, trachea midline, no adenopathy; thyroid : No enlargement/tenderness/nodules; no carotid bruit or JVD Back: Symmetric, no curvature, ROM normal, no CVA tenderness Lungs: Clear to auscultation bilaterally, respirations unlabored Heart: Regular rate and rhythm, S1 and S2 normal, no murmur, rub or gallop Abdomen: Soft, non-tender, bowel sounds active all four quadrants, no masses, no organomegaly Extremities: Extremities normal, atraumatic, no cyanosis or edema Pulses: 2+ and symmetric all extremities Skin:  Skin color, texture, turgor normal, no rashes or lesions  NEUROLOGIC:   Mental status: alert and oriented, no aphasia, good attention span, Fund of knowledge/ memory ok Motor Exam - grossly normal Sensory Exam - grossly normal Reflexes:  Coordination - grossly normal Gait - grossly normal Balance - grossly normal Cranial Nerves: I: smell Not tested  II: visual acuity  OS: na    OD: ana  II: visual fields Full to confrontation  II: pupils Equal, round, reactive to light  III,VII: ptosis None  III,IV,VI: extraocular muscles  Full ROM  V: mastication Normal  V: facial light touch sensation  Normal  V,VII: corneal reflex  Present  VII: facial muscle function - upper  Normal  VII: facial muscle function - lower Normal  VIII: hearing Not tested  IX: soft palate elevation  Normal  IX,X: gag reflex Present  XI: trapezius strength  5/5  XI: sternocleidomastoid strength 5/5  XI: neck flexion strength  5/5  XII: tongue strength  Normal    Data Review Lab Results  Component Value Date   WBC 10.1 11/03/2023   HGB 12.5 (L) 11/03/2023   HCT 37.0 (L) 11/03/2023   MCV 95.9 11/03/2023   PLT 215 11/03/2023   Lab Results  Component Value Date   NA 134  (L) 11/03/2023   K 3.7 11/03/2023   CL 95 (L) 11/03/2023   CO2 25 11/03/2023   BUN 9 11/03/2023   CREATININE 0.77 11/03/2023   GLUCOSE 116 (H) 11/03/2023   Lab Results  Component Value Date   INR 1.0 11/03/2023    Assessment/Plan: Worsening back pain fairly quickly after L3-4 fusion.  Plain films show increasing kyphosis at the level.    Will admit for pain control and a CT scan of the lumbar spine and lab work.   Isadora Mar 12/05/2023 4:50 PM

## 2023-12-06 ENCOUNTER — Inpatient Hospital Stay (HOSPITAL_COMMUNITY)

## 2023-12-06 ENCOUNTER — Other Ambulatory Visit: Payer: Self-pay | Admitting: Neurological Surgery

## 2023-12-06 LAB — BASIC METABOLIC PANEL WITH GFR
Anion gap: 15 (ref 5–15)
BUN: 14 mg/dL (ref 8–23)
CO2: 25 mmol/L (ref 22–32)
Calcium: 8.6 mg/dL — ABNORMAL LOW (ref 8.9–10.3)
Chloride: 92 mmol/L — ABNORMAL LOW (ref 98–111)
Creatinine, Ser: 1.15 mg/dL (ref 0.61–1.24)
GFR, Estimated: >60 mL/min (ref >60)
Glucose, Bld: 108 mg/dL — ABNORMAL HIGH (ref 70–99)
Potassium: 3.9 mmol/L (ref 3.5–5.1)
Sodium: 132 mmol/L — ABNORMAL LOW (ref 135–145)

## 2023-12-06 MED ORDER — DIPHENHYDRAMINE HCL 50 MG/ML IJ SOLN: 12.5000 mg | Freq: Four times a day (QID) | INTRAMUSCULAR | Status: DC | PRN

## 2023-12-06 MED ORDER — MODAFINIL 100 MG PO TABS
200.0000 mg | ORAL_TABLET | Freq: Every day | ORAL | Status: DC
  Administered 2023-12-06 – 2024-01-12 (×36): 200 mg via ORAL
  Filled 2023-12-06 (×37): qty 2

## 2023-12-06 MED ORDER — HYDROMORPHONE 1 MG/ML IV SOLN
INTRAVENOUS | Status: DC
  Administered 2023-12-06: 1.5 mg via INTRAVENOUS
  Administered 2023-12-06: 30 mg via INTRAVENOUS
  Administered 2023-12-07: 0.9 mg via INTRAVENOUS
  Administered 2023-12-07: 3 mg via INTRAVENOUS
  Administered 2023-12-07: 1.8 mg via INTRAVENOUS
  Administered 2023-12-07: 1 mg via INTRAVENOUS
  Administered 2023-12-08: 0.1 mg via INTRAVENOUS
  Administered 2023-12-09: 3 mg via INTRAVENOUS
  Administered 2023-12-09: 30 mg via INTRAVENOUS
  Administered 2023-12-09: 2.8 mg via INTRAVENOUS
  Administered 2023-12-10: 1.2 mg via INTRAVENOUS
  Administered 2023-12-10: 2.1 mg via INTRAVENOUS
  Administered 2023-12-10: 0.3 mg via INTRAVENOUS
  Filled 2023-12-06 (×2): qty 30

## 2023-12-06 MED ORDER — SODIUM CHLORIDE 0.9% FLUSH: 9.0000 mL | INTRAVENOUS | Status: DC | PRN

## 2023-12-06 MED ORDER — OXYCODONE HCL 5 MG PO TABS
10.0000 mg | ORAL_TABLET | ORAL | Status: DC | PRN
  Administered 2023-12-06 – 2024-01-03 (×44): 10 mg via ORAL
  Filled 2023-12-06 (×46): qty 2

## 2023-12-06 MED ORDER — DEXAMETHASONE SODIUM PHOSPHATE 4 MG/ML IJ SOLN
4.0000 mg | Freq: Four times a day (QID) | INTRAMUSCULAR | Status: DC
  Administered 2023-12-06 – 2023-12-08 (×7): 4 mg via INTRAVENOUS
  Filled 2023-12-06 (×7): qty 1

## 2023-12-06 MED ORDER — NALOXONE HCL 0.4 MG/ML IJ SOLN: 0.4000 mg | INTRAMUSCULAR | Status: DC | PRN

## 2023-12-06 MED ORDER — ENSURE ENLIVE PO LIQD
237.0000 mL | Freq: Two times a day (BID) | ORAL | Status: DC
  Administered 2023-12-07 – 2024-01-12 (×51): 237 mL via ORAL

## 2023-12-06 MED ORDER — DIPHENHYDRAMINE HCL 12.5 MG/5ML PO ELIX: 12.5000 mg | ORAL_SOLUTION | Freq: Four times a day (QID) | ORAL | Status: DC | PRN

## 2023-12-06 NOTE — Plan of Care (Signed)
  Problem: Education: Goal: Knowledge of General Education information will improve Description: Including pain rating scale, medication(s)/side effects and non-pharmacologic comfort measures Outcome: Progressing   Problem: Health Behavior/Discharge Planning: Goal: Ability to manage health-related needs will improve Outcome: Progressing   Problem: Activity: Goal: Risk for activity intolerance will decrease Outcome: Progressing   Problem: Pain Managment: Goal: General experience of comfort will improve and/or be controlled Outcome: Progressing   Problem: Safety: Goal: Ability to remain free from injury will improve Outcome: Progressing

## 2023-12-06 NOTE — Progress Notes (Addendum)
 Subjective: Patient reports a lot of back pain and right leg pain   Objective: Vital signs in last 24 hours: Temp:  [97.7 F (36.5 C)-98.6 F (37 C)] 97.8 F (36.6 C) (04/23 0738) Pulse Rate:  [64-96] 68 (04/23 0738) Resp:  [16-19] 18 (04/23 0738) BP: (91-128)/(51-63) 112/63 (04/23 0738) SpO2:  [93 %-99 %] 99 % (04/23 0738)  Intake/Output from previous day: No intake/output data recorded. Intake/Output this shift: No intake/output data recorded.  Neurologic: Grossly normal  Lab Results: Lab Results  Component Value Date   WBC 10.1 11/03/2023   HGB 12.5 (L) 11/03/2023   HCT 37.0 (L) 11/03/2023   MCV 95.9 11/03/2023   PLT 215 11/03/2023   Lab Results  Component Value Date   INR 1.0 11/03/2023   BMET Lab Results  Component Value Date   NA 131 (L) 12/05/2023   K 4.2 12/05/2023   CL 94 (L) 12/05/2023   CO2 19 (L) 12/05/2023   GLUCOSE 121 (H) 12/05/2023   BUN 11 12/05/2023   CREATININE 1.57 (H) 12/05/2023   CALCIUM  9.0 12/05/2023    Studies/Results: CT LUMBAR SPINE WO CONTRAST Result Date: 12/06/2023 CLINICAL DATA:  Low back pain, prior surgery, new symptoms EXAM: CT LUMBAR SPINE WITHOUT CONTRAST TECHNIQUE: Multidetector CT imaging of the lumbar spine was performed without intravenous contrast administration. Multiplanar CT image reconstructions were also generated. RADIATION DOSE REDUCTION: This exam was performed according to the departmental dose-optimization program which includes automated exposure control, adjustment of the mA and/or kV according to patient size and/or use of iterative reconstruction technique. COMPARISON:  Lumbar spine radiographs 12/05/2023 FINDINGS: Segmentation: 5 lumbar type vertebrae. Alignment: Similar grade 1 retrolisthesis of L3 and grade 1 anterolisthesis of L5. No evidence of traumatic listhesis. Vertebrae: Nondisplaced fracture of the right lamina of L3. The fracture line extends to the right pedicle about the L3 pedicle screw (series  4/image 101; 8/51; 8/48). Nondisplaced fracture of the right superior endplate of L4 (series 9/image 38 and 10/109). Mildly displaced fracture of the left transverse process of L4 (series 8/image 46). Chronic left L5 pars defect. Paraspinal and other soft tissues: Aortic atherosclerotic calcification. Edema within the posterior paraspinal musculature. Disc levels: Multilevel spondylosis with bulky anterior osteophytes. Degenerative disc disease with vacuum phenomenon at L2-L3 and L4-L5 and L5-S1. Posterior fusion L3-L4 with interbody spacer. The interbody spacer appears impacted into the L4 vertebral body. Lucency about the L4 screw suggesting loosening. Streak artifact from the spinal hardware limits assessment of the spinal canal at L3-L4. At least moderate and possibly severe spinal canal narrowing at this level. Neural foraminal narrowing at L3-L4 is severe bilaterally. There is moderate bilateral neural foraminal narrowing at L4-L5. Severe neural foraminal narrowing on the left at L5-S1 and moderate on the right at L5-S1. IMPRESSION: 1. Multiple fractures as described. 2. Posterior fusion L3-L4 with interbody spacer. There signs of loosening about the L4 screws. 3. Advanced degenerative change in the lumbar spine with spinal canal and neural foraminal narrowing as described. Electronically Signed   By: Rozell Cornet M.D.   On: 12/06/2023 01:06    Assessment/Plan: CT lumbar reviewed which shows lucency around his screws, fracture through the right L3 pedicle and a superior endplate fracture of L4. Patient denies any recent falls. He was doing well the first week with at home therapy and then woke up with severe pain. We are tentatively planning to revise this on Friday with extension of his screws likely to L2 and S1. Will keep him NPO after  midnight tomorrow night. Will adjust his pain regimen to a dilaudid  PCA to help control his pain for now   LOS: 1 day    Norman Lambert 12/06/2023, 9:34  AM

## 2023-12-07 LAB — PROTIME-INR
INR: 1.1 (ref 0.8–1.2)
Prothrombin Time: 14.6 s (ref 11.4–15.2)

## 2023-12-07 NOTE — Progress Notes (Signed)
 PCA key unable to be returned to pyxis; unit Director notified and aware. Rns have been passing key shift to shift for PCA use. PCA key given to night shift RN Dorethia Ganser at shift report handoff.

## 2023-12-07 NOTE — Plan of Care (Signed)
 Pt rested during the shift. Was able to sleep. However she had some pain when not pushing PCA. His alert and oriented. Call light within reach will continue to monitor.  Problem: Education: Goal: Knowledge of General Education information will improve Description: Including pain rating scale, medication(s)/side effects and non-pharmacologic comfort measures Outcome: Progressing   Problem: Health Behavior/Discharge Planning: Goal: Ability to manage health-related needs will improve Outcome: Progressing   Problem: Clinical Measurements: Goal: Ability to maintain clinical measurements within normal limits will improve Outcome: Progressing Goal: Will remain free from infection Outcome: Progressing Goal: Diagnostic test results will improve Outcome: Progressing Goal: Respiratory complications will improve Outcome: Progressing Goal: Cardiovascular complication will be avoided Outcome: Progressing   Problem: Activity: Goal: Risk for activity intolerance will decrease Outcome: Progressing   Problem: Nutrition: Goal: Adequate nutrition will be maintained Outcome: Progressing   Problem: Coping: Goal: Level of anxiety will decrease Outcome: Progressing   Problem: Elimination: Goal: Will not experience complications related to bowel motility Outcome: Progressing Goal: Will not experience complications related to urinary retention Outcome: Progressing   Problem: Pain Managment: Goal: General experience of comfort will improve and/or be controlled Outcome: Progressing   Problem: Safety: Goal: Ability to remain free from injury will improve Outcome: Progressing   Problem: Skin Integrity: Goal: Risk for impaired skin integrity will decrease Outcome: Progressing

## 2023-12-07 NOTE — Care Management Important Message (Signed)
 Important Message  Patient Details  Name: Norman Lambert MRN: [id number] Date of Birth: 1951-07-25   Important Message Given:  Yes - Medicare IM     Felix Host 12/07/2023, 5:03 PM

## 2023-12-07 NOTE — Plan of Care (Signed)

## 2023-12-08 ENCOUNTER — Inpatient Hospital Stay (HOSPITAL_COMMUNITY): Payer: Self-pay

## 2023-12-08 ENCOUNTER — Inpatient Hospital Stay (HOSPITAL_COMMUNITY)

## 2023-12-08 ENCOUNTER — Encounter (HOSPITAL_COMMUNITY): Payer: Self-pay | Admitting: Neurological Surgery

## 2023-12-08 ENCOUNTER — Encounter (HOSPITAL_COMMUNITY): Admission: AD | Disposition: E | Payer: Self-pay | Source: Ambulatory Visit | Attending: Neurological Surgery

## 2023-12-08 ENCOUNTER — Other Ambulatory Visit: Payer: Self-pay

## 2023-12-08 DIAGNOSIS — S32039A Unspecified fracture of third lumbar vertebra, initial encounter for closed fracture: Secondary | ICD-10-CM

## 2023-12-08 DIAGNOSIS — T84296A Other mechanical complication of internal fixation device of vertebrae, initial encounter: Secondary | ICD-10-CM | POA: Diagnosis not present

## 2023-12-08 DIAGNOSIS — Z87891 Personal history of nicotine dependence: Secondary | ICD-10-CM

## 2023-12-08 DIAGNOSIS — I10 Essential (primary) hypertension: Secondary | ICD-10-CM | POA: Diagnosis not present

## 2023-12-08 HISTORY — PX: LAMINECTOMY WITH POSTERIOR LATERAL ARTHRODESIS LEVEL 4: SHX6338

## 2023-12-08 LAB — TYPE AND SCREEN
ABO/RH(D): O POS
Antibody Screen: NEGATIVE

## 2023-12-08 LAB — SURGICAL PCR SCREEN
MRSA, PCR: NEGATIVE
Staphylococcus aureus: NEGATIVE

## 2023-12-08 SURGERY — LAMINECTOMY WITH POSTERIOR LATERAL ARTHRODESIS LEVEL 4
Anesthesia: General | Site: Back

## 2023-12-08 MED ORDER — PROPOFOL 10 MG/ML IV BOLUS
INTRAVENOUS | Status: DC | PRN
  Administered 2023-12-08: 150 mg via INTRAVENOUS

## 2023-12-08 MED ORDER — POTASSIUM CHLORIDE IN NACL 20-0.9 MEQ/L-% IV SOLN
INTRAVENOUS | Status: AC
  Filled 2023-12-08 (×2): qty 1000

## 2023-12-08 MED ORDER — ONDANSETRON HCL 4 MG/2ML IJ SOLN: 4.0000 mg | Freq: Four times a day (QID) | INTRAMUSCULAR | Status: DC | PRN

## 2023-12-08 MED ORDER — SODIUM CHLORIDE 0.9% FLUSH
3.0000 mL | Freq: Two times a day (BID) | INTRAVENOUS | Status: DC
  Administered 2023-12-08 – 2024-01-09 (×52): 3 mL via INTRAVENOUS

## 2023-12-08 MED ORDER — PHENYLEPHRINE HCL-NACL 20-0.9 MG/250ML-% IV SOLN
INTRAVENOUS | Status: DC | PRN
  Administered 2023-12-08: 25 ug/min via INTRAVENOUS

## 2023-12-08 MED ORDER — VASHE WOUND IRRIGATION OPTIME
TOPICAL | Status: DC | PRN
  Administered 2023-12-08: 34 [oz_av] via TOPICAL

## 2023-12-08 MED ORDER — ROCURONIUM BROMIDE 10 MG/ML (PF) SYRINGE
PREFILLED_SYRINGE | INTRAVENOUS | Status: AC
  Filled 2023-12-08: qty 20

## 2023-12-08 MED ORDER — ACETAMINOPHEN 500 MG PO TABS
ORAL_TABLET | ORAL | Status: AC
  Administered 2023-12-08: 1000 mg via ORAL
  Filled 2023-12-08: qty 2

## 2023-12-08 MED ORDER — ROCURONIUM BROMIDE 10 MG/ML (PF) SYRINGE
PREFILLED_SYRINGE | INTRAVENOUS | Status: DC | PRN
  Administered 2023-12-08: 10 mg via INTRAVENOUS
  Administered 2023-12-08 (×2): 5 mg via INTRAVENOUS
  Administered 2023-12-08 (×2): 10 mg via INTRAVENOUS
  Administered 2023-12-08: 70 mg via INTRAVENOUS
  Administered 2023-12-08: 10 mg via INTRAVENOUS

## 2023-12-08 MED ORDER — DEXAMETHASONE SODIUM PHOSPHATE 4 MG/ML IJ SOLN
4.0000 mg | Freq: Four times a day (QID) | INTRAMUSCULAR | Status: DC
  Administered 2023-12-08 – 2023-12-09 (×3): 4 mg via INTRAVENOUS
  Filled 2023-12-08 (×3): qty 1

## 2023-12-08 MED ORDER — GABAPENTIN 300 MG PO CAPS
ORAL_CAPSULE | ORAL | Status: AC
  Administered 2023-12-08: 300 mg via ORAL
  Filled 2023-12-08: qty 1

## 2023-12-08 MED ORDER — PROPOFOL 10 MG/ML IV BOLUS
INTRAVENOUS | Status: AC
  Filled 2023-12-08: qty 20

## 2023-12-08 MED ORDER — 0.9 % SODIUM CHLORIDE (POUR BTL) OPTIME
TOPICAL | Status: DC | PRN
  Administered 2023-12-08 (×2): 1000 mL

## 2023-12-08 MED ORDER — BUPIVACAINE HCL (PF) 0.25 % IJ SOLN
INTRAMUSCULAR | Status: DC | PRN
  Administered 2023-12-08: 10 mL via INTRA_ARTICULAR

## 2023-12-08 MED ORDER — CHLORHEXIDINE GLUCONATE CLOTH 2 % EX PADS: 6.0000 | MEDICATED_PAD | Freq: Once | CUTANEOUS | Status: DC

## 2023-12-08 MED ORDER — CEFAZOLIN SODIUM-DEXTROSE 2-4 GM/100ML-% IV SOLN
2.0000 g | INTRAVENOUS | Status: AC
  Administered 2023-12-08: 2 g via INTRAVENOUS

## 2023-12-08 MED ORDER — FENTANYL CITRATE (PF) 250 MCG/5ML IJ SOLN
INTRAMUSCULAR | Status: AC
  Filled 2023-12-08: qty 5

## 2023-12-08 MED ORDER — ROCURONIUM BROMIDE 10 MG/ML (PF) SYRINGE
PREFILLED_SYRINGE | INTRAVENOUS | Status: AC
  Filled 2023-12-08: qty 10

## 2023-12-08 MED ORDER — THROMBIN 20000 UNITS EX SOLR
CUTANEOUS | Status: AC
  Filled 2023-12-08: qty 20000

## 2023-12-08 MED ORDER — EPHEDRINE 5 MG/ML INJ
INTRAVENOUS | Status: AC
  Filled 2023-12-08: qty 5

## 2023-12-08 MED ORDER — ALBUMIN HUMAN 5 % IV SOLN: INTRAVENOUS | Status: DC | PRN

## 2023-12-08 MED ORDER — ACETAMINOPHEN 500 MG PO TABS: 1000.0000 mg | ORAL_TABLET | Freq: Once | ORAL | Status: DC

## 2023-12-08 MED ORDER — SODIUM CHLORIDE 0.9 % IV SOLN: INTRAVENOUS | Status: DC | PRN

## 2023-12-08 MED ORDER — HYDROMORPHONE HCL 1 MG/ML IJ SOLN
INTRAMUSCULAR | Status: AC
  Filled 2023-12-08: qty 0.5

## 2023-12-08 MED ORDER — FENTANYL CITRATE (PF) 250 MCG/5ML IJ SOLN
INTRAMUSCULAR | Status: DC | PRN
  Administered 2023-12-08 (×2): 50 ug via INTRAVENOUS
  Administered 2023-12-08 (×2): 25 ug via INTRAVENOUS
  Administered 2023-12-08: 100 ug via INTRAVENOUS

## 2023-12-08 MED ORDER — BUPIVACAINE HCL (PF) 0.25 % IJ SOLN
INTRAMUSCULAR | Status: AC
  Filled 2023-12-08: qty 30

## 2023-12-08 MED ORDER — CEFAZOLIN SODIUM-DEXTROSE 2-4 GM/100ML-% IV SOLN
INTRAVENOUS | Status: AC
  Filled 2023-12-08: qty 100

## 2023-12-08 MED ORDER — ONDANSETRON HCL 4 MG/2ML IJ SOLN
INTRAMUSCULAR | Status: AC
  Filled 2023-12-08: qty 4

## 2023-12-08 MED ORDER — ACETAMINOPHEN 500 MG PO TABS: 1000.0000 mg | ORAL_TABLET | ORAL | Status: AC

## 2023-12-08 MED ORDER — CELECOXIB 200 MG PO CAPS
200.0000 mg | ORAL_CAPSULE | Freq: Two times a day (BID) | ORAL | Status: DC
  Administered 2023-12-08 – 2023-12-11 (×7): 200 mg via ORAL
  Filled 2023-12-08 (×7): qty 1

## 2023-12-08 MED ORDER — MENTHOL 3 MG MT LOZG: 1.0000 | LOZENGE | OROMUCOSAL | Status: DC | PRN

## 2023-12-08 MED ORDER — METHOCARBAMOL 500 MG PO TABS
500.0000 mg | ORAL_TABLET | Freq: Four times a day (QID) | ORAL | Status: DC | PRN
  Administered 2023-12-10 – 2023-12-24 (×14): 500 mg via ORAL
  Filled 2023-12-08 (×14): qty 1

## 2023-12-08 MED ORDER — PHENYLEPHRINE 80 MCG/ML (10ML) SYRINGE FOR IV PUSH (FOR BLOOD PRESSURE SUPPORT)
PREFILLED_SYRINGE | INTRAVENOUS | Status: DC | PRN
  Administered 2023-12-08: 40 ug via INTRAVENOUS

## 2023-12-08 MED ORDER — LIDOCAINE 2% (20 MG/ML) 5 ML SYRINGE
INTRAMUSCULAR | Status: AC
  Filled 2023-12-08: qty 10

## 2023-12-08 MED ORDER — DEXAMETHASONE SODIUM PHOSPHATE 10 MG/ML IJ SOLN
INTRAMUSCULAR | Status: DC | PRN
  Administered 2023-12-08: 10 mg via INTRAVENOUS

## 2023-12-08 MED ORDER — CHLORHEXIDINE GLUCONATE 0.12 % MT SOLN
OROMUCOSAL | Status: AC
  Administered 2023-12-08: 15 mL via OROMUCOSAL
  Filled 2023-12-08: qty 15

## 2023-12-08 MED ORDER — FENTANYL CITRATE (PF) 100 MCG/2ML IJ SOLN: 25.0000 ug | INTRAMUSCULAR | Status: DC | PRN

## 2023-12-08 MED ORDER — CEFAZOLIN SODIUM-DEXTROSE 2-4 GM/100ML-% IV SOLN
2.0000 g | Freq: Three times a day (TID) | INTRAVENOUS | Status: AC
  Administered 2023-12-08 – 2023-12-09 (×2): 2 g via INTRAVENOUS
  Filled 2023-12-08 (×2): qty 100

## 2023-12-08 MED ORDER — SODIUM CHLORIDE 0.9% FLUSH: 3.0000 mL | INTRAVENOUS | Status: DC | PRN

## 2023-12-08 MED ORDER — DIAZEPAM 5 MG/ML IJ SOLN
2.5000 mg | Freq: Once | INTRAMUSCULAR | Status: AC
  Administered 2023-12-08: 2.5 mg via INTRAVENOUS
  Filled 2023-12-08: qty 2

## 2023-12-08 MED ORDER — EPHEDRINE SULFATE-NACL 50-0.9 MG/10ML-% IV SOSY
PREFILLED_SYRINGE | INTRAVENOUS | Status: DC | PRN
  Administered 2023-12-08 (×2): 2.5 mg via INTRAVENOUS

## 2023-12-08 MED ORDER — HYDROMORPHONE HCL 1 MG/ML IJ SOLN
INTRAMUSCULAR | Status: DC | PRN
  Administered 2023-12-08 (×2): .1 mg via INTRAVENOUS

## 2023-12-08 MED ORDER — THROMBIN 5000 UNITS EX SOLR
OROMUCOSAL | Status: DC | PRN
  Administered 2023-12-08: 5 mL via TOPICAL

## 2023-12-08 MED ORDER — PHENOL 1.4 % MT LIQD: 1.0000 | OROMUCOSAL | Status: DC | PRN

## 2023-12-08 MED ORDER — METHOCARBAMOL 1000 MG/10ML IJ SOLN
500.0000 mg | Freq: Four times a day (QID) | INTRAMUSCULAR | Status: DC | PRN
  Administered 2023-12-08: 500 mg via INTRAVENOUS
  Filled 2023-12-08: qty 10

## 2023-12-08 MED ORDER — CELECOXIB 200 MG PO CAPS
200.0000 mg | ORAL_CAPSULE | Freq: Once | ORAL | Status: AC
  Administered 2023-12-08: 200 mg via ORAL
  Filled 2023-12-08: qty 1

## 2023-12-08 MED ORDER — DEXAMETHASONE SODIUM PHOSPHATE 10 MG/ML IJ SOLN
INTRAMUSCULAR | Status: AC
  Filled 2023-12-08: qty 2

## 2023-12-08 MED ORDER — DROPERIDOL 2.5 MG/ML IJ SOLN: 0.6250 mg | Freq: Once | INTRAMUSCULAR | Status: DC | PRN

## 2023-12-08 MED ORDER — THROMBIN 20000 UNITS EX SOLR
CUTANEOUS | Status: DC | PRN
  Administered 2023-12-08: 20 mL via TOPICAL

## 2023-12-08 MED ORDER — ORAL CARE MOUTH RINSE: 15.0000 mL | Freq: Once | OROMUCOSAL | Status: AC

## 2023-12-08 MED ORDER — ONDANSETRON HCL 4 MG PO TABS: 4.0000 mg | ORAL_TABLET | Freq: Four times a day (QID) | ORAL | Status: DC | PRN

## 2023-12-08 MED ORDER — DEXAMETHASONE 4 MG PO TABS
4.0000 mg | ORAL_TABLET | Freq: Four times a day (QID) | ORAL | Status: DC
  Administered 2023-12-09 – 2023-12-10 (×4): 4 mg via ORAL
  Filled 2023-12-08 (×6): qty 1

## 2023-12-08 MED ORDER — SODIUM CHLORIDE 0.9 % IV SOLN
250.0000 mL | INTRAVENOUS | Status: AC
  Administered 2023-12-08: 250 mL via INTRAVENOUS

## 2023-12-08 MED ORDER — KETAMINE HCL 50 MG/5ML IJ SOSY
PREFILLED_SYRINGE | INTRAMUSCULAR | Status: AC
  Filled 2023-12-08: qty 5

## 2023-12-08 MED ORDER — GABAPENTIN 300 MG PO CAPS: 300.0000 mg | ORAL_CAPSULE | ORAL | Status: AC

## 2023-12-08 MED ORDER — PHENYLEPHRINE 80 MCG/ML (10ML) SYRINGE FOR IV PUSH (FOR BLOOD PRESSURE SUPPORT)
PREFILLED_SYRINGE | INTRAVENOUS | Status: AC
  Filled 2023-12-08: qty 10

## 2023-12-08 MED ORDER — KETAMINE HCL 10 MG/ML IJ SOLN
INTRAMUSCULAR | Status: DC | PRN
  Administered 2023-12-08: 10 mg via INTRAVENOUS
  Administered 2023-12-08: 15 mg via INTRAVENOUS

## 2023-12-08 MED ORDER — CHLORHEXIDINE GLUCONATE 0.12 % MT SOLN: 15.0000 mL | Freq: Once | OROMUCOSAL | Status: AC

## 2023-12-08 MED ORDER — SUGAMMADEX SODIUM 200 MG/2ML IV SOLN
INTRAVENOUS | Status: DC | PRN
  Administered 2023-12-08: 200 mg via INTRAVENOUS

## 2023-12-08 MED ORDER — LACTATED RINGERS IV SOLN: INTRAVENOUS | Status: DC

## 2023-12-08 MED ORDER — LIDOCAINE 2% (20 MG/ML) 5 ML SYRINGE
INTRAMUSCULAR | Status: DC | PRN
  Administered 2023-12-08: 20 mg via INTRAVENOUS

## 2023-12-08 MED ORDER — THROMBIN 5000 UNITS EX KIT
PACK | CUTANEOUS | Status: AC
  Filled 2023-12-08: qty 1

## 2023-12-08 MED ORDER — ACETAMINOPHEN 500 MG PO TABS
1000.0000 mg | ORAL_TABLET | Freq: Four times a day (QID) | ORAL | Status: AC
  Administered 2023-12-08 – 2023-12-09 (×4): 1000 mg via ORAL
  Filled 2023-12-08 (×4): qty 2

## 2023-12-08 SURGICAL SUPPLY — 70 items
ALLOGRAFT BONE FIBER KORE 5 (Bone Implant) IMPLANT
BAG COUNTER SPONGE SURGICOUNT (BAG) ×2 IMPLANT
BASKET BONE COLLECTION (BASKET) IMPLANT
BENZOIN TINCTURE PRP APPL 2/3 (GAUZE/BANDAGES/DRESSINGS) ×2 IMPLANT
BLADE BONE MILL MEDIUM (MISCELLANEOUS) IMPLANT
BLADE CLIPPER SURG (BLADE) IMPLANT
BUR 14 MATCH 3 (BUR) IMPLANT
BUR CARBIDE MATCH 3.0 (BURR) ×2 IMPLANT
BUR MR8 14 BALL 5 (BUR) IMPLANT
CANISTER SUCT 3000ML PPV (MISCELLANEOUS) ×2 IMPLANT
CANNULA DELIVERY INVICT AL SU (CANNULA) IMPLANT
CEMENT KYPHON C01A KIT/MIXER (Cement) IMPLANT
CLEANSER WND VASHE 34 (WOUND CARE) ×2 IMPLANT
CLEANSER WND VASHE INSTL 34OZ (WOUND CARE) IMPLANT
CNTNR URN SCR LID CUP LEK RST (MISCELLANEOUS) ×2 IMPLANT
COVER BACK TABLE 60X90IN (DRAPES) ×2 IMPLANT
COVERAGE SUPPORT O-ARM STEALTH (MISCELLANEOUS) ×2 IMPLANT
DRAPE C-ARM 42X72 X-RAY (DRAPES) IMPLANT
DRAPE C-ARMOR (DRAPES) IMPLANT
DRAPE LAPAROTOMY 100X72X124 (DRAPES) ×2 IMPLANT
DRAPE SHEET LG 3/4 BI-LAMINATE (DRAPES) ×8 IMPLANT
DRAPE SURG 17X23 STRL (DRAPES) ×2 IMPLANT
DRESSING PEEL AND PLAC PRVNA20 (GAUZE/BANDAGES/DRESSINGS) IMPLANT
DURAPREP 26ML APPLICATOR (WOUND CARE) ×2 IMPLANT
ELECTRODE REM PT RTRN 9FT ADLT (ELECTROSURGICAL) ×2 IMPLANT
EVACUATOR 1/8 PVC DRAIN (DRAIN) IMPLANT
FEE COVERAGE SUPPORT O-ARM (MISCELLANEOUS) ×2 IMPLANT
GAUZE 4X4 16PLY ~~LOC~~+RFID DBL (SPONGE) IMPLANT
GLOVE BIO SURGEON STRL SZ7 (GLOVE) IMPLANT
GLOVE BIO SURGEON STRL SZ8 (GLOVE) ×6 IMPLANT
GLOVE BIOGEL PI IND STRL 7.0 (GLOVE) IMPLANT
GOWN STRL REUS W/ TWL LRG LVL3 (GOWN DISPOSABLE) IMPLANT
GOWN STRL REUS W/ TWL XL LVL3 (GOWN DISPOSABLE) ×4 IMPLANT
GOWN STRL REUS W/TWL 2XL LVL3 (GOWN DISPOSABLE) IMPLANT
GRAFT BN 10X1XDBM MAGNIFUSE (Bone Implant) IMPLANT
GRAFT BONE PROTEIOS XL 10CC (Orthopedic Implant) IMPLANT
HEMOSTAT POWDER KIT SURGIFOAM (HEMOSTASIS) ×2 IMPLANT
KIT BASIN OR (CUSTOM PROCEDURE TRAY) ×2 IMPLANT
KIT DRSG PREVENA PLUS 7DAY 125 (MISCELLANEOUS) IMPLANT
KIT TURNOVER KIT B (KITS) ×2 IMPLANT
MARKER SPHERE PSV REFLC NDI (MISCELLANEOUS) ×10 IMPLANT
MILL BONE PREP (MISCELLANEOUS) IMPLANT
NDL HYPO 25X1 1.5 SAFETY (NEEDLE) ×2 IMPLANT
NEEDLE HYPO 25X1 1.5 SAFETY (NEEDLE) ×2 IMPLANT
NS IRRIG 1000ML POUR BTL (IV SOLUTION) ×2 IMPLANT
PACK LAMINECTOMY NEURO (CUSTOM PROCEDURE TRAY) ×2 IMPLANT
PAD ARMBOARD POSITIONER FOAM (MISCELLANEOUS) ×6 IMPLANT
PATTIES SURGICAL .5 X.5 (GAUZE/BANDAGES/DRESSINGS) ×4 IMPLANT
PATTIES SURGICAL 1X1 (DISPOSABLE) ×4 IMPLANT
PLUNGER DELIVERY INVICT AL SU (ORTHOPEDIC DISPOSABLE SUPPLIES) IMPLANT
POWDER MORCELSS FINE 2000MG (Miscellaneous) IMPLANT
ROD LORD LIP INVICT 5.5X120 (Rod) IMPLANT
SCREW CANC SHANK MOD 5.5X45 (Screw) ×22 IMPLANT
SCREW CANC SHANK MOD 6.5X45 (Screw) IMPLANT
SCREW CANN FENS 7.5X45 (Screw) IMPLANT
SCREW CORT PA 7.5X45 (Screw) IMPLANT
SCREW CORT SHANK MOD 6.5X40 (Screw) IMPLANT
SCREW POLYAXIAL TULIP (Screw) IMPLANT
SET SCREW SPNE (Screw) IMPLANT
SPONGE SURGIFOAM ABS GEL 100 (HEMOSTASIS) ×2 IMPLANT
SPONGE T-LAP 4X18 ~~LOC~~+RFID (SPONGE) IMPLANT
STRIP CLOSURE SKIN 1/2X4 (GAUZE/BANDAGES/DRESSINGS) ×4 IMPLANT
SUT VIC AB 0 CT1 18XCR BRD8 (SUTURE) ×2 IMPLANT
SUT VIC AB 2-0 CP2 18 (SUTURE) ×2 IMPLANT
SUT VIC AB 3-0 SH 8-18 (SUTURE) ×4 IMPLANT
SYR 30ML SLIP (SYRINGE) ×4 IMPLANT
TOWEL GREEN STERILE (TOWEL DISPOSABLE) ×2 IMPLANT
TOWEL GREEN STERILE FF (TOWEL DISPOSABLE) ×2 IMPLANT
TRAY FOLEY MTR SLVR 16FR STAT (SET/KITS/TRAYS/PACK) IMPLANT
WATER STERILE IRR 1000ML POUR (IV SOLUTION) ×2 IMPLANT

## 2023-12-08 NOTE — Plan of Care (Signed)

## 2023-12-08 NOTE — Progress Notes (Signed)
 Received PCA key from [employer] RN

## 2023-12-08 NOTE — Progress Notes (Signed)
 Administerd Diazepam  2.5 mg/.5cc. Wasted 7.5 mg 1.5cc  Witness Loye Rumble LPN

## 2023-12-08 NOTE — Transfer of Care (Signed)
 Immediate Anesthesia Transfer of Care Note  Patient: Norman Lambert  Procedure(s) Performed: Lumbar Two - Sacral Two Revision - Extension of Fusion with O-Arm (Back) APPLICATION OF O-ARM  Patient Location: PACU  Anesthesia Type:General  Level of Consciousness: awake and alert   Airway & Oxygen Therapy: Patient Spontanous Breathing and Patient connected to face mask oxygen  Post-op Assessment: Report given to RN and Post -op Vital signs reviewed and stable  Post vital signs: Reviewed and stable  Last Vitals:  Vitals Value Taken Time  BP 104/78 12/08/23 1630  Temp    Pulse 77 12/08/23 1635  Resp 17 12/08/23 1635  SpO2 93 % 12/08/23 1635  Vitals shown include unfiled device data.  Last Pain:  Vitals:   12/08/23 1010  TempSrc:   PainSc: 8          Complications: No notable events documented.

## 2023-12-08 NOTE — Progress Notes (Signed)
 Late entry: Patient having trouble with pain control. On PCA dilaudid . Will plan for surgical stabilization on Friday.

## 2023-12-08 NOTE — H&P (Signed)
  Patient seen and examined.  Continues to complain of severe back and bilateral leg pain.  Plan is for open reduction internal fixation with extension of instrumentation above and below the previous fusion and fractures.  Will try to do L2-S1 but may need to go L1-S2.  This will be decided intraoperatively.  The patient understands this.  He understands the risk of the surgery include but are not limited to, bleeding, infection, CSF leak, nerve root injury, numbness, weakness, paralysis, pseudoarthrosis, misplaced instrumentation, need for further surgery, lack of relief of symptoms, worsening symptoms, vascular injury, blood transfusion, bowel bladder or sexual function, and anesthesia risk including DVT pneumonia MI and death.  He agrees to proceed

## 2023-12-08 NOTE — Op Note (Signed)
 12/08/2023  4:19 PM  PATIENT:  Norman Lambert  73 y.o. male  PRE-OPERATIVE DIAGNOSIS: Failed fusion with loosening of instrumentation, fracture of the L4 vertebral body and the L3 lamina  POST-OPERATIVE DIAGNOSIS:  same  PROCEDURE:   1.  Redo decompressive laminectomy L3-4 for recurrent stenosis with removal of epidural bone graft material 2. Posterior fixation L2-S1 inclusive using Alphatec cortical pedicle screws.  3. Intertransverse arthrodesis L2-S1 inclusive using morcellized autograft and allograft. 4.  Exploration of fusion L3-4 with removal of instrumentation  SURGEON:  Waymond Hailey, MD  ASSISTANTS: Jackee Marus, FNP  ANESTHESIA:  General  EBL: 800 ml  Total I/O In: 2550 [I.V.:1800; IV Piggyback:750] Out: 1850 [Urine:1050; Blood:800]  BLOOD ADMINISTERED:none  DRAINS: Medium Hemovac  INDICATION FOR PROCEDURE: This patient presented with severe back pain and leg pain 3 weeks after lumbar fusion surgery. Imaging revealed fracture of the L4 vertebral body and the L3 lamina and the left L4 transverse process with loosening of instrumentation and subsidence of the cages.  Patient denied falling or trauma .  We recommended lumbar reexploration with removal of the loosened instrumentation and extension of the fusion. patient understood the risks, benefits, and alternatives and potential outcomes and wished to proceed.  PROCEDURE DETAILS:  The patient was brought to the operating room. After induction of generalized endotracheal anesthesia the patient was rolled into the prone position on the Parmelee table and all pressure points were padded. The patient's lumbar region was cleaned and then prepped with DuraPrep and draped in the usual sterile fashion. Anesthesia was injected and then a dorsal midline incision was made and carried down to the lumbosacral fascia. The fascia was opened and the paraspinous musculature was taken down in a subperiosteal fashion to expose the old  hardware at L3-4 as well as L2-3 L4-5 and L5-S1.[name] A self-retaining retractor was placed. Intraoperative fluoroscopy confirmed my level, and I started with the removal of the old instrumentation.  We remove the locking caps and the rods.  The L4 screws were loose but the L3 screws appeared to have good purchase.  The L4 screws were removed.  We then decided to place our L2 pedicle screws.  The pedicle screw entry zones were localized utilizing surface landmarks and AP and lateral fluoroscopy.  We started at 5 and 7:00 on the pedicle face.  We tapped both L2 pedicles with a 5.5 tap and then placed 5.5 x 45 mm pedicle screws into the L2 pedicles bilaterally.  Placed the right L4 pedicle screw with a 7.5 x 45 mm pedicle screw.  There was significant stenosis at L3-4 and the spinous process was deviated and loose.  We remove the spinous process and then spent considerable time removing epidural bone graft material in the epidural space.  Drilled more the lamina and found the executing L3 and L4's nerves.  Palpated with a nerve hook.  The central canal appeared to be well decompressed.  We then turned our attention to the placement of the lower pedicle screws. The pedicle screw entry zones were identified utilizing surface landmarks and AP and lateral fluoroscopy. I drilled into each pedicle utilizing the hand drill, and tapped each pedicle with the appropriate tap. We palpated with a ball probe to assure no break in the cortex. We then placed 6.5 x 45 mm pedicle screws at L5 and 7.5 x 45 mm pedicle screws into the pedicles bilaterally at S1.  My nurse practitioner assisted in placement of the pedicle screws.  At S1 we  used fenestrated screws and placed 1-1/2 cc of methylmethacrylate through the screws while watching on lateral fluoroscopy.  The methylmethacrylate on the right appeared to be going down the sacral wall and we felt like it was contained within the sacrum on fluoroscopy.  On the left there is a new  small linear hyperdensity contained within the disc space that had no obvious connection to the pedicle screw.  Therefore we decided to do a spin with the O-arm.  This did show that the methylmethacrylate on the right was anterior to the sacrum but small and away from the vasculature.  On the left there was a small hyperdensity within the disc space but again was not confluent with the pedicle screw.  Did not see epidural methylmethacrylate.  The left L5 screw broke out lateral to the pedicle.  We did not feel that revision of the screw would be useful and felt that we would lose our purchase within the pedicle and felt leaving it was the best option.  The screw felt good in the pedicle and felt tight.  Did not want to lose purchase at L5.   We then decorticated the transverse processes and laid a mixture of morcellized autograft and allograft out over these to perform intertransverse arthrodesis at L2-S1 bilaterally. We then placed lordotic rods into the multiaxial screw heads of the pedicle screws and locked these in position with the locking caps and anti-torque device. Irrigated with copious amounts of Vashe solution followed by saline solution.  Placed a medium Hemovac drain through a separate stab incision.  Inspected the nerve roots once again to assure adequate decompression, lined to the dura with Gelfoam,  and then we closed the muscle and the fascia with 0 Vicryl. Closed the subcutaneous tissues with 2-0 Vicryl and subcuticular tissues with 3-0 Vicryl.  We then placed a negative pressure external wound dressing.  The patient was awakened from general anesthesia and transported to the recovery room in stable condition. At the end of the procedure all sponge, needle and instrument counts were correct.   PLAN OF CARE: admit to inpatient  PATIENT DISPOSITION:  PACU - hemodynamically stable.   Delay start of Pharmacological VTE agent (>24hrs) due to surgical blood loss or risk of bleeding:  yes

## 2023-12-08 NOTE — Anesthesia Preprocedure Evaluation (Signed)
 Anesthesia Evaluation  Patient identified by MRN, date of birth, ID band Patient awake    Reviewed: Allergy  & Precautions, NPO status , Patient's Chart, lab work & pertinent test results  Airway Mallampati: II  TM Distance: >3 FB     Dental no notable dental hx.    Pulmonary sleep apnea , Patient abstained from smoking., former smoker   Pulmonary exam normal        Cardiovascular hypertension, Pt. on medications + Peripheral Vascular Disease   Rhythm:Regular Rate:Normal     Neuro/Psych negative neurological ROS  negative psych ROS   GI/Hepatic Neg liver ROS,GERD  Medicated,,  Endo/Other  Hypothyroidism    Renal/GU negative Renal ROS  negative genitourinary   Musculoskeletal  (+) Arthritis , Osteoarthritis,    Abdominal Normal abdominal exam  (+)   Peds  Hematology Lab Results      Component                Value               Date                      WBC                      10.1                11/03/2023                HGB                      12.5 (L)            11/03/2023                HCT                      37.0 (L)            11/03/2023                MCV                      95.9                11/03/2023                PLT                      215                 11/03/2023              Anesthesia Other Findings   Reproductive/Obstetrics                             Anesthesia Physical Anesthesia Plan  ASA: 3  Anesthesia Plan: General   Post-op Pain Management: Celebrex  PO (pre-op)* and Tylenol  PO (pre-op)*   Induction: Intravenous  PONV Risk Score and Plan: 2 and Ondansetron , Dexamethasone  and Treatment may vary due to age or medical condition  Airway Management Planned: Mask, Oral ETT and Video Laryngoscope Planned  Additional Equipment: None  Intra-op Plan:   Post-operative Plan: Extubation in OR  Informed Consent: I have reviewed the patients History and  Physical, chart, labs and discussed the procedure including the risks, benefits and alternatives for the proposed  anesthesia with the patient or authorized representative who has indicated his/her understanding and acceptance.     Dental advisory given  Plan Discussed with: CRNA  Anesthesia Plan Comments:        Anesthesia Quick Evaluation

## 2023-12-08 NOTE — Progress Notes (Signed)
 Received Norman Lambert to floor 1720 Patient complaining of 10/10 pain.  Robaxin  and decadron  IV administered.  Patient continues to complain of 10/10 back lower pain. Notified Dr. Waymond Hailey. Placed new orders.

## 2023-12-08 NOTE — Anesthesia Procedure Notes (Cosign Needed)
 Procedure Name: Intubation Date/Time: 12/08/2023 10:50 AM  Performed by: Micheal Agent, DOPre-anesthesia Checklist: Patient identified, Emergency Drugs available, Suction available and Patient being monitored Patient Re-evaluated:Patient Re-evaluated prior to induction Oxygen Delivery Method: Circle system utilized Preoxygenation: Pre-oxygenation with 100% oxygen Induction Type: IV induction Ventilation: Mask ventilation without difficulty Laryngoscope Size: Mac and 3 Grade View: Grade I Tube type: Oral Tube size: 7.5 mm Number of attempts: 1 Airway Equipment and Method: Stylet and Oral airway Placement Confirmation: ETT inserted through vocal cords under direct vision, positive ETCO2 and breath sounds checked- equal and bilateral Secured at: 23 cm Tube secured with: Tape Dental Injury: Teeth and Oropharynx as per pre-operative assessment

## 2023-12-09 LAB — BASIC METABOLIC PANEL WITH GFR
Anion gap: 11 (ref 5–15)
BUN: 22 mg/dL (ref 8–23)
CO2: 25 mmol/L (ref 22–32)
Calcium: 7.9 mg/dL — ABNORMAL LOW (ref 8.9–10.3)
Chloride: 100 mmol/L (ref 98–111)
Creatinine, Ser: 0.86 mg/dL (ref 0.61–1.24)
GFR, Estimated: >60 mL/min (ref >60)
Glucose, Bld: 128 mg/dL — ABNORMAL HIGH (ref 70–99)
Potassium: 3.8 mmol/L (ref 3.5–5.1)
Sodium: 136 mmol/L (ref 135–145)

## 2023-12-09 LAB — CBC
HCT: 26.2 % — ABNORMAL LOW (ref 39.0–52.0)
Hemoglobin: 8.6 g/dL — ABNORMAL LOW (ref 13.0–17.0)
MCH: 30.8 pg (ref 26.0–34.0)
MCHC: 32.8 g/dL (ref 30.0–36.0)
MCV: 93.9 fL (ref 80.0–100.0)
Platelets: 196 10*3/uL (ref 150–400)
RBC: 2.79 MIL/uL — ABNORMAL LOW (ref 4.22–5.81)
RDW: 12.6 % (ref 11.5–15.5)
WBC: 14.5 10*3/uL — ABNORMAL HIGH (ref 4.0–10.5)
nRBC: 0 % (ref 0.0–0.2)

## 2023-12-09 LAB — POCT I-STAT EG7
Acid-Base Excess: 2 mmol/L (ref 0.0–2.0)
Bicarbonate: 28 mmol/L (ref 20.0–28.0)
Calcium, Ion: 1.13 mmol/L — ABNORMAL LOW (ref 1.15–1.40)
HCT: 31 % — ABNORMAL LOW (ref 39.0–52.0)
Hemoglobin: 10.5 g/dL — ABNORMAL LOW (ref 13.0–17.0)
O2 Saturation: 97 %
Patient temperature: 36.9
Potassium: 3.6 mmol/L (ref 3.5–5.1)
Sodium: 134 mmol/L — ABNORMAL LOW (ref 135–145)
TCO2: 29 mmol/L (ref 22–32)
pCO2, Ven: 48.1 mmHg (ref 44–60)
pH, Ven: 7.372 (ref 7.25–7.43)
pO2, Ven: 96 mmHg — ABNORMAL HIGH (ref 32–45)

## 2023-12-09 LAB — POCT ACTIVATED CLOTTING TIME: Activated Clotting Time: 0 s

## 2023-12-09 NOTE — Progress Notes (Signed)
    Providing Compassionate, Quality Care - Together   NEUROSURGERY PROGRESS NOTE     S: No issues overnight.    O: EXAM:  BP (!) 109/51   Pulse 68   Temp 98.2 F (36.8 C) (Oral)   Resp 13   Ht 6' (1.829 m)   Wt 101.6 kg   SpO2 95%   BMI 30.38 kg/m     Awake, alert, oriented  Speech fluent, appropriate  L drop foot, chronic Strength grossly intact otherwise Wound vac in place    ASSESSMENT:  73 y.o. s/p PSF L2-S1    PLAN: -Continue supportive care -Therapies as tolerated -Likely dc hemovac tmrw -Likely ok for dvt chemoppx tmrw -Call w/ questions/concerns   Easter Golden, PAC

## 2023-12-09 NOTE — Progress Notes (Signed)
 Patient has not voided since foley was removed overnight. Bladder scan volume of 84mL. PA Tomlimson made aware.  1649: Voided . PA updated.

## 2023-12-09 NOTE — Progress Notes (Signed)
 PT Cancellation Note  Patient Details Name: Norman Lambert MRN: [id number] DOB: 1951-06-22   Cancelled Treatment:    Reason Eval/Treat Not Completed: Medical issues which prohibited therapy (Pt unable to work with PT today as he had 10/10 pain when sitting up with OT and was shaking per OT as well as drainage from incision. Pt not agreeable to work with PT this pm.  Will evaluate pt tomorrow.)   Nahlia Hellmann F Anayia Eugene 12/09/2023, 1:40 PM Rishard Delange M,PT Acute Rehab Services [phone]

## 2023-12-09 NOTE — Plan of Care (Signed)

## 2023-12-09 NOTE — Anesthesia Postprocedure Evaluation (Signed)
 Anesthesia Post Note  Patient: Norman Lambert  Procedure(s) Performed: Lumbar Two - Sacral Two Revision - Extension of Fusion with O-Arm (Back) APPLICATION OF O-ARM     Patient location during evaluation: PACU Anesthesia Type: General Level of consciousness: awake and alert Pain management: pain level controlled Vital Signs Assessment: post-procedure vital signs reviewed and stable Respiratory status: spontaneous breathing, nonlabored ventilation, respiratory function stable and patient connected to nasal cannula oxygen Cardiovascular status: blood pressure returned to baseline and stable Postop Assessment: no apparent nausea or vomiting Anesthetic complications: no   No notable events documented.  Last Vitals:  Vitals:   12/09/23 0745 12/09/23 0800  BP: (!) 109/51   Pulse: 68   Resp: 16 13  Temp: 36.8 C   SpO2: 95%     Last Pain:  Vitals:   12/09/23 0824  TempSrc:   PainSc: 6                  Anaysha Andre P Corleen Otwell

## 2023-12-09 NOTE — Evaluation (Signed)
 Occupational Therapy Evaluation Patient Details Name: Norman Lambert MRN: [id number] DOB: [birth year] Today's Date: 12/09/2023   History of Present Illness   Pt is a 73 yr old male who presented 12/05/23 due to complaints of Ble pain and sever back pain. Pt s/p 12/08/23 redo of laminectomy L3-4 with posterior fixation L2-S1. PMH gout, arthritis, HTN, HLD, HTN, PVD, RBBB, R reverse shoulder, sleep apnea, 11/13/23 PLIF L3-4     Clinical Impressions Pt at this time presented in bed with family present and agreed to session. At this time was able to complete supine to sit with max assist and then was able to sit on EOB but then required moderate assist as pt had posterior lean. He was able to change gown with min assist and then started to shake with increase in pain and requested to go back down into bed and required max x2. Patient will benefit from continued inpatient follow up therapy, <3 hours/day.     If plan is discharge home, recommend the following:   Two people to help with walking and/or transfers;Two people to help with bathing/dressing/bathroom     Functional Status Assessment   Patient has had a recent decline in their functional status and demonstrates the ability to make significant improvements in function in a reasonable and predictable amount of time.     Equipment Recommendations   Other (comment) (TBD)     Recommendations for Other Services         Precautions/Restrictions   Precautions Precautions: Back Precaution Booklet Issued: Yes (comment) Recall of Precautions/Restrictions: Impaired Precaution/Restrictions Comments: Pt noted family to cue pt on twisting Required Braces or Orthoses: Spinal Brace Spinal Brace: Lumbar corset Other Brace: AFO for LLLE Restrictions Weight Bearing Restrictions Per Provider Order: No     Mobility Bed Mobility Overal bed mobility: Needs Assistance Bed Mobility: Supine to Sit, Sit to Supine Rolling: Min  assist Sidelying to sit: Max assist, Used rails, HOB elevated Supine to sit: Max assist, HOB elevated, Used rails Sit to supine: Max assist, +2 for physical assistance, +2 for safety/equipment, HOB elevated, Used rails   General bed mobility comments: total x2 to get to [facility] for repositioning    Transfers                   General transfer comment: deffered due to pain      Balance Overall balance assessment: Needs assistance Sitting-balance support: Feet supported, Bilateral upper extremity supported Sitting balance-Leahy Scale: Poor Sitting balance - Comments: posterior lean and unable to let go of BUE support                                   ADL either performed or assessed with clinical judgement   ADL Overall ADL's : Needs assistance/impaired Eating/Feeding: Set up;Sitting   Grooming: Wash/dry hands;Wash/dry face;Set up;Cueing for safety;Cueing for sequencing;Sitting   Upper Body Bathing: Minimal assistance;Sitting   Lower Body Bathing: Total assistance;Bed level   Upper Body Dressing : Minimal assistance;Bed level   Lower Body Dressing: Total assistance;Bed level                       Vision Baseline Vision/History: 0 No visual deficits Ability to See in Adequate Light: 0 Adequate Patient Visual Report: No change from baseline Vision Assessment?: No apparent visual deficits     Perception Perception: Within Functional Limits  Praxis Praxis: WFL       Pertinent Vitals/Pain Pain Assessment Pain Assessment: 0-10 Pain Score: 7  Pain Location: sx sitw Pain Descriptors / Indicators: Operative site guarding Pain Intervention(s): Limited activity within patient's tolerance, Monitored during session, Premedicated before session, Repositioned     Extremity/Trunk Assessment Upper Extremity Assessment Upper Extremity Assessment: Overall WFL for tasks assessed   Lower Extremity Assessment Lower Extremity Assessment: Defer to  PT evaluation   Cervical / Trunk Assessment Cervical / Trunk Assessment: Back Surgery   Communication Communication Communication: No apparent difficulties   Cognition Arousal: Alert Behavior During Therapy: WFL for tasks assessed/performed Cognition: No apparent impairments                               Following commands: Intact       Cueing  General Comments   Cueing Techniques: Verbal cues      Exercises     Shoulder Instructions      Home Living Family/patient expects to be discharged to:: Private residence Living Arrangements: Spouse/significant other Available Help at Discharge: Family;Available 24 hours/day Type of Home: House Home Access: Level entry     Home Layout: One level     Bathroom Shower/Tub: Producer, television/film/video: Handicapped height     Home Equipment: Agricultural consultant (2 wheels);Shower seat;BSC/3in1;Cane - single point          Prior Functioning/Environment Prior Level of Function : Needs assist             Mobility Comments: Pt reported they were ambulating through the home and was able to get up to bathroom in night.      OT Problem List: Impaired balance (sitting and/or standing);Decreased activity tolerance;Decreased safety awareness;Decreased knowledge of use of DME or AE;Decreased knowledge of precautions;Pain   OT Treatment/Interventions: Self-care/ADL training;DME and/or AE instruction;Therapeutic activities;Patient/family education;Balance training      OT Goals(Current goals can be found in the care plan section)   Acute Rehab OT Goals Patient Stated Goal: to get better OT Goal Formulation: With patient Time For Goal Achievement: 12/23/23 Potential to Achieve Goals: Good   OT Frequency:  Min 2X/week    Co-evaluation              AM-PAC OT "6 Clicks" Daily Activity     Outcome Measure Help from another person eating meals?: A Little Help from another person taking care of  personal grooming?: A Little Help from another person toileting, which includes using toliet, bedpan, or urinal?: Total Help from another person bathing (including washing, rinsing, drying)?: A Lot Help from another person to put on and taking off regular upper body clothing?: A Little Help from another person to put on and taking off regular lower body clothing?: A Lot 6 Click Score: 14   End of Session Equipment Utilized During Treatment: Gait belt;Rolling walker (2 wheels) Nurse Communication: Mobility status  Activity Tolerance: Patient limited by fatigue;No increased pain Patient left: in bed;with call bell/phone within reach;with bed alarm set  OT Visit Diagnosis: Unsteadiness on feet (R26.81);Muscle weakness (generalized) (M62.81);Pain Pain - Right/Left:  (back)                Time: 2130-8657 OT Time Calculation (min): 35 min Charges:  OT General Charges $OT Visit: 1 Visit OT Evaluation $OT Eval Moderate Complexity: 1 Mod OT Treatments $Self Care/Home Management : 8-22 mins  [address] OTR/L  Acute Rehab Services  [phone] office number   Stevphen Elders 12/09/2023, 12:23 PM

## 2023-12-09 NOTE — Progress Notes (Signed)
 4mL of dilaudid  PCA wasted with Zahra J. Consulting civil engineer.

## 2023-12-10 LAB — GLUCOSE, CAPILLARY: Glucose-Capillary: 137 mg/dL — ABNORMAL HIGH (ref 70–99)

## 2023-12-10 MED ORDER — HEPARIN SODIUM (PORCINE) 5000 UNIT/ML IJ SOLN: 5000.0000 [IU] | Freq: Three times a day (TID) | INTRAMUSCULAR | Status: DC

## 2023-12-10 NOTE — Progress Notes (Addendum)
    Providing Compassionate, Quality Care - Together   NEUROSURGERY PROGRESS NOTE     S: No issues overnight.    O: EXAM:  BP (!) 128/52 (BP Location: Right Arm)   Pulse 67   Temp 98.5 F (36.9 C) (Oral)   Resp 20   Ht 6' (1.829 m)   Wt 101.6 kg   SpO2 96%   BMI 30.38 kg/m     Awake, alert, oriented  Speech fluent, appropriate  L drop foot, chronic Strength grossly intact otherwise Wound vac in place Hemovac in place   ASSESSMENT:  73 y.o. s/p PSF L2-S1     PLAN: -Continue supportive care -Therapies as tolerated -Continue hemovac given output -Call w/ questions/concerns   Easter Golden, PAC

## 2023-12-10 NOTE — Evaluation (Signed)
 Physical Therapy Evaluation Patient Details Name: Norman Lambert MRN: [id number] DOB: May 18, 1951 Today's Date: 12/10/2023  History of Present Illness  Pt is a 73 yr old male who presented 12/05/23 due to complaints of Ble pain and sever back pain. Pt s/p 12/08/23 redo of laminectomy L3-4 with posterior fixation L2-S1. PMH gout, arthritis, HTN, HLD, HTN, PVD, RBBB, R reverse shoulder, sleep apnea, 11/13/23 PLIF L3-4  Clinical Impression   Pt admitted with above diagnosis. Lives at home home with wife, in a single-level home with a level entrance; Prior to admission, pt was able to manage walking household distance; Presents to PT with generalized weakness, decr activity tolerance, fucntional dependencies, and a decreased functional level compared to baseline;  He is able to log roll with bedrails and minimal assist; mod assist to push up sidelying to sit, and stands with 2 person heavy mod assist; he was able to take sidesteps towards [facility], and then pivot steps to recliner with a RW and 2 person assist for safety; Good progress compared to yesterday's OT eval; hopeful for more progress and the ability to get home; still, at this point we must consider a post-acute rehab stay to maximize independence and safety with mobility and ADLs; Pt currently with functional limitations due to the deficits listed below (see PT Problem List). Pt will benefit from skilled PT to increase their independence and safety with mobility to allow discharge to the venue listed below.           If plan is discharge home, recommend the following: A lot of help with walking and/or transfers;A lot of help with bathing/dressing/bathroom   Can travel by private vehicle   No    Equipment Recommendations Rolling walker (2 wheels);BSC/3in1  Recommendations for Other Services       Functional Status Assessment Patient has had a recent decline in their functional status and demonstrates the ability to make significant improvements in  function in a reasonable and predictable amount of time.     Precautions / Restrictions Precautions Precautions: Back Precaution Booklet Issued: Yes (comment) Recall of Precautions/Restrictions: Impaired Precaution/Restrictions Comments: Pt noted family to cue pt on twisting Required Braces or Orthoses: Spinal Brace Spinal Brace: Lumbar corset Other Brace: AFO for LLLE Restrictions Weight Bearing Restrictions Per Provider Order: No      Mobility  Bed Mobility Overal bed mobility: Needs Assistance Bed Mobility: Supine to Sit, Sit to Supine Rolling: Min assist Sidelying to sit: Used rails, HOB elevated, Mod assist       General bed mobility comments: Good log roll and heavy mod assist to push up to sit; very nice effort    Transfers Overall transfer level: Needs assistance Equipment used: Rolling walker (2 wheels) Transfers: Sit to/from Stand, Bed to chair/wheelchair/BSC Sit to Stand: +2 physical assistance, Mod assist   Step pivot transfers: Min assist, +2 physical assistance       General transfer comment: 2 person mod assist with bil support to power up to stand; took small sidestep along EOB with RW, guarded, but successful steps, and tehn able to take pivot steps to standing in front of recliner    Ambulation/Gait               General Gait Details: Hopefully will get to walk next session  Stairs            Wheelchair Mobility     Tilt Bed    Modified Rankin (Stroke Patients Only)  Balance Overall balance assessment: Needs assistance Sitting-balance support: Feet supported, Bilateral upper extremity supported Sitting balance-Leahy Scale: Poor Sitting balance - Comments: posterior lean and unable to let go of BUE support     Standing balance-Leahy Scale: Poor                               Pertinent Vitals/Pain Pain Assessment Pain Assessment: 0-10 Pain Score: 5  Pain Location: sx site Pain Descriptors / Indicators:  Operative site guarding Pain Intervention(s): Monitored during session, Premedicated before session    Home Living Family/patient expects to be discharged to:: Private residence Living Arrangements: Spouse/significant other Available Help at Discharge: Family;Available 24 hours/day Type of Home: House Home Access: Level entry       Home Layout: One level Home Equipment: Agricultural consultant (2 wheels);Shower seat;BSC/3in1;Cane - single point      Prior Function Prior Level of Function : Needs assist             Mobility Comments: Pt reported they were ambulating through the home and was able to get up to bathroom in night.       Extremity/Trunk Assessment   Upper Extremity Assessment Upper Extremity Assessment: Defer to OT evaluation    Lower Extremity Assessment Lower Extremity Assessment: Generalized weakness;LLE deficits/detail LLE Deficits / Details: history of foot drop; wife brought in AFO    Cervical / Trunk Assessment Cervical / Trunk Assessment: Back Surgery  Communication   Communication Communication: No apparent difficulties    Cognition Arousal: Alert Behavior During Therapy: WFL for tasks assessed/performed   PT - Cognitive impairments: Memory                       PT - Cognition Comments: needed reviewing of back precautions 30 mins after he had learned them. Wife remembered though Following commands: Intact       Cueing Cueing Techniques: Verbal cues     General Comments General comments (skin integrity, edema, etc.): VSS on room air; wife present and helpful    Exercises     Assessment/Plan    PT Assessment Patient needs continued PT services  PT Problem List Decreased strength;Decreased range of motion;Decreased activity tolerance;Decreased balance;Decreased mobility;Decreased coordination;Decreased knowledge of use of DME;Decreased safety awareness;Decreased knowledge of precautions;Decreased cognition;Pain       PT  Treatment Interventions DME instruction;Gait training;Functional mobility training;Therapeutic activities;Therapeutic exercise;Balance training;Patient/family education    PT Goals (Current goals can be found in the Care Plan section)  Acute Rehab PT Goals Patient Stated Goal: return home PT Goal Formulation: With patient/family Time For Goal Achievement: 12/24/23 Potential to Achieve Goals: Good    Frequency Min 3X/week     Co-evaluation               AM-PAC PT "6 Clicks" Mobility  Outcome Measure Help needed turning from your back to your side while in a flat bed without using bedrails?: A Little Help needed moving from lying on your back to sitting on the side of a flat bed without using bedrails?: A Lot Help needed moving to and from a bed to a chair (including a wheelchair)?: A Lot Help needed standing up from a chair using your arms (e.g., wheelchair or bedside chair)?: A Lot Help needed to walk in hospital room?: Total Help needed climbing 3-5 steps with a railing? : Total 6 Click Score: 11    End of Session Equipment Utilized  During Treatment: Back brace Activity Tolerance: Patient tolerated treatment well Patient left: in chair;with call bell/phone within reach;with chair alarm set;with family/visitor present Nurse Communication: Mobility status PT Visit Diagnosis: Unsteadiness on feet (R26.81);History of falling (Z91.81);Muscle weakness (generalized) (M62.81);Pain Pain - part of body:  (back)    Time: 1212-1236 PT Time Calculation (min) (ACUTE ONLY): 24 min   Charges:   PT Evaluation $PT Eval Moderate Complexity: 1 Mod PT Treatments $Therapeutic Activity: 8-22 mins PT General Charges $$ ACUTE PT VISIT: 1 Visit         Darcus Eastern, PT  Acute Rehabilitation Services Office [phone] Secure Chat welcomed   Marcial Setting 12/10/2023, 2:01 PM

## 2023-12-11 ENCOUNTER — Encounter (HOSPITAL_COMMUNITY): Payer: Self-pay | Admitting: Neurological Surgery

## 2023-12-11 ENCOUNTER — Other Ambulatory Visit: Payer: Self-pay

## 2023-12-11 LAB — CULTURE, BLOOD (ROUTINE X 2)
Culture: NO GROWTH
Culture: NO GROWTH
Special Requests: ADEQUATE
Special Requests: ADEQUATE

## 2023-12-11 LAB — GLUCOSE, CAPILLARY: Glucose-Capillary: 86 mg/dL (ref 70–99)

## 2023-12-11 MED ORDER — HYDROMORPHONE HCL 1 MG/ML IJ SOLN
1.0000 mg | INTRAMUSCULAR | Status: DC | PRN
  Administered 2023-12-12 – 2023-12-13 (×4): 2 mg via INTRAVENOUS
  Filled 2023-12-11 (×4): qty 2

## 2023-12-11 NOTE — Progress Notes (Signed)
 Mobility Specialist Progress Note:    12/11/23 1007  Therapy Vitals  Patient Position (if appropriate) Orthostatic Vitals  Orthostatic Sitting  BP- Sitting 122/76 (MAP 83)  Pulse- Sitting 68  Mobility  Activity Transferred from chair to bed  Level of Assistance +2 (takes two people)  Press photographer wheel walker  Distance Ambulated (ft) 4 ft  Activity Response Tolerated well  Mobility Referral Yes  Mobility visit 1 Mobility  Mobility Specialist Start Time (ACUTE ONLY) 1043  Mobility Specialist Stop Time (ACUTE ONLY) 1056  Mobility Specialist Time Calculation (min) (ACUTE ONLY) 13 min   Pt received in chair, requesting assistance back to bed. Able to stand on second attempt w/ modA+2 and pivot back to bed. Asymptomatic w/ no complaints throughout. Pt left in bed with call bell and all needs met. Family present.  D'Vante Nolon Baxter Mobility Specialist Please contact via Special educational needs teacher or Rehab office at [phone]

## 2023-12-11 NOTE — Progress Notes (Signed)
 Physical Therapy Treatment Patient Details Name: Norman Lambert MRN: [id number] DOB: 22-Mar-1951 Today's Date: 12/11/2023   History of Present Illness Pt is a 73 yr old male who presented 12/05/23 due to complaints of Ble pain and sever back pain. Pt s/p 12/08/23 redo of laminectomy L3-4 with posterior fixation L2-S1. PMH gout, arthritis, HTN, HLD, HTN, PVD, RBBB, R reverse shoulder, sleep apnea, 11/13/23 PLIF L3-4    PT Comments  Continuing work on functional mobility and activity tolerance;  Initial plan for session was for focusing on progressive amb with chair follow, however noted pt had lightheadedness after initial transfer to the recliner; Opted to get serial BPs in sitting and standing; details as follows:     12/11/23 1003 12/11/23 1007  Orthostatic Sitting  BP- Sitting 117/63 (MAP 80) 122/76 (MAP 83)  Pulse- Sitting 71 68  Orthostatic Standing at 0 minutes  BP- Standing at 0 minutes 95/52 (MAP 67; concurrent with presyncopal symptoms)  --   Pulse- Standing at 0 minutes 97  --    He did have a drop of 22 mmHg SBP between sitting and standing, limiting activity tolerance; Still notable improvements in smoothness of reciprocal scooting at EOB, and pt participated more in donning his lumbar corset; Patient will benefit from continued inpatient follow up therapy, <3 hours/day    If plan is discharge home, recommend the following: A lot of help with walking and/or transfers;A lot of help with bathing/dressing/bathroom   Can travel by private vehicle     No  Equipment Recommendations  Rolling walker (2 wheels);BSC/3in1    Recommendations for Other Services       Precautions / Restrictions Precautions Precautions: Back Precaution Booklet Issued: Yes (comment) Recall of Precautions/Restrictions: Intact Precaution/Restrictions Comments: Able to verbalize 3/3 precautions Required Braces or Orthoses: Spinal Brace Spinal Brace: Lumbar corset Other Brace: AFO for  LLE Restrictions Weight Bearing Restrictions Per Provider Order: No     Mobility  Bed Mobility Overal bed mobility: Needs Assistance Bed Mobility: Rolling, Sidelying to Sit Rolling: Mod assist, Used rails Sidelying to sit: Used rails, HOB elevated, Mod assist       General bed mobility comments: Good log roll and heavy mod assist to push up to sit; very nice effort    Transfers Overall transfer level: Needs assistance Equipment used: Rolling walker (2 wheels) Transfers: Sit to/from Stand, Bed to chair/wheelchair/BSC Sit to Stand: +2 physical assistance, Mod assist   Step pivot transfers: Min assist, +2 physical assistance       General transfer comment: 2 person mod assist with bil support to power up to stand; first attempt unsuccessful, raised bed further, and able to stand up on second rep; stood from recliner with 2 person mod assist; heavy dependence on UEs on RW to fush up to fully upright standing    Ambulation/Gait               General Gait Details: Planned for amb with chair follow today, however, pt with presyncpal symptoms in standing and a BP drop; so deferred amb; see vitals flowsheets   Stairs             Wheelchair Mobility     Tilt Bed    Modified Rankin (Stroke Patients Only)       Balance     Sitting balance-Leahy Scale: Poor (approaching Fair)       Standing balance-Leahy Scale: Poor Standing balance comment: leans hevaily onto RW  Communication Communication Communication: No apparent difficulties  Cognition Arousal: Alert Behavior During Therapy: WFL for tasks assessed/performed   PT - Cognitive impairments: No apparent impairments                       PT - Cognition Comments: improved form previous sessions Following commands: Intact      Cueing Cueing Techniques: Verbal cues  Exercises      General Comments General comments (skin integrity, edema, etc.): O2  sats stable on room air      Pertinent Vitals/Pain Pain Assessment Pain Assessment: 0-10 Pain Score: 7  Pain Location: Low back pain Pain Descriptors / Indicators: Operative site guarding Pain Intervention(s): Monitored during session, Other (comment) (Pt used PCA)    Home Living                          Prior Function            PT Goals (current goals can now be found in the care plan section) Acute Rehab PT Goals Patient Stated Goal: return home safely PT Goal Formulation: With patient/family Time For Goal Achievement: 12/24/23 Potential to Achieve Goals: Good Progress towards PT goals: Progressing toward goals (slowly; progress today limited by BP drop and presyncopal symptoms in standing)    Frequency    Min 3X/week      PT Plan      Co-evaluation              AM-PAC PT "6 Clicks" Mobility   Outcome Measure  Help needed turning from your back to your side while in a flat bed without using bedrails?: A Little Help needed moving from lying on your back to sitting on the side of a flat bed without using bedrails?: A Lot Help needed moving to and from a bed to a chair (including a wheelchair)?: A Lot Help needed standing up from a chair using your arms (e.g., wheelchair or bedside chair)?: A Lot Help needed to walk in hospital room?: Total Help needed climbing 3-5 steps with a railing? : Total 6 Click Score: 11    End of Session Equipment Utilized During Treatment: Back brace;Gait belt Activity Tolerance: Patient tolerated treatment well Patient left: in chair;with call bell/phone within reach;with chair alarm set;with family/visitor present Nurse Communication: Mobility status (and BP drop) PT Visit Diagnosis: Unsteadiness on feet (R26.81);History of falling (Z91.81);Muscle weakness (generalized) (M62.81);Pain Pain - part of body:  (back)     Time: 0930-1010 PT Time Calculation (min) (ACUTE ONLY): 40 min  Charges:    $Therapeutic  Activity: 38-52 mins PT General Charges $$ ACUTE PT VISIT: 1 Visit                     Darcus Eastern, PT  Acute Rehabilitation Services Office [phone] Secure Chat welcomed    Marcial Setting 12/11/2023, 11:02 AM

## 2023-12-11 NOTE — TOC Initial Note (Addendum)
 Transition of Care [facility]) - Initial/Assessment Note    Patient Details  Name: Norman Lambert MRN: [id number] Date of Birth: February 16, 1951  [facility]) CM/SW Contact:    Elspeth Hals, LCSW Phone Number: 12/11/2023, 4:15 PM  Clinical Narrative:   CSW spoke with pt and Wife [name] regarding PT recommendation for SNF.  Pt is not in agreement with SNF, wants to DC home and continue with his current [facility] agency.  Pt did not have agency name but his therapist is Fawn Hooks, [phone].  Pt from home with wife [name], permission given to speak with her.  Some discussion had regarding pt ability to DC home with current mobility as 2 person assist is noted on PT note.  Pt somewhat insistent, wife reports that her sister will be available and their daughter is an Charity fundraiser and also nearby.  Per Pt, MD told him he needs to be able to walk to the bathroom here at the hospital in order to DC home and he thinks he can achieve this. "I feel better everyday."       Expected Discharge Plan: Home w Home Health Services Barriers to Discharge: Continued Medical Work up   Patient Goals and CMS Choice Patient states their goals for this hospitalization and ongoing recovery are:: being able to move, walk   Choice offered to / list presented to : Patient, Spouse (they want to continue with current [facility] agency)      Expected Discharge Plan and Services In-house Referral: Clinical Social Work   Post Acute Care Choice: Home Health Living arrangements for the past 2 months: Single Family Home                                      Prior Living Arrangements/Services Living arrangements for the past 2 months: Single Family Home Lives with:: Spouse Patient language and need for interpreter reviewed:: Yes Do you feel safe going back to the place where you live?: Yes      Need for Family Participation in Patient Care: Yes (Comment) Care giver support system in place?: Yes (comment) Current home  services: Homehealth aide, Home OT (they do not have the name of the [facility] agency, do have phone number) Criminal Activity/Legal Involvement Pertinent to Current Situation/Hospitalization: No - Comment as needed  Activities of Daily Living      Permission Sought/Granted Permission sought to share information with : Family Supports Permission granted to share information with : Yes, Verbal Permission Granted  Share Information with NAME: wife [name]           Emotional Assessment Appearance:: Appears stated age Attitude/Demeanor/Rapport: Engaged Affect (typically observed): Appropriate, Pleasant Orientation: : Oriented to Self, Oriented to Place, Oriented to  Time, Oriented to Situation      Admission diagnosis:  Back pain [M54.9] S/P lumbar fusion [Z98.1] Patient Active Problem List   Diagnosis Date Noted   Back pain 12/05/2023   S/P lumbar fusion 11/13/2023   Spinal stenosis at L4-L5 level 04/23/2020   Essential hypertension 01/02/2017   Hyperlipidemia 01/02/2017   Hypothyroidism 01/02/2017   RBBB 01/02/2017   S/P shoulder replacement, right 12/16/2016   PCP:  Patient, No Pcp Per Pharmacy:   [facility] - [place], [place] - [address] [place] [address] Phone: [phone] Fax: [phone]     Social Drivers of Health (SDOH) Social History: SDOH Screenings  Food Insecurity: No Food Insecurity (12/06/2023)  Housing: Low Risk  (12/06/2023)  Transportation Needs: No Transportation Needs (12/06/2023)  Utilities: Not At Risk (12/06/2023)  Financial Resource Strain: Low Risk  (06/05/2023)   Received from [facility]  Physical Activity: Unknown (06/05/2023)   Received from [facility]  Social Connections: Moderately Isolated (12/06/2023)  Stress: No Stress Concern Present (08/21/2023)   Received from [facility]  Tobacco Use: Medium Risk (12/08/2023)   SDOH Interventions:     Readmission Risk Interventions     No  data to display

## 2023-12-11 NOTE — Progress Notes (Signed)
 Subjective: Patient reports doing much better, pain seems to be more controlled  Objective: Vital signs in last 24 hours: Temp:  [97.6 F (36.4 C)-98.5 F (36.9 C)] 97.6 F (36.4 C) (04/28 1358) Pulse Rate:  [59-67] 67 (04/28 1358) Resp:  [16-19] 16 (04/28 0501) BP: (102-129)/(47-57) 102/47 (04/28 1358) SpO2:  [96 %-100 %] 100 % (04/28 1358) FiO2 (%):  [21 %] 21 % (04/28 1003)  Intake/Output from previous day: 04/27 0701 - 04/28 0700 In: 945 [P.O.:945] Out: 1450 [Urine:975; Drains:200; Blood:275] Intake/Output this shift: No intake/output data recorded.  Neurologic: Grossly normal  Lab Results: Lab Results  Component Value Date   WBC 14.5 (H) 12/09/2023   HGB 8.6 (L) 12/09/2023   HCT 26.2 (L) 12/09/2023   MCV 93.9 12/09/2023   PLT 196 12/09/2023   Lab Results  Component Value Date   INR 1.1 12/07/2023   BMET Lab Results  Component Value Date   NA 136 12/09/2023   K 3.8 12/09/2023   CL 100 12/09/2023   CO2 25 12/09/2023   GLUCOSE 128 (H) 12/09/2023   BUN 22 12/09/2023   CREATININE 0.86 12/09/2023   CALCIUM  7.9 (L) 12/09/2023    Studies/Results: No results found.  Assessment/Plan: Hemovac drain d/c. Continue to work on mobilizing. We did d/c his PCA pump as well. Making improvements for sure.    LOS: 6 days    Kenard Paul [facility] 12/11/2023, 3:50 PM

## 2023-12-12 ENCOUNTER — Encounter (HOSPITAL_COMMUNITY): Payer: Self-pay | Admitting: Neurological Surgery

## 2023-12-12 LAB — CBC
HCT: 24.6 % — ABNORMAL LOW (ref 39.0–52.0)
Hemoglobin: 8 g/dL — ABNORMAL LOW (ref 13.0–17.0)
MCH: 30.5 pg (ref 26.0–34.0)
MCHC: 32.5 g/dL (ref 30.0–36.0)
MCV: 93.9 fL (ref 80.0–100.0)
Platelets: 189 10*3/uL (ref 150–400)
RBC: 2.62 MIL/uL — ABNORMAL LOW (ref 4.22–5.81)
RDW: 12.9 % (ref 11.5–15.5)
WBC: 7.4 10*3/uL (ref 4.0–10.5)
nRBC: 0 % (ref 0.0–0.2)

## 2023-12-12 NOTE — Progress Notes (Signed)
 Mobility Specialist: Progress Note   12/12/23 1424  Mobility  Activity Transferred from chair to bed  Level of Assistance +2 (takes two people)  Press photographer wheel walker  Activity Response Tolerated well  Mobility Referral Yes  Mobility visit 1 Mobility  Mobility Specialist Start Time (ACUTE ONLY) 1228  Mobility Specialist Stop Time (ACUTE ONLY) 1238  Mobility Specialist Time Calculation (min) (ACUTE ONLY) 10 min    Pt requesting to return to bed - received in chair. ModA+2 for STS, MinA+1 for stand pivot to bed. ModA for bed mobility to assist BLEs and rolling. Left in bed with all needs met, call bell in reach. Wife present.   Deloria Fetch Mobility Specialist Please contact via SecureChat or Rehab office at [phone]

## 2023-12-12 NOTE — Progress Notes (Addendum)
 Physical Therapy Treatment Patient Details Name: Norman Lambert MRN: [id number] DOB: August 03, 1951 Today's Date: 12/12/2023   History of Present Illness Pt is a 73 yr old male who presented 12/05/23 due to complaints of Ble pain and sever back pain. Pt s/p 12/08/23 redo of laminectomy L3-4 with posterior fixation L2-S1. PMH gout, arthritis, HTN, HLD, HTN, PVD, RBBB, R reverse shoulder, sleep apnea, 11/13/23 PLIF L3-4    PT Comments  Continuing work on functional mobility and activity tolerance;  Session focused on functional transfers, and initially intended to work on walking to the bathroom; however BP drop again in standing, and pt fatigued after initial sit to stand and bed to chair transfers; Did not get the opportunity to walk today;   Possible that pain medications are effecting Norman Lambert's BPs -- after discussing with pt and wife, Norman Lambert, we will consider having PT session without timing with pain meds;   Lengthy conversation with OT, pt, wife, and daughter via FaceTime; at this point, we must still recommend SNF for post-acute rehab to maximize independence and safety with mobility and ADLs; he very much wants to dc home, and has DME, including wheelchair; Family is supportive;   In order to safely DC home, at this functional level, he will need 2 person assist whenever performing  transfers; or making efforts to walk; Patient will benefit from continued inpatient follow up therapy, <3 hours/day;   Will continue to follow; hopeful for good progress soon   If plan is discharge home, recommend the following: A lot of help with walking and/or transfers;A lot of help with bathing/dressing/bathroom   Can travel by private vehicle     No  Equipment Recommendations  Rolling walker (2 wheels);BSC/3in1    Recommendations for Other Services       Precautions / Restrictions Precautions Precautions: Back Precaution Booklet Issued: Yes (comment) Recall of Precautions/Restrictions:  Intact Precaution/Restrictions Comments: Able to verbalize 3/3 precautions Required Braces or Orthoses: Spinal Brace Spinal Brace: Lumbar corset Other Brace: AFO for LLE Restrictions Weight Bearing Restrictions Per Provider Order: No     Mobility  Bed Mobility Overal bed mobility: Needs Assistance Bed Mobility: Rolling, Sidelying to Sit Rolling: Mod assist, Used rails Sidelying to sit: Used rails, HOB elevated, Mod assist            Transfers Overall transfer level: Needs assistance Equipment used: Rolling walker (2 wheels) Transfers: Sit to/from Stand, Bed to chair/wheelchair/BSC Sit to Stand: +2 physical assistance, Mod assist   Step pivot transfers: Min assist, +2 physical assistance       General transfer comment: Slow rise from slightly elevated bed; Cues to scoot to EOB in prep for standing; Heavy mod assist of 2 for power up and to give safe and appropriate bilateral assist; min assist with pivot steps bed to recliner with RW; close monitor and cues to self-monitor for activity tolerance    Ambulation/Gait                   Stairs             Wheelchair Mobility     Tilt Bed    Modified Rankin (Stroke Patients Only)       Balance                                            Communication Communication Communication: No apparent difficulties  Cognition Arousal: Alert Behavior During Therapy: WFL for tasks assessed/performed   PT - Cognitive impairments: No apparent impairments                         Following commands: Intact      Cueing Cueing Techniques: Verbal cues  Exercises      General Comments General comments (skin integrity, edema, etc.): Orthostatic vitals recorded during session sitting EOB BP 107/66, standing0 95/57      Pertinent Vitals/Pain Pain Assessment Pain Assessment: Faces Faces Pain Scale: Hurts a little bit Pain Location: Low back pain; grimace with movement, could be  related to effort Pain Descriptors / Indicators: Operative site guarding Pain Intervention(s): Monitored during session    Home Living                          Prior Function            PT Goals (current goals can now be found in the care plan section) Acute Rehab PT Goals Patient Stated Goal: return home safely PT Goal Formulation: With patient/family Time For Goal Achievement: 12/24/23 Potential to Achieve Goals: Good Progress towards PT goals: Progressing toward goals (slowly)    Frequency    Min 3X/week      PT Plan      Co-evaluation PT/OT/SLP Co-Evaluation/Treatment: Yes Reason for Co-Treatment: To address functional/ADL transfers;For patient/therapist safety PT goals addressed during session: Mobility/safety with mobility OT goals addressed during session: ADL's and self-care;Strengthening/ROM      AM-PAC PT "6 Clicks" Mobility   Outcome Measure  Help needed turning from your back to your side while in a flat bed without using bedrails?: A Little Help needed moving from lying on your back to sitting on the side of a flat bed without using bedrails?: A Lot Help needed moving to and from a bed to a chair (including a wheelchair)?: A Lot Help needed standing up from a chair using your arms (e.g., wheelchair or bedside chair)?: A Lot Help needed to walk in hospital room?: Total Help needed climbing 3-5 steps with a railing? : Total 6 Click Score: 11    End of Session Equipment Utilized During Treatment: Back brace;Gait belt Activity Tolerance: Patient tolerated treatment well Patient left: in chair;with call bell/phone within reach;with chair alarm set;with family/visitor present Nurse Communication: Mobility status PT Visit Diagnosis: Unsteadiness on feet (R26.81);History of falling (Z91.81);Muscle weakness (generalized) (M62.81);Pain Pain - part of body:  (back)     Time: 2130-8657 PT Time Calculation (min) (ACUTE ONLY): 38 min  Charges:     $Therapeutic Activity: 8-22 mins PT General Charges $$ ACUTE PT VISIT: 1 Visit                     Darcus Eastern, PT  Acute Rehabilitation Services Office [phone] Secure Chat welcomed    Marcial Setting 12/12/2023, 3:05 PM

## 2023-12-12 NOTE — Progress Notes (Signed)
 Patient ID: Norman Lambert, male   DOB: 18-Oct-1950, 73 y.o.   MRN: [id number] Subjective: Patient reports pain is better, sits on side of bed but yet to walk, no leg pain  Objective: Vital signs in last 24 hours: Temp:  [97.5 F (36.4 C)-98.6 F (37 C)] 97.5 F (36.4 C) (04/29 0738) Pulse Rate:  [61-67] 61 (04/29 0738) Resp:  [16-17] 17 (04/29 0738) BP: (102-129)/(47-69) 119/69 (04/29 0738) SpO2:  [95 %-100 %] 99 % (04/29 0738) FiO2 (%):  [21 %] 21 % (04/28 1003)  Intake/Output from previous day: 04/28 0701 - 04/29 0700 In: 50 [P.O.:50] Out: 1500 [Urine:1500] Intake/Output this shift: No intake/output data recorded.  Neurologic: Grossly normal except old L foot drop  Lab Results: Lab Results  Component Value Date   WBC 14.5 (H) 12/09/2023   HGB 8.6 (L) 12/09/2023   HCT 26.2 (L) 12/09/2023   MCV 93.9 12/09/2023   PLT 196 12/09/2023   Lab Results  Component Value Date   INR 1.1 12/07/2023   BMET Lab Results  Component Value Date   NA 136 12/09/2023   K 3.8 12/09/2023   CL 100 12/09/2023   CO2 25 12/09/2023   GLUCOSE 128 (H) 12/09/2023   BUN 22 12/09/2023   CREATININE 0.86 12/09/2023   CALCIUM  7.9 (L) 12/09/2023    Studies/Results: No results found.  Assessment/Plan: Pt/ot Pain better Home when able to walk on his own  Estimated body mass index is 30.38 kg/m as calculated from the following:   Height as of this encounter: 6' (1.829 m).   Weight as of this encounter: 101.6 kg.    LOS: 7 days    Isadora Mar 12/12/2023, 8:12 AM

## 2023-12-12 NOTE — Progress Notes (Signed)
 Occupational Therapy Treatment Patient Details Name: Norman Lambert MRN: [id number] DOB: [birth year] Today's Date: 12/12/2023   History of present illness Pt is a 73 yr old male who presented 12/05/23 due to complaints of Ble pain and sever back pain. Pt s/p 12/08/23 redo of laminectomy L3-4 with posterior fixation L2-S1. PMH gout, arthritis, HTN, HLD, HTN, PVD, RBBB, R reverse shoulder, sleep apnea, 11/13/23 PLIF L3-4   OT comments  Pt progressing well towards goals. Progress made with pt standing tolerance, but pt continues to have decreased balance which is decreasing performance in ADLs. Orthostatic vitals recorded, pt still continues to experience bp drop with positioning. Educated on importance of rehab at skilled facility vs home d/t the amount of support the pt continues to require. Per daughter pt has significant family support in the area to assist, upon d/c. OT educated on the value of rehab in hopes to return to PLOF. OT continues to recommend <3 hours of skilled rehab daily to optimize independence levels. Despite education, pt requesting HHOT, no DME needs. Will continue to follow acutely.       If plan is discharge home, recommend the following:  Two people to help with walking and/or transfers;Two people to help with bathing/dressing/bathroom   Equipment Recommendations  None recommended by OT (All DME needs met)    Recommendations for Other Services      Precautions / Restrictions Precautions Precautions: Back Precaution Booklet Issued: Yes (comment) Recall of Precautions/Restrictions: Intact Precaution/Restrictions Comments: Able to verbalize 3/3 precautions Required Braces or Orthoses: Spinal Brace Spinal Brace: Lumbar corset Other Brace: AFO for LLE Restrictions Weight Bearing Restrictions Per Provider Order: No       Mobility Bed Mobility Overal bed mobility: Needs Assistance Bed Mobility: Rolling, Sidelying to Sit Rolling: Mod assist, Used rails Sidelying to  sit: Used rails, HOB elevated, Mod assist       General bed mobility comments: Cueing for seqencing    Transfers Overall transfer level: Needs assistance Equipment used: Rolling walker (2 wheels) Transfers: Sit to/from Stand, Bed to chair/wheelchair/BSC Sit to Stand: +2 physical assistance, Mod assist     Step pivot transfers: Min assist, +2 physical assistance           Balance Overall balance assessment: Needs assistance Sitting-balance support: Feet supported, Bilateral upper extremity supported Sitting balance-Leahy Scale: Poor     Standing balance support: Bilateral upper extremity supported, During functional activity, Reliant on assistive device for balance Standing balance-Leahy Scale: Poor Standing balance comment: leans hevaily onto RW       ADL either performed or assessed with clinical judgement   ADL Overall ADL's : Needs assistance/impaired       Lower Body Dressing: Maximal assistance;Sit to/from stand Lower Body Dressing Details (indicate cue type and reason): While standing simulated LB dressing, pt does not have standing balance to complete Toilet Transfer: Moderate assistance;+2 for physical assistance;+2 for safety/equipment;Stand-pivot Toilet Transfer Details (indicate cue type and reason): Cueing and sequencing, simulated in room Toileting- Clothing Manipulation and Hygiene: Total assistance;Sit to/from stand Toileting - Clothing Manipulation Details (indicate cue type and reason): While standing simulated pt does not have standing balance to complete     Functional mobility during ADLs: Moderate assistance;+2 for physical assistance;+2 for safety/equipment      Extremity/Trunk Assessment Upper Extremity Assessment Upper Extremity Assessment: Overall WFL for tasks assessed   Lower Extremity Assessment Lower Extremity Assessment: Defer to PT evaluation        Vision   Vision Assessment?: No apparent  visual deficits          Communication Communication Communication: No apparent difficulties   Cognition Arousal: Alert Behavior During Therapy: WFL for tasks assessed/performed Cognition: No apparent impairments       Following commands: Intact        Cueing   Cueing Techniques: Verbal cues        General Comments Orthostatic vitals recorded during session sitting EOB BP 107/66, standing0 95/57    Pertinent Vitals/ Pain       Pain Assessment Pain Assessment: Faces Faces Pain Scale: Hurts a little bit Pain Location: Low back pain; grimace with movement, could be related to effort Pain Descriptors / Indicators: Operative site guarding Pain Intervention(s): Monitored during session   Frequency  Min 2X/week        Progress Toward Goals  OT Goals(current goals can now be found in the care plan section)  Progress towards OT goals: Progressing toward goals  Acute Rehab OT Goals Patient Stated Goal: To go home OT Goal Formulation: With patient Time For Goal Achievement: 12/23/23 Potential to Achieve Goals: Good ADL Goals Pt Will Perform Grooming: with set-up;sitting Pt Will Perform Upper Body Bathing: with set-up;sitting Pt Will Perform Lower Body Bathing: with min assist;sit to/from stand Pt Will Transfer to Toilet: with contact guard assist;ambulating Pt Will Perform Toileting - Clothing Manipulation and hygiene: with contact guard assist;sit to/from stand  Plan      Co-evaluation    PT/OT/SLP Co-Evaluation/Treatment: Yes Reason for Co-Treatment: To address functional/ADL transfers;For patient/therapist safety PT goals addressed during session: Mobility/safety with mobility OT goals addressed during session: ADL's and self-care;Strengthening/ROM      AM-PAC OT "6 Clicks" Daily Activity     Outcome Measure   Help from another person eating meals?: None Help from another person taking care of personal grooming?: A Little Help from another person toileting, which includes using  toliet, bedpan, or urinal?: Total Help from another person bathing (including washing, rinsing, drying)?: A Lot Help from another person to put on and taking off regular upper body clothing?: A Little Help from another person to put on and taking off regular lower body clothing?: A Lot 6 Click Score: 15    End of Session Equipment Utilized During Treatment: Rolling walker (2 wheels);Back brace  OT Visit Diagnosis: Unsteadiness on feet (R26.81);Muscle weakness (generalized) (M62.81);Pain   Activity Tolerance Patient limited by fatigue;Patient limited by pain   Patient Left in chair;with call bell/phone within reach;with chair alarm set;with family/visitor present   Nurse Communication Mobility status        Time: 1610-9604 OT Time Calculation (min): 42 min  Charges: OT General Charges $OT Visit: 1 Visit  Norman Lambert, OT  Acute Rehabilitation Services Office [phone] Secure chat preferred   Norman Lambert 12/12/2023, 3:01 PM

## 2023-12-12 NOTE — TOC Progression Note (Signed)
 Transition of Care [facility]) - Progression Note    Patient Details  Name: Norman Lambert MRN: [id number] Date of Birth: Aug 29, 1950  [facility] CM/SW Contact  Elspeth Hals, LCSW Phone Number: 12/12/2023, 11:40 AM  Clinical Narrative: PT continues to recommend SNF, CSW spoke with pt, wife, daughter on facetime.  Discussed SNF vs HH.  Pt continues to state does not want to move forward with SNF.  Family is supportive, multiple family members around to assist.      Expected Discharge Plan: Home w Home Health Services Barriers to Discharge: Continued Medical Work up  Expected Discharge Plan and Services In-house Referral: Clinical Social Work   Post Acute Care Choice: Home Health Living arrangements for the past 2 months: Single Family Home                                       Social Determinants of Health (SDOH) Interventions SDOH Screenings   Food Insecurity: No Food Insecurity (12/06/2023)  Housing: Low Risk  (12/06/2023)  Transportation Needs: No Transportation Needs (12/06/2023)  Utilities: Not At Risk (12/06/2023)  Financial Resource Strain: Low Risk  (06/05/2023)   Received from [facility]  Physical Activity: Unknown (06/05/2023)   Received from [facility]  Social Connections: Moderately Isolated (12/11/2023)  Stress: No Stress Concern Present (08/21/2023)   [facility] from [facility]  Tobacco Use: Medium Risk (12/08/2023)    Readmission Risk Interventions     No data to display

## 2023-12-13 MED ORDER — AMLODIPINE BESYLATE 5 MG PO TABS
5.0000 mg | ORAL_TABLET | Freq: Every day | ORAL | Status: DC
  Administered 2023-12-13 – 2023-12-15 (×3): 5 mg via ORAL
  Filled 2023-12-13 (×3): qty 1

## 2023-12-13 MED ORDER — HYDROMORPHONE HCL 1 MG/ML IJ SOLN
0.5000 mg | INTRAMUSCULAR | Status: DC | PRN
  Administered 2023-12-13 – 2023-12-20 (×11): 0.5 mg via INTRAVENOUS
  Filled 2023-12-13: qty 0.5
  Filled 2023-12-13 (×8): qty 1
  Filled 2023-12-13: qty 0.5
  Filled 2023-12-13 (×2): qty 1

## 2023-12-13 MED ORDER — ENOXAPARIN SODIUM 60 MG/0.6ML IJ SOSY
50.0000 mg | PREFILLED_SYRINGE | INTRAMUSCULAR | Status: DC
  Administered 2023-12-13 – 2023-12-24 (×12): 50 mg via SUBCUTANEOUS
  Filled 2023-12-13 (×12): qty 0.6

## 2023-12-13 MED ORDER — OXYCODONE HCL ER 10 MG PO T12A
10.0000 mg | EXTENDED_RELEASE_TABLET | Freq: Two times a day (BID) | ORAL | Status: DC
  Administered 2023-12-13 – 2023-12-19 (×12): 10 mg via ORAL
  Filled 2023-12-13 (×12): qty 1

## 2023-12-13 MED ORDER — DIAZEPAM 5 MG PO TABS
5.0000 mg | ORAL_TABLET | Freq: Once | ORAL | Status: AC
  Administered 2023-12-13: 5 mg via ORAL
  Filled 2023-12-13: qty 1

## 2023-12-13 NOTE — Progress Notes (Addendum)
 Physical Therapy Treatment Note  (Full PT note to follow)  Worked with pt today without intentional premedication for pain to try to rule pain meds in or out as a main cause for BP drop in standing;  Pt still with BP drop in standing without premedicating for pain;  Will consider using TED hose next session;  Postural hypotension and pain are limiting Norman Lambert's activity tolerance and amb distance;      12/13/23 1300 12/13/23 1305  Orthostatic Lying   BP- Lying 111/50 (MAP 69)  --   Pulse- Lying 76  --   Orthostatic Sitting  BP- Sitting  (Did not show a BP drop, and pt asymptomatic; did not capture reading before starting the standing BP (apologies)) 115/54 (MAP 72; sitting almost immediately after attemtp for standing 3 min BP; feet down, sitting upright)  Pulse- Sitting  --  96  Orthostatic Standing at 0 minutes  BP- Standing at 0 minutes (!) 75/48 (MAP 56)  --   Pulse- Standing at 0 minutes 115  --   Orthostatic Standing at 3 minutes  BP- Standing at 3 minutes  (Unable to stand long enough to obtain 3 min standing BP)  --    Pt's nurse is aware; will notify Neurosurgery;   14:20 -- left a message for Dr. Rochelle Chu and Jullie Oiler, NP at their office.  Darcus Eastern, PT  Acute Rehabilitation Services Office [phone] Secure Chat welcomed

## 2023-12-13 NOTE — Progress Notes (Signed)
 Pt s/p redo decompressive laminectomy on 4/25. BMI 30.  Lovenox 50mg  SQ qday  Ivery Marking, PharmD, [place], AAHIVP, CPP Infectious Disease Pharmacist 12/13/2023 3:57 PM

## 2023-12-13 NOTE — Progress Notes (Signed)
 Pt c/o 9/10 pain left foot. No relief after receiving oxycodone , methocarbamol , or hydromorphone . Contacted Dr. Cabbell and received order for now dose of 5mg  daizepam and 10mg  oxycontin  BID.

## 2023-12-13 NOTE — Plan of Care (Signed)
  Problem: Activity: Goal: Risk for activity intolerance will decrease Outcome: Progressing   Problem: Pain Managment: Goal: General experience of comfort will improve and/or be controlled Outcome: Progressing

## 2023-12-13 NOTE — Progress Notes (Signed)
 Physical Therapy Treatment Patient Details Name: Norman Lambert MRN: [id number] DOB: 27-Oct-1950 Today's Date: 12/13/2023   History of Present Illness Pt is a 73 yr old male who presented 12/05/23 due to complaints of Ble pain and sever back pain. Pt s/p 12/08/23 redo of laminectomy L3-4 with posterior fixation L2-S1. PMH gout, arthritis, HTN, HLD, HTN, PVD, RBBB, R reverse shoulder, sleep apnea, 11/13/23 PLIF L3-4    PT Comments  Continuing work on functional mobility and activity tolerance;  Session focused on functional transfers and progressive amb as able; opted to time session a bit further from pain meds to see if pain meds have played any role in pt's postural hypotension -- and still, Jaquail's BP dropped with standing activity, so we can surmise that if pain meds have played a role in BP drop, it's minimal; will consider using TED hose next session -- perhaps it's time to consider a Hospitalist Consult?  Rikki still refuses to consider SNF for post-acute rehab, and wants to dc home; He still requires 2 person assist with sit to stand, step pivot transfers, and he hasn't tolerated walking more than 6 feet; he has a supportive family, and tells me that his daughters can stay overnight -- still, at this point, I continue to recommend SNF for post-acute rehab to maximize independence and safety with mobility    If plan is discharge home, recommend the following: A lot of help with walking and/or transfers;A lot of help with bathing/dressing/bathroom   Can travel by private vehicle     No  Equipment Recommendations  Rolling walker (2 wheels);BSC/3in1    Recommendations for Other Services       Precautions / Restrictions Precautions Precautions: Back Precaution Booklet Issued: Yes (comment) Recall of Precautions/Restrictions: Intact Precaution/Restrictions Comments: Able to verbalize 3/3 precautions Required Braces or Orthoses: Spinal Brace Spinal Brace: Lumbar corset Other Brace: AFO for  LLE Restrictions Weight Bearing Restrictions Per Provider Order: No     Mobility  Bed Mobility Overal bed mobility: Needs Assistance Bed Mobility: Rolling, Sidelying to Sit Rolling: Mod assist, Used rails Sidelying to sit: Used rails, HOB elevated, Mod assist       General bed mobility comments: Cueing for seqencing    Transfers Overall transfer level: Needs assistance Equipment used: Rolling walker (2 wheels) Transfers: Sit to/from Stand, Bed to chair/wheelchair/BSC Sit to Stand: +2 physical assistance, Mod assist   Step pivot transfers: Min assist, +2 physical assistance       General transfer comment: Slow rise from slightly elevated bed; Cues to scoot to EOB in prep for standing; Heavy mod assist of 2 for power up and to give safe and appropriate bilateral assist; lighter mod assist with pivot steps bed to recliner with RW; close monitor for safety    Ambulation/Gait Ambulation/Gait assistance: +2 safety/equipment, Min assist Gait Distance (Feet): 6 Feet Assistive device: Rolling walker (2 wheels) Gait Pattern/deviations: Decreased step length - right, Decreased step length - left       General Gait Details: Heavy dependence on RW for support; amb distance limited by syncopal symptoms and BP drop   Stairs             Wheelchair Mobility     Tilt Bed    Modified Rankin (Stroke Patients Only)       Balance     Sitting balance-Leahy Scale: Fair       Standing balance-Leahy Scale: Poor Standing balance comment: leans hevaily onto RW  Communication Communication Communication: No apparent difficulties  Cognition Arousal: Alert Behavior During Therapy: WFL for tasks assessed/performed   PT - Cognitive impairments: No apparent impairments                         Following commands: Intact      Cueing Cueing Techniques: Verbal cues  Exercises      General Comments General comments (skin  integrity, edema, etc.): see other PT note of this date for details related to vitals      Pertinent Vitals/Pain Pain Assessment Faces Pain Scale: Hurts a little bit Pain Location: Low back pain; grimace with movement, could be related to effort Pain Descriptors / Indicators: Operative site guarding    Home Living                          Prior Function            PT Goals (current goals can now be found in the care plan section) Acute Rehab PT Goals Patient Stated Goal: return home safely PT Goal Formulation: With patient/family Time For Goal Achievement: 12/24/23 Potential to Achieve Goals: Good Progress towards PT goals: Progressing toward goals (but limited today by BP drop)    Frequency    Min 3X/week      PT Plan      Co-evaluation              AM-PAC PT "6 Clicks" Mobility   Outcome Measure  Help needed turning from your back to your side while in a flat bed without using bedrails?: A Little Help needed moving from lying on your back to sitting on the side of a flat bed without using bedrails?: A Lot Help needed moving to and from a bed to a chair (including a wheelchair)?: A Lot Help needed standing up from a chair using your arms (e.g., wheelchair or bedside chair)?: A Lot Help needed to walk in hospital room?: Total Help needed climbing 3-5 steps with a railing? : Total 6 Click Score: 11    End of Session Equipment Utilized During Treatment: Back brace;Gait belt Activity Tolerance: Other (comment) (limited by syncopal symptoms in standing) Patient left: in chair;with call bell/phone within reach;with chair alarm set;with family/visitor present Nurse Communication: Mobility status;Other (comment) (and BP drop) PT Visit Diagnosis: Unsteadiness on feet (R26.81);History of falling (Z91.81);Muscle weakness (generalized) (M62.81);Pain Pain - part of body:  (back)     Time: 1207-1239 PT Time Calculation (min) (ACUTE ONLY): 32  min  Charges:    $Gait Training: 8-22 mins $Therapeutic Activity: 8-22 mins PT General Charges $$ ACUTE PT VISIT: 1 Visit                     Darcus Eastern, PT  Acute Rehabilitation Services Office [phone] Secure Chat welcomed     Marcial Setting 12/13/2023, 2:26 PM

## 2023-12-13 NOTE — Progress Notes (Signed)
 Mobility Specialist Progress Note:    12/13/23 1305  Orthostatic Sitting  BP- Sitting 115/54 (MAP 72; sitting almost immediately after attemtp for standing 3 min BP; feet down, sitting upright)  Pulse- Sitting 96  Mobility  Activity Transferred from chair to bed  Level of Assistance +2 (takes two people)  Press photographer wheel walker  Distance Ambulated (ft) 4 ft  Activity Response Tolerated well  Mobility Referral Yes  Mobility visit 1 Mobility  Mobility Specialist Start Time (ACUTE ONLY) 1254  Mobility Specialist Stop Time (ACUTE ONLY) 1307  Mobility Specialist Time Calculation (min) (ACUTE ONLY) 13 min   Pt received in chair, requesting to get back to bed. Required modA+2 to stand and pivot to bed. MaxA+2 to go from sit to supine d/t pain. Pt left in bed w/ NT and family present.  D'Vante Nolon Baxter Mobility Specialist Please contact via Special educational needs teacher or Rehab office at [phone]

## 2023-12-13 NOTE — Progress Notes (Addendum)
 Subjective: Patient reports doing better, pain is controlled. Still having trouble ambulating but insists on going home and not a SNF  Objective: Vital signs in last 24 hours: Temp:  [97.2 F (36.2 C)-98.3 F (36.8 C)] 98 F (36.7 C) (04/30 0832) Pulse Rate:  [65-70] 65 (04/30 0832) Resp:  [16-18] 18 (04/30 0832) BP: (108-122)/(46-58) 122/58 (04/30 0832) SpO2:  [96 %-100 %] 100 % (04/30 0832)  Intake/Output from previous day: 04/29 0701 - 04/30 0700 In: 360 [P.O.:360] Out: 1350 [Urine:1350] Intake/Output this shift: No intake/output data recorded.  Neurologic: Grossly normal  Lab Results: Lab Results  Component Value Date   WBC 7.4 12/12/2023   HGB 8.0 (L) 12/12/2023   HCT 24.6 (L) 12/12/2023   MCV 93.9 12/12/2023   PLT 189 12/12/2023   Lab Results  Component Value Date   INR 1.1 12/07/2023   BMET Lab Results  Component Value Date   NA 136 12/09/2023   K 3.8 12/09/2023   CL 100 12/09/2023   CO2 25 12/09/2023   GLUCOSE 128 (H) 12/09/2023   BUN 22 12/09/2023   CREATININE 0.86 12/09/2023   CALCIUM  7.9 (L) 12/09/2023    Studies/Results: No results found.  Assessment/Plan: Postop day 5 extension of fusion. Continue to work on ambulating more. Would like to see him walk in the hallway today. He needs more ambulating than just with PT daily, ordered for nurse or nurse tech to help ambulate twice daily as well   LOS: 8 days    Norman Lambert 12/13/2023, 8:54 AM

## 2023-12-14 ENCOUNTER — Inpatient Hospital Stay (HOSPITAL_COMMUNITY)

## 2023-12-14 MED ORDER — DEXAMETHASONE SODIUM PHOSPHATE 4 MG/ML IJ SOLN
4.0000 mg | Freq: Four times a day (QID) | INTRAMUSCULAR | Status: AC
  Administered 2023-12-14 – 2023-12-15 (×4): 4 mg via INTRAVENOUS
  Filled 2023-12-14 (×4): qty 1

## 2023-12-14 MED ORDER — GABAPENTIN 300 MG PO CAPS
300.0000 mg | ORAL_CAPSULE | Freq: Three times a day (TID) | ORAL | Status: DC
  Administered 2023-12-14 – 2024-01-06 (×65): 300 mg via ORAL
  Filled 2023-12-14 (×67): qty 1

## 2023-12-14 NOTE — Plan of Care (Signed)

## 2023-12-14 NOTE — Progress Notes (Signed)
 Patient received 0.5mg  dilaudid  for c/o 9/10 pain. Continues to report minimal relief from current pain medication regimen.

## 2023-12-14 NOTE — Progress Notes (Signed)
 Within an hr of giving 0.5mg  dilaudid  iv pt c/o 20/10 pain stating "I feel like someone reached into my left thigh and squeezed the hell out of my muscle." Pt is moaning and wife is tearful. 10mg  oxycodone  and 500mg  methocarbamol  given as ordered. Will assess in 1hr and contact on call MD again if pain has not decreased.

## 2023-12-14 NOTE — Plan of Care (Signed)
  Problem: Education: Goal: Knowledge of General Education information will improve Description: Including pain rating scale, medication(s)/side effects and non-pharmacologic comfort measures Outcome: Progressing   Problem: Clinical Measurements: Goal: Ability to maintain clinical measurements within normal limits will improve Outcome: Progressing Goal: Will remain free from infection Outcome: Progressing Goal: Diagnostic test results will improve Outcome: Progressing   Problem: Pain Managment: Goal: General experience of comfort will improve and/or be controlled Outcome: Not Progressing   Problem: Safety: Goal: Ability to remain free from injury will improve Outcome: Progressing   Problem: Skin Integrity: Goal: Risk for impaired skin integrity will decrease Outcome: Progressing

## 2023-12-14 NOTE — Progress Notes (Signed)
 PT Cancellation Note  Patient Details Name: Norman Lambert MRN: [id number] DOB: [birth year]   Cancelled Treatment:    Reason Eval/Treat Not Completed: (P) Patient declined, no reason specified;Pain limiting ability to participate. Pt reports he is in too much pain to participate at this time. Notified RN of pt's request for pain meds. TED hose provided and tried on pt's R leg with noted good fit. Will plan to follow-up later as time permits.   Vernida Goodie, PT, DPT Acute Rehabilitation Services  Office: [phone]    Ellyn Hack 12/14/2023, 10:47 AM

## 2023-12-14 NOTE — Progress Notes (Signed)
 Patient ID: Norman Lambert, male   DOB: [birth year], 73 y.o.   MRN: [id number] He has significantly increased pain today.  It is in the right hip and the left shin and foot.  Unclear etiology.  Pain was very severe through the night.  No change in neuroexam.  Wound VAC is in place.  There has been drainage through the drain hole above this.  Obviously, given the situation where his construct failed so soon after surgery, I have concerns about the construct so I will check some x-rays today.  He has not really been up at all.  He has been on the side of the bed and they did moving to a chair yesterday for the first time.  He has not walked.  So there has not been much stress on the construct but obviously I have concern given the history here  Add gabapentin .  Will add a day of Decadron .  Continue pain control.  Probably should hold PT and OT today.

## 2023-12-14 NOTE — Progress Notes (Signed)
 Pt was asleep at 1hr reassessment. Assessed at 1:40am pt reports pain is 10/10. Will give 0.5mg  dilaudid  iv.

## 2023-12-14 DEATH — deceased

## 2023-12-15 LAB — BASIC METABOLIC PANEL WITH GFR
Anion gap: 12 (ref 5–15)
BUN: 29 mg/dL — ABNORMAL HIGH (ref 8–23)
CO2: 21 mmol/L — ABNORMAL LOW (ref 22–32)
Calcium: 7.7 mg/dL — ABNORMAL LOW (ref 8.9–10.3)
Chloride: 96 mmol/L — ABNORMAL LOW (ref 98–111)
Creatinine, Ser: 1.03 mg/dL (ref 0.61–1.24)
GFR, Estimated: >60 mL/min (ref >60)
Glucose, Bld: 142 mg/dL — ABNORMAL HIGH (ref 70–99)
Potassium: 3.5 mmol/L (ref 3.5–5.1)
Sodium: 129 mmol/L — ABNORMAL LOW (ref 135–145)

## 2023-12-15 LAB — CBC
HCT: 22.2 % — ABNORMAL LOW (ref 39.0–52.0)
Hemoglobin: 7.5 g/dL — ABNORMAL LOW (ref 13.0–17.0)
MCH: 31.3 pg (ref 26.0–34.0)
MCHC: 33.8 g/dL (ref 30.0–36.0)
MCV: 92.5 fL (ref 80.0–100.0)
Platelets: 250 10*3/uL (ref 150–400)
RBC: 2.4 MIL/uL — ABNORMAL LOW (ref 4.22–5.81)
RDW: 13.2 % (ref 11.5–15.5)
WBC: 17 10*3/uL — ABNORMAL HIGH (ref 4.0–10.5)
nRBC: 0 % (ref 0.0–0.2)

## 2023-12-15 LAB — GLUCOSE, CAPILLARY: Glucose-Capillary: 124 mg/dL — ABNORMAL HIGH (ref 70–99)

## 2023-12-15 MED ORDER — SODIUM CHLORIDE 0.9 % IV BOLUS
500.0000 mL | Freq: Once | INTRAVENOUS | Status: AC
  Administered 2023-12-15: 500 mL via INTRAVENOUS

## 2023-12-15 NOTE — Care Management Important Message (Signed)
 Important Message  Patient Details  Name: Norman Lambert MRN: [id number] Date of Birth: 24-May-1951   Important Message Given:  Yes - Medicare IM     Felix Host 12/15/2023, 12:08 PM

## 2023-12-15 NOTE — Progress Notes (Signed)
 Subjective: Patient reports doing a lot better today. His pain is much more controlled last night and today.   Objective: Vital signs in last 24 hours: Temp:  [97.5 F (36.4 C)-98.4 F (36.9 C)] 98.4 F (36.9 C) (05/02 0814) Pulse Rate:  [65-73] 73 (05/02 0814) Resp:  [17-18] 17 (05/02 0814) BP: (111-120)/(48-52) 115/50 (05/02 1010) SpO2:  [93 %-94 %] 94 % (05/02 0814)  Intake/Output from previous day: 05/01 0701 - 05/02 0700 In: 100 [P.O.:100] Out: 500 [Urine:500] Intake/Output this shift: Total I/O In: 240 [P.O.:240] Out: 300 [Urine:300]  Neurologic: Grossly normal  Lab Results: Lab Results  Component Value Date   WBC 7.4 12/12/2023   HGB 8.0 (L) 12/12/2023   HCT 24.6 (L) 12/12/2023   MCV 93.9 12/12/2023   PLT 189 12/12/2023   Lab Results  Component Value Date   INR 1.1 12/07/2023   BMET Lab Results  Component Value Date   NA 136 12/09/2023   K 3.8 12/09/2023   CL 100 12/09/2023   CO2 25 12/09/2023   GLUCOSE 128 (H) 12/09/2023   BUN 22 12/09/2023   CREATININE 0.86 12/09/2023   CALCIUM  7.9 (L) 12/09/2023    Studies/Results: DG Lumbar Spine 2-3 Views Result Date: 12/14/2023 CLINICAL DATA:  Status post lumbar fusion EXAM: LUMBAR SPINE - 2 VIEW COMPARISON:  12/08/2023 FINDINGS: Postsurgical changes are noted extending from L2-S1. Interbody fusion is again seen at L3-4. No hardware failure is noted. Diffuse aortic calcifications are seen. IMPRESSION: Status post L2-S1 fusion. Electronically Signed   By: Violeta Grey M.D.   On: 12/14/2023 09:49    Assessment/Plan: Postop day 7 extension of fusion L2-S1 for hardware failure. He is doing a lot better today. He has one more day left on his prevena wound vac and then that can be d/c. Continue to work with therapy as tolerated. I do think he is going to need to go to a SNF    LOS: 10 days    Kenard Paul Rebekah Sprinkle 12/15/2023, 12:19 PM

## 2023-12-15 NOTE — Progress Notes (Signed)
 Pt doing well all day until change of shift around  1915 tonight, nurse assistant called me saying pt some tremors and not feeling well. Night shift took over assignment and charge nurse also took over pt care,. Wife at bedside.  I did not witness any tremors , vss .

## 2023-12-15 NOTE — NC FL2 (Signed)
 [place]  MEDICAID FL2 LEVEL OF CARE FORM     IDENTIFICATION  Patient Name: Norman Lambert Birthdate: [birth year] Sex: male Admission Date (Current Location): 12/05/2023  [facility] and [place] Number:  Producer, television/film/video and Address:  [facility] [place], [address] [place], [place] [address]      Provider Number: [id number]  Attending Physician Name and Address:  Joaquin Mulberry, MD  Relative Name and Phone Number:  Jeffren, Maxted [phone]    Current Level of Care: Hospital Recommended Level of Care: Skilled Nursing Facility Prior Approval Number:    Date Approved/Denied:   PASRR Number:    Discharge Plan: SNF    Current Diagnoses: Patient Active Problem List   Diagnosis Date Noted   Back pain 12/05/2023   S/P lumbar fusion 11/13/2023   Spinal stenosis at L4-L5 level 04/23/2020   Essential hypertension 01/02/2017   Hyperlipidemia 01/02/2017   Hypothyroidism 01/02/2017   RBBB 01/02/2017   S/P shoulder replacement, right 12/16/2016    Orientation RESPIRATION BLADDER Height & Weight     Self, Time, Situation, Place  Normal Continent Weight: 224 lb (101.6 kg) Height:  6' (182.9 cm)  BEHAVIORAL SYMPTOMS/MOOD NEUROLOGICAL BOWEL NUTRITION STATUS      Continent Diet (see discharge summary)  AMBULATORY STATUS COMMUNICATION OF NEEDS Skin   Limited Assist Verbally Surgical wounds, Other (Comment) (ecchymosis)                       Personal Care Assistance Level of Assistance  Bathing, Feeding, Dressing Bathing Assistance: Limited assistance Feeding assistance: Independent Dressing Assistance: Limited assistance     Functional Limitations Info  Sight, Hearing, Speech Sight Info: Adequate Hearing Info: Adequate Speech Info: Adequate    SPECIAL CARE FACTORS FREQUENCY  PT (By licensed PT), OT (By licensed OT)     PT Frequency: 5x week OT Frequency: 5x weel            Contractures Contractures Info: Not present     Additional Factors Info  Code Status, Allergies Code Status Info: full Allergies Info: Benzoic Acid, Imidurea, Urea , Other           Current Medications (12/15/2023):  This is the current hospital active medication list Current Facility-Administered Medications  Medication Dose Route Frequency Provider Last Rate Last Admin   amLODipine  (NORVASC ) tablet 5 mg  5 mg Oral Daily Joaquin Mulberry, MD   5 mg at 12/15/23 1010   ascorbic acid  (VITAMIN C ) tablet 1,000 mg  1,000 mg Oral Daily Joaquin Mulberry, MD   1,000 mg at 12/15/23 1002   cyanocobalamin  (VITAMIN B12) tablet 1,000 mcg  1,000 mcg Oral Daily Joaquin Mulberry, MD   1,000 mcg at 12/15/23 1001   enoxaparin  (LOVENOX ) injection 50 mg  50 mg Subcutaneous Q24H Pham, Minh Q, RPH-CPP   50 mg at 12/14/23 1832   feeding supplement (ENSURE ENLIVE / ENSURE PLUS) liquid 237 mL  237 mL Oral BID BM Joaquin Mulberry, MD   237 mL at 12/15/23 1507   finasteride  (PROSCAR ) tablet 5 mg  5 mg Oral Daily Joaquin Mulberry, MD   5 mg at 12/15/23 1006   folic acid  (FOLVITE ) tablet 1 mg  1 mg Oral Daily Joaquin Mulberry, MD   1 mg at 12/15/23 1006   gabapentin  (NEURONTIN ) capsule 300 mg  300 mg Oral TID Joaquin Mulberry, MD   300 mg at 12/15/23 1000   hydrochlorothiazide  (HYDRODIURIL ) tablet  25 mg  25 mg Oral Daily Joaquin Mulberry, MD   25 mg at 12/15/23 1002   HYDROmorphone  (DILAUDID ) injection 0.5 mg  0.5 mg Intravenous Q4H PRN Joaquin Mulberry, MD   0.5 mg at 12/15/23 0602   irbesartan  (AVAPRO ) tablet 37.5 mg  37.5 mg Oral Daily Joaquin Mulberry, MD   37.5 mg at 12/15/23 1008   levothyroxine  (SYNTHROID ) tablet 112 mcg  112 mcg Oral Q0600 Joaquin Mulberry, MD   112 mcg at 12/15/23 0602   loratadine  (CLARITIN ) tablet 10 mg  10 mg Oral Daily Joaquin Mulberry, MD   10 mg at 12/15/23 1008   menthol -cetylpyridinium (CEPACOL) lozenge 3 mg  1 lozenge Oral PRN Joaquin Mulberry, MD       Or   phenol (CHLORASEPTIC) mouth spray 1 spray  1  spray Mouth/Throat PRN Joaquin Mulberry, MD       methocarbamol  (ROBAXIN ) tablet 500 mg  500 mg Oral Q6H PRN Joaquin Mulberry, MD   500 mg at 12/14/23 2110   Or   methocarbamol  (ROBAXIN ) injection 500 mg  500 mg Intravenous Q6H PRN Joaquin Mulberry, MD   500 mg at 12/08/23 1733   modafinil  (PROVIGIL ) tablet 200 mg  200 mg Oral Daily Joaquin Mulberry, MD   200 mg at 12/15/23 1007   ondansetron  (ZOFRAN ) tablet 4 mg  4 mg Oral Q6H PRN Joaquin Mulberry, MD       Or   ondansetron  (ZOFRAN ) injection 4 mg  4 mg Intravenous Q6H PRN Joaquin Mulberry, MD       oxyCODONE  (Oxy IR/ROXICODONE ) immediate release tablet 10 mg  10 mg Oral Q4H PRN Joaquin Mulberry, MD   10 mg at 12/15/23 1455   oxyCODONE  (OXYCONTIN ) 12 hr tablet 10 mg  10 mg Oral Q12H Cabbell, Kyle, MD   10 mg at 12/15/23 1610   pantoprazole  (PROTONIX ) EC tablet 40 mg  40 mg Oral Daily Joaquin Mulberry, MD   40 mg at 12/15/23 1003   senna (SENOKOT) tablet 8.6 mg  1 tablet Oral BID Joaquin Mulberry, MD   8.6 mg at 12/15/23 1002   sodium chloride  flush (NS) 0.9 % injection 3 mL  3 mL Intravenous Q12H Joaquin Mulberry, MD   3 mL at 12/15/23 1011   sodium chloride  flush (NS) 0.9 % injection 3 mL  3 mL Intravenous PRN Joaquin Mulberry, MD       tamsulosin  (FLOMAX ) capsule 0.8 mg  0.8 mg Oral Daily Joaquin Mulberry, MD   0.8 mg at 12/15/23 1001   zinc  sulfate (50mg  elemental zinc ) capsule 220 mg  220 mg Oral Daily Joaquin Mulberry, MD   220 mg at 12/15/23 1001     Discharge Medications: Please see discharge summary for a list of discharge medications.  Relevant Imaging Results:  Relevant Lab Results:   Additional Information SSN: [id number]  Elspeth Hals, LCSW

## 2023-12-15 NOTE — TOC Progression Note (Signed)
 Transition of Care [facility]) - Progression Note    Patient Details  Name: Norman Lambert MRN: [id number] Date of Birth: Jun 09, 1951  [facility] CM/SW Contact  Elspeth Hals, LCSW Phone Number: 12/15/2023, 4:38 PM  Clinical Narrative:   CSW spoke with pt and wife CArol, they are now willing to consider SNF but asking for placement near [facility] as they live in [place] [address].  Medicare choice document provided and they are going to look at it for SNF they are interested in.  Referral sent to [facility] on the hub: [employer].      Expected Discharge Plan: Home w Home Health Services Barriers to Discharge: Continued Medical Work up  Expected Discharge Plan and Services In-house Referral: Clinical Social Work   Post Acute Care Choice: Home Health Living arrangements for the past 2 months: Single Family Home                                       Social Determinants of Health (SDOH) Interventions SDOH Screenings   Food Insecurity: No Food Insecurity (12/06/2023)  Housing: Low Risk  (12/06/2023)  Transportation Needs: No Transportation Needs (12/06/2023)  Utilities: Not At Risk (12/06/2023)  Financial Resource Strain: Low Risk  (06/05/2023)   Received from [facility]  Physical Activity: Unknown (06/05/2023)   Received from [facility]  Social Connections: Moderately Isolated (12/11/2023)  Stress: No Stress Concern Present (08/21/2023)   Received from [facility]  Tobacco Use: Medium Risk (12/08/2023)    Readmission Risk Interventions     No data to display

## 2023-12-15 NOTE — Progress Notes (Signed)
 Physical Therapy Treatment Patient Details Name: Norman Lambert MRN: [id number] DOB: Mar 18, 1951 Today's Date: 12/15/2023   History of Present Illness Pt is a 73 yr old male who presented 12/05/23 due to complaints of Ble pain and sever back pain. Pt s/p 12/08/23 redo of laminectomy L3-4 with posterior fixation L2-S1. PMH gout, arthritis, HTN, HLD, HTN, PVD, RBBB, R reverse shoulder, sleep apnea, 11/13/23 PLIF L3-4    PT Comments  Pt ensured pt knew of PT arrival time in case he wanted to premedicate. Pt overall requiring mod +1 assist for bed mobility and transfer OOB to recliner. Pt initially reporting dizziness upon sitting, PT took BP manually which was 96/42. PT elevated Les and reclined pt slightly, added SCDs. BP after 10 minutes in this position 95/43, so PT assisted pt back to bed for further BP recovery. Pt definitely weaker on second stand attempt, requiring mod-max +2. Plan for SNF remains appropriate at this time.       If plan is discharge home, recommend the following: A lot of help with bathing/dressing/bathroom;A little help with walking and/or transfers   Can travel by private vehicle        Equipment Recommendations  Rolling walker (2 wheels);BSC/3in1    Recommendations for Other Services       Precautions / Restrictions Precautions Precautions: Back Precaution Booklet Issued: Yes (comment) Recall of Precautions/Restrictions: Intact Required Braces or Orthoses: Spinal Brace Spinal Brace: Lumbar corset Other Brace: AFO for LLE (not donned this date) Restrictions Weight Bearing Restrictions Per Provider Order: No     Mobility  Bed Mobility Overal bed mobility: Needs Assistance Bed Mobility: Sidelying to Sit, Rolling Rolling: Mod assist Sidelying to sit: Mod assist, HOB elevated, Used rails       General bed mobility comments: assist for roll towards L and trunk elevation.    Transfers Overall transfer level: Needs assistance Equipment used: Rolling walker (2  wheels) Transfers: Sit to/from Stand, Bed to chair/wheelchair/BSC Sit to Stand: Mod assist, From elevated surface   Step pivot transfers: Mod assist, +2 safety/equipment       General transfer comment: assist for power up, rise, steadying, and pivot towards L. Cues for upright posture, placement in RW, and slow eccentric lower using at least 1 UE on recliner.    Ambulation/Gait               General Gait Details: unable given fatigue   Stairs             Wheelchair Mobility     Tilt Bed    Modified Rankin (Stroke Patients Only)       Balance Overall balance assessment: Needs assistance Sitting-balance support: Feet supported, Bilateral upper extremity supported Sitting balance-Leahy Scale: Poor Sitting balance - Comments: fair to poor, posterior bias with fatigue   Standing balance support: Bilateral upper extremity supported, During functional activity, Reliant on assistive device for balance Standing balance-Leahy Scale: Poor Standing balance comment: leans hevaily onto RW                            Communication Communication Communication: No apparent difficulties  Cognition Arousal: Alert Behavior During Therapy: WFL for tasks assessed/performed   PT - Cognitive impairments: No apparent impairments                         Following commands: Intact      Cueing Cueing Techniques: Verbal cues  Exercises General Exercises - Lower Extremity Long Arc Quad: AROM, Both, 10 reps, Seated Hip Flexion/Marching: AROM, Both, 10 reps, Seated    General Comments        Pertinent Vitals/Pain Pain Assessment Pain Assessment: Faces Faces Pain Scale: Hurts little more Pain Location: low back Pain Descriptors / Indicators: Operative site guarding, Grimacing, Guarding, Discomfort Pain Intervention(s): Limited activity within patient's tolerance, Monitored during session, Repositioned    Home Living                           Prior Function            PT Goals (current goals can now be found in the care plan section) Acute Rehab PT Goals Patient Stated Goal: return home safely PT Goal Formulation: With patient/family Time For Goal Achievement: 12/24/23 Potential to Achieve Goals: Good Progress towards PT goals: Progressing toward goals    Frequency    Min 5X/week      PT Plan      Co-evaluation              AM-PAC PT "6 Clicks" Mobility   Outcome Measure  Help needed turning from your back to your side while in a flat bed without using bedrails?: A Little Help needed moving from lying on your back to sitting on the side of a flat bed without using bedrails?: A Lot Help needed moving to and from a bed to a chair (including a wheelchair)?: A Lot Help needed standing up from a chair using your arms (e.g., wheelchair or bedside chair)?: A Lot Help needed to walk in hospital room?: A Lot Help needed climbing 3-5 steps with a railing? : Total 6 Click Score: 12    End of Session Equipment Utilized During Treatment: Back brace Activity Tolerance: Patient tolerated treatment well Patient left: in chair;with call bell/phone within reach;with chair alarm set;with family/visitor present Nurse Communication: Mobility status PT Visit Diagnosis: Unsteadiness on feet (R26.81);History of falling (Z91.81);Muscle weakness (generalized) (M62.81);Pain     Time: 1610-9604 PT Time Calculation (min) (ACUTE ONLY): 24 min  Charges:    $Therapeutic Activity: 23-37 mins PT General Charges $$ ACUTE PT VISIT: 1 Visit                     Shirlene Doughty, PT DPT Acute Rehabilitation Services Secure Chat Preferred  Office [phone]    Lamoyne Hessel E Burnadette Carrion 12/15/2023, 4:16 PM

## 2023-12-15 NOTE — Progress Notes (Signed)
 Patient ID: Norman Lambert, male   DOB: November 25, 1950, 73 y.o.   MRN: [id number] Much better night.  Looks much more comfortable.  Looks like the external VAC has 1 more day.  He is going to need to go to SNF and I have explained that to him.  Continue PT OT as tolerated.  X-rays yesterday looked okay.

## 2023-12-15 NOTE — Plan of Care (Signed)

## 2023-12-16 ENCOUNTER — Inpatient Hospital Stay (HOSPITAL_COMMUNITY)

## 2023-12-16 DIAGNOSIS — R6520 Severe sepsis without septic shock: Secondary | ICD-10-CM | POA: Insufficient documentation

## 2023-12-16 DIAGNOSIS — R6521 Severe sepsis with septic shock: Secondary | ICD-10-CM | POA: Diagnosis not present

## 2023-12-16 DIAGNOSIS — G9341 Metabolic encephalopathy: Secondary | ICD-10-CM

## 2023-12-16 DIAGNOSIS — A419 Sepsis, unspecified organism: Secondary | ICD-10-CM

## 2023-12-16 DIAGNOSIS — N400 Enlarged prostate without lower urinary tract symptoms: Secondary | ICD-10-CM

## 2023-12-16 DIAGNOSIS — E039 Hypothyroidism, unspecified: Secondary | ICD-10-CM | POA: Diagnosis not present

## 2023-12-16 LAB — CBC WITH DIFFERENTIAL/PLATELET
Abs Immature Granulocytes: 0.05 10*3/uL (ref 0.00–0.07)
Basophils Absolute: 0 10*3/uL (ref 0.0–0.1)
Basophils Relative: 0 %
Eosinophils Absolute: 0 10*3/uL (ref 0.0–0.5)
Eosinophils Relative: 0 %
HCT: 23.4 % — ABNORMAL LOW (ref 39.0–52.0)
Hemoglobin: 7.5 g/dL — ABNORMAL LOW (ref 13.0–17.0)
Immature Granulocytes: 1 %
Lymphocytes Relative: 2 %
Lymphs Abs: 0.2 10*3/uL — ABNORMAL LOW (ref 0.7–4.0)
MCH: 30.7 pg (ref 26.0–34.0)
MCHC: 32.1 g/dL (ref 30.0–36.0)
MCV: 95.9 fL (ref 80.0–100.0)
Monocytes Absolute: 0.3 10*3/uL (ref 0.1–1.0)
Monocytes Relative: 3 %
Neutro Abs: 9.2 10*3/uL — ABNORMAL HIGH (ref 1.7–7.7)
Neutrophils Relative %: 94 %
Platelets: 220 10*3/uL (ref 150–400)
RBC: 2.44 MIL/uL — ABNORMAL LOW (ref 4.22–5.81)
RDW: 13.3 % (ref 11.5–15.5)
WBC: 9.8 10*3/uL (ref 4.0–10.5)
nRBC: 0 % (ref 0.0–0.2)

## 2023-12-16 LAB — COMPREHENSIVE METABOLIC PANEL WITH GFR
ALT: 41 U/L (ref 0–44)
AST: 49 U/L — ABNORMAL HIGH (ref 15–41)
Albumin: 2.4 g/dL — ABNORMAL LOW (ref 3.5–5.0)
Alkaline Phosphatase: 89 U/L (ref 38–126)
Anion gap: 14 (ref 5–15)
BUN: 27 mg/dL — ABNORMAL HIGH (ref 8–23)
CO2: 22 mmol/L (ref 22–32)
Calcium: 8.2 mg/dL — ABNORMAL LOW (ref 8.9–10.3)
Chloride: 100 mmol/L (ref 98–111)
Creatinine, Ser: 0.94 mg/dL (ref 0.61–1.24)
GFR, Estimated: >60 mL/min (ref >60)
Glucose, Bld: 94 mg/dL (ref 70–99)
Potassium: 4.1 mmol/L (ref 3.5–5.1)
Sodium: 136 mmol/L (ref 135–145)
Total Bilirubin: 0.6 mg/dL (ref 0.0–1.2)
Total Protein: 5.3 g/dL — ABNORMAL LOW (ref 6.5–8.1)

## 2023-12-16 LAB — BLOOD GAS, ARTERIAL
Acid-Base Excess: 2.9 mmol/L — ABNORMAL HIGH (ref 0.0–2.0)
Bicarbonate: 25.7 mmol/L (ref 20.0–28.0)
O2 Saturation: 97.1 %
Patient temperature: 40
pCO2 arterial: 38 mmHg (ref 32–48)
pH, Arterial: 7.45 (ref 7.35–7.45)
pO2, Arterial: 86 mmHg (ref 83–108)

## 2023-12-16 LAB — BRAIN NATRIURETIC PEPTIDE: B Natriuretic Peptide: 274.3 pg/mL — ABNORMAL HIGH (ref 0.0–100.0)

## 2023-12-16 LAB — PREPARE RBC (CROSSMATCH)

## 2023-12-16 LAB — GLUCOSE, CAPILLARY
Glucose-Capillary: 89 mg/dL (ref 70–99)
Glucose-Capillary: 90 mg/dL (ref 70–99)

## 2023-12-16 LAB — PROTIME-INR
INR: 1.1 (ref 0.8–1.2)
Prothrombin Time: 14.8 s (ref 11.4–15.2)

## 2023-12-16 LAB — URINALYSIS, ROUTINE W REFLEX MICROSCOPIC
Bilirubin Urine: NEGATIVE
Glucose, UA: NEGATIVE mg/dL
Hgb urine dipstick: NEGATIVE
Ketones, ur: NEGATIVE mg/dL
Leukocytes,Ua: NEGATIVE
Nitrite: NEGATIVE
Protein, ur: NEGATIVE mg/dL
Specific Gravity, Urine: 1.015 (ref 1.005–1.030)
pH: 5 (ref 5.0–8.0)

## 2023-12-16 LAB — C-REACTIVE PROTEIN: CRP: 20 mg/dL — ABNORMAL HIGH (ref <1.0)

## 2023-12-16 LAB — PROCALCITONIN: Procalcitonin: 11.49 ng/mL

## 2023-12-16 LAB — LACTIC ACID, PLASMA
Lactic Acid, Venous: 2.4 mmol/L (ref 0.5–1.9)
Lactic Acid, Venous: 1.3 mmol/L (ref 0.5–1.9)

## 2023-12-16 LAB — APTT: aPTT: 23 s — ABNORMAL LOW (ref 24–36)

## 2023-12-16 MED ORDER — VANCOMYCIN HCL 2000 MG/400ML IV SOLN
2000.0000 mg | Freq: Once | INTRAVENOUS | Status: AC
  Administered 2023-12-16: 2000 mg via INTRAVENOUS
  Filled 2023-12-16: qty 400

## 2023-12-16 MED ORDER — SODIUM CHLORIDE 0.9% IV SOLUTION: Freq: Once | INTRAVENOUS | Status: AC

## 2023-12-16 MED ORDER — SODIUM CHLORIDE 0.9 % IV BOLUS
1000.0000 mL | Freq: Once | INTRAVENOUS | Status: AC
  Administered 2023-12-16: 1000 mL via INTRAVENOUS

## 2023-12-16 MED ORDER — SODIUM CHLORIDE 0.9 % IV SOLN
2.0000 g | Freq: Three times a day (TID) | INTRAVENOUS | Status: DC
  Administered 2023-12-16 – 2023-12-17 (×3): 2 g via INTRAVENOUS
  Filled 2023-12-16 (×3): qty 12.5

## 2023-12-16 MED ORDER — VANCOMYCIN HCL 1500 MG/300ML IV SOLN
1500.0000 mg | INTRAVENOUS | Status: DC
  Administered 2023-12-17: 1500 mg via INTRAVENOUS
  Filled 2023-12-16: qty 300

## 2023-12-16 MED ORDER — LACTATED RINGERS IV SOLN: INTRAVENOUS | Status: AC

## 2023-12-16 MED ORDER — SODIUM CHLORIDE 0.9 % IV SOLN: 250.0000 mL | INTRAVENOUS | Status: AC

## 2023-12-16 MED ORDER — SODIUM CHLORIDE 0.9 % IV BOLUS: 500.0000 mL | Freq: Once | INTRAVENOUS | Status: DC | PRN

## 2023-12-16 MED ORDER — BISACODYL 10 MG RE SUPP
10.0000 mg | Freq: Every day | RECTAL | Status: AC
  Administered 2023-12-16: 10 mg via RECTAL
  Filled 2023-12-16: qty 1

## 2023-12-16 MED ORDER — ACETAMINOPHEN 325 MG PO TABS
650.0000 mg | ORAL_TABLET | ORAL | Status: DC | PRN
  Administered 2023-12-16 – 2023-12-17 (×2): 650 mg via ORAL
  Filled 2023-12-16 (×3): qty 2

## 2023-12-16 MED ORDER — CHLORHEXIDINE GLUCONATE CLOTH 2 % EX PADS
6.0000 | MEDICATED_PAD | Freq: Every day | CUTANEOUS | Status: DC
  Administered 2023-12-16 – 2024-01-16 (×32): 6 via TOPICAL

## 2023-12-16 MED ORDER — BISACODYL 10 MG RE SUPP
10.0000 mg | Freq: Every day | RECTAL | Status: DC | PRN
  Filled 2023-12-16: qty 1

## 2023-12-16 MED ORDER — NOREPINEPHRINE 4 MG/250ML-% IV SOLN
2.0000 ug/min | INTRAVENOUS | Status: DC
Start: 2023-12-16 — End: 2023-12-17
  Administered 2023-12-16: 4 ug/min via INTRAVENOUS
  Filled 2023-12-16: qty 250

## 2023-12-16 MED ORDER — ACETAMINOPHEN 650 MG RE SUPP
650.0000 mg | RECTAL | Status: DC | PRN
  Administered 2023-12-16: 650 mg via RECTAL
  Filled 2023-12-16: qty 1

## 2023-12-16 MED ORDER — IOHEXOL 350 MG/ML SOLN
75.0000 mL | Freq: Once | INTRAVENOUS | Status: AC | PRN
  Administered 2023-12-16: 75 mL via INTRAVENOUS

## 2023-12-16 MED ORDER — BISACODYL 10 MG RE SUPP: 10.0000 mg | Freq: Two times a day (BID) | RECTAL | Status: DC

## 2023-12-16 MED ORDER — BISACODYL 10 MG RE SUPP: 10.0000 mg | Freq: Every day | RECTAL | Status: DC | PRN

## 2023-12-16 NOTE — Progress Notes (Signed)
 Continue to communicate with bedside related to labs for sepsis protocol.

## 2023-12-16 NOTE — Consult Note (Signed)
 NAME:  SURIEL CALVERLEY, MRN:  [id number], DOB:  02/08/1951, LOS: 11 ADMISSION DATE:  12/05/2023, CONSULTATION DATE:  5/3 REFERRING MD:  [email or web address], CHIEF COMPLAINT:  septic shock    History of Present Illness:  73 year old male patient who was admitted on 4/25 for reduction and internal fixation of previous fusions involving L2-S1. Postoperative course initially seemed unremarkable pain had improved still not able to walk as of 4/29 on 4/30 began to have new increased pain involving the left foot, on 5/1 early in the morning continued to report 10 out of 10 pain seemingly not responding to as needed narcotics.  Imaging of the back was unremarkable, he continued to have drain VAC in place in the evening hours of 5/2 began to have borderline hypotension with tachycardia 70 have shakes and tremors in the evening, temperature spike to 103 was started on IV cefepime and vancomycin  with concern about sepsis white blood cell count elevated to 17,000 there was significant wound erythema with serosanguineous foul-smelling drainage from the surgical site.  Initially internal medicine was consulted but after administering about a liter of crystalloid the patient remained hypotensive in the 70s so critical care was asked to evaluate for possible transfer to the intensive care  Pertinent  Medical History  Hypertension, GERD, gout, hyperlipidemia,-hypothyroidism peripheral vascular disease, sleep apnea, right bundle branch block prior lumbar back surgery  Significant Hospital Events: Including procedures, antibiotic start and stop dates in addition to other pertinent events   4/25 extensive L2-S1 lumbar surgery 4/30 new onset pain  5/1 ongoing pain borderline blood pressure 5/2 pain leukocytosis fever, 5/3 hypotension delirium, not responding to crystalloid started on broad-spectrum antibiotics wound assessed by surgical team culture sent imaging pending ICU transfer  Interim History / Subjective:  Awake no acute  distress does report left hip and back pain Objective   Blood pressure 90/72, pulse 87, temperature (!) 102.3 F (39.1 C), temperature source Axillary, resp. rate 16, height 6' (1.829 m), weight 101.6 kg, SpO2 97%.        Intake/Output Summary (Last 24 hours) at 12/16/2023 1208 Last data filed at 12/15/2023 2000 Gross per 24 hour  Intake 480 ml  Output 150 ml  Net 330 ml   Filed Weights   12/06/23 1300 12/08/23 0957  Weight: 103 kg 101.6 kg    Examination: General: 73 year old male patient lying in bed no acute distress currently HENT: Mucous membranes moist, no JVD pupils equal reactive Lungs: Does have some scattered rhonchi currently no accessory use currently on 2 L/min Cardiovascular: Tachycardic regular rhythm Abdomen: Soft not tender Extremities: Warm dry pulses strong brisk cap refill has chronic left lower extremity foot drop Neuro: Awake oriented x 2-3, intermittently moves all extremities at baseline function no obvious focal deficits GU: Due to void  Resolved Hospital Problem list     Assessment & Plan:  Septic shock acidosis secondary to surgical wound site infection - Not clear at this point if this is primary superficial or much more involved - Fluid resuscitation currently Plan Complete fluid resuscitation efforts Hold any and all antihypertensives and diuretics Follow-up lactic acid Agree with IV vancomycin  and cefepime today is day 1 Wound culture has been obtained and is sent we will follow Start norepinephrine peripherally with MAP goal greater than 65, low threshold for central access to determine CVP and facilitate resuscitation efforts Further imaging pending per neurosurgical team, keep n.p.o. in case he needs to go to surgery for further exploration and washout  Status post redo  of laminectomy L3-L4 with posterior fixation of L2-S1 Plan Continuing postoperative pain management, careful with narcotics given hypotension Wound/drain management per  neurosurgical team  Acute metabolic encephalopathy secondary sepsis Plan Supportive care Treat infection  Postoperative anemia.  Has slowly drifted from baseline of 12.5-7.5 given shock state agree with transfusion Plan Transfuse 1 unit Trend CBC Hold anticoagulation  History of hypertension, hyperlipidemia, and peripheral vascular disease Plan Holding his Norvasc  given hypotension, hold this hydrochlorothiazide  hold Diovan Resume Crestor  when he can safely take p.o.'s  Hypothyroidism Plan Synthroid   BPH Plan Can continue to take his finasteride  and Flomax  as able  History of GERD Plan PPI Best Practice (right click and "Reselect all SmartList Selections" daily)   Diet/type: NPO w/ oral meds DVT prophylaxis SCD Pressure ulcer(s): N/A GI prophylaxis: PPI Lines: N/A Foley:  N/A Code Status:  full code Last date of multidisciplinary goals of care discussion [pending]  Labs   CBC: Recent Labs  Lab 12/12/23 0904 12/15/23 2307 12/16/23 1049  WBC 7.4 17.0* 9.8  NEUTROABS  --   --  9.2*  HGB 8.0* 7.5* 7.5*  HCT 24.6* 22.2* 23.4*  MCV 93.9 92.5 95.9  PLT 189 250 220    Basic Metabolic Panel: Recent Labs  Lab 12/15/23 2307  NA 129*  K 3.5  CL 96*  CO2 21*  GLUCOSE 142*  BUN 29*  CREATININE 1.03  CALCIUM  7.7*   GFR: Estimated Creatinine Clearance: 77.6 mL/min (by C-G formula based on SCr of 1.03 mg/dL). Recent Labs  Lab 12/12/23 0904 12/15/23 2307 12/16/23 1049  WBC 7.4 17.0* 9.8  LATICACIDVEN  --   --  2.4*    Liver Function Tests: No results for input(s): "AST", "ALT", "ALKPHOS", "BILITOT", "PROT", "ALBUMIN " in the last 168 hours. No results for input(s): "LIPASE", "AMYLASE" in the last 168 hours. No results for input(s): "AMMONIA" in the last 168 hours.  ABG    Component Value Date/Time   PHART 7.45 12/16/2023 0955   PCO2ART 38 12/16/2023 0955   PO2ART 86 12/16/2023 0955   HCO3 25.7 12/16/2023 0955   TCO2 29 12/08/2023 1410   O2SAT  97.1 12/16/2023 0955     Coagulation Profile: Recent Labs  Lab 12/16/23 1049  INR 1.1    Cardiac Enzymes: No results for input(s): "CKTOTAL", "CKMB", "CKMBINDEX", "TROPONINI" in the last 168 hours.  HbA1C: No results found for: "HGBA1C"  CBG: Recent Labs  Lab 12/10/23 0634 12/11/23 0631 12/15/23 1953 12/16/23 0923  GLUCAP 137* 86 124* 89    Review of Systems:   Confused not able   Past Medical History:  He,  has a past medical history of Abnormal EKG, Arthritis, Essential hypertension (01/02/2017), GERD (gastroesophageal reflux disease), Gout, Hyperlipidemia (01/02/2017), Hypertension, Hypothyroidism, Peripheral vascular disease (HCC), RBBB (01/02/2017), Rotator cuff tear, and Sleep apnea.   Surgical History:   Past Surgical History:  Procedure Laterality Date   APPENDECTOMY     CATARACT EXTRACTION Bilateral    EYE SURGERY     8 years ago   FEMORAL ENDARTERECTOMY Bilateral    done 15 yrs. ago at [facility]   JOINT REPLACEMENT     LAMINECTOMY WITH POSTERIOR LATERAL ARTHRODESIS LEVEL 4 N/A 12/08/2023   Procedure: Lumbar Two - Sacral Two Revision - Extension of Fusion with O-Arm;  Surgeon: Joaquin Mulberry, MD;  Location: [facility] OR;  Service: Neurosurgery;  Laterality: N/A;  L2 - S2 Revision - Extension of Fusion with O-Arm   LUMBAR LAMINECTOMY/DECOMPRESSION MICRODISCECTOMY Left 04/23/2020   Procedure: MICRO  LUMBER DECOMPRESSION LUMBAR FOUR-SACRAL ONE ON LEFT AND FORAMINOTOMY LUMBAR FIVE LEFT;  Surgeon: Orvan Blanch, MD;  Location: [facility] OR;  Service: Orthopedics;  Laterality: Left;  posterior   REVERSE SHOULDER ARTHROPLASTY Right 12/16/2016   Procedure: RIGHT REVERSE TOTAL SHOULDER ARTHROPLASTY;  Surgeon: Winston Hawking, MD;  Location: [facility] OR;  Service: Orthopedics;  Laterality: Right;   SEPTOPLASTY     TONSILLECTOMY       Social History:   reports that he quit smoking about 37 years ago. His smoking use included cigarettes. He started smoking about 57 years ago. He has a 60  pack-year smoking history. He has never used smokeless tobacco. He reports current alcohol use. He reports that he does not use drugs.   Family History:  His family history is not on file.   Allergies Allergies  Allergen Reactions   Benzoic Acid Hives and Rash    Positive Patch Test 11/12/20   Imidurea Rash    Positive Patch Test 11/12/20   Urea  Rash    Positive Patch Test 11/12/20   Other Rash and Other (See Comments)    " DARK DYES " [ UNSPECIFIED DYE ]     Home Medications  Prior to Admission medications   Medication Sig Start Date End Date Taking? Authorizing Provider  amLODipine  (NORVASC ) 10 MG tablet Take 1 tablet by mouth daily. 04/20/23  Yes [provider]  Ascorbic Acid  (VITAMIN C ) 1000 MG tablet Take 1,000 mg by mouth daily.   Yes [provider]  Cholecalciferol (VITAMIN D-3) 125 MCG (5000 UT) TABS Take 5,000 Units by mouth in the morning and at bedtime.   Yes [provider]  finasteride  (PROSCAR ) 5 MG tablet Take 5 mg by mouth daily.   Yes [provider]  fluticasone (FLONASE) 50 MCG/ACT nasal spray Place 1-2 sprays into both nostrils as needed for allergies. 09/10/21  Yes [provider]  hydrochlorothiazide  (HYDRODIURIL ) 25 MG tablet Take 25 mg by mouth daily.   Yes [provider]  levocetirizine (XYZAL ) 5 MG tablet Take 5 mg by mouth at bedtime.    Yes [provider]  levothyroxine  (SYNTHROID , LEVOTHROID) 112 MCG tablet Take 112 mcg by mouth daily before breakfast.   Yes [provider]  modafinil  (PROVIGIL ) 200 MG tablet Take 200 mg by mouth daily.   Yes [provider]  Multiple Vitamin (MULTIVITAMIN WITH MINERALS) TABS tablet Take 1 tablet by mouth daily. One A Day for Men 50+   Yes [provider]  omeprazole  (PRILOSEC  OTC) 20 MG tablet Take 20 mg by mouth daily before breakfast.    Yes [provider]  oxyCODONE  (OXYCONTIN ) 40 mg 12 hr tablet Take 40 mg by mouth every  12 (twelve) hours.   Yes [provider]  predniSONE  (DELTASONE ) 5 MG tablet Take 15 mg by mouth daily.   Yes [provider]  Probiotic Product (PROBIOTIC COLON SUPPORT PO) Take 1 capsule by mouth daily in the afternoon. Garden of Life   Yes [provider]  rosuvastatin  (CRESTOR ) 5 MG tablet Take 1 tablet by mouth daily. 04/20/23  Yes [provider]  tacrolimus  (PROTOPIC ) 0.1 % ointment Apply 1 Application topically as needed (irritation). 01/06/22  Yes [provider]  tamsulosin  (FLOMAX ) 0.4 MG CAPS capsule Take 0.8 mg by mouth daily.   Yes [provider]  triamcinolone  ointment (KENALOG ) 0.1 % Apply 1 application topically in the morning and at bedtime. 03/10/20  Yes [provider]  valsartan (DIOVAN) 80  MG tablet Take 80 mg by mouth daily. 02/09/20  Yes [provider]  vitamin B-12 (CYANOCOBALAMIN ) 1000 MCG tablet Take 1,000 mcg by mouth daily.   Yes [provider]  Zinc  50 MG CAPS Take 50 mg by mouth daily. In the morning   Yes [provider]  folic acid  (FOLVITE ) 1 MG tablet Take 1 mg by mouth daily. Patient not taking: Reported on 12/06/2023    [provider]  methotrexate (RHEUMATREX) 2.5 MG tablet Take 15 mg by mouth every Saturday. 6 tablets once a week Patient not taking: Reported on 11/03/2023 03/17/20   [provider]  oxyCODONE  (OXY IR/ROXICODONE ) 5 MG immediate release tablet Take 1 tablet (5 mg total) by mouth every 4 (four) hours as needed for moderate pain ((score 4 to 6)). Patient not taking: Reported on 12/06/2023 04/24/20   Bissell, Jaclyn M, PA-C     Critical care time: 32 min

## 2023-12-16 NOTE — Plan of Care (Signed)
  Problem: Pain Management: Goal: Pain level will decrease Outcome: Progressing   Problem: Skin Integrity: Goal: Will show signs of wound healing Outcome: Progressing   Problem: Health Behavior/Discharge Planning: Goal: Identification of resources available to assist in meeting health care needs will improve Outcome: Progressing   Problem: Bladder/Genitourinary: Goal: Urinary functional status for postoperative course will improve Outcome: Progressing

## 2023-12-16 NOTE — Progress Notes (Signed)
   12/16/23 0753  Assess: MEWS Score  Temp (!) 103 F (39.4 C)  BP (!) 97/51  MAP (mmHg) 67  Pulse Rate (!) 102  Resp 18  SpO2 90 %  O2 Device Room Air  Assess: MEWS Score  MEWS Temp 2  MEWS Systolic 1  MEWS Pulse 1  MEWS RR 0  MEWS LOC 0  MEWS Score 4  MEWS Score Color Red  Assess: if the MEWS score is Yellow or Red  Were vital signs accurate and taken at a resting state? Yes  Does the patient meet 2 or more of the SIRS criteria? Yes  Does the patient have a confirmed or suspected source of infection? Yes  Notify: Charge Nurse/RN  Name of Charge Nurse/RN Notified Freight forwarder Name/Title  (Neurosurgery office called)  Date Provider Notified 12/16/23  Time Provider Notified 0801  Method of Notification Call  Notification Reason Change in status  Notify: Rapid Response  Name of Rapid Response RN Notified Helle RN  Date Rapid Response Notified 12/16/23  Time Rapid Response Notified 0804  Assess: SIRS CRITERIA  SIRS Temperature  1  SIRS Respirations  0  SIRS Pulse 1  SIRS WBC 1  SIRS Score Sum  3

## 2023-12-16 NOTE — Progress Notes (Signed)
 Pharmacy Antibiotic Note  Norman Lambert is a 73 y.o. male admitted on 12/05/2023 with sepsis. Pt s/p 12/08/23 redo of laminectomy L3-4 with posterior fixation L2-S1. PMH gout, arthritis, HTN, HLD, HTN, PVD, RBBB, R reverse shoulder, sleep apnea, 11/13/23 PLIF L3-4. Pharmacy has been consulted for cefepime and vancomycin  dosing for sepsis.   Concerns of sepsis/sepsis consult placed. WBC 17.0, T104.2, blood pressure low. Serum creatinine slightly elevated at 1.03 yesterday from baseline of ~0.8. Neurosurgery to consult medicine service for management.  Plan: Start cefepime 2 g IV q8h Start vancomycin  2000 mg IV load, then 1500 mg IV q24h (eAUC 478.3, IBW for CrCl, 0.5 for Vd coefficient d/t BMI >30). Follow up blood cultures, MRSA swab, renal function, and infectious workup.  Height: 6' (182.9 cm) Weight: 101.6 kg (224 lb) IBW/kg (Calculated) : 77.6  Temp (24hrs), Avg:100.1 F (37.8 C), Min:97.6 F (36.4 C), Max:104.2 F (40.1 C)  Recent Labs  Lab 12/12/23 0904 12/15/23 2307  WBC 7.4 17.0*  CREATININE  --  1.03    Estimated Creatinine Clearance: 77.6 mL/min (by C-G formula based on SCr of 1.03 mg/dL).    Allergies  Allergen Reactions   Benzoic Acid Hives and Rash    Positive Patch Test 11/12/20   Imidurea Rash    Positive Patch Test 11/12/20   Urea  Rash    Positive Patch Test 11/12/20   Other Rash and Other (See Comments)    " DARK DYES " [ UNSPECIFIED DYE ]    Antimicrobials this admission: Cefazolin  4/25 >> 4/26 (surgical prophylaxis) Vancomycin  5/3 >>  Cefepime 5/3 >>   Dose adjustments this admission: N/a  Microbiology results: 5/3 BCx: IP 4/24 MRSA PCR: negative 5/3 MRSA PCR: ordered  Thank you for allowing pharmacy to be a part of this patient's care.  Estela Held, PharmD PGY-2 Infectious Diseases Pharmacy Resident [facility] for Infectious Disease 12/16/2023 9:28 AM

## 2023-12-16 NOTE — Consult Note (Signed)
 Patient Demographics  Norman Lambert, is a 73 y.o. male   MRN: [id number]   DOB - [birth year]  Admit Date - 12/05/2023    Outpatient Primary MD for the patient is Patient, No Pcp Per  Consult requested in the Hospital by Rochelle Chu Otelia Blew, MD, On 12/16/2023    Reason for consult   Fever  With History of -  Past Medical History:  Diagnosis Date   Abnormal EKG    Arthritis    Essential hypertension 01/02/2017   GERD (gastroesophageal reflux disease)    Gout    Hyperlipidemia 01/02/2017   Hypertension    Hypothyroidism    Peripheral vascular disease (HCC)    RBBB 01/02/2017   Rotator cuff tear    Sleep apnea       Past Surgical History:  Procedure Laterality Date   APPENDECTOMY     CATARACT EXTRACTION Bilateral    EYE SURGERY     8 years ago   FEMORAL ENDARTERECTOMY Bilateral    done 15 yrs. ago at [facility]   JOINT REPLACEMENT     LAMINECTOMY WITH POSTERIOR LATERAL ARTHRODESIS LEVEL 4 N/A 12/08/2023   Procedure: Lumbar Two - Sacral Two Revision - Extension of Fusion with O-Arm;  Surgeon: Joaquin Mulberry, MD;  Location: [facility] OR;  Service: Neurosurgery;  Laterality: N/A;  L2 - S2 Revision - Extension of Fusion with O-Arm   LUMBAR LAMINECTOMY/DECOMPRESSION MICRODISCECTOMY Left 04/23/2020   Procedure: MICRO LUMBER DECOMPRESSION LUMBAR FOUR-SACRAL ONE ON LEFT AND FORAMINOTOMY LUMBAR FIVE LEFT;  Surgeon: Orvan Blanch, MD;  Location: [facility] OR;  Service: Orthopedics;  Laterality: Left;  posterior   REVERSE SHOULDER ARTHROPLASTY Right 12/16/2016   Procedure: RIGHT REVERSE TOTAL SHOULDER ARTHROPLASTY;  Surgeon: Winston Hawking, MD;  Location: [facility] OR;  Service: Orthopedics;  Laterality: Right;   SEPTOPLASTY     TONSILLECTOMY      in for   No chief complaint on file.    HPI  Norman Lambert  is a 73 y.o. male, with past medical history of hypertension, gout, hyperlipidemia, hypothyroidism, peripheral  vascular disease, sleep apnea, patient was admitted 12/08/2023, significant pain for Recurrent L3-4 spinal stenosis, large right L3-4 distribution with large inferior free fragment, right L3-4 synovial cyst impressing the right L4 nerve root, back pain with right leg pain and progressive leg weakness,, patient had decompressive surgery initially on 11/13/2023, he went for redo decompressive laminectomy L3-4 on 12/08/2023. - Overall patient has been improving, but over the last 48 hours he started to develop some confusion, altered mental status, and low blood pressure readings, patient over the last 24 hours developed fever 104.2, increased confusion, labs showing significant leukocytosis, and anemia, blood pressure has been dropping, patient had increased purulent secretions from wound VAC, so [facility] hospitalist has been consulted.   Review of Systems    A full 10 point Review of Systems was done, except as stated above, all other Review of Systems were negative.   Social History Social History   Tobacco Use  Smoking status: Former    Current packs/day: 0.00    Average packs/day: 3.0 packs/day for 20.0 years (60.0 ttl pk-yrs)    Types: Cigarettes    Start date: 12/09/1966    Quit date: 12/09/1986    Years since quitting: 37.0   Smokeless tobacco: Never  Substance Use Topics   Alcohol use: Yes    Comment: social    Family History History reviewed. No pertinent family history.   Prior to Admission medications   Medication Sig Start Date End Date Taking? Authorizing Provider  amLODipine  (NORVASC ) 10 MG tablet Take 1 tablet by mouth daily. 04/20/23  Yes [provider]  Ascorbic Acid  (VITAMIN C ) 1000 MG tablet Take 1,000 mg by mouth daily.   Yes [provider]  Cholecalciferol (VITAMIN D-3) 125 MCG (5000 UT) TABS Take 5,000 Units by mouth in the morning and at bedtime.   Yes [provider]  finasteride  (PROSCAR ) 5 MG tablet Take 5 mg by mouth daily.   Yes  [provider]  fluticasone (FLONASE) 50 MCG/ACT nasal spray Place 1-2 sprays into both nostrils as needed for allergies. 09/10/21  Yes [provider]  hydrochlorothiazide  (HYDRODIURIL ) 25 MG tablet Take 25 mg by mouth daily.   Yes [provider]  levocetirizine (XYZAL ) 5 MG tablet Take 5 mg by mouth at bedtime.    Yes [provider]  levothyroxine  (SYNTHROID , LEVOTHROID) 112 MCG tablet Take 112 mcg by mouth daily before breakfast.   Yes [provider]  modafinil  (PROVIGIL ) 200 MG tablet Take 200 mg by mouth daily.   Yes [provider]  Multiple Vitamin (MULTIVITAMIN WITH MINERALS) TABS tablet Take 1 tablet by mouth daily. One A Day for Men 50+   Yes [provider]  omeprazole  (PRILOSEC  OTC) 20 MG tablet Take 20 mg by mouth daily before breakfast.    Yes [provider]  oxyCODONE  (OXYCONTIN ) 40 mg 12 hr tablet Take 40 mg by mouth every 12 (twelve) hours.   Yes [provider]  predniSONE  (DELTASONE ) 5 MG tablet Take 15 mg by mouth daily.   Yes [provider]  Probiotic Product (PROBIOTIC COLON SUPPORT PO) Take 1 capsule by mouth daily in the afternoon. Garden of Life   Yes [provider]  rosuvastatin  (CRESTOR ) 5 MG tablet Take 1 tablet by mouth daily. 04/20/23  Yes [provider]  tacrolimus  (PROTOPIC ) 0.1 % ointment Apply 1 Application topically as needed (irritation). 01/06/22  Yes [provider]  tamsulosin  (FLOMAX ) 0.4 MG CAPS capsule Take 0.8 mg by mouth daily.   Yes [provider]  triamcinolone  ointment (KENALOG ) 0.1 % Apply 1 application topically in the morning and at bedtime. 03/10/20  Yes [provider]  valsartan (DIOVAN) 80 MG tablet Take 80 mg by mouth daily. 02/09/20  Yes [provider]  vitamin B-12 (CYANOCOBALAMIN ) 1000 MCG tablet Take 1,000 mcg by mouth daily.   Yes [provider]  Zinc  50 MG CAPS Take 50 mg by mouth  daily. In the morning   Yes [provider]  folic acid  (FOLVITE ) 1 MG tablet Take 1 mg by mouth daily. Patient not taking: Reported on 12/06/2023    [provider]  methotrexate (RHEUMATREX) 2.5 MG tablet Take 15 mg by mouth every Saturday. 6 tablets once a week Patient not taking: Reported on 11/03/2023 03/17/20   [provider]  oxyCODONE  (OXY IR/ROXICODONE ) 5 MG immediate release tablet Take 1 tablet (5 mg total) by mouth every 4 (  four) hours as needed for moderate pain ((score 4 to 6)). Patient not taking: Reported on 12/06/2023 04/24/20   Bissell, Jaclyn M, PA-C    Anti-infectives (From admission, onward)    Start     Dose/Rate Route Frequency Ordered Stop   12/17/23 1030  vancomycin  (VANCOREADY) IVPB 1500 mg/300 mL       Placed in "Followed by" Linked Group   1,500 mg 150 mL/hr over 120 Minutes Intravenous Every 24 hours 12/16/23 0920     12/16/23 1030  vancomycin  (VANCOREADY) IVPB 2000 mg/400 mL       Placed in "Followed by" Linked Group   2,000 mg 200 mL/hr over 120 Minutes Intravenous  Once 12/16/23 0920     12/16/23 1015  ceFEPIme (MAXIPIME) 2 g in sodium chloride  0.9 % 100 mL IVPB        2 g 200 mL/hr over 30 Minutes Intravenous Every 8 hours 12/16/23 0920     12/08/23 1900  ceFAZolin  (ANCEF ) IVPB 2g/100 mL premix        2 g 200 mL/hr over 30 Minutes Intravenous Every 8 hours 12/08/23 1721 12/09/23 0452   12/08/23 1015  ceFAZolin  (ANCEF ) IVPB 2g/100 mL premix        2 g 200 mL/hr over 30 Minutes Intravenous On call to O.R. 12/08/23 1000 12/08/23 1105   12/08/23 0952  ceFAZolin  (ANCEF ) 2-4 GM/100ML-% IVPB       Note to Pharmacy: Noelle Batten M: cabinet override      12/08/23 0952 12/08/23 1105       Scheduled Meds:  sodium chloride    Intravenous Once   vitamin C   1,000 mg Oral Daily   bisacodyl   10 mg Rectal BID   cyanocobalamin   1,000 mcg Oral Daily   enoxaparin  (LOVENOX ) injection  50 mg Subcutaneous Q24H   feeding supplement  237 mL  Oral BID BM   finasteride   5 mg Oral Daily   folic acid   1 mg Oral Daily   gabapentin   300 mg Oral TID   levothyroxine   112 mcg Oral Q0600   loratadine   10 mg Oral Daily   modafinil   200 mg Oral Daily   oxyCODONE   10 mg Oral Q12H   pantoprazole   40 mg Oral Daily   senna  1 tablet Oral BID   sodium chloride  flush  3 mL Intravenous Q12H   tamsulosin   0.8 mg Oral Daily   zinc  sulfate (50mg  elemental zinc )  220 mg Oral Daily   Continuous Infusions:  ceFEPime (MAXIPIME) IV 2 g (12/16/23 1109)   lactated ringers      sodium chloride      vancomycin      Followed by   [name] ON 12/17/2023] vancomycin      PRN Meds:.acetaminophen  **OR** acetaminophen , bisacodyl , HYDROmorphone  (DILAUDID ) injection, menthol -cetylpyridinium **OR** phenol, methocarbamol  **OR** methocarbamol  (ROBAXIN ) injection, ondansetron  **OR** ondansetron  (ZOFRAN ) IV, oxyCODONE , sodium chloride , sodium chloride  flush  Allergies  Allergen Reactions   Benzoic Acid Hives and Rash    Positive Patch Test 11/12/20   Imidurea Rash    Positive Patch Test 11/12/20   Urea  Rash    Positive Patch Test 11/12/20   Other Rash and Other (See Comments)    " DARK DYES " [ UNSPECIFIED DYE ]    Physical Exam  Vitals  Blood pressure (!) 89/40, pulse 97, temperature (!) 104.2 F (40.1 C), temperature source Rectal, resp. rate (!) 21, height 6' (1.829 m), weight 101.6 kg, SpO2 (P) 100%.   1. General Frail, appearing male,  laying in bed in mild discomfort  2.  Awake, but confused, oriented x 1  3. No F.N deficits, ALL C.Nerves Intact, Strength 5/5 all 4 extremities, Sensation intact all 4 extremities, Plantars down going.  4. Ears and Eyes appear Normal, Conjunctivae clear, PERRLA. Moist Oral Mucosa.  5. Supple Neck, No JVD, No cervical lymphadenopathy appriciated, No Carotid Bruits.  6. Symmetrical Chest wall movement, Good air movement bilaterally, CTAB.  7. RRR, No Gallops, Rubs or Murmurs, No Parasternal Heave.  8. Positive Bowel  Sounds, Abdomen Soft, No tenderness, No organomegaly appriciated,No rebound -guarding or rigidity.  9.  No Cyanosis, Normal Skin Turgor, No Skin Rash or Bruise.  10.  joints appear normal , no effusions, Normal ROM.  Surgical back wound with wound VAC, wound VAC seal appears to be loosened, with some discharge, sanguinous, wound VAC appears to be a significant amount of sanguinous output, please see pictures below.             Data Review  CBC Recent Labs  Lab 12/12/23 0904 12/15/23 2307  WBC 7.4 17.0*  HGB 8.0* 7.5*  HCT 24.6* 22.2*  PLT 189 250  MCV 93.9 92.5  MCH 30.5 31.3  MCHC 32.5 33.8  RDW 12.9 13.2   ------------------------------------------------------------------------------------------------------------------  Chemistries  Recent Labs  Lab 12/15/23 2307  NA 129*  K 3.5  CL 96*  CO2 21*  GLUCOSE 142*  BUN 29*  CREATININE 1.03  CALCIUM  7.7*   ------------------------------------------------------------------------------------------------------------------ estimated creatinine clearance is 77.6 mL/min (by C-G formula based on SCr of 1.03 mg/dL). ------------------------------------------------------------------------------------------------------------------ No results for input(s): "TSH", "T4TOTAL", "T3FREE", "THYROIDAB" in the last 72 hours.  Invalid input(s): "FREET3"   Coagulation profile No results for input(s): "INR", "PROTIME" in the last 168 hours. ------------------------------------------------------------------------------------------------------------------- No results for input(s): "DDIMER" in the last 72 hours. -------------------------------------------------------------------------------------------------------------------  Cardiac Enzymes No results for input(s): "CKMB", "TROPONINI", "MYOGLOBIN" in the last 168 hours.  Invalid input(s):  "CK" ------------------------------------------------------------------------------------------------------------------ Invalid input(s): "POCBNP"   ---------------------------------------------------------------------------------------------------------------  Urinalysis    Component Value Date/Time   COLORURINE YELLOW 12/16/2023 0920   APPEARANCEUR HAZY (A) 12/16/2023 0920   LABSPEC 1.015 12/16/2023 0920   PHURINE 5.0 12/16/2023 0920   GLUCOSEU NEGATIVE 12/16/2023 0920   HGBUR NEGATIVE 12/16/2023 0920   BILIRUBINUR NEGATIVE 12/16/2023 0920   KETONESUR NEGATIVE 12/16/2023 0920   PROTEINUR NEGATIVE 12/16/2023 0920   NITRITE NEGATIVE 12/16/2023 0920   LEUKOCYTESUR NEGATIVE 12/16/2023 0920     Imaging results:   DG CHEST PORT 1 VIEW Result Date: 12/16/2023 CLINICAL DATA:  Fevers. Status post L2 through S2 revision and sepsis. Fevers of 104.2. EXAM: PORTABLE CHEST 1 VIEW COMPARISON:  05/06/2005. FINDINGS: Cardiomediastinal contours are unremarkable. Lungs appear hypoinflated. No pleural fluid, interstitial edema or consolidative change. Visualized osseous structures appear intact. IMPRESSION: Low lung volumes. No acute findings. Electronically Signed   By: Kimberley Penman M.D.   On: 12/16/2023 10:00        Assessment & Plan  Principal Problem:   Back pain Active Problems:   S/P lumbar fusion   Septic shock -This is most likely due to surgical wound infection, urinalysis with no evidence of infection, chest x-ray with no acute process as well. - Pressure low despite IV fluid resuscitation, ICU team consulted, where was started on pressor support, Levophed. - Patient will be started empirically on broad-spectrum antibiotic, vancomycin  and cefepime - Follow-up blood cultures. - Continue with IV fluids. - Admit to ICU. - Neurosurgery team to exchange wound VAC at bedside and evaluate  wound clinically, and she will go for further imaging after that and possible I&D if any signs  of infection  Anemia - Most likely in the setting of acute blood loss, postoperative, as well delusional, given he is septic, hypotensive, with possible need of surgery, will transfuse 1 unit PRBC to optimize him before surgery I have discussed with wife and she is agreeable.    Family Communication: Discussed with wife at bedside   Thank you for the consult, we will follow the patient with you in the Hospital.   Seena Dadds M.D on 12/16/2023 at 11:18 AM    Thank you for the consult, we will follow the patient with you in the Hospital.   [facility]   Office  [phone]

## 2023-12-16 NOTE — Progress Notes (Signed)
 Elink following for sepsis protocol. Have communicated w/ bedside RN and spoken directly with lab tech to expedite blood cx's and lactic acid draws.

## 2023-12-16 NOTE — Significant Event (Signed)
 Rapid Response Event Note   Reason for Call :  RED MEWS, temp 103 axillary  Initial Focused Assessment:  Drowsy but answering questions and following commands.  He is weak, keeping eyes closed. He is hot to the touch but dry. Lung sounds decreased bases. Heart tones regular He is shivering and he also has some asterixis  Initially no drainage from wound vac onto bedding.  BP 97/51  HR 102  RR 18  O 2 sat 90% on 2L [place]  Temp 103 axillary  0900 Turned to check rectal temp and give tylenol ,  maroon drainage noted from wound vac on back,  PA notified of change.  Interventions:  Sepsis orders/Code sepsis activation 500 cc NS bolus for BP 85/39 BP improved to 112/47  HR 90  CBG 89 Urine sent PCXR done ABG drawn Labs drawn ABX started  Hospitalist consulted Dr Osborne Blazer at bedside  BP 89/40  HR 89 1L NS bolus   Meghan PA at bedside, cultured drainage from back. CCM consulted: Pete NP at bedside: 12:30  BP 80/39 HR 93,  Levophed gtt started.  Left PIV leaking,  fluids changed to Right AC, abx stopped to start Levophed.  Attempted to started PIV x2 unsuccessful.  Transported to ICU  Plan of Care:    Event Summary:   MD Notified: Henreitta Locus PA Call Time:  [phone] Arrival Time:  0830 End Time: 1300  Waldemar Guillaume, RN

## 2023-12-16 NOTE — Progress Notes (Signed)
 Patient transferred to ICU.  He has been resuscitated with fluid and is currently receiving 1 unit of packed cells.  He is on vasopressors.  His blood pressure is improved.  He is oxygenating well.  He is awake and alert.  He appears mildly toxic but is not in distress.  Continues to have intact motor and sensory function to his lower extremities.  I reviewed the CT scan of his lumbar spine.  This does not demonstrate any major deep wound space collections that I think would benefit from surgical washout.  Continue broad-spectrum antibiotics and efforts at treatment of shock.[name]

## 2023-12-16 NOTE — Plan of Care (Signed)

## 2023-12-16 NOTE — Progress Notes (Signed)
 Neurosurgery office called in regards to patient red MEWS. T 103, BP 97/51, HR 102, RR 18, O2 90% on RA. RR RN Helle called and at bedside. PA Marlana Silvan returned page. New orders for sepsis protocol, Tylenol  PRN, hospitalist consult, and transfer patient to higher level of care. Repeat VS show a rectal temp of 104.2. Rectal tylenol  administered and ice packs placed on patient. Patient responding to voice and light touch. Oriented x3, tremors/shaking noted in upper limbs.  Also notified PA that patient has maroon blood coming from surgical site on back. Bed pad saturated.

## 2023-12-16 NOTE — Progress Notes (Signed)
 Patient with acute worsening overnight with significant hypotensive episode with some moderate hypoxia.  Febrile to 104.  White count elevated to 17,000.  Patient with significant wound erythema now with some serosanguineous foul-smelling drainage.  Back moderately tender.  No new numbness or weakness into his lower extremities.  Patient with likely postoperative wound infection with secondary sepsis.  Plan CT scan to evaluate for deep wound space collection and possible need for irrigation debridement.  Also check CTA to rule out pulmonary embolus.  Transfer to ICU.  Continue broad-spectrum antibiotics and efforts at fluid resuscitation.  Agree with plan blood transfusion.

## 2023-12-16 NOTE — Progress Notes (Signed)
 Patient received from day shift nurse while reporting of feeling awful and shaking of cold,On assessment he was oriented had a bp of 96/49, map of 77 and pulse of 109.warm blanket was given to the patient and doctor informed who ordered for NS 500mls bolus and some lad works.

## 2023-12-16 NOTE — Progress Notes (Signed)
 The day shift nurse asked for assessing the patient for his complain of feeling awful and tremors. On assessing patient was oriented, CBG 124, vital signs within range BP 96/67, MAP 77, Pulse 109, concentrated urine. Patient advised to drink plenty of water. Patient did complain of not having BM for over a week, which he tells is normal for him.

## 2023-12-17 DIAGNOSIS — G9341 Metabolic encephalopathy: Secondary | ICD-10-CM | POA: Diagnosis not present

## 2023-12-17 DIAGNOSIS — E039 Hypothyroidism, unspecified: Secondary | ICD-10-CM | POA: Diagnosis not present

## 2023-12-17 DIAGNOSIS — A419 Sepsis, unspecified organism: Secondary | ICD-10-CM | POA: Diagnosis not present

## 2023-12-17 DIAGNOSIS — R6521 Severe sepsis with septic shock: Secondary | ICD-10-CM | POA: Diagnosis not present

## 2023-12-17 LAB — BPAM RBC
Blood Product Expiration Date: 202505312359
ISSUE DATE / TIME: 202505031343
Unit Type and Rh: 5100

## 2023-12-17 LAB — BLOOD CULTURE ID PANEL (REFLEXED) - BCID2

## 2023-12-17 LAB — CBC
HCT: 27.7 % — ABNORMAL LOW (ref 39.0–52.0)
Hemoglobin: 9.1 g/dL — ABNORMAL LOW (ref 13.0–17.0)
MCH: 30.8 pg (ref 26.0–34.0)
MCHC: 32.9 g/dL (ref 30.0–36.0)
MCV: 93.9 fL (ref 80.0–100.0)
Platelets: 175 10*3/uL (ref 150–400)
RBC: 2.95 MIL/uL — ABNORMAL LOW (ref 4.22–5.81)
RDW: 14.3 % (ref 11.5–15.5)
WBC: 9.7 10*3/uL (ref 4.0–10.5)
nRBC: 0 % (ref 0.0–0.2)

## 2023-12-17 LAB — BASIC METABOLIC PANEL WITH GFR
Anion gap: 12 (ref 5–15)
BUN: 16 mg/dL (ref 8–23)
CO2: 20 mmol/L — ABNORMAL LOW (ref 22–32)
Calcium: 7.8 mg/dL — ABNORMAL LOW (ref 8.9–10.3)
Chloride: 101 mmol/L (ref 98–111)
Creatinine, Ser: 0.69 mg/dL (ref 0.61–1.24)
GFR, Estimated: >60 mL/min (ref >60)
Glucose, Bld: 88 mg/dL (ref 70–99)
Potassium: 3.6 mmol/L (ref 3.5–5.1)
Sodium: 133 mmol/L — ABNORMAL LOW (ref 135–145)

## 2023-12-17 LAB — TYPE AND SCREEN
ABO/RH(D): O POS
Antibody Screen: NEGATIVE
Unit division: 0

## 2023-12-17 LAB — GLUCOSE, CAPILLARY: Glucose-Capillary: 72 mg/dL (ref 70–99)

## 2023-12-17 LAB — MAGNESIUM: Magnesium: 1.3 mg/dL — ABNORMAL LOW (ref 1.7–2.4)

## 2023-12-17 MED ORDER — MAGNESIUM SULFATE 2 GM/50ML IV SOLN
2.0000 g | Freq: Once | INTRAVENOUS | Status: AC
  Administered 2023-12-17: 2 g via INTRAVENOUS
  Filled 2023-12-17: qty 50

## 2023-12-17 MED ORDER — POTASSIUM CHLORIDE CRYS ER 20 MEQ PO TBCR
40.0000 meq | EXTENDED_RELEASE_TABLET | ORAL | Status: AC
  Administered 2023-12-17 (×2): 40 meq via ORAL
  Filled 2023-12-17 (×2): qty 2

## 2023-12-17 MED ORDER — AMIODARONE HCL IN DEXTROSE 360-4.14 MG/200ML-% IV SOLN
30.0000 mg/h | INTRAVENOUS | Status: DC
  Administered 2023-12-17 – 2023-12-18 (×3): 30 mg/h via INTRAVENOUS
  Filled 2023-12-17 (×2): qty 200

## 2023-12-17 MED ORDER — AMIODARONE LOAD VIA INFUSION
150.0000 mg | Freq: Once | INTRAVENOUS | Status: AC
Start: 2023-12-17 — End: 2023-12-17
  Administered 2023-12-17: 150 mg via INTRAVENOUS
  Filled 2023-12-17: qty 83.34

## 2023-12-17 MED ORDER — NOREPINEPHRINE 4 MG/250ML-% IV SOLN
0.0000 ug/min | INTRAVENOUS | Status: DC
  Administered 2023-12-17 – 2023-12-18 (×2): 4 ug/min via INTRAVENOUS
  Administered 2023-12-18: 7 ug/min via INTRAVENOUS
  Administered 2023-12-19: 5 ug/min via INTRAVENOUS
  Administered 2023-12-20 (×2): 2 ug/min via INTRAVENOUS
  Filled 2023-12-17 (×4): qty 250

## 2023-12-17 MED ORDER — AMIODARONE HCL IN DEXTROSE 360-4.14 MG/200ML-% IV SOLN
60.0000 mg/h | INTRAVENOUS | Status: AC
  Administered 2023-12-17 (×2): 60 mg/h via INTRAVENOUS
  Filled 2023-12-17 (×2): qty 200

## 2023-12-17 MED ORDER — LACTATED RINGERS IV BOLUS
1000.0000 mL | Freq: Once | INTRAVENOUS | Status: AC
  Administered 2023-12-17: 1000 mL via INTRAVENOUS

## 2023-12-17 MED ORDER — LACTATED RINGERS IV SOLN: INTRAVENOUS | Status: AC

## 2023-12-17 MED ORDER — SODIUM CHLORIDE 0.9 % IV SOLN
2.0000 g | INTRAVENOUS | Status: DC
  Administered 2023-12-17 – 2023-12-19 (×3): 2 g via INTRAVENOUS
  Filled 2023-12-17 (×3): qty 20

## 2023-12-17 NOTE — Progress Notes (Signed)
 As discussed with PCCM, patient blood pressure remains soft, still requiring pressors support intermittently, so transfer out of ICU currently on hold, patient remains in ICU, PCCM will notify us  when patient is ready to be transferred to progressive. Seena Dadds MD

## 2023-12-17 NOTE — Progress Notes (Signed)
 Patient looks significantly better this morning.  Less toxic.  His fever is trending down with.  He is no longer on pressors.  Back pain well-controlled.  No lower extremity pain.  No abdominal pain.  Current temp 101.6.  Blood pressure 143/57.  O2 sats 100%.  Awake and alert.  Oriented and appropriate.  Motor and sensory function intact.  Abdomen soft.  Chest clear.  Wound with some serosanguineous drainage but no gross purulence.  No change in mild erythema around the wound edge.  No evidence of deeper collection by palpation.  Blood cultures and wound culture both show gram-negative rods.  Patient with superficial wound infection with gram-negative rods sepsis.  Sensitivity and identification pending.  Cefepime should give adequate coverage but will wait further ID and sensitivity testing.  Okay to feed.  Okay to be out of bed as blood pressure tolerates.

## 2023-12-17 NOTE — Progress Notes (Signed)
 Clarified fluid order with Paliwal, MD. Verbal to start fluids per current order.

## 2023-12-17 NOTE — Consult Note (Incomplete)
 [facility]    Date of Admission:  12/05/2023   Total days of inpatient antibiotics 3        Reason for Consult: bactermeia    Principal Problem:   Back pain Active Problems:   S/P lumbar fusion   Septic shock [facility])   Assessment: 73 year old male admitted with: #Hospital onset bacteremia with Proteus mirabilis likely secondary to postop wound - Patient had undergone lumbar decompression and fusion on 3/31 with Dr. Rochelle Chu is doing well until 4 days prior to admission started having increased pain.  MRI in office unremarkable.  Admitted for further workup as pain continued and there was kyphosis on plain film - Patient underwent redo decompression of L2-L4, exploration fusion L3-L4 as well.   [facility] course complicated by increased pain subsequent fever and found Blood cultures on 5/3 grew Proteus mirabilis - CT lumbar spine on 5/3 showed postsurgical changes of the lumbar spine, no definite evidence of abscess "limited due to hardware artifact. . - Neurosurgery following noted superficial wound infection, no evidence of deeper collection with palpation.  Wound culture sent. Recommendations:  -Follow sensitivities of blood cultures - Follow bedside wound culture, reincubating -standard precautions  Evaluation of this patient requires complex antimicrobial therapy evaluation and counseling + isolation needs for disease transmission risk assessment and mitigation   Microbiology:   Antibiotics:  Vancomycin  5/30 Ctx 5/2 Cultures: Blood 5/3/ 1/2 set proteus mirabliis Urine  Other   HPI: Norman Lambert is a 73 y.o. male with past medical history of GERD, gout, hypertension/hyperlipidemia, hypothyroidism, PVD was admitted with severe back pain developing around for couple weeks after lumbar fusion surgery.  Onset of symptoms 4 days prior to admission.  MRI showed no apparent complication but he came into neurosurgery office for evaluation and  plain films showing increasing kyphosis L3/L4.  He underwent internal fixation of previous fusion L2-S1.  Hospital course was complicated by increased pain.  Developed fevers and blood cultures grew Proteus mirabilis.   Review of Systems: Review of Systems  All other systems reviewed and are negative.   Past Medical History:  Diagnosis Date   Abnormal EKG    Arthritis    Essential hypertension 01/02/2017   GERD (gastroesophageal reflux disease)    Gout    Hyperlipidemia 01/02/2017   Hypertension    Hypothyroidism    Peripheral vascular disease (HCC)    RBBB 01/02/2017   Rotator cuff tear    Sleep apnea     Social History   Tobacco Use   Smoking status: Former    Current packs/day: 0.00    Average packs/day: 3.0 packs/day for 20.0 years (60.0 ttl pk-yrs)    Types: Cigarettes    Start date: 12/09/1966    Quit date: 12/09/1986    Years since quitting: 37.0   Smokeless tobacco: Never  Vaping Use   Vaping status: Some Days  Substance Use Topics   Alcohol use: Yes    Comment: social   Drug use: No    History reviewed. No pertinent family history. Scheduled Meds:  vitamin C   1,000 mg Oral Daily   bisacodyl   10 mg Rectal Daily   Chlorhexidine  Gluconate Cloth  6 each Topical Daily   cyanocobalamin   1,000 mcg Oral Daily   enoxaparin  (LOVENOX ) injection  50 mg Subcutaneous Q24H   feeding supplement  237 mL Oral BID BM   finasteride   5 mg Oral Daily   folic acid   1  mg Oral Daily   gabapentin   300 mg Oral TID   levothyroxine   112 mcg Oral Q0600   loratadine   10 mg Oral Daily   modafinil   200 mg Oral Daily   oxyCODONE   10 mg Oral Q12H   pantoprazole   40 mg Oral Daily   senna  1 tablet Oral BID   sodium chloride  flush  3 mL Intravenous Q12H   tamsulosin   0.8 mg Oral Daily   zinc  sulfate (50mg  elemental zinc )  220 mg Oral Daily   Continuous Infusions:  amiodarone 30 mg/hr (12/17/23 2000)   cefTRIAXone (ROCEPHIN)  IV Stopped (12/17/23 1027)   lactated ringers  75 mL/hr at  12/17/23 2000   norepinephrine (LEVOPHED) Adult infusion 2 mcg/min (12/17/23 2000)   sodium chloride      PRN Meds:.acetaminophen  **OR** acetaminophen , bisacodyl , bisacodyl , HYDROmorphone  (DILAUDID ) injection, menthol -cetylpyridinium **OR** phenol, methocarbamol  **OR** methocarbamol  (ROBAXIN ) injection, ondansetron  **OR** ondansetron  (ZOFRAN ) IV, oxyCODONE , sodium chloride , sodium chloride  flush Allergies  Allergen Reactions   Benzoic Acid Hives and Rash    Positive Patch Test 11/12/20   Imidurea Rash    Positive Patch Test 11/12/20   Urea  Rash    Positive Patch Test 11/12/20   Other Rash and Other (See Comments)    " DARK DYES " [ UNSPECIFIED DYE ]    OBJECTIVE: Blood pressure (!) 116/45, pulse 96, temperature 99.2 F (37.3 C), temperature source Oral, resp. rate 18, height 6' (1.829 m), weight 92.2 kg, SpO2 99%.  Physical Exam Constitutional:      General: He is not in acute distress.    Appearance: He is normal weight. He is not toxic-appearing.  HENT:     Head: Normocephalic and atraumatic.     Right Ear: External ear normal.     Left Ear: External ear normal.     Nose: No congestion or rhinorrhea.     Mouth/Throat:     Mouth: Mucous membranes are moist.     Pharynx: Oropharynx is clear.  Eyes:     Extraocular Movements: Extraocular movements intact.     Conjunctiva/sclera: Conjunctivae normal.     Pupils: Pupils are equal, round, and reactive to light.  Cardiovascular:     Rate and Rhythm: Normal rate and regular rhythm.     Heart sounds: No murmur heard.    No friction rub. No gallop.  Pulmonary:     Effort: Pulmonary effort is normal.     Breath sounds: Normal breath sounds.  Abdominal:     General: Abdomen is flat. Bowel sounds are normal.     Palpations: Abdomen is soft.  Musculoskeletal:        General: No swelling.     Cervical back: Normal range of motion and neck supple.  Skin:    General: Skin is warm and dry.  Neurological:     General: No focal deficit  present.     Mental Status: He is oriented to person, place, and time.  Psychiatric:        Mood and Affect: Mood normal.     Lab Results Lab Results  Component Value Date   WBC 9.7 12/17/2023   HGB 9.1 (L) 12/17/2023   HCT 27.7 (L) 12/17/2023   MCV 93.9 12/17/2023   PLT 175 12/17/2023    Lab Results  Component Value Date   CREATININE 0.69 12/17/2023   BUN 16 12/17/2023   NA 133 (L) 12/17/2023   K 3.6 12/17/2023   CL 101 12/17/2023   CO2 20 (L) 12/17/2023  Lab Results  Component Value Date   ALT 41 12/16/2023   AST 49 (H) 12/16/2023   ALKPHOS 89 12/16/2023   BILITOT 0.6 12/16/2023       Orlie Bjornstad, MD [facility] 12/17/2023, 10:10 PM

## 2023-12-17 NOTE — Progress Notes (Signed)
 PHARMACY - PHYSICIAN COMMUNICATION CRITICAL VALUE ALERT - BLOOD CULTURE IDENTIFICATION (BCID)  Norman Lambert is an 73 y.o. male who presented to [facility] on 12/05/2023 with a chief complaint of severe back pain a few weeks after lumbar fusion surgery.  Assessment:  Admitted for increasing kyphosis and plan for surgery; about a week later during recovery pt developed signs of sepsis d/t wound infection; blood cx growing Proteus is 1 of 4 bottles.  Name of physician (or Provider) ContactedDonn Fury MD  Current antibiotics: cefepime and vancomycin   Changes to prescribed antibiotics recommended:  Narrow from cefepime to ceftriaxone; will leave MRSA coverage for now and await clearance from neurosurgery.  Results for orders placed or performed during the hospital encounter of 12/05/23  Blood Culture ID Panel (Reflexed) (Collected: 12/16/2023 10:49 AM)  Result Value Ref Range   Enterococcus faecalis NOT DETECTED NOT DETECTED   Enterococcus Faecium NOT DETECTED NOT DETECTED   Listeria monocytogenes NOT DETECTED NOT DETECTED   Staphylococcus species NOT DETECTED NOT DETECTED   Staphylococcus aureus (BCID) NOT DETECTED NOT DETECTED   Staphylococcus epidermidis NOT DETECTED NOT DETECTED   Staphylococcus lugdunensis NOT DETECTED NOT DETECTED   Streptococcus species NOT DETECTED NOT DETECTED   Streptococcus agalactiae NOT DETECTED NOT DETECTED   Streptococcus pneumoniae NOT DETECTED NOT DETECTED   Streptococcus pyogenes NOT DETECTED NOT DETECTED   A.calcoaceticus-baumannii NOT DETECTED NOT DETECTED   Bacteroides fragilis NOT DETECTED NOT DETECTED   Enterobacterales DETECTED (A) NOT DETECTED   Enterobacter cloacae complex NOT DETECTED NOT DETECTED   Escherichia coli NOT DETECTED NOT DETECTED   Klebsiella aerogenes NOT DETECTED NOT DETECTED   Klebsiella oxytoca NOT DETECTED NOT DETECTED   Klebsiella pneumoniae NOT DETECTED NOT DETECTED   Proteus species DETECTED (A) NOT DETECTED   Salmonella  species NOT DETECTED NOT DETECTED   Serratia marcescens NOT DETECTED NOT DETECTED   Haemophilus influenzae NOT DETECTED NOT DETECTED   Neisseria meningitidis NOT DETECTED NOT DETECTED   Pseudomonas aeruginosa NOT DETECTED NOT DETECTED   Stenotrophomonas maltophilia NOT DETECTED NOT DETECTED   Candida albicans NOT DETECTED NOT DETECTED   Candida auris NOT DETECTED NOT DETECTED   Candida glabrata NOT DETECTED NOT DETECTED   Candida krusei NOT DETECTED NOT DETECTED   Candida parapsilosis NOT DETECTED NOT DETECTED   Candida tropicalis NOT DETECTED NOT DETECTED   Cryptococcus neoformans/gattii NOT DETECTED NOT DETECTED   CTX-M ESBL NOT DETECTED NOT DETECTED   Carbapenem resistance IMP NOT DETECTED NOT DETECTED   Carbapenem resistance KPC NOT DETECTED NOT DETECTED   Carbapenem resistance NDM NOT DETECTED NOT DETECTED   Carbapenem resist OXA 48 LIKE NOT DETECTED NOT DETECTED   Carbapenem resistance VIM NOT DETECTED NOT DETECTED    Lonnie Roberts, PharmD, BCPS  12/17/2023  1:44 AM

## 2023-12-17 NOTE — Progress Notes (Addendum)
 NAME:  Norman Lambert, MRN:  [id number], DOB:  Sep 02, 1950, LOS: 12 ADMISSION DATE:  12/05/2023, CONSULTATION DATE:  5/3 REFERRING MD:  elergawy, CHIEF COMPLAINT:  septic shock    History of Present Illness:  73 year old male patient who was admitted on 4/25 for reduction and internal fixation of previous fusions involving L2-S1. Postoperative course initially seemed unremarkable pain had improved still not able to walk as of 4/29 on 4/30 began to have new increased pain involving the left foot, on 5/1 early in the morning continued to report 10 out of 10 pain seemingly not responding to as needed narcotics.  Imaging of the back was unremarkable, he continued to have drain VAC in place in the evening hours of 5/2 began to have borderline hypotension with tachycardia 70 have shakes and tremors in the evening, temperature spike to 103 was started on IV cefepime and vancomycin  with concern about sepsis white blood cell count elevated to 17,000 there was significant wound erythema with serosanguineous foul-smelling drainage from the surgical site.  Initially internal medicine was consulted but after administering about a liter of crystalloid the patient remained hypotensive in the 70s so critical care was asked to evaluate for possible transfer to the intensive care  Pertinent  Medical History  Hypertension, GERD, gout, hyperlipidemia,-hypothyroidism peripheral vascular disease, sleep apnea, right bundle branch block prior lumbar back surgery  Significant Hospital Events: Including procedures, antibiotic start and stop dates in addition to other pertinent events   4/25 extensive L2-S1 lumbar surgery 4/30 new onset pain  5/1 ongoing pain borderline blood pressure 5/2 pain leukocytosis fever, 5/3 hypotension delirium, not responding to crystalloid started on broad-spectrum antibiotics wound assessed by surgical team culture sent imaging pending ICU transfer. CT imaging of lumbar spine was negative for  definitive abscess there were stable fractures at L3 and L4 with possible new fracture involving the transverse process of L2 and L3 on the left postoperative changes noted.CT angio of chest was negative for pulmonary emboli had bibasilar airspace disease the right was a little worse than the left consistent with possible pneumonia there was aortic aneurysmal dilation of the ascending aorta measuring at 4.5 cm 5/4: Weaned off norepinephrine, Blood cultures from 1 out of 2 samples growing gram-negative rods with BC ID showing both protease and Enterobacterales, MRSA PCR still pending  Interim History / Subjective:  Awake no acute distress does report left hip and back pain Objective   Blood pressure (!) 143/57, pulse 83, temperature 98.2 F (36.8 C), temperature source Oral, resp. rate 16, height 6' (1.829 m), weight 92.2 kg, SpO2 100%.        Intake/Output Summary (Last 24 hours) at 12/17/2023 0717 Last data filed at 12/17/2023 0700 Gross per 24 hour  Intake 3033.91 ml  Output 2900 ml  Net 133.91 ml   Filed Weights   12/06/23 1300 12/08/23 0957 12/17/23 0545  Weight: 103 kg 101.6 kg 92.2 kg    Examination: General 73 year old male patient's currently lying in bed no acute distress Neuro awake, oriented x 3.  Still confused from time to time but easily reoriented, he is weak, has chronic left foot drop.  Overall compared to exam on 5/3 however much improved HEENT normocephalic atraumatic no jugular venous distention is appreciated Pulmonary some basilar crackles, rhonchi improved, overall physical exam improved compared to exam on yesterday CT evaluation from yesterday's film showed bibasilar airspace disease right greater than left Cardiac regular rate and rhythm without murmur rub or gallop Abdomen soft nontender no  organomegaly Extremities are warm and well-perfused GU clear yellow  Resolved Hospital Problem list     Assessment & Plan:  Septic shock with gram-negative rod  bacteremia and lactic acidosis secondary to surgical wound site infection, +/-pneumonia - Responded nicely to fluid resuscitation and antibiotics.  Lactate cleared, white blood cell count improved.  Imaging negative for abscess, no surgical intervention required Plan Continue day #2 abx cefepime changed to ceftriaxone and still on vancomycin , awaiting MRSA PCR, I suspect will be able to discontinue the bank Decrease maintenance IV fluids encourage p.o. intake Wound care and further imaging as directed by surgical team Holding antihypertensives and diuretic 1 more day Stable to go back to progressive  PAf w/ RVR (new); now back in NSR. No chest pain and asymptomatic  Plan ECHO  Ensure K > 4 and Mg > 2 Cont tele  Hold off from systemic ac for now s/p lumb spine surg  Acute respiratory failure, secondary to pneumonia and atelectasis New supplemental oxygen need Improved with antibiotics Plan Wean oxygen Incentive spirometry Mobilize Antibiotics as above  Status post redo of laminectomy L3-L4 with posterior fixation of L2-S1 Plan Continuing postoperative pain management, careful with narcotics given hypotension Wound/drain management per neurosurgical team We should be able to start PT back  Acute metabolic encephalopathy secondary sepsis: This is improved some Plan Supportive care Treat infection  Postoperative anemia.  Has slowly drifted from baseline of 12.5-7.5 got 1 unit of PRBCs on 5/3 Plan Trend CBC Watch for evidence of bleeding Criteria for transfusion hemoglobin less than 7 or evidence of active bleeding with hemodynamic instability  History of hypertension, hyperlipidemia, and peripheral vascular disease Plan Holding his Norvasc , Diovan and hydrochlorothiazide .  Reassess on 5/5 to determine whether or not to  Crestor   Hypothyroidism Plan Synthroid   BPH Plan Can continue to take his finasteride  and Flomax  as able  History of GERD Plan PPI Best Practice  (right click and "Reselect all SmartList Selections" daily)   Diet/type: Regular consistency (see orders) DVT prophylaxis LMWH Pressure ulcer(s): N/A GI prophylaxis: PPI Lines: N/A Foley:  N/A Code Status:  full code Last date of multidisciplinary goals of care discussion [pending]  Critical care time: NA ready for progressive. Will transfer and ask IM to re-assume supportive consult role.

## 2023-12-17 NOTE — Consult Note (Incomplete)
 [facility]    Date of Admission:  12/05/2023   Total days of inpatient antibiotics 2        Reason for Consult: bactermeia    Principal Problem:   Back pain Active Problems:   S/P lumbar fusion   Septic shock (HCC)   Assessment: ***   Recommendations:   Microbiology:   Antibiotics:  Vancomycin  5/30 Ctx 5/2 Cultures: Blood 5/3/ 1/2 set proteus mirabliis Urine  Other   HPI: Norman Lambert is a 73 y.o. male with past medical history of GERD, gout, hypertension/hyperlipidemia, hypothyroidism, PVD was admitted with severe back pain developing around for couple weeks after lumbar fusion surgery.  Onset of symptoms 4 days prior to admission.  MRI showed no apparent complication but he came into neurosurgery office for evaluation and plain films showing increasing kyphosis L3/L4.  He underwent   Review of Systems: Review of Systems  All other systems reviewed and are negative.   Past Medical History:  Diagnosis Date  . Abnormal EKG   . Arthritis   . Essential hypertension 01/02/2017  . GERD (gastroesophageal reflux disease)   . Gout   . Hyperlipidemia 01/02/2017  . Hypertension   . Hypothyroidism   . Peripheral vascular disease (HCC)   . RBBB 01/02/2017  . Rotator cuff tear   . Sleep apnea     Social History   Tobacco Use  . Smoking status: Former    Current packs/day: 0.00    Average packs/day: 3.0 packs/day for 20.0 years (60.0 ttl pk-yrs)    Types: Cigarettes    Start date: 12/09/1966    Quit date: 12/09/1986    Years since quitting: 37.0  . Smokeless tobacco: Never  Vaping Use  . Vaping status: Some Days  Substance Use Topics  . Alcohol use: Yes    Comment: social  . Drug use: No    History reviewed. No pertinent family history. Scheduled Meds: . vitamin C   1,000 mg Oral Daily  . bisacodyl   10 mg Rectal Daily  . Chlorhexidine  Gluconate Cloth  6 each Topical Daily  . cyanocobalamin   1,000 mcg Oral Daily  .  enoxaparin  (LOVENOX ) injection  50 mg Subcutaneous Q24H  . feeding supplement  237 mL Oral BID BM  . finasteride   5 mg Oral Daily  . folic acid   1 mg Oral Daily  . gabapentin   300 mg Oral TID  . levothyroxine   112 mcg Oral Q0600  . loratadine   10 mg Oral Daily  . modafinil   200 mg Oral Daily  . oxyCODONE   10 mg Oral Q12H  . pantoprazole   40 mg Oral Daily  . senna  1 tablet Oral BID  . sodium chloride  flush  3 mL Intravenous Q12H  . tamsulosin   0.8 mg Oral Daily  . zinc  sulfate (50mg  elemental zinc )  220 mg Oral Daily   Continuous Infusions: . amiodarone 30 mg/hr (12/17/23 2000)  . cefTRIAXone (ROCEPHIN)  IV Stopped (12/17/23 1027)  . lactated ringers  75 mL/hr at 12/17/23 2000  . norepinephrine (LEVOPHED) Adult infusion 2 mcg/min (12/17/23 2000)  . sodium chloride      PRN Meds:.acetaminophen  **OR** acetaminophen , bisacodyl , bisacodyl , HYDROmorphone  (DILAUDID ) injection, menthol -cetylpyridinium **OR** phenol, methocarbamol  **OR** methocarbamol  (ROBAXIN ) injection, ondansetron  **OR** ondansetron  (ZOFRAN ) IV, oxyCODONE , sodium chloride , sodium chloride  flush Allergies  Allergen Reactions  . Benzoic Acid Hives and Rash    Positive Patch Test 11/12/20  . Imidurea Rash    Positive Patch  Test 11/12/20  . Urea  Rash    Positive Patch Test 11/12/20  . Other Rash and Other (See Comments)    " DARK DYES " [ UNSPECIFIED DYE ]    OBJECTIVE: Blood pressure (!) 116/45, pulse 96, temperature 99.2 F (37.3 C), temperature source Oral, resp. rate 18, height 6' (1.829 m), weight 92.2 kg, SpO2 99%.  Physical Exam Constitutional:      General: He is not in acute distress.    Appearance: He is normal weight. He is not toxic-appearing.  HENT:     Head: Normocephalic and atraumatic.     Right Ear: External ear normal.     Left Ear: External ear normal.     Nose: No congestion or rhinorrhea.     Mouth/Throat:     Mouth: Mucous membranes are moist.     Pharynx: Oropharynx is clear.  Eyes:      Extraocular Movements: Extraocular movements intact.     Conjunctiva/sclera: Conjunctivae normal.     Pupils: Pupils are equal, round, and reactive to light.  Cardiovascular:     Rate and Rhythm: Normal rate and regular rhythm.     Heart sounds: No murmur heard.    No friction rub. No gallop.  Pulmonary:     Effort: Pulmonary effort is normal.     Breath sounds: Normal breath sounds.  Abdominal:     General: Abdomen is flat. Bowel sounds are normal.     Palpations: Abdomen is soft.  Musculoskeletal:        General: No swelling. Normal range of motion.     Cervical back: Normal range of motion and neck supple.  Skin:    General: Skin is warm and dry.  Neurological:     General: No focal deficit present.     Mental Status: He is oriented to person, place, and time.  Psychiatric:        Mood and Affect: Mood normal.     Lab Results Lab Results  Component Value Date   WBC 9.7 12/17/2023   HGB 9.1 (L) 12/17/2023   HCT 27.7 (L) 12/17/2023   MCV 93.9 12/17/2023   PLT 175 12/17/2023    Lab Results  Component Value Date   CREATININE 0.69 12/17/2023   BUN 16 12/17/2023   NA 133 (L) 12/17/2023   K 3.6 12/17/2023   CL 101 12/17/2023   CO2 20 (L) 12/17/2023    Lab Results  Component Value Date   ALT 41 12/16/2023   AST 49 (H) 12/16/2023   ALKPHOS 89 12/16/2023   BILITOT 0.6 12/16/2023       Orlie Bjornstad, MD [facility] 12/17/2023, 10:10 PM

## 2023-12-18 ENCOUNTER — Inpatient Hospital Stay (HOSPITAL_COMMUNITY)

## 2023-12-18 DIAGNOSIS — R6521 Severe sepsis with septic shock: Secondary | ICD-10-CM | POA: Diagnosis not present

## 2023-12-18 DIAGNOSIS — R9431 Abnormal electrocardiogram [ECG] [EKG]: Secondary | ICD-10-CM

## 2023-12-18 DIAGNOSIS — Z981 Arthrodesis status: Secondary | ICD-10-CM

## 2023-12-18 DIAGNOSIS — J9601 Acute respiratory failure with hypoxia: Secondary | ICD-10-CM

## 2023-12-18 DIAGNOSIS — B964 Proteus (mirabilis) (morganii) as the cause of diseases classified elsewhere: Secondary | ICD-10-CM | POA: Diagnosis not present

## 2023-12-18 DIAGNOSIS — A419 Sepsis, unspecified organism: Secondary | ICD-10-CM | POA: Diagnosis not present

## 2023-12-18 DIAGNOSIS — I480 Paroxysmal atrial fibrillation: Secondary | ICD-10-CM

## 2023-12-18 LAB — ECHOCARDIOGRAM COMPLETE
AR max vel: 1.51 cm2
AV Area VTI: 1.51 cm2
AV Area mean vel: 1.45 cm2
AV Mean grad: 14 mmHg
AV Peak grad: 25.4 mmHg
Ao pk vel: 2.52 m/s
Area-P 1/2: 3.65 cm2
Height: 72 in
P 1/2 time: 252 ms
S' Lateral: 3.9 cm
Weight: 3425.07 [oz_av]

## 2023-12-18 LAB — BASIC METABOLIC PANEL WITH GFR
Anion gap: 11 (ref 5–15)
BUN: 11 mg/dL (ref 8–23)
CO2: 19 mmol/L — ABNORMAL LOW (ref 22–32)
Calcium: 7.7 mg/dL — ABNORMAL LOW (ref 8.9–10.3)
Chloride: 100 mmol/L (ref 98–111)
Creatinine, Ser: 0.55 mg/dL — ABNORMAL LOW (ref 0.61–1.24)
GFR, Estimated: >60 mL/min (ref >60)
Glucose, Bld: 102 mg/dL — ABNORMAL HIGH (ref 70–99)
Potassium: 3.8 mmol/L (ref 3.5–5.1)
Sodium: 130 mmol/L — ABNORMAL LOW (ref 135–145)

## 2023-12-18 LAB — GLUCOSE, CAPILLARY
Glucose-Capillary: 81 mg/dL (ref 70–99)
Glucose-Capillary: 153 mg/dL — ABNORMAL HIGH (ref 70–99)
Glucose-Capillary: 166 mg/dL — ABNORMAL HIGH (ref 70–99)
Glucose-Capillary: 159 mg/dL — ABNORMAL HIGH (ref 70–99)
Glucose-Capillary: 157 mg/dL — ABNORMAL HIGH (ref 70–99)

## 2023-12-18 LAB — MAGNESIUM: Magnesium: 1.7 mg/dL (ref 1.7–2.4)

## 2023-12-18 LAB — CULTURE, BLOOD (ROUTINE X 2): Special Requests: ADEQUATE

## 2023-12-18 LAB — CORTISOL: Cortisol, Plasma: 14.9 ug/dL

## 2023-12-18 LAB — PHOSPHORUS: Phosphorus: 2.2 mg/dL — ABNORMAL LOW (ref 2.5–4.6)

## 2023-12-18 MED ORDER — POTASSIUM CHLORIDE 20 MEQ PO PACK
40.0000 meq | PACK | Freq: Once | ORAL | Status: AC
  Administered 2023-12-18: 40 meq via ORAL
  Filled 2023-12-18: qty 2

## 2023-12-18 MED ORDER — HYDROMORPHONE HCL 1 MG/ML IJ SOLN
0.5000 mg | Freq: Once | INTRAMUSCULAR | Status: AC
  Administered 2023-12-18: 0.5 mg via INTRAVENOUS

## 2023-12-18 MED ORDER — IPRATROPIUM-ALBUTEROL 0.5-2.5 (3) MG/3ML IN SOLN
3.0000 mL | RESPIRATORY_TRACT | Status: DC | PRN
  Filled 2023-12-18: qty 3

## 2023-12-18 MED ORDER — DEXTROSE 5 % IV SOLN
INTRAVENOUS | Status: DC
  Filled 2023-12-18: qty 1000

## 2023-12-18 MED ORDER — ROSUVASTATIN CALCIUM 5 MG PO TABS
5.0000 mg | ORAL_TABLET | Freq: Every day | ORAL | Status: DC
Start: 2023-12-18 — End: 2024-01-15
  Administered 2023-12-18 – 2024-01-12 (×26): 5 mg via ORAL
  Filled 2023-12-18 (×26): qty 1

## 2023-12-18 MED ORDER — MAGNESIUM SULFATE 2 GM/50ML IV SOLN
2.0000 g | Freq: Once | INTRAVENOUS | Status: AC
  Administered 2023-12-18: 2 g via INTRAVENOUS
  Filled 2023-12-18: qty 50

## 2023-12-18 MED ORDER — HYDROCORTISONE SOD SUC (PF) 100 MG IJ SOLR
100.0000 mg | Freq: Two times a day (BID) | INTRAMUSCULAR | Status: DC
  Administered 2023-12-18 – 2023-12-19 (×3): 100 mg via INTRAVENOUS
  Filled 2023-12-18 (×3): qty 2

## 2023-12-18 MED ORDER — INSULIN ASPART 100 UNIT/ML IJ SOLN
2.0000 [IU] | INTRAMUSCULAR | Status: DC
  Administered 2023-12-18 – 2023-12-19 (×3): 4 [IU] via SUBCUTANEOUS
  Administered 2023-12-19: 2 [IU] via SUBCUTANEOUS
  Administered 2023-12-19: 4 [IU] via SUBCUTANEOUS
  Administered 2023-12-19 (×2): 2 [IU] via SUBCUTANEOUS

## 2023-12-18 MED ORDER — SODIUM PHOSPHATES 45 MMOLE/15ML IV SOLN
15.0000 mmol | Freq: Once | INTRAVENOUS | Status: AC
  Administered 2023-12-18: 15 mmol via INTRAVENOUS
  Filled 2023-12-18: qty 5

## 2023-12-18 MED ORDER — HYALURONIDASE HUMAN 150 UNIT/ML IJ SOLN
150.0000 [IU] | Freq: Once | INTRAMUSCULAR | Status: AC
  Administered 2023-12-18: 150 [IU] via SUBCUTANEOUS
  Filled 2023-12-18: qty 1

## 2023-12-18 MED ORDER — TAMSULOSIN HCL 0.4 MG PO CAPS
0.4000 mg | ORAL_CAPSULE | Freq: Every day | ORAL | Status: DC
  Administered 2023-12-19 – 2023-12-20 (×2): 0.4 mg via ORAL
  Filled 2023-12-18 (×2): qty 1

## 2023-12-18 MED ORDER — ACETAMINOPHEN 10 MG/ML IV SOLN
1000.0000 mg | Freq: Four times a day (QID) | INTRAVENOUS | Status: AC
  Administered 2023-12-18 – 2023-12-19 (×4): 1000 mg via INTRAVENOUS
  Filled 2023-12-18 (×3): qty 100

## 2023-12-18 NOTE — Plan of Care (Signed)

## 2023-12-18 NOTE — Progress Notes (Signed)
 Echocardiogram 2D Echocardiogram has been performed.  Norman Lambert 12/18/2023, 10:03 AM

## 2023-12-18 NOTE — Progress Notes (Signed)
 Physical Therapy Treatment Patient Details Name: Norman Lambert MRN: [id number] DOB: 1951/02/22 Today's Date: 12/18/2023   History of Present Illness 73 yo male admitted 12/05/23 with LB and severe back pain. 12/08/23 redo of laminectomy L3-4 with posterior fixation L2-S1; recovery complicated by pain and hypotension; 5/2 hypotension and tachycardia. 5/3 transfers to ICU, pressors, L2-3 TVP fx. 5/5 VAC placed. PMHx: gout, arthritis, HTN, HLD, HTN, PVD, RBBB, Rt reverse TSA, sleep apnea, 11/13/23 PLIF L3-4    PT Comments  Pt with flat affect, confused regarding time and place with increased time to process cues. Pt able to transition to sitting and stand at EOB but could not side step or unweight feet in standing for further transfers this date. Pt with max assist to don/doff brace and wife educated for wound VAc parts and charging. Pt with noted episode of bradycardia with return to 74 at rest supine and sPO2 94% on 3L. Will continue to follow with pt limited by medical complication, fatigue and weakness. Patient will benefit from continued inpatient follow up therapy, <3 hours/day     If plan is discharge home, recommend the following: A lot of help with bathing/dressing/bathroom;Two people to help with walking and/or transfers;Assistance with cooking/housework;Assist for transportation   Can travel by private vehicle     No  Equipment Recommendations  Rolling walker (2 wheels);BSC/3in1    Recommendations for Other Services       Precautions / Restrictions Precautions Precautions: Back;Fall;Other (comment) Precaution Booklet Issued: No Recall of Precautions/Restrictions: Intact Precaution/Restrictions Comments: Able to verbalize 3/3 precautions, assist for brace donning, decreased awareness of fall risk Required Braces or Orthoses: Spinal Brace Spinal Brace: Lumbar corset;Applied in sitting position Other Brace: AFO for LLE (not donned this date)     Mobility  Bed Mobility Overal bed  mobility: Needs Assistance Bed Mobility: Rolling, Sidelying to Sit, Sit to Sidelying Rolling: Mod assist Sidelying to sit: Mod assist, HOB elevated, Used rails   Sit to supine: Mod assist   General bed mobility comments: rolling left with use of rail, pad and mod assist. Rise to sitting with assist to clear legs and elevate trunk from surface. Return to supine with assist to lift legs to surface. Total +2 to slide to [facility]    Transfers Overall transfer level: Needs assistance   Transfers: Sit to/from Stand Sit to Stand: Mod assist, From elevated surface           General transfer comment: left knee blocked, cues for hand placement, elevated surface and mod assist to rise x 2 trials from bed. Pt maintaining partial flexion and only standing grossly 5sec each trial. Mod assist to scoot at EOB toward [facility]    Ambulation/Gait               General Gait Details: unable to attempt this date   Stairs             Wheelchair Mobility     Tilt Bed    Modified Rankin (Stroke Patients Only)       Balance Overall balance assessment: Needs assistance Sitting-balance support: Feet supported, Bilateral upper extremity supported Sitting balance-Leahy Scale: Fair Sitting balance - Comments: EOB with CGA   Standing balance support: Bilateral upper extremity supported, During functional activity, Reliant on assistive device for balance Standing balance-Leahy Scale: Poor Standing balance comment: reliant on RW  Communication Communication Communication: No apparent difficulties  Cognition Arousal: Alert Behavior During Therapy: Flat affect   PT - Cognitive impairments: Orientation, Memory, Problem solving, Safety/Judgement   Orientation impairments: Time, Situation, Place                   PT - Cognition Comments: pt oriented to self and wife, aware he had sx but stating [facility] Following commands: Impaired Following  commands impaired: Follows one step commands with increased time    Cueing Cueing Techniques: Verbal cues, Gestural cues, Tactile cues  Exercises      General Comments        Pertinent Vitals/Pain Pain Assessment Pain Assessment: 0-10 Pain Score: 7  Pain Location: back Pain Descriptors / Indicators: Aching, Guarding Pain Intervention(s): Limited activity within patient's tolerance, Monitored during session, Premedicated before session, Repositioned    Home Living                          Prior Function            PT Goals (current goals can now be found in the care plan section) Progress towards PT goals: Progressing toward goals    Frequency    Min 2X/week      PT Plan      Co-evaluation              AM-PAC PT "6 Clicks" Mobility   Outcome Measure  Help needed turning from your back to your side while in a flat bed without using bedrails?: A Lot Help needed moving from lying on your back to sitting on the side of a flat bed without using bedrails?: A Lot Help needed moving to and from a bed to a chair (including a wheelchair)?: Total Help needed standing up from a chair using your arms (e.g., wheelchair or bedside chair)?: A Lot Help needed to walk in hospital room?: Total Help needed climbing 3-5 steps with a railing? : Total 6 Click Score: 9    End of Session Equipment Utilized During Treatment: Back brace;Gait belt Activity Tolerance: Patient tolerated treatment well Patient left: in bed;with call bell/phone within reach;with nursing/sitter in room Nurse Communication: Mobility status PT Visit Diagnosis: Unsteadiness on feet (R26.81);History of falling (Z91.81);Muscle weakness (generalized) (M62.81);Pain     Time: 1100-1129 PT Time Calculation (min) (ACUTE ONLY): 29 min  Charges:    $Therapeutic Activity: 23-37 mins PT General Charges $$ ACUTE PT VISIT: 1 Visit                     Annis Baseman, PT Acute Rehabilitation  Services Office: [phone]    Jackey Mary Bebe Moncure 12/18/2023, 11:42 AM

## 2023-12-18 NOTE — Progress Notes (Signed)
 NAME:  Norman Lambert, MRN:  [id number], DOB:  05/16/1951, LOS: 13 ADMISSION DATE:  12/05/2023, CONSULTATION DATE:  5/3 REFERRING MD:  [email or web address], CHIEF COMPLAINT:  septic shock    History of Present Illness:  73 year old male patient who was admitted on 4/25 for reduction and internal fixation of previous fusions involving L2-S1. Postoperative course initially seemed unremarkable pain had improved still not able to walk as of 4/29 on 4/30 began to have new increased pain involving the left foot, on 5/1 early in the morning continued to report 10 out of 10 pain seemingly not responding to as needed narcotics.  Imaging of the back was unremarkable, he continued to have drain VAC in place in the evening hours of 5/2 began to have borderline hypotension with tachycardia 70 have shakes and tremors in the evening, temperature spike to 103 was started on IV cefepime and vancomycin  with concern about sepsis white blood cell count elevated to 17,000 there was significant wound erythema with serosanguineous foul-smelling drainage from the surgical site.  Initially internal medicine was consulted but after administering about a liter of crystalloid the patient remained hypotensive in the 70s so critical care was asked to evaluate for possible transfer to the intensive care  Pertinent  Medical History  Hypertension, GERD, gout, hyperlipidemia,-hypothyroidism peripheral vascular disease, sleep apnea, right bundle branch block prior lumbar back surgery  Significant Hospital Events: Including procedures, antibiotic start and stop dates in addition to other pertinent events   4/25 extensive L2-S1 lumbar surgery 4/30 new onset pain  5/1 ongoing pain borderline blood pressure 5/2 pain leukocytosis fever, 5/3 hypotension delirium, not responding to crystalloid started on broad-spectrum antibiotics wound assessed by surgical team culture sent imaging pending ICU transfer. CT imaging of lumbar spine was negative for  definitive abscess there were stable fractures at L3 and L4 with possible new fracture involving the transverse process of L2 and L3 on the left postoperative changes noted.CT angio of chest was negative for pulmonary emboli had bibasilar airspace disease the right was a little worse than the left consistent with possible pneumonia there was aortic aneurysmal dilation of the ascending aorta measuring at 4.5 cm 5/4: Weaned off norepinephrine, Blood cultures from 1 out of 2 samples growing gram-negative rods with BC ID showing both protease and Enterobacterales, MRSA PCR still pending 5/5 remains on low dose NE. Febrile. BC growing proteus.   Interim History / Subjective:  c/o back pain, chills, Rigors.   Objective   Blood pressure (!) 110/47, pulse 100, temperature (!) 101 F (38.3 C), temperature source Axillary, resp. rate 17, height 6' (1.829 m), weight 97.1 kg, SpO2 97%.        Intake/Output Summary (Last 24 hours) at 12/18/2023 0801 Last data filed at 12/18/2023 0600 Gross per 24 hour  Intake 3410.68 ml  Output 1600 ml  Net 1810.68 ml   Filed Weights   12/08/23 0957 12/17/23 0545 12/18/23 0615  Weight: 101.6 kg 92.2 kg 97.1 kg    Examination: General elderly appearing gentleman in NAD Neuro awake, alert, oriented.  HEENT normocephalic atraumatic no jugular venous distention is appreciated Pulmonary some basilar crackles, rhonchi improved, overall physical exam improved compared to exam on yesterday CT evaluation from yesterday's film showed bibasilar airspace disease right greater than left Cardiac regular rate and rhythm without murmur rub or gallop Abdomen soft nontender no organomegaly Extremities are warm and well-perfused GU clear yellow  Resolved Hospital Problem list     Assessment & Plan:  Septic shock with  gram-negative rod bacteremia, Proteus: primary site unclear. Wound bed does not appear infected per neurosurgery. Urine looked OK. Possible PNA.  - Responded nicely  to fluid resuscitation and antibiotics.  Lactate cleared, white blood cell count improved.  Imaging negative for abscess, no surgical intervention required Plan: Continue day #3 abx cefepime changed to ceftriaxone and still on vancomycin , awaiting MRSA PCR, I suspect will be able to discontinue. ID consult pending.  Follow cultures Had been on prednisone  since December, now off. Send cortisol. May need stress roids but hesitant to start considering surgical wound. Neurosurgery does not feel as though the wound is the source.  Wound care and further imaging as directed by surgical team. Placing new vac today.  Holding antihypertensives and diuretics for now One dose IV tylenol  and bair hugger for ongoing fevers, rigors.   PAF w/ RVR (new); now back in NSR. No chest pain and asymptomatic  Plan ECHO pending Ensure K > 4 and Mg > 2 Cont tele  Hold off from systemic ac for now s/p lumb spine surg, wound still oozing.  Acute respiratory failure, secondary to pneumonia and atelectasis New supplemental oxygen need 3L Improved with antibiotics Plan Incentive spirometry Mobilize Antibiotics as above  Status post redo of laminectomy L3-L4 with posterior fixation of L2-S1 Plan Tylenol , oxycodone , dilaudid  PRN. Robaxin  PRN Gabapentin .  Wound/drain management per neurosurgical team PT  Acute metabolic encephalopathy secondary sepsis: This is improved some Plan Supportive care Treat infection Continue Provigil    Postoperative anemia.  Has slowly drifted from baseline of 12.5-7.5 got 1 unit of PRBCs on 5/3 Plan Trend CBC Watch for evidence of bleeding Criteria for transfusion hemoglobin less than 7 or evidence of active bleeding with hemodynamic instability  History of hypertension, hyperlipidemia, and peripheral vascular disease Plan Holding his Norvasc , Diovan and hydrochlorothiazide .   Crestor   Hypothyroidism Plan Synthroid   BPH Plan Can continue to take his finasteride  and  Flomax  as able  History of GERD Plan PPI  Best Practice (right click and "Reselect all SmartList Selections" daily)   Diet/type: Regular consistency (see orders) DVT prophylaxis LMWH Pressure ulcer(s): N/A GI prophylaxis: PPI Lines: N/A Foley:  N/A Code Status:  full code Last date of multidisciplinary goals of care discussion [pending]  Critical care time: 38 minutes     Roz Cornelia, AGACNP-BC [facility]  See Amion for personal pager PCCM on call pager [phone] until 7pm. Please call Elink 7p-7a. [phone]  12/18/2023 8:22 AM

## 2023-12-18 NOTE — Progress Notes (Signed)
 Echocardiogram 2D Echocardiogram has been performed.  Norman Lambert 12/18/2023, 10:04 AM

## 2023-12-18 NOTE — Progress Notes (Signed)
 Contacted pharmacy regarding infiltration on left forearm IV where amiodarone was being infused. IV site looks swollen and red above IV site. Pharmacy put in hylenex orders to treat it.

## 2023-12-18 NOTE — Progress Notes (Signed)
 Patient ID: FAHED GAFFKE, male   DOB: 1951-03-10, 73 y.o.   MRN: [id number] Events of the weekend noted.  I have spoken at length with Karver and his wife.  Gram-negative rod bacteremia and septicemia.  He is on Rocephin.  I inspected his wound and other than a 1 cm area of dehiscence where it is draining bloody serosanguineous drainage, the wound itself looks good.  There is no erythema or tenderness or induration.  The drainage does not look purulent in any way.  I am not convinced this is the source of his bacteremia.  He had an external wound VAC in place for a week, and I would suspect if he had a deep wound infection the drainage would look more purulent by now.  He also simply does not have the amount of back pain that we would typically see with a deep wound infection.  In fact, his pain is actually better.  I replaced an external wound VAC at the bedside as this will help keep the wound dry and clean and hopefully allow the edges to reapproximate.  I think this is better than laying on wet gauze and tape.

## 2023-12-19 DIAGNOSIS — J9601 Acute respiratory failure with hypoxia: Secondary | ICD-10-CM | POA: Diagnosis not present

## 2023-12-19 DIAGNOSIS — I480 Paroxysmal atrial fibrillation: Secondary | ICD-10-CM | POA: Diagnosis not present

## 2023-12-19 DIAGNOSIS — A419 Sepsis, unspecified organism: Secondary | ICD-10-CM | POA: Diagnosis not present

## 2023-12-19 DIAGNOSIS — Z981 Arthrodesis status: Secondary | ICD-10-CM | POA: Diagnosis not present

## 2023-12-19 DIAGNOSIS — E785 Hyperlipidemia, unspecified: Secondary | ICD-10-CM

## 2023-12-19 DIAGNOSIS — M5440 Lumbago with sciatica, unspecified side: Secondary | ICD-10-CM

## 2023-12-19 DIAGNOSIS — I10 Essential (primary) hypertension: Secondary | ICD-10-CM

## 2023-12-19 DIAGNOSIS — R7881 Bacteremia: Secondary | ICD-10-CM

## 2023-12-19 DIAGNOSIS — B964 Proteus (mirabilis) (morganii) as the cause of diseases classified elsewhere: Secondary | ICD-10-CM | POA: Diagnosis not present

## 2023-12-19 DIAGNOSIS — R6521 Severe sepsis with septic shock: Secondary | ICD-10-CM | POA: Diagnosis not present

## 2023-12-19 LAB — BASIC METABOLIC PANEL WITH GFR
Anion gap: 11 (ref 5–15)
BUN: 12 mg/dL (ref 8–23)
CO2: 22 mmol/L (ref 22–32)
Calcium: 7.9 mg/dL — ABNORMAL LOW (ref 8.9–10.3)
Chloride: 99 mmol/L (ref 98–111)
Creatinine, Ser: 0.51 mg/dL — ABNORMAL LOW (ref 0.61–1.24)
GFR, Estimated: >60 mL/min (ref >60)
Glucose, Bld: 175 mg/dL — ABNORMAL HIGH (ref 70–99)
Potassium: 4 mmol/L (ref 3.5–5.1)
Sodium: 132 mmol/L — ABNORMAL LOW (ref 135–145)

## 2023-12-19 LAB — GLUCOSE, CAPILLARY
Glucose-Capillary: 180 mg/dL — ABNORMAL HIGH (ref 70–99)
Glucose-Capillary: 147 mg/dL — ABNORMAL HIGH (ref 70–99)
Glucose-Capillary: 141 mg/dL — ABNORMAL HIGH (ref 70–99)
Glucose-Capillary: 170 mg/dL — ABNORMAL HIGH (ref 70–99)
Glucose-Capillary: 143 mg/dL — ABNORMAL HIGH (ref 70–99)
Glucose-Capillary: 118 mg/dL — ABNORMAL HIGH (ref 70–99)

## 2023-12-19 LAB — CBC
HCT: 26.4 % — ABNORMAL LOW (ref 39.0–52.0)
Hemoglobin: 8.7 g/dL — ABNORMAL LOW (ref 13.0–17.0)
MCH: 30.3 pg (ref 26.0–34.0)
MCHC: 33 g/dL (ref 30.0–36.0)
MCV: 92 fL (ref 80.0–100.0)
Platelets: 156 10*3/uL (ref 150–400)
RBC: 2.87 MIL/uL — ABNORMAL LOW (ref 4.22–5.81)
RDW: 14 % (ref 11.5–15.5)
WBC: 14.2 10*3/uL — ABNORMAL HIGH (ref 4.0–10.5)
nRBC: 0 % (ref 0.0–0.2)

## 2023-12-19 LAB — PHOSPHORUS: Phosphorus: 4.1 mg/dL (ref 2.5–4.6)

## 2023-12-19 LAB — MAGNESIUM: Magnesium: 2.1 mg/dL (ref 1.7–2.4)

## 2023-12-19 MED ORDER — PREDNISONE 10 MG PO TABS
10.0000 mg | ORAL_TABLET | Freq: Every day | ORAL | Status: AC
  Administered 2023-12-27 – 2024-01-02 (×7): 10 mg via ORAL
  Filled 2023-12-19 (×7): qty 1

## 2023-12-19 MED ORDER — CEFEPIME HCL 2 G IV SOLR
2.0000 g | Freq: Three times a day (TID) | INTRAVENOUS | Status: DC
  Administered 2023-12-19 – 2024-01-14 (×78): 2 g via INTRAVENOUS
  Filled 2023-12-19 (×77): qty 12.5

## 2023-12-19 MED ORDER — PREDNISONE 2.5 MG PO TABS
2.5000 mg | ORAL_TABLET | Freq: Every day | ORAL | Status: AC
  Administered 2024-01-10 – 2024-01-12 (×3): 2.5 mg via ORAL
  Filled 2023-12-19 (×7): qty 1

## 2023-12-19 MED ORDER — PREDNISONE 5 MG PO TABS
15.0000 mg | ORAL_TABLET | Freq: Every day | ORAL | Status: AC
  Administered 2023-12-20 – 2023-12-26 (×7): 15 mg via ORAL
  Filled 2023-12-19: qty 1
  Filled 2023-12-19: qty 2
  Filled 2023-12-19 (×2): qty 1
  Filled 2023-12-19: qty 2
  Filled 2023-12-19: qty 1.5
  Filled 2023-12-19: qty 2
  Filled 2023-12-19: qty 1
  Filled 2023-12-19: qty 1.5

## 2023-12-19 MED ORDER — PREDNISONE 10 MG PO TABS
5.0000 mg | ORAL_TABLET | Freq: Every day | ORAL | Status: AC
  Administered 2024-01-04 – 2024-01-09 (×6): 5 mg via ORAL
  Filled 2023-12-19 (×8): qty 1

## 2023-12-19 NOTE — Progress Notes (Signed)
 Occupational Therapy Treatment Patient Details Name: Norman Lambert MRN: [id number] DOB: April 28, 1951 Today's Date: 12/19/2023   History of present illness 73 yo male admitted 12/05/23 with LB and severe back pain. 12/08/23 redo of laminectomy L3-4 with posterior fixation L2-S1; recovery complicated by pain and hypotension; 5/2 hypotension and tachycardia. 5/3 transfers to ICU, pressors, L2-3 TVP fx. 5/5 VAC placed. PMHx: gout, arthritis, HTN, HLD, HTN, PVD, RBBB, Rt reverse TSA, sleep apnea, 11/13/23 PLIF L3-4   OT comments  Pt making slow progress toward goals this session, needed mod A for UB ADL and total A for posterior pericare in standing. Pt needing max A for bed mobility and mod-max A +2 for transfers with RW. Pt able to stand x2, at longest for ~72min. Pt with recall of 2/3 back prec, reiterated precautions during session and brace donned. Pt presenting with impairments listed below, will follow acutely. Patient will benefit from continued inpatient follow up therapy, <3 hours/day to maximize safety/ind with ADL/functional mobility.       If plan is discharge home, recommend the following:  Two people to help with walking and/or transfers;Two people to help with bathing/dressing/bathroom   Equipment Recommendations  Other (comment) (defer)    Recommendations for Other Services PT consult;Rehab consult    Precautions / Restrictions Precautions Precautions: Back;Fall;Other (comment) Precaution Booklet Issued: No Recall of Precautions/Restrictions: Impaired Precaution/Restrictions Comments: recalls 2/3 back prec Required Braces or Orthoses: Spinal Brace Spinal Brace: Lumbar corset;Applied in sitting position Restrictions Weight Bearing Restrictions Per Provider Order: No       Mobility Bed Mobility Overal bed mobility: Needs Assistance Bed Mobility: Rolling, Sidelying to Sit, Sit to Sidelying Rolling: Mod assist Sidelying to sit: Mod assist, HOB elevated, Used rails Supine to  sit: Max assist Sit to supine: Max assist        Transfers Overall transfer level: Needs assistance Equipment used: Rolling walker (2 wheels) Transfers: Sit to/from Stand Sit to Stand: Mod assist, Max assist, +2 physical assistance                 Balance Overall balance assessment: Needs assistance Sitting-balance support: Feet supported, Bilateral upper extremity supported Sitting balance-Leahy Scale: Fair Sitting balance - Comments: posterior lean needing ~ CGA at best EOB   Standing balance support: Bilateral upper extremity supported, During functional activity, Reliant on assistive device for balance Standing balance-Leahy Scale: Poor Standing balance comment: reliant on RW                           ADL either performed or assessed with clinical judgement   ADL Overall ADL's : Needs assistance/impaired                 Upper Body Dressing : Moderate assistance;Sitting Upper Body Dressing Details (indicate cue type and reason): donning clean gown     Toilet Transfer: Moderate assistance;+2 for physical assistance;Maximal assistance Toilet Transfer Details (indicate cue type and reason): standing and taking side steps to L Toileting- Clothing Manipulation and Hygiene: Total assistance;Sit to/from stand Toileting - Clothing Manipulation Details (indicate cue type and reason): posterior pericare in standing     Functional mobility during ADLs: Moderate assistance;Maximal assistance;+2 for physical assistance      Extremity/Trunk Assessment Upper Extremity Assessment Upper Extremity Assessment: Generalized weakness   Lower Extremity Assessment Lower Extremity Assessment: Defer to PT evaluation        Vision   Vision Assessment?: No apparent visual deficits   Perception  Perception Perception: Within Functional Limits   Praxis Praxis Praxis: WFL   Communication Communication Communication: No apparent difficulties   Cognition  Arousal: Alert Behavior During Therapy: Flat affect Cognition: No apparent impairments                               Following commands: Impaired Following commands impaired: Follows one step commands with increased time      Cueing   Cueing Techniques: Verbal cues, Gestural cues, Tactile cues  Exercises      Shoulder Instructions       General Comments VSS on 2L O2    Pertinent Vitals/ Pain       Pain Assessment Pain Assessment: Faces Pain Score: 5  Faces Pain Scale: Hurts little more Pain Location: back Pain Descriptors / Indicators: Aching, Guarding Pain Intervention(s): Limited activity within patient's tolerance, Monitored during session, Repositioned  Home Living                                          Prior Functioning/Environment              Frequency  Min 2X/week        Progress Toward Goals  OT Goals(current goals can now be found in the care plan section)  Progress towards OT goals: Progressing toward goals  Acute Rehab OT Goals Patient Stated Goal: none stated OT Goal Formulation: With patient Time For Goal Achievement: 12/23/23 Potential to Achieve Goals: Good ADL Goals Pt Will Perform Grooming: with set-up;sitting Pt Will Perform Upper Body Bathing: with set-up;sitting Pt Will Perform Lower Body Bathing: with min assist;sit to/from stand Pt Will Transfer to Toilet: with contact guard assist;ambulating Pt Will Perform Toileting - Clothing Manipulation and hygiene: with contact guard assist;sit to/from stand  Plan      Co-evaluation                 AM-PAC OT "6 Clicks" Daily Activity     Outcome Measure   Help from another person eating meals?: A Little Help from another person taking care of personal grooming?: A Little Help from another person toileting, which includes using toliet, bedpan, or urinal?: Total Help from another person bathing (including washing, rinsing, drying)?: A Lot Help  from another person to put on and taking off regular upper body clothing?: A Lot Help from another person to put on and taking off regular lower body clothing?: A Lot 6 Click Score: 13    End of Session Equipment Utilized During Treatment: Rolling walker (2 wheels);Oxygen;Back brace;Gait belt  OT Visit Diagnosis: Unsteadiness on feet (R26.81);Muscle weakness (generalized) (M62.81);Pain   Activity Tolerance Patient limited by fatigue;Patient limited by pain   Patient Left with family/visitor present;in bed;with call bell/phone within reach;with bed alarm set   Nurse Communication Mobility status (O2 left off)        Time: 1052-1130 OT Time Calculation (min): 38 min  Charges: OT General Charges $OT Visit: 1 Visit OT Treatments $Self Care/Home Management : 23-37 mins $Therapeutic Activity: 8-22 mins  Anahid Eskelson K, OTD, OTR/L [employer] Preferred Acute Rehab [phone]   Terrick Allred K Koonce 12/19/2023, 4:00 PM

## 2023-12-19 NOTE — Progress Notes (Addendum)
 NAME:  Norman Lambert, MRN:  [id number], DOB:  11/11/1950, LOS: 14 ADMISSION DATE:  12/05/2023, CONSULTATION DATE:  12/16/2023 REFERRING MD: Elgergawy - TRH, CHIEF COMPLAINT: Septic shock    History of Present Illness:  73 year old male patient who was admitted on 4/25 for reduction and internal fixation of previous fusions involving L2-S1. Postoperative course initially seemed unremarkable pain had improved still not able to walk as of 4/29 on 4/30 began to have new increased pain involving the left foot, on 5/1 early in the morning continued to report 10 out of 10 pain seemingly not responding to as needed narcotics.  Imaging of the back was unremarkable, he continued to have drain VAC in place in the evening hours of 5/2 began to have borderline hypotension with tachycardia 70 have shakes and tremors in the evening, temperature spike to 103 was started on IV cefepime and vancomycin  with concern about sepsis white blood cell count elevated to 17,000 there was significant wound erythema with serosanguineous foul-smelling drainage from the surgical site.  Initially internal medicine was consulted but after administering about a liter of crystalloid the patient remained hypotensive in the 70s so critical care was asked to evaluate for possible transfer to the intensive care  Pertinent Medical History:  Hypertension, GERD, gout, hyperlipidemia,-hypothyroidism peripheral vascular disease, sleep apnea, right bundle branch block prior lumbar back surgery  Significant Hospital Events: Including procedures, antibiotic start and stop dates in addition to other pertinent events   4/25 Extensive L2-S1 lumbar surgery 4/30 New onset pain  5/1 Ongoing pain borderline blood pressure 5/2 Pain, leukocytosis fever, 5/3 Hypotension, not responding to crystalloid started on broad-spectrum antibiotics wound assessed by surgical team culture sent imaging pending ICU transfer. CT imaging of lumbar spine was negative for  definitive abscess there were stable fractures at L3 and L4 with possible new fracture involving the transverse process of L2 and L3 on the left postoperative changes noted.CT angio of chest was negative for pulmonary emboli had bibasilar airspace disease the right was a little worse than the left consistent with possible pneumonia there was aortic aneurysmal dilation of the ascending aorta measuring at 4.5 cm 5/4 Weaned off norepinephrine, Blood cultures from 1 out of 2 samples growing gram-negative rods with BC ID showing both proteus and Enterobacterales, MRSA PCR pending 5/5 Remains on low dose NE. Febrile. BCx growing proteus.   Interim History / Subjective:  No significant events overnight Feeling well overall today, appetite slowly improving, wants a steak Ate some breakfast this AM, tolerated well No BM since surgery Slowly decreasing pressor requirements (NE ) No fevers, bump in WBC, attributing this to SDS initiation Wife at bedside  Objective:   Blood pressure (!) 121/56, pulse 62, temperature (!) 96.8 F (36 C), temperature source Axillary, resp. rate 10, height 6' (1.829 m), weight 97.1 kg, SpO2 99%.        Intake/Output Summary (Last 24 hours) at 12/19/2023 0717 Last data filed at 12/19/2023 0700 Gross per 24 hour  Intake 2307.5 ml  Output 350 ml  Net 1957.5 ml   Filed Weights   12/08/23 0957 12/17/23 0545 12/18/23 0615  Weight: 101.6 kg 92.2 kg 97.1 kg   Physical Examination: General: Acutely ill-appearing elderly man in NAD. Pleasant and conversant. HEENT: [place]/AT, anicteric sclera, PERRL, moist mucous membranes. Neuro: Awake, oriented x 4. Responds to verbal stimuli. Following commands consistently. Moves all 4 extremities spontaneously. Chronic L foot drop. Strength equal in BUE. Prevena incisional vac to lumbar surgical site with dark  sanguinous output.  CV: Mildly bradycardic to high 50s, no m/g/r. PULM: Breathing even and unlabored on 3LNC. Lung fields with  scattered rhonchi on R, diminished at bases. GI: Soft, nontender, mildly distended. Slightly hypoactive bowel sounds. Extremities: Bilateral UE edema noted, R > L (especially R hand). Trace BLE edema. Skin: Warm/dry, scattered ecchymosis to BUE R > L.  Resolved Hospital Problem List:     Assessment & Plan:  Septic shock with gram-negative rod bacteremia, Proteus: primary source unclear. Wound bed does not appear infected per neurosurgery. Urine looked OK. Possible PNA; responded nicely to fluid resuscitation and antibiotics with cleared LA. Imaging negative for abscess, no surgical intervention required. - Goal MAP > 65 - Fluid resuscitation as tolerated - Levophed titrated to goal MAP, weaning off as able - AM Cortisol WNL, added SDS 5/5 given persistent pressor requirement, taper to off - Trend WBC (increased 5/6AM, suspect 2/2 SDS addition), fever curve - F/u Cx data - Continue broad-spectrum antibiotics (cefepime), initially narrowed to ceftriaxone from cefepime/vanc but now +klebsiella aerogenes in wound Cx - ID following, appreciate recommendations  PAF w/ RVR (new); now back in NSR. No chest pain and asymptomatic  Echo 5/5 with EF 55-60%, normal LV/RV function, no RWMAs, trivial MR/AR, large pericardial fat pad. CHA2DS2-VASc Score 3 (age, HTN history, PVD). - Cardiac monitoring - Optimize electrolytes for K > 4, Mg > 2 - Defer AC plan to NSGY in the immediate postoperative period  Acute respiratory failure, secondary to pneumonia and atelectasis New supplemental oxygen need 3L. - Supplemental O2 support for SpO2 > 90% - Pulmonary hygiene  (IS, OOB/mobilize) - Continue ceftriaxone as above  Status post redo of laminectomy L3-L4 with posterior fixation of L2-S1 4/25 Initial surgery date 11/13/2023. - POD#11 from revision - Postoperative management per NSGY - WV per NSGY - Multimodal pain management with APAP, Oxycodone , Dilaudid  PRN/Robaxin  PRN; maximize adjuncts as able and  avoid sedation - PT/OT as appropriate postoperatively  Acute metabolic encephalopathy secondary sepsis, improving - Source control as above - Correct metabolic derangements - Avoid sedating medications as able - Limit interruptions to sleep/wake cycle as able - Continue Provigil   Postoperative anemia.  Has slowly drifted from baseline of 12.5-7.5; got 1U PRBCs 5/3 - Trend H&H - Monitor for signs of active bleeding - Transfuse for Hgb < 7.0 or hemodynamically significant bleeding  HTN HLD PVD - Continue home home antihypertensives/diuretics (Norvasc , Diovan, hydrochlorothiazide ) - Continue Crestor   Hypothyroidism - Continue Synthroid   BPH - Continue finasteride , Flomax  as able  GERD - PPI  Best Practice: (right click and "Reselect all SmartList Selections" daily)   Diet/type: Regular consistency (see orders) DVT prophylaxis LMWH Pressure ulcer(s): N/A GI prophylaxis: PPI Lines: N/A Foley:  N/A Code Status:  full code Last date of multidisciplinary goals of care discussion [Per Primary Team]  Critical care time:    The patient is critically ill with multiple organ system failure and requires high complexity decision making for assessment and support, frequent evaluation and titration of therapies, advanced monitoring, review of radiographic studies and interpretation of complex data.   Critical Care Time devoted to patient care services, exclusive of separately billable procedures, described in this note is 37 minutes.  Star East, PA-C [facility] 12/19/23 7:17 AM  Please see [email or web address] for pager details.  From 7A-7P if no response, please call [phone] After hours, please call ELink [phone]

## 2023-12-19 NOTE — Progress Notes (Signed)
 [facility]  Date of Admission:  12/05/2023   Total days of inpatient antibiotics 4  Principal Problem:   Back pain Active Problems:   S/P lumbar fusion   Septic shock [facility])          Assessment: 73 year old male admitted with: #Hospital onset bacteremia with Proteus mirabilis likely secondary to postop wound - Patient had undergone lumbar decompression and fusion on 3/31 with Dr. Rochelle Chu is doing well until 4 days prior to admission started having increased pain.  MRI in office unremarkable.  Admitted for further workup as pain continued and there was kyphosis on plain film - Patient underwent redo decompression of L2-L4, exploration fusion L3-L4 as well.   [facility] course complicated by increased pain subsequent fever and found Blood cultures on 5/3 grew Proteus mirabilis - CT lumbar spine on 5/3 showed postsurgical changes of the lumbar spine, no definite evidence of abscess "limited due to hardware artifact. . - Neurosurgery following noted superficial wound infection, no evidence of deeper collection with palpation.  Wound culture fgroing proteus and klebsiella aerogenes Recommendations:  -D/C ceftriaxone -Start cefepime -follow OR Cx -standard precautions   Evaluation of this patient requires complex antimicrobial therapy evaluation and counseling + isolation needs for disease transmission risk assessment and mitigation     Microbiology:   Antibiotics:   Vancomycin  5/30 Ctx 5/2- Cultures: Blood 5/3/ 1/2 set proteus mirabliis   SUBJECTIVE: Resting in bed. Wife at bedside  Interval: Afebrile overnight. Wbc 14.2k  Review of Systems: Review of Systems  All other systems reviewed and are negative.    Scheduled Meds:  vitamin C   1,000 mg Oral Daily   Chlorhexidine  Gluconate Cloth  6 each Topical Daily   cyanocobalamin   1,000 mcg Oral Daily   enoxaparin  (LOVENOX ) injection  50 mg Subcutaneous Q24H   feeding supplement  237 mL  Oral BID BM   finasteride   5 mg Oral Daily   folic acid   1 mg Oral Daily   gabapentin   300 mg Oral TID   insulin aspart  2-6 Units Subcutaneous Q4H   levothyroxine   112 mcg Oral Q0600   loratadine   10 mg Oral Daily   modafinil   200 mg Oral Daily   oxyCODONE   10 mg Oral Q12H   pantoprazole   40 mg Oral Daily   [START ON 12/20/2023] predniSONE   15 mg Oral Q breakfast   Followed by   [name] ON 12/27/2023] predniSONE   10 mg Oral Q breakfast   Followed by   [name] ON 01/03/2024] predniSONE   5 mg Oral Q breakfast   Followed by   [name] ON 01/10/2024] predniSONE   2.5 mg Oral Q breakfast   rosuvastatin   5 mg Oral Daily   senna  1 tablet Oral BID   sodium chloride  flush  3 mL Intravenous Q12H   tamsulosin   0.4 mg Oral Daily   zinc  sulfate (50mg  elemental zinc )  220 mg Oral Daily   Continuous Infusions:  ceFEPime (MAXIPIME) IV 2 g (12/19/23 1253)   norepinephrine (LEVOPHED) Adult infusion 3 mcg/min (12/19/23 1200)   sodium chloride      PRN Meds:.acetaminophen  **OR** acetaminophen , bisacodyl , bisacodyl , HYDROmorphone  (DILAUDID ) injection, ipratropium-albuterol , menthol -cetylpyridinium **OR** phenol, methocarbamol  **OR** methocarbamol  (ROBAXIN ) injection, ondansetron  **OR** ondansetron  (ZOFRAN ) IV, oxyCODONE , sodium chloride , sodium chloride  flush Allergies  Allergen Reactions   Benzoic Acid Hives and Rash    Positive Patch Test 11/12/20   Imidurea Rash    Positive Patch Test 11/12/20  Urea  Rash    Positive Patch Test 11/12/20   Other Rash and Other (See Comments)    " DARK DYES " [ UNSPECIFIED DYE ]    OBJECTIVE: Vitals:   12/19/23 1200 12/19/23 1215 12/19/23 1230 12/19/23 1245  BP: (!) 93/46 (!) 97/49 (!) 109/55 (!) 112/50  Pulse: 63 61 69 64  Resp: 18 (!) 9 14 12   Temp:      TempSrc:      SpO2: 95% 95% 95% 96%  Weight:      Height:       Body mass index is 29.03 kg/m.  Physical Exam Constitutional:      General: He is not in acute distress.    Appearance: He is normal  weight. He is not toxic-appearing.  HENT:     Head: Normocephalic and atraumatic.     Right Ear: External ear normal.     Left Ear: External ear normal.     Nose: No congestion or rhinorrhea.     Mouth/Throat:     Mouth: Mucous membranes are moist.     Pharynx: Oropharynx is clear.  Eyes:     Extraocular Movements: Extraocular movements intact.     Conjunctiva/sclera: Conjunctivae normal.     Pupils: Pupils are equal, round, and reactive to light.  Cardiovascular:     Rate and Rhythm: Normal rate and regular rhythm.     Heart sounds: No murmur heard.    No friction rub. No gallop.  Pulmonary:     Effort: Pulmonary effort is normal.     Breath sounds: Normal breath sounds.  Abdominal:     General: Abdomen is flat. Bowel sounds are normal.     Palpations: Abdomen is soft.  Musculoskeletal:        General: No swelling.     Cervical back: Normal range of motion and neck supple.  Skin:    General: Skin is warm and dry.  Neurological:     General: No focal deficit present.     Mental Status: He is oriented to person, place, and time.  Psychiatric:        Mood and Affect: Mood normal.       Lab Results Lab Results  Component Value Date   WBC 14.2 (H) 12/19/2023   HGB 8.7 (L) 12/19/2023   HCT 26.4 (L) 12/19/2023   MCV 92.0 12/19/2023   PLT 156 12/19/2023    Lab Results  Component Value Date   CREATININE 0.51 (L) 12/19/2023   BUN 12 12/19/2023   NA 132 (L) 12/19/2023   K 4.0 12/19/2023   CL 99 12/19/2023   CO2 22 12/19/2023    Lab Results  Component Value Date   ALT 41 12/16/2023   AST 49 (H) 12/16/2023   ALKPHOS 89 12/16/2023   BILITOT 0.6 12/16/2023        Orlie Bjornstad, MD [facility] 12/19/2023, 3:13 PM

## 2023-12-19 NOTE — Progress Notes (Signed)
 Patient ID: Norman Lambert, male   DOB: Aug 11, 1951, 73 y.o.   MRN: [id number] Looks much better.  Sitting up and eating not having much pain.  External wound VAC in place.  On IV antibiotics.

## 2023-12-19 NOTE — Plan of Care (Signed)
  Problem: Clinical Measurements: Goal: Ability to maintain clinical measurements within normal limits will improve Outcome: Progressing Goal: Will remain free from infection Outcome: Progressing Goal: Diagnostic test results will improve Outcome: Progressing Goal: Respiratory complications will improve Outcome: Progressing Goal: Cardiovascular complication will be avoided Outcome: Progressing   Problem: Nutrition: Goal: Adequate nutrition will be maintained Outcome: Progressing   Problem: Coping: Goal: Level of anxiety will decrease Outcome: Progressing   Problem: Elimination: Goal: Will not experience complications related to bowel motility Outcome: Progressing   Problem: Pain Managment: Goal: General experience of comfort will improve and/or be controlled Outcome: Progressing   Problem: Safety: Goal: Ability to remain free from injury will improve Outcome: Progressing   Problem: Skin Integrity: Goal: Risk for impaired skin integrity will decrease Outcome: Progressing

## 2023-12-20 DIAGNOSIS — I480 Paroxysmal atrial fibrillation: Secondary | ICD-10-CM | POA: Diagnosis not present

## 2023-12-20 DIAGNOSIS — I10 Essential (primary) hypertension: Secondary | ICD-10-CM | POA: Diagnosis not present

## 2023-12-20 DIAGNOSIS — A419 Sepsis, unspecified organism: Secondary | ICD-10-CM | POA: Diagnosis not present

## 2023-12-20 DIAGNOSIS — R6521 Severe sepsis with septic shock: Secondary | ICD-10-CM | POA: Diagnosis not present

## 2023-12-20 LAB — CBC WITH DIFFERENTIAL/PLATELET
Abs Immature Granulocytes: 0.06 10*3/uL (ref 0.00–0.07)
Basophils Absolute: 0 10*3/uL (ref 0.0–0.1)
Basophils Relative: 0 %
Eosinophils Absolute: 0 10*3/uL (ref 0.0–0.5)
Eosinophils Relative: 0 %
HCT: 23.6 % — ABNORMAL LOW (ref 39.0–52.0)
Hemoglobin: 7.9 g/dL — ABNORMAL LOW (ref 13.0–17.0)
Immature Granulocytes: 1 %
Lymphocytes Relative: 4 %
Lymphs Abs: 0.4 10*3/uL — ABNORMAL LOW (ref 0.7–4.0)
MCH: 31.1 pg (ref 26.0–34.0)
MCHC: 33.5 g/dL (ref 30.0–36.0)
MCV: 92.9 fL (ref 80.0–100.0)
Monocytes Absolute: 0.4 10*3/uL (ref 0.1–1.0)
Monocytes Relative: 5 %
Neutro Abs: 8 10*3/uL — ABNORMAL HIGH (ref 1.7–7.7)
Neutrophils Relative %: 90 %
Platelets: 164 10*3/uL (ref 150–400)
RBC: 2.54 MIL/uL — ABNORMAL LOW (ref 4.22–5.81)
RDW: 13.8 % (ref 11.5–15.5)
WBC: 8.9 10*3/uL (ref 4.0–10.5)
nRBC: 0 % (ref 0.0–0.2)

## 2023-12-20 LAB — GLUCOSE, CAPILLARY
Glucose-Capillary: 111 mg/dL — ABNORMAL HIGH (ref 70–99)
Glucose-Capillary: 99 mg/dL (ref 70–99)
Glucose-Capillary: 110 mg/dL — ABNORMAL HIGH (ref 70–99)
Glucose-Capillary: 115 mg/dL — ABNORMAL HIGH (ref 70–99)
Glucose-Capillary: 153 mg/dL — ABNORMAL HIGH (ref 70–99)

## 2023-12-20 LAB — BASIC METABOLIC PANEL WITH GFR
Anion gap: 9 (ref 5–15)
BUN: 16 mg/dL (ref 8–23)
CO2: 25 mmol/L (ref 22–32)
Calcium: 7.9 mg/dL — ABNORMAL LOW (ref 8.9–10.3)
Chloride: 100 mmol/L (ref 98–111)
Creatinine, Ser: 0.56 mg/dL — ABNORMAL LOW (ref 0.61–1.24)
GFR, Estimated: >60 mL/min (ref >60)
Glucose, Bld: 104 mg/dL — ABNORMAL HIGH (ref 70–99)
Potassium: 3.6 mmol/L (ref 3.5–5.1)
Sodium: 134 mmol/L — ABNORMAL LOW (ref 135–145)

## 2023-12-20 MED ORDER — HYDROMORPHONE HCL 1 MG/ML IJ SOLN
0.5000 mg | INTRAMUSCULAR | Status: DC | PRN
  Administered 2023-12-20 – 2024-01-04 (×17): 1 mg via INTRAVENOUS
  Filled 2023-12-20 (×19): qty 1

## 2023-12-20 MED ORDER — MELATONIN 3 MG PO TABS
3.0000 mg | ORAL_TABLET | Freq: Every day | ORAL | Status: DC
  Administered 2023-12-20 – 2024-01-04 (×15): 3 mg via ORAL
  Filled 2023-12-20 (×16): qty 1

## 2023-12-20 MED ORDER — MIDODRINE HCL 5 MG PO TABS
5.0000 mg | ORAL_TABLET | Freq: Three times a day (TID) | ORAL | Status: DC
  Administered 2023-12-20 (×2): 5 mg via ORAL
  Filled 2023-12-20 (×2): qty 1

## 2023-12-20 MED ORDER — INSULIN ASPART 100 UNIT/ML IJ SOLN: 0.0000 [IU] | Freq: Every day | INTRAMUSCULAR | Status: DC

## 2023-12-20 MED ORDER — OXYCODONE HCL ER 10 MG PO T12A
20.0000 mg | EXTENDED_RELEASE_TABLET | Freq: Two times a day (BID) | ORAL | Status: DC
  Administered 2023-12-20 – 2024-01-05 (×29): 20 mg via ORAL
  Filled 2023-12-20 (×3): qty 1
  Filled 2023-12-20: qty 2
  Filled 2023-12-20: qty 1
  Filled 2023-12-20 (×2): qty 2
  Filled 2023-12-20 (×3): qty 1
  Filled 2023-12-20: qty 2
  Filled 2023-12-20: qty 1
  Filled 2023-12-20 (×2): qty 2
  Filled 2023-12-20 (×3): qty 1
  Filled 2023-12-20 (×2): qty 2
  Filled 2023-12-20: qty 1
  Filled 2023-12-20: qty 2
  Filled 2023-12-20 (×4): qty 1
  Filled 2023-12-20: qty 2
  Filled 2023-12-20 (×6): qty 1

## 2023-12-20 MED ORDER — LIDOCAINE 5 % EX PTCH
2.0000 | MEDICATED_PATCH | CUTANEOUS | Status: DC
  Administered 2023-12-20 – 2023-12-21 (×2): 2 via TRANSDERMAL
  Administered 2023-12-22: 1 via TRANSDERMAL
  Administered 2023-12-23 – 2024-01-04 (×13): 2 via TRANSDERMAL
  Filled 2023-12-20 (×17): qty 2

## 2023-12-20 MED ORDER — INSULIN ASPART 100 UNIT/ML IJ SOLN
0.0000 [IU] | Freq: Three times a day (TID) | INTRAMUSCULAR | Status: DC
  Administered 2023-12-21 – 2023-12-22 (×2): 1 [IU] via SUBCUTANEOUS
  Administered 2023-12-23: 2 [IU] via SUBCUTANEOUS
  Administered 2023-12-26 – 2023-12-31 (×3): 1 [IU] via SUBCUTANEOUS

## 2023-12-20 MED ORDER — MELATONIN 3 MG PO TABS
3.0000 mg | ORAL_TABLET | Freq: Once | ORAL | Status: AC
  Administered 2023-12-20: 3 mg via ORAL
  Filled 2023-12-20: qty 1

## 2023-12-20 MED ORDER — POTASSIUM CHLORIDE CRYS ER 20 MEQ PO TBCR
40.0000 meq | EXTENDED_RELEASE_TABLET | Freq: Once | ORAL | Status: AC
  Administered 2023-12-20: 40 meq via ORAL
  Filled 2023-12-20: qty 2

## 2023-12-20 MED ORDER — ACETAMINOPHEN 325 MG PO TABS
650.0000 mg | ORAL_TABLET | Freq: Three times a day (TID) | ORAL | Status: DC
  Administered 2023-12-20 – 2023-12-25 (×15): 650 mg via ORAL
  Filled 2023-12-20 (×15): qty 2

## 2023-12-20 NOTE — Progress Notes (Signed)
 NAME:  Norman Lambert, MRN:  [id number], DOB:  1950/08/24, LOS: 15 ADMISSION DATE:  12/05/2023, CONSULTATION DATE:  12/16/2023 REFERRING MD: Elgergawy - TRH, CHIEF COMPLAINT: Septic shock    History of Present Illness:  72 year old male patient who was admitted on 4/25 for reduction and internal fixation of previous fusions involving L2-S1. Postoperative course initially seemed unremarkable pain had improved still not able to walk as of 4/29 on 4/30 began to have new increased pain involving the left foot, on 5/1 early in the morning continued to report 10 out of 10 pain seemingly not responding to as needed narcotics.  Imaging of the back was unremarkable, he continued to have drain VAC in place in the evening hours of 5/2 began to have borderline hypotension with tachycardia 70 have shakes and tremors in the evening, temperature spike to 103 was started on IV cefepime and vancomycin  with concern about sepsis white blood cell count elevated to 17,000 there was significant wound erythema with serosanguineous foul-smelling drainage from the surgical site.  Initially internal medicine was consulted but after administering about a liter of crystalloid the patient remained hypotensive in the 70s so critical care was asked to evaluate for possible transfer to the intensive care  Pertinent Medical History:  Hypertension, GERD, gout, hyperlipidemia,-hypothyroidism peripheral vascular disease, sleep apnea, right bundle branch block prior lumbar back surgery  Significant Hospital Events: Including procedures, antibiotic start and stop dates in addition to other pertinent events   4/25 Extensive L2-S1 lumbar surgery 4/30 New onset pain  5/1 Ongoing pain borderline blood pressure 5/2 Pain, leukocytosis fever, 5/3 Hypotension, not responding to crystalloid started on broad-spectrum antibiotics wound assessed by surgical team culture sent imaging pending ICU transfer. CT imaging of lumbar spine was negative for  definitive abscess there were stable fractures at L3 and L4 with possible new fracture involving the transverse process of L2 and L3 on the left postoperative changes noted.CT angio of chest was negative for pulmonary emboli had bibasilar airspace disease the right was a little worse than the left consistent with possible pneumonia there was aortic aneurysmal dilation of the ascending aorta measuring at 4.5 cm 5/4 Weaned off norepinephrine, Blood cultures from 1 out of 2 samples growing gram-negative rods with BC ID showing both proteus and Enterobacterales, MRSA PCR pending. Narrowed to ceftriaxone. 5/5 Remains on low dose NE. Febrile. BCx growing proteus.  5/6 Wound Cx with proteus, klebsiella aerogenes. Abx re-broadened to cefepime.  Interim History / Subjective:  No significant events overnight Feeling worse this morning, significant low back pain, not as bright Of note, we are on less than his home doses of meds Added adjunct pain meds (scheduled APAP, lido patch), already on gaba, increased standing oxycodone  XR Got up with OT yesterday but had a lot of pain Off of NE as of midnight, BPs marginal  Objective:   Blood pressure (!) 102/50, pulse 66, temperature 98.1 F (36.7 C), temperature source Oral, resp. rate 10, height 6' (1.829 m), weight 97.1 kg, SpO2 95%.        Intake/Output Summary (Last 24 hours) at 12/20/2023 0830 Last data filed at 12/20/2023 0200 Gross per 24 hour  Intake 729.57 ml  Output 450 ml  Net 279.57 ml   Filed Weights   12/08/23 0957 12/17/23 0545 12/18/23 0615  Weight: 101.6 kg 92.2 kg 97.1 kg   Physical Examination: General: Acutely ill-appearing elderly man in NAD. Appears uncomfortable today, not as talkative. HEENT: [place]/AT, anicteric sclera, PERRL, moist mucous membranes. Neuro:  Awake, oriented x 4. Responds to verbal stimuli. Following commands consistently. Moves all 4 extremities spontaneously. Chronic L foot drop. Strength equal in BUE. Prevena  incisional vac to lumbar surgical site.  CV: RRR, no m/g/r. PULM: Breathing even and unlabored on 3LNC. Lung fields diminished at bilateral bases, otherwise CTA. GI: Soft, nontender, nondistended. Hypoactive bowel sounds. Extremities: Trace bilateral UE edema, improved from prior. Trace BLE edema. Skin: Warm/dry, scattered ecchymosis to BUE R > L.  Resolved Hospital Problem List:     Assessment & Plan:  Septic shock with gram-negative rod bacteremia, Proteus: primary source unclear. Wound bed does not appear infected per neurosurgery. Urine looked OK. Possible PNA; responded nicely to fluid resuscitation and antibiotics with cleared LA. Imaging negative for abscess, no surgical intervention required. AM cortisol WNL. - Goal MAP > 65 - Gentle fluid resuscitation as tolerated, edema improved today 5/7 - Levo titrated to goal MAP, off as of 5/7 midnight - SDS reduced to taper - Trend WBC, fever curve - F/u Cx data - Continue broad-spectrum antibiotics (cefepime, broadened from CTX 5/6 for +klebsiella aerogenes in wound Cx) - ID following, appreciate recommendations  PAF w/ RVR (new); now back in NSR. No chest pain and asymptomatic  Echo 5/5 with EF 55-60%, normal LV/RV function, no RWMAs, trivial MR/AR, large pericardial fat pad. CHA2DS2-VASc Score 3 (age, HTN history, PVD). - Rate controlled at present - Optimize electrolyes (K > 4, Mg > 2) - Cardiac monitoring - Defer AC plan to NSGY in the immediate postoperative period  Acute respiratory failure, secondary to pneumonia and atelectasis New supplemental oxygen need 3L. - Supplemental O2 support for SpO2 > 90% - Pulmonary hygiene (IS/OOB/mobilize) - Continue cefepime as above  Status post redo of laminectomy L3-L4 with posterior fixation of L2-S1 4/25 Initial surgery date 11/13/2023. - POD#12 from revision - Postoperative management per NSGY - WV per NSGY - Multimodal pain management with oxycodone  (XR/IR), Dilaudid  PRN, Robaxin   PRN + adjunct pain medications (APAP, gaba, lidocaine  patches) - Frequent neuro checks - PT/OT  Acute metabolic encephalopathy secondary sepsis, improving - Source control as above - Correct metabolic derangements - Avoid additional sedating medications as able - Limit interruptions to sleep/wake cycle - Continue Provigil   Postoperative anemia.  Has slowly drifted from baseline of 12.5-7.5; got 1U PRBCs 5/3 - Trend H&H - Monitor for signs of active bleeding - Transfuse for Hgb < 7.0 or hemodynamically significant bleeding  HTN HLD PVD - Continue to hold home antihypertensives/diuretics (Norvasc , Diovan, hydrochlorothiazide ) - Continue Crestor   Hypothyroidism - Continue Synthroid   At risk for hyperglycemia in the setting of steroids - SSI - CBGs ACHS - Goal CBG 140-180  BPH - Continue finasteride , Flomax   GERD - PPI  Best Practice: (right click and "Reselect all SmartList Selections" daily)   Diet/type: Regular consistency (see orders) DVT prophylaxis LMWH Pressure ulcer(s): N/A GI prophylaxis: PPI Lines: N/A Foley:  N/A Code Status:  full code Last date of multidisciplinary goals of care discussion [Per Primary Team]  Critical care time:    The patient is critically ill with multiple organ system failure and requires high complexity decision making for assessment and support, frequent evaluation and titration of therapies, advanced monitoring, review of radiographic studies and interpretation of complex data.   Critical Care Time devoted to patient care services, exclusive of separately billable procedures, described in this note is 34 minutes.  Star East, PA-C [facility] 12/20/23 8:30 AM  Please see [email or web address] for pager details.  From 7A-7P if no response, please call [phone] After hours, please call ELink [phone]

## 2023-12-20 NOTE — Plan of Care (Signed)
  Problem: Clinical Measurements: Goal: Ability to maintain clinical measurements within normal limits will improve Outcome: Progressing Goal: Will remain free from infection Outcome: Progressing Goal: Diagnostic test results will improve Outcome: Progressing Goal: Respiratory complications will improve Outcome: Progressing Goal: Cardiovascular complication will be avoided Outcome: Progressing   Problem: Pain Managment: Goal: General experience of comfort will improve and/or be controlled Outcome: Progressing   Problem: Safety: Goal: Ability to remain free from injury will improve Outcome: Progressing   Problem: Activity: Goal: Risk for activity intolerance will decrease Outcome: Not Progressing   Problem: Nutrition: Goal: Adequate nutrition will be maintained Outcome: Not Progressing   Problem: Activity: Goal: Ability to tolerate increased activity will improve Outcome: Not Progressing

## 2023-12-20 NOTE — Progress Notes (Signed)
 eLink Physician-Brief Progress Note Patient Name: Norman Lambert DOB: [birth year] MRN: [id number]   Date of Service  12/20/2023  HPI/Events of Note  Patient requesting something for sleep.  eICU Interventions  Melatonin 3 mg po x 1 ordered.        Dylon Correa U Lonisha Bobby 12/20/2023, 2:00 AM

## 2023-12-20 NOTE — Progress Notes (Signed)
 Subjective: Patient reports doing ok, still having a lot of back pain. Had a difficult time getting up with therapy yesterday because of pain.   Objective: Vital signs in last 24 hours: Temp:  [97.7 F (36.5 C)-98.1 F (36.7 C)] 98.1 F (36.7 C) (05/07 0722) Pulse Rate:  [55-84] 66 (05/07 0700) Resp:  [7-21] 10 (05/07 0700) BP: (93-140)/(42-73) 102/50 (05/07 0700) SpO2:  [91 %-100 %] 95 % (05/07 0700)  Intake/Output from previous day: 05/06 0701 - 05/07 0700 In: 910.8 [P.O.:360; I.V.:250.7; IV Piggyback:300.1] Out: 1050 [Urine:1050] Intake/Output this shift: No intake/output data recorded.  Neurologic: Grossly normal  Lab Results: Lab Results  Component Value Date   WBC 8.9 12/20/2023   HGB 7.9 (L) 12/20/2023   HCT 23.6 (L) 12/20/2023   MCV 92.9 12/20/2023   PLT 164 12/20/2023   Lab Results  Component Value Date   INR 1.1 12/16/2023   BMET Lab Results  Component Value Date   NA 134 (L) 12/20/2023   K 3.6 12/20/2023   CL 100 12/20/2023   CO2 25 12/20/2023   GLUCOSE 104 (H) 12/20/2023   BUN 16 12/20/2023   CREATININE 0.56 (L) 12/20/2023   CALCIUM  7.9 (L) 12/20/2023    Studies/Results: ECHOCARDIOGRAM COMPLETE Result Date: 12/18/2023    ECHOCARDIOGRAM REPORT   Patient Name:   Norman Lambert Date of Exam: 12/18/2023 Medical Rec #:  [id number]      Height:       72.0 in Accession #:    [id number]     Weight:       214.1 lb Date of Birth:  [birth year]       BSA:          2.193 m Patient Age:    72 years       BP:           110/47 mmHg Patient Gender: M              HR:           93 bpm. Exam Location:  Inpatient Procedure: 2D Echo, Cardiac Doppler and Color Doppler (Both Spectral and Color            Flow Doppler were utilized during procedure). Indications:    Abnormal ECG R94.31  History:        Patient has no prior history of Echocardiogram examinations.                 Arrythmias:RBBB; Risk Factors:Hypertension and Dyslipidemia.  Sonographer:    Terrilee Few RCS  Referring Phys: [phone] PETER E BABCOCK IMPRESSIONS  1. Left ventricular ejection fraction, by estimation, is 55 to 60%. The left ventricle has normal function. The left ventricle has no regional wall motion abnormalities. There is moderate eccentric left ventricular hypertrophy of the inferior segment. Left ventricular diastolic parameters were normal.  2. Right ventricular systolic function is normal. The right ventricular size is normal. There is normal pulmonary artery systolic pressure.  3. Large epicardial fat pad (2.1 cm anterior to RV).  4. The mitral valve is grossly normal. Trivial mitral valve regurgitation. No evidence of mitral stenosis.  5. The aortic valve was not well visualized. There is moderate calcification of the aortic valve. There is mild thickening of the aortic valve. Aortic valve regurgitation is trivial. Mild aortic valve stenosis. Aortic valve mean gradient measures 14.0 mmHg.  6. Aortic dilatation noted. There is mild dilatation of the ascending aorta, measuring 43 mm.  7. The inferior vena cava  is dilated in size with >50% respiratory variability, suggesting right atrial pressure of 8 mmHg. Conclusion(s)/Recommendation(s): Otherwise normal echocardiogram, with minor abnormalities described in the report. FINDINGS  Left Ventricle: Left ventricular ejection fraction, by estimation, is 55 to 60%. The left ventricle has normal function. The left ventricle has no regional wall motion abnormalities. The left ventricular internal cavity size was normal in size. There is  moderate eccentric left ventricular hypertrophy of the inferior segment. Left ventricular diastolic parameters were normal. Right Ventricle: The right ventricular size is normal. Right vetricular wall thickness was not well visualized. Right ventricular systolic function is normal. There is normal pulmonary artery systolic pressure. The tricuspid regurgitant velocity is 1.34 m/s, and with an assumed right atrial pressure of 8  mmHg, the estimated right ventricular systolic pressure is 15.2 mmHg. Left Atrium: Left atrial size was normal in size. Right Atrium: Right atrial size was normal in size. Pericardium: Large epicardial fat pad (2.1 cm anterior to RV). Trivial pericardial effusion is present. Presence of epicardial fat layer. Mitral Valve: The mitral valve is grossly normal. Trivial mitral valve regurgitation. No evidence of mitral valve stenosis. Tricuspid Valve: The tricuspid valve is grossly normal. Tricuspid valve regurgitation is trivial. No evidence of tricuspid stenosis. Aortic Valve: The aortic valve was not well visualized. There is moderate calcification of the aortic valve. There is mild thickening of the aortic valve. Aortic valve regurgitation is trivial. Aortic regurgitation PHT measures 252 msec. Mild aortic stenosis is present. Aortic valve mean gradient measures 14.0 mmHg. Aortic valve peak gradient measures 25.4 mmHg. Aortic valve area, by VTI measures 1.51 cm. Pulmonic Valve: The pulmonic valve was not well visualized. Pulmonic valve regurgitation is not visualized. No evidence of pulmonic stenosis. Aorta: Aortic dilatation noted. There is mild dilatation of the ascending aorta, measuring 43 mm. Venous: The inferior vena cava is dilated in size with greater than 50% respiratory variability, suggesting right atrial pressure of 8 mmHg. IAS/Shunts: The atrial septum is grossly normal.  LEFT VENTRICLE PLAX 2D LVIDd:         5.60 cm   Diastology LVIDs:         3.90 cm   LV e' medial:    10.00 cm/s LV PW:         1.70 cm   LV E/e' medial:  7.9 LV IVS:        1.00 cm   LV e' lateral:   8.16 cm/s LVOT diam:     2.10 cm   LV E/e' lateral: 9.7 LV SV:         66 LV SV Index:   30 LVOT Area:     3.46 cm  RIGHT VENTRICLE             IVC RV S prime:     11.50 cm/s  IVC diam: 2.30 cm TAPSE (M-mode): 1.9 cm LEFT ATRIUM           Index        RIGHT ATRIUM           Index LA diam:      3.40 cm 1.55 cm/m   RA Area:     18.65 cm  LA Vol (A2C): 44.2 ml 20.16 ml/m  RA Volume:   43.55 ml  19.86 ml/m LA Vol (A4C): 36.5 ml 16.64 ml/m  AORTIC VALVE AV Area (Vmax):    1.51 cm AV Area (Vmean):   1.45 cm AV Area (VTI):     1.51 cm AV Vmax:  252.00 cm/s AV Vmean:          172.500 cm/s AV VTI:            0.438 m AV Peak Grad:      25.4 mmHg AV Mean Grad:      14.0 mmHg LVOT Vmax:         109.60 cm/s LVOT Vmean:        72.020 cm/s LVOT VTI:          0.190 m LVOT/AV VTI ratio: 0.43 AI PHT:            252 msec  AORTA Ao Asc diam: 4.30 cm MITRAL VALVE               TRICUSPID VALVE MV Area (PHT): 3.65 cm    TR Peak grad:   7.2 mmHg MV Decel Time: 208 msec    TR Vmax:        134.00 cm/s MV E velocity: 79.20 cm/s MV A velocity: 90.30 cm/s  SHUNTS MV E/A ratio:  0.88        Systemic VTI:  0.19 m                            Systemic Diam: 2.10 cm [name] MD Electronically signed by [name] MD Signature Date/Time: 12/18/2023/2:25:06 PM    Final     Assessment/Plan: Still having trouble with pain control. We did change his wound vac dressing, has quite a bit of drainage.    LOS: 15 days    Norman Lambert 12/20/2023, 8:23 AM

## 2023-12-20 NOTE — Progress Notes (Signed)
 PT Cancellation Note  Patient Details Name: Norman Lambert MRN: [id number] DOB: [birth year]   Cancelled Treatment:    Reason Eval/Treat Not Completed: Other (comment) (Pt refused, stating " The doctor said there was an opening that they need to address prior to moving around."  No doctor online to message. Nurse aware. Will return as able.)   [name] 12/20/2023, 11:36 AM [name] M,PT Acute Rehab Services [phone]

## 2023-12-20 NOTE — Progress Notes (Signed)
 Pharmacy Electrolyte Replacement  Recent Labs:  Recent Labs    12/19/23 0241 12/20/23 0345  K 4.0 3.6  MG 2.1  --   PHOS 4.1  --   CREATININE 0.51* 0.56*    Low Critical Values (K </= 2.5, Phos </= 1, Mg </= 1) Present: None  MD Contacted: n/a  Plan: 40 mEq KCL x1 PO   Patience Bonito, PharmD, BCPS, BCCCP Clinical Pharmacist

## 2023-12-21 DIAGNOSIS — A419 Sepsis, unspecified organism: Secondary | ICD-10-CM | POA: Diagnosis not present

## 2023-12-21 DIAGNOSIS — R6521 Severe sepsis with septic shock: Secondary | ICD-10-CM | POA: Diagnosis not present

## 2023-12-21 DIAGNOSIS — I480 Paroxysmal atrial fibrillation: Secondary | ICD-10-CM | POA: Diagnosis not present

## 2023-12-21 DIAGNOSIS — B964 Proteus (mirabilis) (morganii) as the cause of diseases classified elsewhere: Secondary | ICD-10-CM | POA: Diagnosis not present

## 2023-12-21 LAB — BASIC METABOLIC PANEL WITH GFR
Anion gap: 6 (ref 5–15)
BUN: 16 mg/dL (ref 8–23)
CO2: 24 mmol/L (ref 22–32)
Calcium: 7.9 mg/dL — ABNORMAL LOW (ref 8.9–10.3)
Chloride: 106 mmol/L (ref 98–111)
Creatinine, Ser: 0.57 mg/dL — ABNORMAL LOW (ref 0.61–1.24)
GFR, Estimated: >60 mL/min (ref >60)
Glucose, Bld: 90 mg/dL (ref 70–99)
Potassium: 3.9 mmol/L (ref 3.5–5.1)
Sodium: 136 mmol/L (ref 135–145)

## 2023-12-21 LAB — AEROBIC/ANAEROBIC CULTURE W GRAM STAIN (SURGICAL/DEEP WOUND)

## 2023-12-21 LAB — CBC
HCT: 24.4 % — ABNORMAL LOW (ref 39.0–52.0)
Hemoglobin: 8.1 g/dL — ABNORMAL LOW (ref 13.0–17.0)
MCH: 31.3 pg (ref 26.0–34.0)
MCHC: 33.2 g/dL (ref 30.0–36.0)
MCV: 94.2 fL (ref 80.0–100.0)
Platelets: 162 10*3/uL (ref 150–400)
RBC: 2.59 MIL/uL — ABNORMAL LOW (ref 4.22–5.81)
RDW: 14.1 % (ref 11.5–15.5)
WBC: 7.2 10*3/uL (ref 4.0–10.5)
nRBC: 0 % (ref 0.0–0.2)

## 2023-12-21 LAB — GLUCOSE, CAPILLARY
Glucose-Capillary: 83 mg/dL (ref 70–99)
Glucose-Capillary: 119 mg/dL — ABNORMAL HIGH (ref 70–99)
Glucose-Capillary: 132 mg/dL — ABNORMAL HIGH (ref 70–99)
Glucose-Capillary: 136 mg/dL — ABNORMAL HIGH (ref 70–99)
Glucose-Capillary: 109 mg/dL — ABNORMAL HIGH (ref 70–99)

## 2023-12-21 LAB — CULTURE, BLOOD (ROUTINE X 2)
Culture: NO GROWTH
Special Requests: ADEQUATE

## 2023-12-21 LAB — MAGNESIUM: Magnesium: 1.8 mg/dL (ref 1.7–2.4)

## 2023-12-21 LAB — PROTIME-INR
INR: 1.1 (ref 0.8–1.2)
Prothrombin Time: 14.4 s (ref 11.4–15.2)

## 2023-12-21 LAB — PHOSPHORUS: Phosphorus: 3 mg/dL (ref 2.5–4.6)

## 2023-12-21 MED ORDER — MIDODRINE HCL 5 MG PO TABS
10.0000 mg | ORAL_TABLET | Freq: Three times a day (TID) | ORAL | Status: DC
  Administered 2023-12-21 – 2024-01-05 (×43): 10 mg via ORAL
  Filled 2023-12-21 (×43): qty 2

## 2023-12-21 MED ORDER — MAGNESIUM SULFATE 2 GM/50ML IV SOLN
2.0000 g | Freq: Once | INTRAVENOUS | Status: AC
  Administered 2023-12-21: 2 g via INTRAVENOUS
  Filled 2023-12-21: qty 50

## 2023-12-21 NOTE — Progress Notes (Signed)
 Pharmacy Electrolyte Replacement  Recent Labs:  Recent Labs    12/21/23 0417  K 3.9  MG 1.8  PHOS 3.0  CREATININE 0.57*    Low Critical Values (K </= 2.5, Phos </= 1, Mg </= 1) Present: None  MD Contacted: n/a  Plan: Magnesium  Sulfate 2 g IV x1  Patience Bonito, PharmD, BCPS, BCCCP Clinical Pharmacist

## 2023-12-21 NOTE — Plan of Care (Signed)

## 2023-12-21 NOTE — Progress Notes (Signed)
 Patient ID: Norman Lambert, male   DOB: Jan 21, 1951, 73 y.o.   MRN: [id number] He is not complaining of much pain.  He is awake and alert.  I removed his external wound VAC again today and there are areas of dehiscence with serosanguineous drainage.  I redressed it with myriad sheet over the areas of dehiscence followed by Sorbact and replace the external wound VAC and will hook it to the hospital [facility] system instead of the Prevena.  Will get a give this until Monday and recheck the wound.  It if is not healing well or at least getting better I will take him back for irrigation and debridement and primary closure.  Continue cefepime Mobilize as tolerated Continue Lovenox  for DVT prophylaxis I am concerned he is becoming very deconditioned as he was very immobile for about 6 weeks before the original surgery where he was basically bedbound, immobile for 3 weeks after the original surgery, and now immobile here in the hospital after his revision surgery.  I do appreciate CCM's excellent medical care here, with his antibiotics and his stress dosing and generalized medical care.

## 2023-12-21 NOTE — Progress Notes (Signed)
 Physical Therapy Treatment Patient Details Name: GAINS DUCKSWORTH MRN: [id number] DOB: Sep 29, 1950 Today's Date: 12/21/2023   History of Present Illness 73 yo male admitted 12/05/23 with LB and severe back pain. 12/08/23 redo of laminectomy L3-4 with posterior fixation L2-S1; recovery complicated by pain and hypotension; 5/2 hypotension and tachycardia. 5/3 transfers to ICU, pressors, L2-3 TVP fx. 5/5 VAC placed. PMHx: gout, arthritis, HTN, HLD, HTN, PVD, RBBB, Rt reverse TSA, sleep apnea, 11/13/23 PLIF L3-4    PT Comments  Caley stating continued pain and lack of desire for mobility due to pain. Medicated and pt willing to attempt with encouragement. Pt able to stand with decreased assist and side step toward [facility] with +2 assist. Unable to take steps for gait and denied OOB to chair. Encouraged increased movement and need for activity progression. Will continue to follow.   HR 73 SPO2 98% on RA BP 120/48    If plan is discharge home, recommend the following: A lot of help with bathing/dressing/bathroom;Two people to help with walking and/or transfers;Assistance with cooking/housework;Assist for transportation   Can travel by private vehicle     No  Equipment Recommendations  Rolling walker (2 wheels);BSC/3in1;Hospital bed    Recommendations for Other Services       Precautions / Restrictions Precautions Precautions: Back;Fall;Other (comment) Recall of Precautions/Restrictions: Intact Precaution/Restrictions Comments: recalls 3/3 back precautions, assist for brace Spinal Brace: Lumbar corset;Applied in sitting position Other Brace: AFO for LLE (not donned this date)     Mobility  Bed Mobility Overal bed mobility: Needs Assistance Bed Mobility: Rolling, Sidelying to Sit, Sit to Sidelying Rolling: Mod assist Sidelying to sit: Mod assist, HOB elevated, Used rails   Sit to supine: Max assist   General bed mobility comments: mod assist to roll, clear legs from surface and elevate trunk.  max assist to return to sidelying and supine    Transfers Overall transfer level: Needs assistance   Transfers: Sit to/from Stand Sit to Stand: Min assist, From elevated surface, +2 physical assistance           General transfer comment: min +2 assist to stand from EOB x 2 trials, cues for hand placement and safety. Pt able to side step toward [facility] with max assist to advance LLE, increased time    Ambulation/Gait               General Gait Details: unable to attempt this date   Stairs             Wheelchair Mobility     Tilt Bed    Modified Rankin (Stroke Patients Only)       Balance Overall balance assessment: Needs assistance Sitting-balance support: Feet supported, Bilateral upper extremity supported Sitting balance-Leahy Scale: Fair     Standing balance support: Bilateral upper extremity supported, During functional activity, Reliant on assistive device for balance Standing balance-Leahy Scale: Poor Standing balance comment: reliant on RW                            Communication Communication Communication: No apparent difficulties  Cognition Arousal: Lethargic Behavior During Therapy: Flat affect   PT - Cognitive impairments: Orientation, Memory, Problem solving, Safety/Judgement   Orientation impairments: Time, Situation, Place                   PT - Cognition Comments: pt stating he is getting moved to ICU despite currently being in ICU. able to state precautions and  required mod multimodal cues to follow commands Following commands: Impaired Following commands impaired: Follows one step commands with increased time    Cueing Cueing Techniques: Verbal cues, Gestural cues, Tactile cues  Exercises General Exercises - Lower Extremity Short Arc Quad: AROM, Both, 10 reps, Seated    General Comments        Pertinent Vitals/Pain Pain Assessment Pain Assessment: 0-10 Pain Score: 9  Pain Location: back Pain  Descriptors / Indicators: Aching, Guarding Pain Intervention(s): Limited activity within patient's tolerance, Monitored during session, RN gave pain meds during session, Repositioned    Home Living                          Prior Function            PT Goals (current goals can now be found in the care plan section) Progress towards PT goals: Progressing toward goals    Frequency    Min 2X/week      PT Plan      Co-evaluation              AM-PAC PT "6 Clicks" Mobility   Outcome Measure  Help needed turning from your back to your side while in a flat bed without using bedrails?: A Lot Help needed moving from lying on your back to sitting on the side of a flat bed without using bedrails?: A Lot Help needed moving to and from a bed to a chair (including a wheelchair)?: Total Help needed standing up from a chair using your arms (e.g., wheelchair or bedside chair)?: Total Help needed to walk in hospital room?: Total Help needed climbing 3-5 steps with a railing? : Total 6 Click Score: 8    End of Session Equipment Utilized During Treatment: Back brace;Gait belt Activity Tolerance: Patient tolerated treatment well Patient left: in bed;with call bell/phone within reach;with family/visitor present;with bed alarm set Nurse Communication: Mobility status PT Visit Diagnosis: Unsteadiness on feet (R26.81);History of falling (Z91.81);Muscle weakness (generalized) (M62.81);Pain     Time: 4782-9562 PT Time Calculation (min) (ACUTE ONLY): 24 min  Charges:    $Therapeutic Activity: 23-37 mins PT General Charges $$ ACUTE PT VISIT: 1 Visit                     Annis Baseman, PT Acute Rehabilitation Services Office: [phone]    Luwana Salvo 12/21/2023, 9:33 AM

## 2023-12-21 NOTE — Progress Notes (Signed)
 [facility]  Date of Admission:  12/05/2023   Total days of inpatient antibiotics 4  Principal Problem:   Back pain Active Problems:   S/P lumbar fusion   Septic shock [facility])          Assessment: 73 year old male admitted with: #Hospital onset bacteremia with Proteus mirabilis likely secondary to postop wound - Patient had undergone lumbar decompression and fusion on 3/31 with Dr. Rochelle Chu is doing well until 4 days prior to admission started having increased pain.  MRI in office unremarkable.  Admitted for further workup as pain continued and there was kyphosis on plain film - Patient underwent redo decompression of L2-L4, exploration fusion L3-L4 as well.   [facility] course complicated by increased pain subsequent fever and found Blood cultures on 5/3 grew Proteus mirabilis - CT lumbar spine on 5/3 showed postsurgical changes of the lumbar spine, no definite evidence of abscess "limited due to hardware artifact. . - Neurosurgery following noted superficial wound infection, no evidence of deeper collection with palpation.  Wound culture fgroing proteus and klebsiella aerogenes Recommendations:  -Continue cefepime. Will not target corynebacterium sp as cx were bedside and it is a known commensal skin organism, -follow OR Cx -standard precautions   Evaluation of this patient requires complex antimicrobial therapy evaluation and counseling + isolation needs for disease transmission risk assessment and mitigation     Microbiology:   Antibiotics:   Vancomycin  5/30 Ctx 5/2- Cultures: Blood 5/3/ 1/2 set proteus mirabliis   SUBJECTIVE: Resting in bed. Wife at bedside  Interval: Afebrile overnight. Wbc 8.9k  Review of Systems: Review of Systems  All other systems reviewed and are negative.    Scheduled Meds:  acetaminophen   650 mg Oral Q8H   vitamin C   1,000 mg Oral Daily   Chlorhexidine  Gluconate Cloth  6 each Topical Daily    cyanocobalamin   1,000 mcg Oral Daily   enoxaparin  (LOVENOX ) injection  50 mg Subcutaneous Q24H   feeding supplement  237 mL Oral BID BM   finasteride   5 mg Oral Daily   folic acid   1 mg Oral Daily   gabapentin   300 mg Oral TID   insulin aspart  0-5 Units Subcutaneous QHS   insulin aspart  0-9 Units Subcutaneous TID WC   levothyroxine   112 mcg Oral Q0600   lidocaine   2 patch Transdermal Q24H   loratadine   10 mg Oral Daily   melatonin  3 mg Oral QHS   midodrine  5 mg Oral TID WC   modafinil   200 mg Oral Daily   oxyCODONE   20 mg Oral Q12H   pantoprazole   40 mg Oral Daily   predniSONE   15 mg Oral Q breakfast   Followed by   [name] ON 12/27/2023] predniSONE   10 mg Oral Q breakfast   Followed by   [name] ON 01/03/2024] predniSONE   5 mg Oral Q breakfast   Followed by   [name] ON 01/10/2024] predniSONE   2.5 mg Oral Q breakfast   rosuvastatin   5 mg Oral Daily   senna  1 tablet Oral BID   sodium chloride  flush  3 mL Intravenous Q12H   zinc  sulfate (50mg  elemental zinc )  220 mg Oral Daily   Continuous Infusions:  ceFEPime (MAXIPIME) IV 2 g (12/21/23 0426)   norepinephrine (LEVOPHED) Adult infusion 1 mcg/min (12/21/23 0400)   PRN Meds:.bisacodyl , HYDROmorphone  (DILAUDID ) injection, ipratropium-albuterol , menthol -cetylpyridinium **OR** phenol, methocarbamol  **OR** methocarbamol  (ROBAXIN ) injection, ondansetron  **OR** ondansetron  (ZOFRAN ) IV,  oxyCODONE , sodium chloride  flush Allergies  Allergen Reactions   Benzoic Acid Hives and Rash    Positive Patch Test 11/12/20   Imidurea Rash    Positive Patch Test 11/12/20   Urea  Rash    Positive Patch Test 11/12/20   Other Rash and Other (See Comments)    " DARK DYES " [ UNSPECIFIED DYE ]    OBJECTIVE: Vitals:   12/21/23 0345 12/21/23 0400 12/21/23 0415 12/21/23 0430  BP: (!) 113/52 (!) 121/48 (!) 129/41 (!) 130/44  Pulse: (!) 57 62 (!) 59 (!) 57  Resp: (!) 8 11 11 14   Temp:      TempSrc:      SpO2: 94% 97% 97% 96%  Weight:      Height:        Body mass index is 27.69 kg/m.  Physical Exam Constitutional:      General: He is not in acute distress.    Appearance: He is normal weight. He is not toxic-appearing.  HENT:     Head: Normocephalic and atraumatic.     Right Ear: External ear normal.     Left Ear: External ear normal.     Nose: No congestion or rhinorrhea.     Mouth/Throat:     Mouth: Mucous membranes are moist.     Pharynx: Oropharynx is clear.  Eyes:     Extraocular Movements: Extraocular movements intact.     Conjunctiva/sclera: Conjunctivae normal.     Pupils: Pupils are equal, round, and reactive to light.  Cardiovascular:     Rate and Rhythm: Normal rate and regular rhythm.     Heart sounds: No murmur heard.    No friction rub. No gallop.  Pulmonary:     Effort: Pulmonary effort is normal.     Breath sounds: Normal breath sounds.  Abdominal:     General: Abdomen is flat. Bowel sounds are normal.     Palpations: Abdomen is soft.  Musculoskeletal:        General: No swelling.     Cervical back: Normal range of motion and neck supple.  Skin:    General: Skin is warm and dry.  Neurological:     General: No focal deficit present.     Mental Status: He is oriented to person, place, and time.  Psychiatric:        Mood and Affect: Mood normal.       Lab Results Lab Results  Component Value Date   WBC 8.9 12/20/2023   HGB 7.9 (L) 12/20/2023   HCT 23.6 (L) 12/20/2023   MCV 92.9 12/20/2023   PLT 164 12/20/2023    Lab Results  Component Value Date   CREATININE 0.56 (L) 12/20/2023   BUN 16 12/20/2023   NA 134 (L) 12/20/2023   K 3.6 12/20/2023   CL 100 12/20/2023   CO2 25 12/20/2023    Lab Results  Component Value Date   ALT 41 12/16/2023   AST 49 (H) 12/16/2023   ALKPHOS 89 12/16/2023   BILITOT 0.6 12/16/2023        Orlie Bjornstad, MD [facility] 12/21/2023, 5:06 AM

## 2023-12-21 NOTE — Progress Notes (Signed)
 NAME:  Norman Lambert, MRN:  [id number], DOB:  Apr 24, 1951, LOS: 16 ADMISSION DATE:  12/05/2023, CONSULTATION DATE:  12/16/2023 REFERRING MD: Elgergawy - [facility], CHIEF COMPLAINT: Septic shock    History of Present Illness:  73 year old male patient who was admitted on 4/25 for reduction and internal fixation of previous fusions involving L2-S1. Postoperative course initially seemed unremarkable pain had improved still not able to walk as of 4/29 on 4/30 began to have new increased pain involving the left foot, on 5/1 early in the morning continued to report 10 out of 10 pain seemingly not responding to as needed narcotics.  Imaging of the back was unremarkable, he continued to have drain VAC in place in the evening hours of 5/2 began to have borderline hypotension with tachycardia 70 have shakes and tremors in the evening, temperature spike to 103 was started on IV cefepime and vancomycin  with concern about sepsis white blood cell count elevated to 17,000 there was significant wound erythema with serosanguineous foul-smelling drainage from the surgical site.  Initially internal medicine was consulted but after administering about a liter of crystalloid the patient remained hypotensive in the 70s so critical care was asked to evaluate for possible transfer to the intensive care  Pertinent Medical History:  Hypertension, GERD, gout, hyperlipidemia,-hypothyroidism peripheral vascular disease, sleep apnea, right bundle branch block prior lumbar back surgery  Significant Hospital Events: Including procedures, antibiotic start and stop dates in addition to other pertinent events   4/25 Extensive L2-S1 lumbar surgery 4/30 New onset pain  5/1 Ongoing pain borderline blood pressure 5/2 Pain, leukocytosis fever, 5/3 Hypotension, not responding to crystalloid started on broad-spectrum antibiotics wound assessed by surgical team culture sent imaging pending ICU transfer. CT imaging of lumbar spine was negative for  definitive abscess there were stable fractures at L3 and L4 with possible new fracture involving the transverse process of L2 and L3 on the left postoperative changes noted.CT angio of chest was negative for pulmonary emboli had bibasilar airspace disease the right was a little worse than the left consistent with possible pneumonia there was aortic aneurysmal dilation of the ascending aorta measuring at 4.5 cm 5/4 Weaned off norepinephrine, Blood cultures from 1 out of 2 samples growing gram-negative rods with BC ID showing both proteus and Enterobacterales, MRSA PCR pending. Narrowed to ceftriaxone. 5/5 Remains on low dose NE. Febrile. BCx growing proteus.  5/6 Wound Cx with proteus, klebsiella aerogenes. Abx re-broadened to cefepime.  Interim History / Subjective:  Required low dose norepi overnight but off this AM His wound vac was changed this AM and has been having some pain He has not been able to work with PT much due to discomfort   Objective:   Blood pressure (!) 130/44, pulse (!) 57, temperature 97.6 F (36.4 C), temperature source Axillary, resp. rate 14, height 6' (1.829 m), weight 92.6 kg, SpO2 96%.        Intake/Output Summary (Last 24 hours) at 12/21/2023 0736 Last data filed at 12/21/2023 0500 Gross per 24 hour  Intake 650.3 ml  Output 520 ml  Net 130.3 ml   Filed Weights   12/17/23 0545 12/18/23 0615 12/20/23 2106  Weight: 92.2 kg 97.1 kg 92.6 kg   Physical Examination: General: elderly, ill-appearing M resting in bed appearing mildly uncomfortable  HEENT: [place]/AT, anicteric sclera, PERRL, moist mucous membranes. Neuro: Awake, oriented x 4. Responds to verbal stimuli. Following commands consistently. Moves all 4 extremities spontaneously. Chronic L foot drop. Strength equal in BUE. Prevena incisional  vac to lumbar surgical site.  CV: RRR, no m/g/r. PULM: clear bilaterally on RA, no tachypnea or accessory muscle use  GI: Soft, nontender, nondistended. Hypoactive bowel  sounds. Extremities: Trace bilateral UE edema, improved from prior. Trace BLE edema.   Resolved Hospital Problem List:     Assessment & Plan:    Septic shock from Proteus bacteremia Surgical wound culture also growing proteus  -shock physiology improving -ID following continue Cefepime -Neurosurgery noted areas of dehiscence over surgical site today, wound VAC replaced and attached external VAC sytem instead of the Prevena and plan to re-evaluate the wound on 5/12 with possible return to the OR for debridement and primary closure if needed -increase Midodrine to 10mg  tid, if off pressors for the next 24hrs can likely transition out of the ICU tomorrow - Goal MAP > 65 - on Prednisone  taper  - Continue broad-spectrum antibiotics (cefepime, broadened from CTX 5/6 for +klebsiella aerogenes in wound Cx) - ID following, appreciate recommendations  PAF w/ RVR (new);  now back in NSR. No chest pain and asymptomatic  Echo 5/5 with EF 55-60%, normal LV/RV function, no RWMAs, trivial MR/AR, large pericardial fat pad. CHA2DS2-VASc Score 3 (age, HTN history, PVD). - Rate controlled at present - Optimize electrolyes (K > 4, Mg > 2) - Cardiac monitoring - Defer AC plan to NSGY in the immediate postoperative period  Acute respiratory failure, secondary to pneumonia and atelectasis Improving, now on RA  - Supplemental O2 support for SpO2 > 90% - Pulmonary hygiene (IS/OOB/mobilize) - Continue cefepime as above  Status post redo of laminectomy L3-L4 with posterior fixation of L2-S1 4/25 Initial surgery date 11/13/2023. - POD#12 from revision - Postoperative management per NSGY - WV per NSGY - continue multimodal pain management with oxycodone  (XR/IR), Dilaudid  PRN, Robaxin  PRN + adjunct pain medications (APAP, gaba, lidocaine  patches) - Frequent neuro checks - PT/OT  Acute metabolic encephalopathy secondary sepsis, improving - Source control as above - Correct metabolic derangements -  Avoid additional sedating medications as able - Limit interruptions to sleep/wake cycle - Continue Provigil   Postoperative anemia.   Stable - Trend H&H - Monitor for signs of active bleeding - Transfuse for Hgb < 7.0 or hemodynamically significant bleeding  HTN HLD PVD - Continue to hold home antihypertensives/diuretics (Norvasc , Diovan, hydrochlorothiazide ) - Continue Crestor   Hypothyroidism - Continue Synthroid   At risk for hyperglycemia in the setting of steroids - SSI - CBGs ACHS - Goal CBG 140-180  BPH - Continue finasteride , Flomax   GERD - PPI  Best Practice: (right click and "Reselect all SmartList Selections" daily)   Diet/type: Regular consistency (see orders) DVT prophylaxis LMWH Pressure ulcer(s): N/A GI prophylaxis: PPI Lines: N/A Foley:  N/A Code Status:  full code Last date of multidisciplinary goals of care discussion [Per Primary Team]  Critical care time:    The patient is critically ill with multiple organ system failure and requires high complexity decision making for assessment and support, frequent evaluation and titration of therapies, advanced monitoring, review of radiographic studies and interpretation of complex data.   Critical Care Time devoted to patient care services, exclusive of separately billable procedures, described in this note is 34 minutes.  Patt Boozer Davielle Lingelbach, PA-C [facility] 12/21/23 7:36 AM  Please see [email or web address] for pager details.  From 7A-7P if no response, please call [phone] After hours, please call ELink [phone]

## 2023-12-21 NOTE — Plan of Care (Signed)
  Problem: Education: Goal: Knowledge of General Education information will improve Description: Including pain rating scale, medication(s)/side effects and non-pharmacologic comfort measures Outcome: Progressing   Problem: Health Behavior/Discharge Planning: Goal: Ability to manage health-related needs will improve Outcome: Progressing   Problem: Clinical Measurements: Goal: Ability to maintain clinical measurements within normal limits will improve Outcome: Progressing Goal: Will remain free from infection Outcome: Progressing Goal: Diagnostic test results will improve Outcome: Progressing Goal: Respiratory complications will improve Outcome: Progressing Goal: Cardiovascular complication will be avoided Outcome: Progressing   Problem: Activity: Goal: Risk for activity intolerance will decrease Outcome: Progressing   Problem: Nutrition: Goal: Adequate nutrition will be maintained Outcome: Progressing   Problem: Coping: Goal: Level of anxiety will decrease Outcome: Progressing   Problem: Elimination: Goal: Will not experience complications related to bowel motility Outcome: Progressing Goal: Will not experience complications related to urinary retention Outcome: Progressing   Problem: Pain Managment: Goal: General experience of comfort will improve and/or be controlled Outcome: Progressing   Problem: Safety: Goal: Ability to remain free from injury will improve Outcome: Progressing   Problem: Education: Goal: Ability to verbalize activity precautions or restrictions will improve Outcome: Progressing Goal: Knowledge of the prescribed therapeutic regimen will improve Outcome: Progressing Goal: Understanding of discharge needs will improve Outcome: Progressing   Problem: Activity: Goal: Ability to avoid complications of mobility impairment will improve Outcome: Progressing Goal: Ability to tolerate increased activity will improve Outcome: Progressing Goal:  Will remain free from falls Outcome: Progressing   Problem: Bowel/Gastric: Goal: Gastrointestinal status for postoperative course will improve Outcome: Progressing   Problem: Clinical Measurements: Goal: Ability to maintain clinical measurements within normal limits will improve Outcome: Progressing Goal: Postoperative complications will be avoided or minimized Outcome: Progressing Goal: Diagnostic test results will improve Outcome: Progressing   Problem: Pain Management: Goal: Pain level will decrease Outcome: Progressing   Problem: Skin Integrity: Goal: Will show signs of wound healing Outcome: Progressing   Problem: Health Behavior/Discharge Planning: Goal: Identification of resources available to assist in meeting health care needs will improve Outcome: Progressing   Problem: Bladder/Genitourinary: Goal: Urinary functional status for postoperative course will improve Outcome: Progressing   Problem: Skin Integrity: Goal: Risk for impaired skin integrity will decrease Outcome: Not Progressing

## 2023-12-22 DIAGNOSIS — L8990 Pressure ulcer of unspecified site, unspecified stage: Secondary | ICD-10-CM | POA: Insufficient documentation

## 2023-12-22 DIAGNOSIS — R6521 Severe sepsis with septic shock: Secondary | ICD-10-CM | POA: Diagnosis not present

## 2023-12-22 DIAGNOSIS — A419 Sepsis, unspecified organism: Secondary | ICD-10-CM | POA: Diagnosis not present

## 2023-12-22 DIAGNOSIS — B964 Proteus (mirabilis) (morganii) as the cause of diseases classified elsewhere: Secondary | ICD-10-CM | POA: Diagnosis not present

## 2023-12-22 DIAGNOSIS — J9600 Acute respiratory failure, unspecified whether with hypoxia or hypercapnia: Secondary | ICD-10-CM

## 2023-12-22 DIAGNOSIS — R7881 Bacteremia: Secondary | ICD-10-CM

## 2023-12-22 LAB — BASIC METABOLIC PANEL WITH GFR
Anion gap: 8 (ref 5–15)
BUN: 13 mg/dL (ref 8–23)
CO2: 25 mmol/L (ref 22–32)
Calcium: 8.1 mg/dL — ABNORMAL LOW (ref 8.9–10.3)
Chloride: 104 mmol/L (ref 98–111)
Creatinine, Ser: 0.6 mg/dL — ABNORMAL LOW (ref 0.61–1.24)
GFR, Estimated: >60 mL/min (ref >60)
Glucose, Bld: 77 mg/dL (ref 70–99)
Potassium: 3.9 mmol/L (ref 3.5–5.1)
Sodium: 137 mmol/L (ref 135–145)

## 2023-12-22 LAB — CBC
HCT: 24.6 % — ABNORMAL LOW (ref 39.0–52.0)
Hemoglobin: 8 g/dL — ABNORMAL LOW (ref 13.0–17.0)
MCH: 30.5 pg (ref 26.0–34.0)
MCHC: 32.5 g/dL (ref 30.0–36.0)
MCV: 93.9 fL (ref 80.0–100.0)
Platelets: 211 10*3/uL (ref 150–400)
RBC: 2.62 MIL/uL — ABNORMAL LOW (ref 4.22–5.81)
RDW: 14.1 % (ref 11.5–15.5)
WBC: 8.5 10*3/uL (ref 4.0–10.5)
nRBC: 0 % (ref 0.0–0.2)

## 2023-12-22 LAB — GLUCOSE, CAPILLARY
Glucose-Capillary: 83 mg/dL (ref 70–99)
Glucose-Capillary: 105 mg/dL — ABNORMAL HIGH (ref 70–99)
Glucose-Capillary: 135 mg/dL — ABNORMAL HIGH (ref 70–99)
Glucose-Capillary: 103 mg/dL — ABNORMAL HIGH (ref 70–99)
Glucose-Capillary: 105 mg/dL — ABNORMAL HIGH (ref 70–99)

## 2023-12-22 LAB — MAGNESIUM: Magnesium: 2 mg/dL (ref 1.7–2.4)

## 2023-12-22 NOTE — Progress Notes (Signed)
 Admitted as a transfer to [facility]M ICU to 4E Room 4E 22.  Placed on hardwired cardiac monitor and vitals obtained.  Oriented to call bell usage and unit routine.  Wife at bedside.     12/22/23 1228 12/22/23 1230  Vitals  Temp 98.2 F (36.8 C)  --   Temp Source Oral  --   BP (!) 115/56  --   MAP (mmHg) 74  --   BP Location Right Arm  --   BP Method Automatic  --   Patient Position (if appropriate) Lying  --   Pulse Rate 74  --   Pulse Rate Source Monitor  --   ECG Heart Rate 73  --   Resp 13  --   Level of Consciousness  Level of Consciousness Alert  --   MEWS COLOR  MEWS Score Color Green  --   Oxygen Therapy  SpO2 93 %  --   O2 Device Room Air  --   ECG Monitoring  PR interval  --  0.17  QRS interval  --  0.15  QT interval  --  0.4  QTc interval  --  0.43  CV Strip Heart Rate  --  74  Cardiac Rhythm  --  NSR;BBB  Telemetry Box Number  --  [id number] Hardwired  Tele Box Verification Completed by Second Verifier  --  Completed

## 2023-12-22 NOTE — Progress Notes (Signed)
 Patient ID: Norman Lambert, male   DOB: 07-31-1951, 73 y.o.   MRN: [id number] Overall he is doing okay.  He has some back soreness.  He is fairly immobile.  He remains on antibiotics.  Wound VAC is in place.  We will continue the wound VAC over the weekend and then remove it on Monday and look at the wound to see if there has been any partial healing.  If not we will consider irrigation and debridement of the wound with primary closure.

## 2023-12-22 NOTE — Progress Notes (Addendum)
 NAME:  Norman Lambert, MRN:  [id number], DOB:  Jun 21, 1951, LOS: 17 ADMISSION DATE:  12/05/2023, CONSULTATION DATE:  12/16/2023 REFERRING MD: Elgergawy - [facility], CHIEF COMPLAINT: Septic shock    History of Present Illness:  73 year old male patient who was admitted on 4/25 for reduction and internal fixation of previous fusions involving L2-S1. Postoperative course initially seemed unremarkable pain had improved still not able to walk as of 4/29 on 4/30 began to have new increased pain involving the left foot, on 5/1 early in the morning continued to report 10 out of 10 pain seemingly not responding to as needed narcotics.  Imaging of the back was unremarkable, he continued to have drain VAC in place in the evening hours of 5/2 began to have borderline hypotension with tachycardia 70 have shakes and tremors in the evening, temperature spike to 103 was started on IV cefepime  and vancomycin  with concern about sepsis white blood cell count elevated to 17,000 there was significant wound erythema with serosanguineous foul-smelling drainage from the surgical site.  Initially internal medicine was consulted but after administering about a liter of crystalloid the patient remained hypotensive in the 70s so critical care was asked to evaluate for possible transfer to the intensive care  Pertinent Medical History:  Hypertension, GERD, gout, hyperlipidemia,-hypothyroidism peripheral vascular disease, sleep apnea, right bundle branch block prior lumbar back surgery  Significant Hospital Events: Including procedures, antibiotic start and stop dates in addition to other pertinent events   4/25 Extensive L2-S1 lumbar surgery 4/30 New onset pain  5/1 Ongoing pain borderline blood pressure 5/2 Pain, leukocytosis fever, 5/3 Hypotension, not responding to crystalloid started on broad-spectrum antibiotics wound assessed by surgical team culture sent imaging pending ICU transfer. CT imaging of lumbar spine was negative for  definitive abscess there were stable fractures at L3 and L4 with possible new fracture involving the transverse process of L2 and L3 on the left postoperative changes noted.CT angio of chest was negative for pulmonary emboli had bibasilar airspace disease the right was a little worse than the left consistent with possible pneumonia there was aortic aneurysmal dilation of the ascending aorta measuring at 4.5 cm 5/4 Weaned off norepinephrine , Blood cultures from 1 out of 2 samples growing gram-negative rods with BC ID showing both proteus and Enterobacterales, MRSA PCR pending. Narrowed to ceftriaxone . 5/5 Remains on low dose NE. Febrile. BCx growing proteus.  5/6 Wound Cx with proteus, klebsiella aerogenes. Abx re-broadened to cefepime . 5/8 Required low dose norepi overnight but off this AM. His wound vac was changed this AM and has been having some pain 5/9 no issues overnight, denies any acute pain this a.m.  Interim History / Subjective:  States he feels well with no acute complaints, wife reports some altered mental status and excessive sleepiness  Objective:   Blood pressure (!) 125/50, pulse 68, temperature 99.6 F (37.6 C), temperature source Axillary, resp. rate 14, height 6' (1.829 m), weight 92.6 kg, SpO2 94%.        Intake/Output Summary (Last 24 hours) at 12/22/2023 0705 Last data filed at 12/22/2023 0600 Gross per 24 hour  Intake 568.6 ml  Output 2050 ml  Net -1481.4 ml   Filed Weights   12/17/23 0545 12/18/23 0615 12/20/23 2106  Weight: 92.2 kg 97.1 kg 92.6 kg   Physical Examination: General: Acute on chronic ill-appearing deconditioned elderly male lying in bed in no acute distress HEENT: [place]/AT, MM pink/moist, PERRL,  Neuro: Alert and oriented x 3, nonfocal CV: s1s2 regular rate and  rhythm, no murmur, rubs, or gallops,  PULM: Clear to auscultation bilaterally, slightly decreased, no increased work of breathing GI: soft, bowel sounds active in all 4 quadrants, non-tender,  non-distended, tolerating oral diet Extremities: warm/dry, no edema  Skin: no rashes or lesions  Resolved Hospital Problem List:   Septic shock Acute respiratory failure, secondary to pneumonia and atelectasis  Assessment & Plan:   Proteus bacteremia -Surgical wound culture also growing proteus  -Neurosurgery noted areas of dehiscence over surgical site 5/8, wound VAC replaced and attached external VAC sytem instead of the Prevena and plan to re-evaluate the wound on 5/12 with possible return to the OR for debridement and primary closure if needed P: Continue IV cefepime   Follow cultures Wound VAC per neurosurgery Continue midodrine  Continuous telemetry Prednisone  taper in place Continue midodrine   ID following, appreciate assistance  Status post redo of laminectomy L3-L4 with posterior fixation of L2-S1 4/25 -Initial surgery date 11/13/2023. P: Postoperative management per neurosurgery Wound VAC per neurosurgery as above Frequent neurovascular checks PT/OT Mobilize as able Multimodal pain control  PAF w/ RVR (new);  -Now back in NSR. No chest pain and asymptomatic  -Echo 5/5 with EF 55-60%, normal LV/RV function, no RWMAs, trivial MR/AR, large pericardial fat pad. CHA2DS2-VASc Score 3 (age, HTN history, PVD). Essential hypertension Hyperlipidemia PVD P: Continuous telemetry On Lovenox , defer additional AC plan to neurosurgery in the immediate postoperative. Optimize electrolytes Home antihypertensives remain on hold Continue statin  Acute metabolic encephalopathy secondary sepsis, improving P: Maintain neuro protective measures Nutrition and bowel regiment  Aspirations precautions  Source control as above Delirium precautions Ensure adequate sleep-wake cycle Continue Provigil    Postoperative anemia.   -Stable P: Trend CBC Transfuse per protocol Hemoglobin goal greater than 7  Hypothyroidism P: Continue Synthroid   At risk for hyperglycemia in the  setting of steroids P: Continue SSI CBG goal 140-180 CBG checks every ACHS  BPH P: Continue finasteride  and Flomax   GERD P: PPI  Pressure injuries, not POA  -Stage 2 buttock first assessed 5/3 and deep tissue right heel first assessed 5/7 P: Pressure alleviating devices  Optimize nutrition  Mobilize   Stable for transfer out of ICU PCCM will sign off. Will ask [facility] to take over medical consult. Thank you for the opportunity to participate in this patient's care. Please contact if we can be of further assistance.   Best Practice: (right click and "Reselect all SmartList Selections" daily)   Diet/type: Regular consistency (see orders) DVT prophylaxis LMWH Pressure ulcer(s):Stage 2 buttock first assessed 5/3 and deep tissue right heel first assessed 5/7 GI prophylaxis: PPI Lines: N/A Foley:  N/A Code Status:  full code Last date of multidisciplinary goals of care discussion [Per Primary Team]  Critical care time: NA    Mete Purdum D. Harris, NP-C [facility] Personal contact information can be found on Amion  If no contact or response made please call [phone] 12/22/2023, 9:25 AM

## 2023-12-22 NOTE — Plan of Care (Signed)
  Problem: Education: Goal: Knowledge of General Education information will improve Description: Including pain rating scale, medication(s)/side effects and non-pharmacologic comfort measures Outcome: Progressing   Problem: Health Behavior/Discharge Planning: Goal: Ability to manage health-related needs will improve Outcome: Progressing   Problem: Clinical Measurements: Goal: Ability to maintain clinical measurements within normal limits will improve Outcome: Progressing Goal: Will remain free from infection Outcome: Progressing Goal: Diagnostic test results will improve Outcome: Progressing Goal: Respiratory complications will improve Outcome: Progressing Goal: Cardiovascular complication will be avoided Outcome: Progressing   Problem: Activity: Goal: Risk for activity intolerance will decrease Outcome: Progressing   Problem: Nutrition: Goal: Adequate nutrition will be maintained Outcome: Progressing   Problem: Coping: Goal: Level of anxiety will decrease Outcome: Progressing   Problem: Elimination: Goal: Will not experience complications related to bowel motility Outcome: Progressing Goal: Will not experience complications related to urinary retention Outcome: Progressing   Problem: Pain Managment: Goal: General experience of comfort will improve and/or be controlled Outcome: Progressing   Problem: Safety: Goal: Ability to remain free from injury will improve Outcome: Progressing   Problem: Skin Integrity: Goal: Risk for impaired skin integrity will decrease Outcome: Progressing   Problem: Education: Goal: Ability to verbalize activity precautions or restrictions will improve Outcome: Progressing Goal: Knowledge of the prescribed therapeutic regimen will improve Outcome: Progressing Goal: Understanding of discharge needs will improve Outcome: Progressing   Problem: Activity: Goal: Ability to tolerate increased activity will improve Outcome:  Progressing Goal: Will remain free from falls Outcome: Progressing   Problem: Clinical Measurements: Goal: Ability to maintain clinical measurements within normal limits will improve Outcome: Progressing Goal: Postoperative complications will be avoided or minimized Outcome: Progressing Goal: Diagnostic test results will improve Outcome: Progressing   Problem: Pain Management: Goal: Pain level will decrease Outcome: Progressing   Problem: Skin Integrity: Goal: Will show signs of wound healing Outcome: Progressing   Problem: Health Behavior/Discharge Planning: Goal: Identification of resources available to assist in meeting health care needs will improve Outcome: Progressing   Problem: Bladder/Genitourinary: Goal: Urinary functional status for postoperative course will improve Outcome: Progressing   Problem: Activity: Goal: Ability to avoid complications of mobility impairment will improve Outcome: Not Progressing   Problem: Bowel/Gastric: Goal: Gastrointestinal status for postoperative course will improve Outcome: Not Progressing

## 2023-12-22 NOTE — Plan of Care (Signed)
 Transferred out of ICU to 4E.  Wound Vac intact on mid upper back.  Wife at bedside.   Problem: Education: Goal: Knowledge of General Education information will improve Description: Including pain rating scale, medication(s)/side effects and non-pharmacologic comfort measures Outcome: Progressing   Problem: Health Behavior/Discharge Planning: Goal: Ability to manage health-related needs will improve Outcome: Progressing   Problem: Clinical Measurements: Goal: Ability to maintain clinical measurements within normal limits will improve Outcome: Progressing Goal: Will remain free from infection Outcome: Progressing Goal: Diagnostic test results will improve Outcome: Progressing Goal: Respiratory complications will improve Outcome: Progressing

## 2023-12-23 DIAGNOSIS — Z981 Arthrodesis status: Secondary | ICD-10-CM | POA: Diagnosis not present

## 2023-12-23 DIAGNOSIS — G9341 Metabolic encephalopathy: Secondary | ICD-10-CM

## 2023-12-23 DIAGNOSIS — I480 Paroxysmal atrial fibrillation: Secondary | ICD-10-CM

## 2023-12-23 DIAGNOSIS — K219 Gastro-esophageal reflux disease without esophagitis: Secondary | ICD-10-CM

## 2023-12-23 DIAGNOSIS — M5440 Lumbago with sciatica, unspecified side: Secondary | ICD-10-CM | POA: Diagnosis not present

## 2023-12-23 DIAGNOSIS — D62 Acute posthemorrhagic anemia: Secondary | ICD-10-CM

## 2023-12-23 DIAGNOSIS — N400 Enlarged prostate without lower urinary tract symptoms: Secondary | ICD-10-CM

## 2023-12-23 DIAGNOSIS — R7881 Bacteremia: Secondary | ICD-10-CM | POA: Diagnosis not present

## 2023-12-23 LAB — CBC
HCT: 25.3 % — ABNORMAL LOW (ref 39.0–52.0)
Hemoglobin: 8.2 g/dL — ABNORMAL LOW (ref 13.0–17.0)
MCH: 31.1 pg (ref 26.0–34.0)
MCHC: 32.4 g/dL (ref 30.0–36.0)
MCV: 95.8 fL (ref 80.0–100.0)
Platelets: 219 10*3/uL (ref 150–400)
RBC: 2.64 MIL/uL — ABNORMAL LOW (ref 4.22–5.81)
RDW: 14.3 % (ref 11.5–15.5)
WBC: 8.9 10*3/uL (ref 4.0–10.5)
nRBC: 0 % (ref 0.0–0.2)

## 2023-12-23 LAB — BASIC METABOLIC PANEL WITH GFR
Anion gap: 9 (ref 5–15)
BUN: 11 mg/dL (ref 8–23)
CO2: 21 mmol/L — ABNORMAL LOW (ref 22–32)
Calcium: 8.1 mg/dL — ABNORMAL LOW (ref 8.9–10.3)
Chloride: 107 mmol/L (ref 98–111)
Creatinine, Ser: 0.65 mg/dL (ref 0.61–1.24)
GFR, Estimated: >60 mL/min (ref >60)
Glucose, Bld: 83 mg/dL (ref 70–99)
Potassium: 4 mmol/L (ref 3.5–5.1)
Sodium: 137 mmol/L (ref 135–145)

## 2023-12-23 LAB — GLUCOSE, CAPILLARY
Glucose-Capillary: 80 mg/dL (ref 70–99)
Glucose-Capillary: 151 mg/dL — ABNORMAL HIGH (ref 70–99)
Glucose-Capillary: 111 mg/dL — ABNORMAL HIGH (ref 70–99)
Glucose-Capillary: 97 mg/dL (ref 70–99)

## 2023-12-23 MED ORDER — POLYETHYLENE GLYCOL 3350 17 G PO PACK
17.0000 g | PACK | Freq: Every day | ORAL | Status: DC
  Administered 2023-12-23 – 2024-01-12 (×16): 17 g via ORAL
  Filled 2023-12-23 (×19): qty 1

## 2023-12-23 NOTE — Plan of Care (Signed)
   Problem: Education: Goal: Knowledge of General Education information will improve Description Including pain rating scale, medication(s)/side effects and non-pharmacologic comfort measures Outcome: Progressing   Problem: Health Behavior/Discharge Planning: Goal: Ability to manage health-related needs will improve Outcome: Progressing

## 2023-12-23 NOTE — Progress Notes (Signed)
 Patient ID: Norman Lambert, male   DOB: 09-Oct-1950, 73 y.o.   MRN: [id number] Alert and oriented x 4 Moves lower extremities Wound vac in place No neurological changes.

## 2023-12-23 NOTE — Progress Notes (Signed)
 Consult/PROGRESS NOTE    Norman Lambert  ZOX:[id number] DOB: [birth year] DOA: 12/05/2023 PCP: Patient, No Pcp Per    No chief complaint on file.   Brief Narrative:  73 year old male patient who was admitted on 4/25 for reduction and internal fixation of previous fusions involving L2-S1. Postoperative course initially seemed unremarkable pain had improved still not able to walk as of 4/29 on 4/30 began to have new increased pain involving the left foot, on 5/1 early in the morning continued to report 10 out of 10 pain seemingly not responding to as needed narcotics. Imaging of the back was unremarkable, he continued to have drain VAC in place in the evening hours of 5/2 began to have borderline hypotension with tachycardia 70 have shakes and tremors in the evening, temperature spike to 103 was started on IV cefepime  and vancomycin  with concern about sepsis white blood cell count elevated to 17,000 there was significant wound erythema with serosanguineous foul-smelling drainage from the surgical site. Initially internal medicine was consulted but after administering about a liter of crystalloid the patient remained hypotensive in the 70s so critical care was asked to evaluate for possible transfer to the intensive care. Levophed  subsequently weaned off and patient transferred to the floor, TRH consulted to help with chronic medical issues.  Significant Hospital Events: Including procedures, antibiotic start and stop dates in addition to other pertinent events   4/25 Extensive L2-S1 lumbar surgery 4/30 New onset pain  5/1 Ongoing pain borderline blood pressure 5/2 Pain, leukocytosis fever, 5/3 Hypotension, not responding to crystalloid started on broad-spectrum antibiotics wound assessed by surgical team culture sent imaging pending ICU transfer. CT imaging of lumbar spine was negative for definitive abscess there were stable fractures at L3 and L4 with possible new fracture involving the transverse  process of L2 and L3 on the left postoperative changes noted.CT angio of chest was negative for pulmonary emboli had bibasilar airspace disease the right was a little worse than the left consistent with possible pneumonia there was aortic aneurysmal dilation of the ascending aorta measuring at 4.5 cm 5/4 Weaned off norepinephrine , Blood cultures from 1 out of 2 samples growing gram-negative rods with BC ID showing both proteus and Enterobacterales, MRSA PCR pending. Narrowed to ceftriaxone . 5/5 Remains on low dose NE. Febrile. BCx growing proteus.  5/6 Wound Cx with proteus, klebsiella aerogenes. Abx re-broadened to cefepime . 5/8 Required low dose norepi overnight but off this AM. His wound vac was changed this AM and has been having some pain 5/9 no issues overnight, denies any acute pain this a.m.  Assessment & Plan:   Principal Problem:   Back pain Active Problems:   S/P lumbar fusion   Septic shock (HCC)   Bacteremia due to Proteus species   Pressure injury of skin   Paroxysmal A-fib (HCC)   Acute postoperative anemia due to expected blood loss   Benign prostatic hyperplasia   Gastroesophageal reflux disease   Acute metabolic encephalopathy  #1 septic shock secondary to Proteus bacteremia with assumed source as the surgical site/possible pneumonia -Surgical wound cultures growing Klebsiella aerogenes and Proteus Mirabilis - Blood cultures from 12/16/2023 with Proteus Mirabilis. - It is noted that neurosurgery noticed wounds of dehiscence over surgical site 5/8, wound VAC replaced and attached external VAC system instead of the Prevena and plan is to reevaluate the wound on 12/25/2023 with possible return to the OR for debridement and primary closure if needed. - Continue current antibiotics of IV cefepime . - Continue midodrine . - Continue  steroid taper. - ID following, antibiotic recommendations and coverage as per ID. - Wound VAC in wound management per neurosurgery.  2.  Status  post redo of laminectomy L3-4 with posterior fixation L2-S1 4/25 -PT/OT. -Pain management per primary team, neurosurgery. -Per primary team, neurosurgery.  3.  Paroxysmal A-fib with RVR CHA2DS2VASC score 3. -Patient noted to be back in normal sinus rhythm. - Likely triggered by acute infection. - Patient asymptomatic. - 2D echo with a EF of 55 to 60%,NWMA, moderate eccentric LVH of inferior segment.  Normal right ventricular systolic function.  Large epicardial fat pad.  Trivial MR/AR mild aortic valve stenosis. - Currently on Lovenox , will defer anticoagulation plan to primary team, neurosurgery in the immediate postop period.  4.  Hyperlipidemia -Continue statin.  5.  Hypertension -BP stable. - Patient on midodrine . - Continue to hold home regimen of antihypertensive medications.  6.  Peripheral vascular disease -Continue statin.  7.  Acute metabolic encephalopathy secondary to sepsis -Improving clinically. - Delirium precautions. - Ensure adequate sleep-wake cycle. - Continue Provigil . - Continue nutrition and bowel regimen.  8.  Postop acute blood loss anemia -Hemoglobin currently stable at 8.2. - Follow H&H. - Transfusion threshold hemoglobin < 7.  9.  BPH -Continue Flomax , finasteride .  10.  GERD -PPI.  11.  Hypothyroidism -Synthroid .  12.  Hyperglycemia in the setting of steroids -CBG noted at 80 this morning. - SSI.  13.  Pressure injuries, not POA Pressure Injury 12/16/23 Buttocks Stage 2 -  Partial thickness loss of dermis presenting as a shallow open injury with a red, pink wound bed without slough. (Active)  12/16/23 1300  Location: Buttocks  Location Orientation:   Staging: Stage 2 -  Partial thickness loss of dermis presenting as a shallow open injury with a red, pink wound bed without slough.  Wound Description (Comments):   Present on Admission:      Pressure Injury 12/16/23 Toe (Comment  which one) Anterior;Left Deep Tissue Pressure Injury -  Purple or maroon localized area of discolored intact skin or blood-filled blister due to damage of underlying soft tissue from pressure and/or shear. (Active)  12/16/23 1300  Location: Toe (Comment  which one)  Location Orientation: Anterior;Left  Staging: Deep Tissue Pressure Injury - Purple or maroon localized area of discolored intact skin or blood-filled blister due to damage of underlying soft tissue from pressure and/or shear.  Wound Description (Comments):   Present on Admission:      Pressure Injury 12/20/23 Heel Right Deep Tissue Pressure Injury - Purple or maroon localized area of discolored intact skin or blood-filled blister due to damage of underlying soft tissue from pressure and/or shear. black (Active)  12/20/23 2000  Location: Heel  Location Orientation: Right  Staging: Deep Tissue Pressure Injury - Purple or maroon localized area of discolored intact skin or blood-filled blister due to damage of underlying soft tissue from pressure and/or shear.  Wound Description (Comments): black  Present on Admission: No       DVT prophylaxis: Lovenox  Code Status: Full Family Communication: Updated patient and wife at bedside. Disposition: Per primary team  Status is: Inpatient Remains inpatient appropriate because: Severity of illness   Consultants:  TRH: Dr. Osborne Blazer 12/16/2023  PCCM: Dr. Diania Fortes 12/16/2023 ID: Dr. Zelda Hickman 12/18/2023  Procedures:  Redo decompressive laminectomy L3-4 for recurrent stenosis with removal of epidural bone graft material/posterior fixation L2-S1 inclusive using Alphatec cortical pedicle screw/intertransverse arthrodesis L2-S1 inclusive using morselized autograft and allograft/exploration of fusion L3-4 with removal of instrumentation  per Neurosurgery: Dr. Rochelle Chu 12/08/2023 2D echo 12/18/2023  Antimicrobials:  Anti-infectives (From admission, onward)    Start     Dose/Rate Route Frequency Ordered Stop   12/19/23 1300  ceFEPIme  (MAXIPIME ) 2 g in sodium  chloride 0.9 % 100 mL IVPB        2 g 200 mL/hr over 30 Minutes Intravenous Every 8 hours 12/19/23 1116     12/17/23 1000  vancomycin  (VANCOREADY) IVPB 1500 mg/300 mL  Status:  Discontinued       Placed in "Followed by" Linked Group   1,500 mg 150 mL/hr over 120 Minutes Intravenous Every 24 hours 12/16/23 0920 12/17/23 1009   12/17/23 1000  cefTRIAXone  (ROCEPHIN ) 2 g in sodium chloride  0.9 % 100 mL IVPB  Status:  Discontinued        2 g 200 mL/hr over 30 Minutes Intravenous Every 24 hours 12/17/23 0159 12/19/23 1116   12/16/23 1030  vancomycin  (VANCOREADY) IVPB 2000 mg/400 mL       Placed in "Followed by" Linked Group   2,000 mg 200 mL/hr over 120 Minutes Intravenous  Once 12/16/23 0920 12/16/23 1359   12/16/23 1015  ceFEPIme  (MAXIPIME ) 2 g in sodium chloride  0.9 % 100 mL IVPB  Status:  Discontinued        2 g 200 mL/hr over 30 Minutes Intravenous Every 8 hours 12/16/23 0920 12/17/23 0158   12/08/23 1900  ceFAZolin  (ANCEF ) IVPB 2g/100 mL premix        2 g 200 mL/hr over 30 Minutes Intravenous Every 8 hours 12/08/23 1721 12/09/23 0452   12/08/23 1015  ceFAZolin  (ANCEF ) IVPB 2g/100 mL premix        2 g 200 mL/hr over 30 Minutes Intravenous On call to O.R. 12/08/23 1000 12/08/23 1105   12/08/23 0952  ceFAZolin  (ANCEF ) 2-4 GM/100ML-% IVPB       Note to Pharmacy: Noelle Batten M: cabinet override      12/08/23 0952 12/08/23 1105         Subjective: Patient laying in bed.  Denies any chest pain or significant shortness of breath.  No abdominal pain.  Patient states 1 tries to hold a cup keeps dropping things in his hands.  Wife at bedside.  Objective: Vitals:   12/23/23 0347 12/23/23 0358 12/23/23 0842 12/23/23 1227  BP:  (!) 124/59 (!) 120/55 (!) 120/56  Pulse: 63 63 73 62  Resp: 13 12 14 10   Temp: 99 F (37.2 C) 99 F (37.2 C) 98.2 F (36.8 C) 98 F (36.7 C)  TempSrc:  Oral Oral Oral  SpO2: 97% 98% 98% 98%  Weight:      Height:        Intake/Output Summary (Last 24  hours) at 12/23/2023 1631 Last data filed at 12/23/2023 1610 Gross per 24 hour  Intake 780 ml  Output 1000 ml  Net -220 ml   Filed Weights   12/17/23 0545 12/18/23 0615 12/20/23 2106  Weight: 92.2 kg 97.1 kg 92.6 kg    Examination:  General exam: Appears calm and comfortable  Respiratory system: Clear to auscultation anterior lung fields. Respiratory effort normal. Cardiovascular system: S1 & S2 heard, RRR. No JVD, murmurs, rubs, gallops or clicks. No pedal edema. Gastrointestinal system: Abdomen is nondistended, soft and nontender. No organomegaly or masses felt. Normal bowel sounds heard. Central nervous system: Alert and oriented. No focal neurological deficits. Extremities:  Skin: No rashes, lesions or ulcers Psychiatry: Judgement and insight appear fair. Mood & affect appropriate.  Data Reviewed: I have personally reviewed following labs and imaging studies  CBC: Recent Labs  Lab 12/19/23 0241 12/20/23 0345 12/21/23 0417 12/22/23 0422 12/23/23 0358  WBC 14.2* 8.9 7.2 8.5 8.9  NEUTROABS  --  8.0*  --   --   --   HGB 8.7* 7.9* 8.1* 8.0* 8.2*  HCT 26.4* 23.6* 24.4* 24.6* 25.3*  MCV 92.0 92.9 94.2 93.9 95.8  PLT 156 164 162 211 219    Basic Metabolic Panel: Recent Labs  Lab 12/17/23 1118 12/18/23 0259 12/19/23 0241 12/20/23 0345 12/21/23 0417 12/22/23 0422 12/23/23 0358  NA  --  130* 132* 134* 136 137 137  K  --  3.8 4.0 3.6 3.9 3.9 4.0  CL  --  100 99 100 106 104 107  CO2  --  19* 22 25 24 25  21*  GLUCOSE  --  102* 175* 104* 90 77 83  BUN  --  11 12 16 16 13 11   CREATININE  --  0.55* 0.51* 0.56* 0.57* 0.60* 0.65  CALCIUM   --  7.7* 7.9* 7.9* 7.9* 8.1* 8.1*  MG 1.3* 1.7 2.1  --  1.8 2.0  --   PHOS  --  2.2* 4.1  --  3.0  --   --     GFR: Estimated Creatinine Clearance: 88.9 mL/min (by C-G formula based on SCr of 0.65 mg/dL).  Liver Function Tests: No results for input(s): "AST", "ALT", "ALKPHOS", "BILITOT", "PROT", "ALBUMIN " in the last 168  hours.  CBG: Recent Labs  Lab 12/22/23 1642 12/22/23 1926 12/22/23 2124 12/23/23 0605 12/23/23 1227  GLUCAP 135* 103* 105* 80 151*     Recent Results (from the past 240 hours)  Culture, blood (Routine X 2) w Reflex to ID Panel     Status: Abnormal   Collection Time: 12/16/23 10:49 AM   Specimen: BLOOD RIGHT HAND  Result Value Ref Range Status   Specimen Description BLOOD RIGHT HAND  Final   Special Requests   Final    BOTTLES DRAWN AEROBIC AND ANAEROBIC Blood Culture adequate volume   Culture  Setup Time   Final    GRAM NEGATIVE RODS IN BOTH AEROBIC AND ANAEROBIC BOTTLES CRITICAL RESULT CALLED TO, READ BACK BY AND VERIFIED WITH: PHARMD V BRYK 12/17/2023 @ 0131 BY AB Performed at [facility], [address] [place], [place] [address]    Culture PROTEUS MIRABILIS (A)  Final   Report Status 12/18/2023 FINAL  Final   Organism ID, Bacteria PROTEUS MIRABILIS  Final   Organism ID, Bacteria PROTEUS MIRABILIS  Final      Susceptibility   Proteus mirabilis - KIRBY BAUER*    CEFAZOLIN  INTERMEDIATE Intermediate    Proteus mirabilis - MIC*    AMPICILLIN <=2 SENSITIVE Sensitive     CEFEPIME  <=0.12 SENSITIVE Sensitive     CEFTAZIDIME <=1 SENSITIVE Sensitive     CEFTRIAXONE  <=0.25 SENSITIVE Sensitive     CIPROFLOXACIN <=0.25 SENSITIVE Sensitive     GENTAMICIN <=1 SENSITIVE Sensitive     IMIPENEM 2 SENSITIVE Sensitive     TRIMETH/SULFA <=20 SENSITIVE Sensitive     AMPICILLIN/SULBACTAM <=2 SENSITIVE Sensitive     PIP/TAZO <=4 SENSITIVE Sensitive ug/mL    * PROTEUS MIRABILIS    PROTEUS MIRABILIS  Blood Culture ID Panel (Reflexed)     Status: Abnormal   Collection Time: 12/16/23 10:49 AM  Result Value Ref Range Status   Enterococcus faecalis NOT DETECTED NOT DETECTED Final   Enterococcus Faecium NOT DETECTED NOT  DETECTED Final   Listeria monocytogenes NOT DETECTED NOT DETECTED Final   Staphylococcus species NOT DETECTED NOT DETECTED Final   Staphylococcus aureus (BCID) NOT  DETECTED NOT DETECTED Final   Staphylococcus epidermidis NOT DETECTED NOT DETECTED Final   Staphylococcus lugdunensis NOT DETECTED NOT DETECTED Final   Streptococcus species NOT DETECTED NOT DETECTED Final   Streptococcus agalactiae NOT DETECTED NOT DETECTED Final   Streptococcus pneumoniae NOT DETECTED NOT DETECTED Final   Streptococcus pyogenes NOT DETECTED NOT DETECTED Final   A.calcoaceticus-baumannii NOT DETECTED NOT DETECTED Final   Bacteroides fragilis NOT DETECTED NOT DETECTED Final   Enterobacterales DETECTED (A) NOT DETECTED Final    Comment: Enterobacterales represent a large order of gram negative bacteria, not a single organism. CRITICAL RESULT CALLED TO, READ BACK BY AND VERIFIED WITH: PHARMD V BRYK 12/17/2023 @ 0131 BY AB    Enterobacter cloacae complex NOT DETECTED NOT DETECTED Final   Escherichia coli NOT DETECTED NOT DETECTED Final   Klebsiella aerogenes NOT DETECTED NOT DETECTED Final   Klebsiella oxytoca NOT DETECTED NOT DETECTED Final   Klebsiella pneumoniae NOT DETECTED NOT DETECTED Final   Proteus species DETECTED (A) NOT DETECTED Final    Comment: CRITICAL RESULT CALLED TO, READ BACK BY AND VERIFIED WITH: PHARMD V BRYK 12/17/2023 @ 0131 BY AB    Salmonella species NOT DETECTED NOT DETECTED Final   Serratia marcescens NOT DETECTED NOT DETECTED Final   Haemophilus influenzae NOT DETECTED NOT DETECTED Final   Neisseria meningitidis NOT DETECTED NOT DETECTED Final   Pseudomonas aeruginosa NOT DETECTED NOT DETECTED Final   Stenotrophomonas maltophilia NOT DETECTED NOT DETECTED Final   Candida albicans NOT DETECTED NOT DETECTED Final   Candida auris NOT DETECTED NOT DETECTED Final   Candida glabrata NOT DETECTED NOT DETECTED Final   Candida krusei NOT DETECTED NOT DETECTED Final   Candida parapsilosis NOT DETECTED NOT DETECTED Final   Candida tropicalis NOT DETECTED NOT DETECTED Final   Cryptococcus neoformans/gattii NOT DETECTED NOT DETECTED Final   CTX-M ESBL NOT  DETECTED NOT DETECTED Final   Carbapenem resistance IMP NOT DETECTED NOT DETECTED Final   Carbapenem resistance KPC NOT DETECTED NOT DETECTED Final   Carbapenem resistance NDM NOT DETECTED NOT DETECTED Final   Carbapenem resist OXA 48 LIKE NOT DETECTED NOT DETECTED Final   Carbapenem resistance VIM NOT DETECTED NOT DETECTED Final    Comment: Performed at [facility], [address] [place], [place] [address]  Aerobic/Anaerobic Culture w Gram Stain (surgical/deep wound)     Status: None   Collection Time: 12/16/23 11:11 AM   Specimen: Wound  Result Value Ref Range Status   Specimen Description WOUND  Final   Special Requests NONE  Final   Gram Stain   Final    RARE SQUAMOUS EPITHELIAL CELLS PRESENT RARE WBC PRESENT, PREDOMINANTLY PMN RARE GRAM NEGATIVE RODS    Culture   Final    FEW KLEBSIELLA AEROGENES FEW PROTEUS MIRABILIS FEW DIPHTHEROIDS(CORYNEBACTERIUM SPECIES) Standardized susceptibility testing for this organism is not available. NO ANAEROBES ISOLATED Performed at [facility], [address] [place], [place] [address]    Report Status 12/21/2023 FINAL  Final   Organism ID, Bacteria KLEBSIELLA AEROGENES  Final   Organism ID, Bacteria PROTEUS MIRABILIS  Final      Susceptibility   Klebsiella aerogenes - MIC*    CEFEPIME  <=0.12 SENSITIVE Sensitive     CEFTAZIDIME <=1 SENSITIVE Sensitive     CEFTRIAXONE  <=0.25 SENSITIVE Sensitive     CIPROFLOXACIN <=  0.25 SENSITIVE Sensitive     GENTAMICIN <=1 SENSITIVE Sensitive     IMIPENEM 1 SENSITIVE Sensitive     TRIMETH/SULFA <=20 SENSITIVE Sensitive     PIP/TAZO <=4 SENSITIVE Sensitive ug/mL    * FEW KLEBSIELLA AEROGENES   Proteus mirabilis - MIC*    AMPICILLIN <=2 SENSITIVE Sensitive     CEFEPIME  <=0.12 SENSITIVE Sensitive     CEFTAZIDIME <=1 SENSITIVE Sensitive     CEFTRIAXONE  <=0.25 SENSITIVE Sensitive     CIPROFLOXACIN <=0.25 SENSITIVE Sensitive     GENTAMICIN <=1 SENSITIVE Sensitive     IMIPENEM 2 SENSITIVE  Sensitive     TRIMETH/SULFA <=20 SENSITIVE Sensitive     AMPICILLIN/SULBACTAM <=2 SENSITIVE Sensitive     PIP/TAZO <=4 SENSITIVE Sensitive ug/mL    * FEW PROTEUS MIRABILIS  Culture, blood (Routine X 2) w Reflex to ID Panel     Status: None   Collection Time: 12/16/23  1:03 PM   Specimen: BLOOD LEFT ARM  Result Value Ref Range Status   Specimen Description BLOOD LEFT ARM  Final   Special Requests   Final    BOTTLES DRAWN AEROBIC AND ANAEROBIC Blood Culture adequate volume   Culture   Final    NO GROWTH 5 DAYS Performed at [facility], [address] [place], [place] [address]    Report Status 12/21/2023 FINAL  Final         Radiology Studies: No results found.      Scheduled Meds:  acetaminophen   650 mg Oral Q8H   vitamin C   1,000 mg Oral Daily   Chlorhexidine  Gluconate Cloth  6 each Topical Daily   cyanocobalamin   1,000 mcg Oral Daily   enoxaparin  (LOVENOX ) injection  50 mg Subcutaneous Q24H   feeding supplement  237 mL Oral BID BM   finasteride   5 mg Oral Daily   folic acid   1 mg Oral Daily   gabapentin   300 mg Oral TID   insulin  aspart  0-5 Units Subcutaneous QHS   insulin  aspart  0-9 Units Subcutaneous TID WC   levothyroxine   112 mcg Oral Q0600   lidocaine   2 patch Transdermal Q24H   loratadine   10 mg Oral Daily   melatonin  3 mg Oral QHS   midodrine   10 mg Oral TID WC   modafinil   200 mg Oral Daily   oxyCODONE   20 mg Oral Q12H   pantoprazole   40 mg Oral Daily   polyethylene glycol  17 g Oral Daily   predniSONE   15 mg Oral Q breakfast   Followed by   [name] ON 12/27/2023] predniSONE   10 mg Oral Q breakfast   Followed by   [name] ON 01/03/2024] predniSONE   5 mg Oral Q breakfast   Followed by   [name] ON 01/10/2024] predniSONE   2.5 mg Oral Q breakfast   rosuvastatin   5 mg Oral Daily   senna  1 tablet Oral BID   sodium chloride  flush  3 mL Intravenous Q12H   zinc  sulfate (50mg  elemental zinc )  220 mg Oral Daily   Continuous Infusions:  ceFEPime   (MAXIPIME ) IV 2 g (12/23/23 1235)     LOS: 18 days    Time spent: 40 minutes    Hilda Lovings, MD [facility]   To contact the attending provider between 7A-7P or the covering provider during after hours 7P-7A, please log into the web site [email or web address] and access using universal [place] password for that web site. If you do not have the  password, please call the hospital operator.  12/23/2023, 4:31 PM

## 2023-12-24 ENCOUNTER — Inpatient Hospital Stay (HOSPITAL_COMMUNITY)

## 2023-12-24 DIAGNOSIS — I480 Paroxysmal atrial fibrillation: Secondary | ICD-10-CM | POA: Diagnosis not present

## 2023-12-24 DIAGNOSIS — R7881 Bacteremia: Secondary | ICD-10-CM | POA: Diagnosis not present

## 2023-12-24 DIAGNOSIS — M5440 Lumbago with sciatica, unspecified side: Secondary | ICD-10-CM | POA: Diagnosis not present

## 2023-12-24 DIAGNOSIS — Z981 Arthrodesis status: Secondary | ICD-10-CM | POA: Diagnosis not present

## 2023-12-24 LAB — GLUCOSE, CAPILLARY
Glucose-Capillary: 67 mg/dL — ABNORMAL LOW (ref 70–99)
Glucose-Capillary: 117 mg/dL — ABNORMAL HIGH (ref 70–99)
Glucose-Capillary: 109 mg/dL — ABNORMAL HIGH (ref 70–99)
Glucose-Capillary: 102 mg/dL — ABNORMAL HIGH (ref 70–99)
Glucose-Capillary: 105 mg/dL — ABNORMAL HIGH (ref 70–99)

## 2023-12-24 LAB — BASIC METABOLIC PANEL WITH GFR
Anion gap: 8 (ref 5–15)
BUN: 13 mg/dL (ref 8–23)
CO2: 25 mmol/L (ref 22–32)
Calcium: 8.5 mg/dL — ABNORMAL LOW (ref 8.9–10.3)
Chloride: 104 mmol/L (ref 98–111)
Creatinine, Ser: 0.67 mg/dL (ref 0.61–1.24)
GFR, Estimated: >60 mL/min (ref >60)
Glucose, Bld: 84 mg/dL (ref 70–99)
Potassium: 3.9 mmol/L (ref 3.5–5.1)
Sodium: 137 mmol/L (ref 135–145)

## 2023-12-24 LAB — CBC
HCT: 26.4 % — ABNORMAL LOW (ref 39.0–52.0)
Hemoglobin: 8.5 g/dL — ABNORMAL LOW (ref 13.0–17.0)
MCH: 30.7 pg (ref 26.0–34.0)
MCHC: 32.2 g/dL (ref 30.0–36.0)
MCV: 95.3 fL (ref 80.0–100.0)
Platelets: 258 10*3/uL (ref 150–400)
RBC: 2.77 MIL/uL — ABNORMAL LOW (ref 4.22–5.81)
RDW: 14 % (ref 11.5–15.5)
WBC: 9.3 10*3/uL (ref 4.0–10.5)
nRBC: 0 % (ref 0.0–0.2)

## 2023-12-24 LAB — MAGNESIUM: Magnesium: 1.7 mg/dL (ref 1.7–2.4)

## 2023-12-24 MED ORDER — MAGNESIUM SULFATE 2 GM/50ML IV SOLN
2.0000 g | Freq: Once | INTRAVENOUS | Status: AC
  Administered 2023-12-24: 2 g via INTRAVENOUS
  Filled 2023-12-24: qty 50

## 2023-12-24 MED ORDER — DEXTROSE 50 % IV SOLN
INTRAVENOUS | Status: AC
Start: 2023-12-24 — End: 2023-12-24
  Administered 2023-12-24: 50 mL
  Filled 2023-12-24: qty 50

## 2023-12-24 NOTE — Plan of Care (Signed)
   Problem: Education: Goal: Knowledge of General Education information will improve Description Including pain rating scale, medication(s)/side effects and non-pharmacologic comfort measures Outcome: Progressing   Problem: Health Behavior/Discharge Planning: Goal: Ability to manage health-related needs will improve Outcome: Progressing

## 2023-12-24 NOTE — Progress Notes (Signed)
 Patient ID: Norman Lambert, male   DOB: 07/31/1951, 73 y.o.   MRN: [id number] BP (!) 133/53 (BP Location: Right Arm)   Pulse 73   Temp 98.1 F (36.7 C) (Oral)   Resp 14   Ht 6' (1.829 m)   Wt 92.6 kg   SpO2 97%   BMI 27.69 kg/m  Wound vac in place Patient sleeping currently No problems per nursing

## 2023-12-24 NOTE — Progress Notes (Signed)
 Consult/PROGRESS NOTE    Norman Lambert  ONG:[id number] DOB: 1951/04/07 DOA: 12/05/2023 PCP: Patient, No Pcp Per    No chief complaint on file.   Brief Narrative:  73 year old male patient who was admitted on 4/25 for reduction and internal fixation of previous fusions involving L2-S1. Postoperative course initially seemed unremarkable pain had improved still not able to walk as of 4/29 on 4/30 began to have new increased pain involving the left foot, on 5/1 early in the morning continued to report 10 out of 10 pain seemingly not responding to as needed narcotics. Imaging of the back was unremarkable, he continued to have drain VAC in place in the evening hours of 5/2 began to have borderline hypotension with tachycardia 70 have shakes and tremors in the evening, temperature spike to 103 was started on IV cefepime  and vancomycin  with concern about sepsis white blood cell count elevated to 17,000 there was significant wound erythema with serosanguineous foul-smelling drainage from the surgical site. Initially internal medicine was consulted but after administering about a liter of crystalloid the patient remained hypotensive in the 70s so critical care was asked to evaluate for possible transfer to the intensive care. Levophed  subsequently weaned off and patient transferred to the floor, TRH consulted to help with chronic medical issues.  Significant Hospital Events: Including procedures, antibiotic start and stop dates in addition to other pertinent events   4/25 Extensive L2-S1 lumbar surgery 4/30 New onset pain  5/1 Ongoing pain borderline blood pressure 5/2 Pain, leukocytosis fever, 5/3 Hypotension, not responding to crystalloid started on broad-spectrum antibiotics wound assessed by surgical team culture sent imaging pending ICU transfer. CT imaging of lumbar spine was negative for definitive abscess there were stable fractures at L3 and L4 with possible new fracture involving the transverse  process of L2 and L3 on the left postoperative changes noted.CT angio of chest was negative for pulmonary emboli had bibasilar airspace disease the right was a little worse than the left consistent with possible pneumonia there was aortic aneurysmal dilation of the ascending aorta measuring at 4.5 cm 5/4 Weaned off norepinephrine , Blood cultures from 1 out of 2 samples growing gram-negative rods with BC ID showing both proteus and Enterobacterales, MRSA PCR pending. Narrowed to ceftriaxone . 5/5 Remains on low dose NE. Febrile. BCx growing proteus.  5/6 Wound Cx with proteus, klebsiella aerogenes. Abx re-broadened to cefepime . 5/8 Required low dose norepi overnight but off this AM. His wound vac was changed this AM and has been having some pain 5/9 no issues overnight, denies any acute pain this a.m.  Assessment & Plan:   Principal Problem:   Back pain Active Problems:   S/P lumbar fusion   Septic shock (HCC)   Bacteremia due to Proteus species   Pressure injury of skin   Paroxysmal A-fib (HCC)   Acute postoperative anemia due to expected blood loss   Benign prostatic hyperplasia   Gastroesophageal reflux disease   Acute metabolic encephalopathy  #1 septic shock secondary to Proteus bacteremia with assumed source as the surgical site/possible pneumonia -Surgical wound cultures growing Klebsiella aerogenes and Proteus Mirabilis - Blood cultures from 12/16/2023 with Proteus Mirabilis. - It is noted that neurosurgery noticed wounds of dehiscence over surgical site 5/8, wound VAC replaced and attached external VAC system instead of the Prevena and plan is to reevaluate the wound on 12/25/2023 with possible return to the OR for debridement and primary closure if needed. - Continue current antibiotics of IV cefepime . - Continue midodrine . - Continue  steroid taper. - ID following, antibiotic recommendations and coverage as per ID. - Wound VAC in wound management per neurosurgery.  2.  Status  post redo of laminectomy L3-4 with posterior fixation L2-S1 4/25 -PT/OT. -Pain management per primary team, neurosurgery. -Per primary team, neurosurgery.  3.  Paroxysmal A-fib with RVR CHA2DS2VASC score 3. -Patient noted to be back in normal sinus rhythm. - Likely triggered by acute infection. - Patient asymptomatic. - 2D echo with a EF of 55 to 60%,NWMA, moderate eccentric LVH of inferior segment.  Normal right ventricular systolic function.  Large epicardial fat pad.  Trivial MR/AR mild aortic valve stenosis. - Currently on Lovenox , will defer anticoagulation plan to primary team, neurosurgery in the immediate postop period.  4.  Hyperlipidemia -Continue statin.  5.  Hypertension -BP stable. - Patient on midodrine . - Continue to hold home regimen of antihypertensive medications.  6.  Peripheral vascular disease - Statin.  7.  Acute metabolic encephalopathy secondary to sepsis - Slowly improving clinically however still with bouts of confusion.  - Delirium precautions. - Ensure adequate sleep-wake cycle. - Continue Provigil . - Continue nutrition and bowel regimen.  8.  Postop acute blood loss anemia -Hemoglobin currently stable at 8.5. - Follow H&H. - Transfusion threshold hemoglobin < 7.  9.  BPH -Continue Flomax , finasteride .  10.  GERD - Continue PPI.  11.  Hypothyroidism - Continue Synthroid .  12.  Hyperglycemia in the setting of steroids -CBG noted at 67 this morning. - SSI.  13.  Pressure injuries, not POA Pressure Injury 12/16/23 Buttocks Stage 2 -  Partial thickness loss of dermis presenting as a shallow open injury with a red, pink wound bed without slough. (Active)  12/16/23 1300  Location: Buttocks  Location Orientation:   Staging: Stage 2 -  Partial thickness loss of dermis presenting as a shallow open injury with a red, pink wound bed without slough.  Wound Description (Comments):   Present on Admission:      Pressure Injury 12/16/23 Toe  (Comment  which one) Anterior;Left Deep Tissue Pressure Injury - Purple or maroon localized area of discolored intact skin or blood-filled blister due to damage of underlying soft tissue from pressure and/or shear. (Active)  12/16/23 1300  Location: Toe (Comment  which one)  Location Orientation: Anterior;Left  Staging: Deep Tissue Pressure Injury - Purple or maroon localized area of discolored intact skin or blood-filled blister due to damage of underlying soft tissue from pressure and/or shear.  Wound Description (Comments):   Present on Admission:      Pressure Injury 12/20/23 Heel Right Deep Tissue Pressure Injury - Purple or maroon localized area of discolored intact skin or blood-filled blister due to damage of underlying soft tissue from pressure and/or shear. black (Active)  12/20/23 2000  Location: Heel  Location Orientation: Right  Staging: Deep Tissue Pressure Injury - Purple or maroon localized area of discolored intact skin or blood-filled blister due to damage of underlying soft tissue from pressure and/or shear.  Wound Description (Comments): black  Present on Admission: No       DVT prophylaxis: Lovenox  Code Status: Full Family Communication: Updated patient and wife at bedside. Disposition: Per primary team  Status is: Inpatient Remains inpatient appropriate because: Severity of illness   Consultants:  TRH: Dr. Osborne Blazer 12/16/2023  PCCM: Dr. Diania Fortes 12/16/2023 ID: Dr. Zelda Hickman 12/18/2023  Procedures:  Redo decompressive laminectomy L3-4 for recurrent stenosis with removal of epidural bone graft material/posterior fixation L2-S1 inclusive using Alphatec cortical pedicle screw/intertransverse arthrodesis L2-S1  inclusive using morselized autograft and allograft/exploration of fusion L3-4 with removal of instrumentation per Neurosurgery: Dr. Rochelle Chu 12/08/2023 2D echo 12/18/2023  Antimicrobials:  Anti-infectives (From admission, onward)    Start     Dose/Rate Route Frequency  Ordered Stop   12/19/23 1300  ceFEPIme  (MAXIPIME ) 2 g in sodium chloride  0.9 % 100 mL IVPB        2 g 200 mL/hr over 30 Minutes Intravenous Every 8 hours 12/19/23 1116     12/17/23 1000  vancomycin  (VANCOREADY) IVPB 1500 mg/300 mL  Status:  Discontinued       Placed in "Followed by" Linked Group   1,500 mg 150 mL/hr over 120 Minutes Intravenous Every 24 hours 12/16/23 0920 12/17/23 1009   12/17/23 1000  cefTRIAXone  (ROCEPHIN ) 2 g in sodium chloride  0.9 % 100 mL IVPB  Status:  Discontinued        2 g 200 mL/hr over 30 Minutes Intravenous Every 24 hours 12/17/23 0159 12/19/23 1116   12/16/23 1030  vancomycin  (VANCOREADY) IVPB 2000 mg/400 mL       Placed in "Followed by" Linked Group   2,000 mg 200 mL/hr over 120 Minutes Intravenous  Once 12/16/23 0920 12/16/23 1359   12/16/23 1015  ceFEPIme  (MAXIPIME ) 2 g in sodium chloride  0.9 % 100 mL IVPB  Status:  Discontinued        2 g 200 mL/hr over 30 Minutes Intravenous Every 8 hours 12/16/23 0920 12/17/23 0158   12/08/23 1900  ceFAZolin  (ANCEF ) IVPB 2g/100 mL premix        2 g 200 mL/hr over 30 Minutes Intravenous Every 8 hours 12/08/23 1721 12/09/23 0452   12/08/23 1015  ceFAZolin  (ANCEF ) IVPB 2g/100 mL premix        2 g 200 mL/hr over 30 Minutes Intravenous On call to O.R. 12/08/23 1000 12/08/23 1105   12/08/23 0952  ceFAZolin  (ANCEF ) 2-4 GM/100ML-% IVPB       Note to Pharmacy: Noelle Batten M: cabinet override      12/08/23 0952 12/08/23 1105         Subjective: Patient lying in bed.  Alert and oriented.  Denies any chest pain or shortness of breath.  No abdominal pain.  Wife at bedside.   Objective: Vitals:   12/23/23 2005 12/24/23 0008 12/24/23 0419 12/24/23 0737  BP: (!) 137/56 (!) 127/58 120/64 (!) 106/57  Pulse: 61 65 68 70  Resp: 20 20 20 12   Temp: 98.8 F (37.1 C) 98.3 F (36.8 C) 98.1 F (36.7 C) 98.1 F (36.7 C)  TempSrc: Oral Tympanic Oral Oral  SpO2: 98% 96% 98% 100%  Weight:      Height:         Intake/Output Summary (Last 24 hours) at 12/24/2023 1003 Last data filed at 12/24/2023 0011 Gross per 24 hour  Intake 200 ml  Output 900 ml  Net -700 ml   Filed Weights   12/17/23 0545 12/18/23 0615 12/20/23 2106  Weight: 92.2 kg 97.1 kg 92.6 kg    Examination:  General exam: Appears calm and comfortable  Respiratory system: Coarse breath sounds anterior lung fields.  No wheezing.  Fair air movement.  Cardiovascular system: RRR no murmurs rubs or gallops.  No JVD.  No pitting lower extremity edema. Gastrointestinal system: Abdomen is soft, nontender, nondistended, positive bowel sounds.  No rebound.  No guarding. Wound VAC in place on back. Central nervous system: Alert and oriented. No focal neurological deficits. Extremities: No clubbing cyanosis or edema. Skin: No  rashes, lesions or ulcers Psychiatry: Judgement and insight appear fair. Mood & affect appropriate.     Data Reviewed: I have personally reviewed following labs and imaging studies  CBC: Recent Labs  Lab 12/20/23 0345 12/21/23 0417 12/22/23 0422 12/23/23 0358 12/24/23 0328  WBC 8.9 7.2 8.5 8.9 9.3  NEUTROABS 8.0*  --   --   --   --   HGB 7.9* 8.1* 8.0* 8.2* 8.5*  HCT 23.6* 24.4* 24.6* 25.3* 26.4*  MCV 92.9 94.2 93.9 95.8 95.3  PLT 164 162 211 219 258    Basic Metabolic Panel: Recent Labs  Lab 12/18/23 0259 12/19/23 0241 12/20/23 0345 12/21/23 0417 12/22/23 0422 12/23/23 0358 12/24/23 0328  NA 130* 132* 134* 136 137 137 137  K 3.8 4.0 3.6 3.9 3.9 4.0 3.9  CL 100 99 100 106 104 107 104  CO2 19* 22 25 24 25  21* 25  GLUCOSE 102* 175* 104* 90 77 83 84  BUN 11 12 16 16 13 11 13   CREATININE 0.55* 0.51* 0.56* 0.57* 0.60* 0.65 0.67  CALCIUM  7.7* 7.9* 7.9* 7.9* 8.1* 8.1* 8.5*  MG 1.7 2.1  --  1.8 2.0  --  1.7  PHOS 2.2* 4.1  --  3.0  --   --   --     GFR: Estimated Creatinine Clearance: 88.9 mL/min (by C-G formula based on SCr of 0.67 mg/dL).  Liver Function Tests: No results for input(s):  "AST", "ALT", "ALKPHOS", "BILITOT", "PROT", "ALBUMIN " in the last 168 hours.  CBG: Recent Labs  Lab 12/23/23 1227 12/23/23 1718 12/23/23 2115 12/24/23 0614 12/24/23 0736  GLUCAP 151* 111* 97 67* 117*     Recent Results (from the past 240 hours)  Culture, blood (Routine X 2) w Reflex to ID Panel     Status: Abnormal   Collection Time: 12/16/23 10:49 AM   Specimen: BLOOD RIGHT HAND  Result Value Ref Range Status   Specimen Description BLOOD RIGHT HAND  Final   Special Requests   Final    BOTTLES DRAWN AEROBIC AND ANAEROBIC Blood Culture adequate volume   Culture  Setup Time   Final    GRAM NEGATIVE RODS IN BOTH AEROBIC AND ANAEROBIC BOTTLES CRITICAL RESULT CALLED TO, READ BACK BY AND VERIFIED WITH: PHARMD V BRYK 12/17/2023 @ 0131 BY AB Performed at [facility], [address], [place], [place] [address]    Culture PROTEUS MIRABILIS (A)  Final   Report Status 12/18/2023 FINAL  Final   Organism ID, Bacteria PROTEUS MIRABILIS  Final   Organism ID, Bacteria PROTEUS MIRABILIS  Final      Susceptibility   Proteus mirabilis - KIRBY BAUER*    CEFAZOLIN  INTERMEDIATE Intermediate    Proteus mirabilis - MIC*    AMPICILLIN <=2 SENSITIVE Sensitive     CEFEPIME  <=0.12 SENSITIVE Sensitive     CEFTAZIDIME <=1 SENSITIVE Sensitive     CEFTRIAXONE  <=0.25 SENSITIVE Sensitive     CIPROFLOXACIN <=0.25 SENSITIVE Sensitive     GENTAMICIN <=1 SENSITIVE Sensitive     IMIPENEM 2 SENSITIVE Sensitive     TRIMETH/SULFA <=20 SENSITIVE Sensitive     AMPICILLIN/SULBACTAM <=2 SENSITIVE Sensitive     PIP/TAZO <=4 SENSITIVE Sensitive ug/mL    * PROTEUS MIRABILIS    PROTEUS MIRABILIS  Blood Culture ID Panel (Reflexed)     Status: Abnormal   Collection Time: 12/16/23 10:49 AM  Result Value Ref Range Status   Enterococcus faecalis NOT DETECTED NOT DETECTED Final   Enterococcus Faecium NOT  DETECTED NOT DETECTED Final   Listeria monocytogenes NOT DETECTED NOT DETECTED Final   Staphylococcus species  NOT DETECTED NOT DETECTED Final   Staphylococcus aureus (BCID) NOT DETECTED NOT DETECTED Final   Staphylococcus epidermidis NOT DETECTED NOT DETECTED Final   Staphylococcus lugdunensis NOT DETECTED NOT DETECTED Final   Streptococcus species NOT DETECTED NOT DETECTED Final   Streptococcus agalactiae NOT DETECTED NOT DETECTED Final   Streptococcus pneumoniae NOT DETECTED NOT DETECTED Final   Streptococcus pyogenes NOT DETECTED NOT DETECTED Final   A.calcoaceticus-baumannii NOT DETECTED NOT DETECTED Final   Bacteroides fragilis NOT DETECTED NOT DETECTED Final   Enterobacterales DETECTED (A) NOT DETECTED Final    Comment: Enterobacterales represent a large order of gram negative bacteria, not a single organism. CRITICAL RESULT CALLED TO, READ BACK BY AND VERIFIED WITH: PHARMD V BRYK 12/17/2023 @ 0131 BY AB    Enterobacter cloacae complex NOT DETECTED NOT DETECTED Final   Escherichia coli NOT DETECTED NOT DETECTED Final   Klebsiella aerogenes NOT DETECTED NOT DETECTED Final   Klebsiella oxytoca NOT DETECTED NOT DETECTED Final   Klebsiella pneumoniae NOT DETECTED NOT DETECTED Final   Proteus species DETECTED (A) NOT DETECTED Final    Comment: CRITICAL RESULT CALLED TO, READ BACK BY AND VERIFIED WITH: PHARMD V BRYK 12/17/2023 @ 0131 BY AB    Salmonella species NOT DETECTED NOT DETECTED Final   Serratia marcescens NOT DETECTED NOT DETECTED Final   Haemophilus influenzae NOT DETECTED NOT DETECTED Final   Neisseria meningitidis NOT DETECTED NOT DETECTED Final   Pseudomonas aeruginosa NOT DETECTED NOT DETECTED Final   Stenotrophomonas maltophilia NOT DETECTED NOT DETECTED Final   Candida albicans NOT DETECTED NOT DETECTED Final   Candida auris NOT DETECTED NOT DETECTED Final   Candida glabrata NOT DETECTED NOT DETECTED Final   Candida krusei NOT DETECTED NOT DETECTED Final   Candida parapsilosis NOT DETECTED NOT DETECTED Final   Candida tropicalis NOT DETECTED NOT DETECTED Final   Cryptococcus  neoformans/gattii NOT DETECTED NOT DETECTED Final   CTX-M ESBL NOT DETECTED NOT DETECTED Final   Carbapenem resistance IMP NOT DETECTED NOT DETECTED Final   Carbapenem resistance KPC NOT DETECTED NOT DETECTED Final   Carbapenem resistance NDM NOT DETECTED NOT DETECTED Final   Carbapenem resist OXA 48 LIKE NOT DETECTED NOT DETECTED Final   Carbapenem resistance VIM NOT DETECTED NOT DETECTED Final    Comment: Performed at [facility], [address] [place], [place] [address]  Aerobic/Anaerobic Culture w Gram Stain (surgical/deep wound)     Status: None   Collection Time: 12/16/23 11:11 AM   Specimen: Wound  Result Value Ref Range Status   Specimen Description WOUND  Final   Special Requests NONE  Final   Gram Stain   Final    RARE SQUAMOUS EPITHELIAL CELLS PRESENT RARE WBC PRESENT, PREDOMINANTLY PMN RARE GRAM NEGATIVE RODS    Culture   Final    FEW KLEBSIELLA AEROGENES FEW PROTEUS MIRABILIS FEW DIPHTHEROIDS(CORYNEBACTERIUM SPECIES) Standardized susceptibility testing for this organism is not available. NO ANAEROBES ISOLATED Performed at [facility], [address], [place], [place] [address]    Report Status 12/21/2023 FINAL  Final   Organism ID, Bacteria KLEBSIELLA AEROGENES  Final   Organism ID, Bacteria PROTEUS MIRABILIS  Final      Susceptibility   Klebsiella aerogenes - MIC*    CEFEPIME  <=0.12 SENSITIVE Sensitive     CEFTAZIDIME <=1 SENSITIVE Sensitive     CEFTRIAXONE  <=0.25 SENSITIVE Sensitive  CIPROFLOXACIN <=0.25 SENSITIVE Sensitive     GENTAMICIN <=1 SENSITIVE Sensitive     IMIPENEM 1 SENSITIVE Sensitive     TRIMETH/SULFA <=20 SENSITIVE Sensitive     PIP/TAZO <=4 SENSITIVE Sensitive ug/mL    * FEW KLEBSIELLA AEROGENES   Proteus mirabilis - MIC*    AMPICILLIN <=2 SENSITIVE Sensitive     CEFEPIME  <=0.12 SENSITIVE Sensitive     CEFTAZIDIME <=1 SENSITIVE Sensitive     CEFTRIAXONE  <=0.25 SENSITIVE Sensitive     CIPROFLOXACIN <=0.25 SENSITIVE  Sensitive     GENTAMICIN <=1 SENSITIVE Sensitive     IMIPENEM 2 SENSITIVE Sensitive     TRIMETH/SULFA <=20 SENSITIVE Sensitive     AMPICILLIN/SULBACTAM <=2 SENSITIVE Sensitive     PIP/TAZO <=4 SENSITIVE Sensitive ug/mL    * FEW PROTEUS MIRABILIS  Culture, blood (Routine X 2) w Reflex to ID Panel     Status: None   Collection Time: 12/16/23  1:03 PM   Specimen: BLOOD LEFT ARM  Result Value Ref Range Status   Specimen Description BLOOD LEFT ARM  Final   Special Requests   Final    BOTTLES DRAWN AEROBIC AND ANAEROBIC Blood Culture adequate volume   Culture   Final    NO GROWTH 5 DAYS Performed at [facility], [address] [place], [place] [address]    Report Status 12/21/2023 FINAL  Final         Radiology Studies: No results found.      Scheduled Meds:  acetaminophen   650 mg Oral Q8H   vitamin C   1,000 mg Oral Daily   Chlorhexidine  Gluconate Cloth  6 each Topical Daily   cyanocobalamin   1,000 mcg Oral Daily   enoxaparin  (LOVENOX ) injection  50 mg Subcutaneous Q24H   feeding supplement  237 mL Oral BID BM   finasteride   5 mg Oral Daily   folic acid   1 mg Oral Daily   gabapentin   300 mg Oral TID   insulin  aspart  0-5 Units Subcutaneous QHS   insulin  aspart  0-9 Units Subcutaneous TID WC   levothyroxine   112 mcg Oral Q0600   lidocaine   2 patch Transdermal Q24H   loratadine   10 mg Oral Daily   melatonin  3 mg Oral QHS   midodrine   10 mg Oral TID WC   modafinil   200 mg Oral Daily   oxyCODONE   20 mg Oral Q12H   pantoprazole   40 mg Oral Daily   polyethylene glycol  17 g Oral Daily   predniSONE   15 mg Oral Q breakfast   Followed by   [name] ON 12/27/2023] predniSONE   10 mg Oral Q breakfast   Followed by   [name] ON 01/03/2024] predniSONE   5 mg Oral Q breakfast   Followed by   [name] ON 01/10/2024] predniSONE   2.5 mg Oral Q breakfast   rosuvastatin   5 mg Oral Daily   senna  1 tablet Oral BID   sodium chloride  flush  3 mL Intravenous Q12H   zinc  sulfate (50mg   elemental zinc )  220 mg Oral Daily   Continuous Infusions:  ceFEPime  (MAXIPIME ) IV 2 g (12/24/23 0534)   magnesium  sulfate bolus IVPB 2 g (12/24/23 0934)     LOS: 19 days    Time spent: 40 minutes    Hilda Lovings, MD [facility]   To contact the attending provider between 7A-7P or the covering provider during after hours 7P-7A, please log into the web site [email or web address] and access using universal [place]  password for that web site. If you do not have the password, please call the hospital operator.  12/24/2023, 10:03 AM

## 2023-12-25 ENCOUNTER — Other Ambulatory Visit: Payer: Self-pay

## 2023-12-25 ENCOUNTER — Inpatient Hospital Stay (HOSPITAL_COMMUNITY): Admitting: Anesthesiology

## 2023-12-25 ENCOUNTER — Encounter (HOSPITAL_COMMUNITY): Admission: AD | Disposition: E | Payer: Self-pay | Source: Ambulatory Visit | Attending: Neurological Surgery

## 2023-12-25 ENCOUNTER — Encounter (HOSPITAL_COMMUNITY): Payer: Self-pay | Admitting: Neurological Surgery

## 2023-12-25 DIAGNOSIS — T8149XA Infection following a procedure, other surgical site, initial encounter: Secondary | ICD-10-CM

## 2023-12-25 DIAGNOSIS — R7881 Bacteremia: Secondary | ICD-10-CM | POA: Diagnosis not present

## 2023-12-25 DIAGNOSIS — T8131XA Disruption of external operation (surgical) wound, not elsewhere classified, initial encounter: Secondary | ICD-10-CM | POA: Diagnosis not present

## 2023-12-25 DIAGNOSIS — M5440 Lumbago with sciatica, unspecified side: Secondary | ICD-10-CM | POA: Diagnosis not present

## 2023-12-25 DIAGNOSIS — E039 Hypothyroidism, unspecified: Secondary | ICD-10-CM

## 2023-12-25 DIAGNOSIS — I10 Essential (primary) hypertension: Secondary | ICD-10-CM

## 2023-12-25 DIAGNOSIS — K219 Gastro-esophageal reflux disease without esophagitis: Secondary | ICD-10-CM | POA: Diagnosis not present

## 2023-12-25 DIAGNOSIS — G9341 Metabolic encephalopathy: Secondary | ICD-10-CM | POA: Diagnosis not present

## 2023-12-25 DIAGNOSIS — R739 Hyperglycemia, unspecified: Secondary | ICD-10-CM

## 2023-12-25 DIAGNOSIS — Z87891 Personal history of nicotine dependence: Secondary | ICD-10-CM

## 2023-12-25 DIAGNOSIS — D62 Acute posthemorrhagic anemia: Secondary | ICD-10-CM | POA: Diagnosis not present

## 2023-12-25 DIAGNOSIS — Z981 Arthrodesis status: Secondary | ICD-10-CM | POA: Diagnosis not present

## 2023-12-25 DIAGNOSIS — B964 Proteus (mirabilis) (morganii) as the cause of diseases classified elsewhere: Secondary | ICD-10-CM | POA: Diagnosis not present

## 2023-12-25 HISTORY — PX: LUMBAR WOUND DEBRIDEMENT: SHX1988

## 2023-12-25 LAB — CBC WITH DIFFERENTIAL/PLATELET
Abs Immature Granulocytes: 0.17 10*3/uL — ABNORMAL HIGH (ref 0.00–0.07)
Basophils Absolute: 0 10*3/uL (ref 0.0–0.1)
Basophils Relative: 0 %
Eosinophils Absolute: 0.2 10*3/uL (ref 0.0–0.5)
Eosinophils Relative: 2 %
HCT: 26.4 % — ABNORMAL LOW (ref 39.0–52.0)
Hemoglobin: 8.3 g/dL — ABNORMAL LOW (ref 13.0–17.0)
Immature Granulocytes: 2 %
Lymphocytes Relative: 9 %
Lymphs Abs: 0.9 10*3/uL (ref 0.7–4.0)
MCH: 30.5 pg (ref 26.0–34.0)
MCHC: 31.4 g/dL (ref 30.0–36.0)
MCV: 97.1 fL (ref 80.0–100.0)
Monocytes Absolute: 0.7 10*3/uL (ref 0.1–1.0)
Monocytes Relative: 7 %
Neutro Abs: 8.1 10*3/uL — ABNORMAL HIGH (ref 1.7–7.7)
Neutrophils Relative %: 80 %
Platelets: 254 10*3/uL (ref 150–400)
RBC: 2.72 MIL/uL — ABNORMAL LOW (ref 4.22–5.81)
RDW: 14.1 % (ref 11.5–15.5)
WBC: 10.1 10*3/uL (ref 4.0–10.5)
nRBC: 0 % (ref 0.0–0.2)

## 2023-12-25 LAB — MAGNESIUM: Magnesium: 1.9 mg/dL (ref 1.7–2.4)

## 2023-12-25 LAB — BASIC METABOLIC PANEL WITH GFR
Anion gap: 11 (ref 5–15)
BUN: 13 mg/dL (ref 8–23)
CO2: 22 mmol/L (ref 22–32)
Calcium: 8.3 mg/dL — ABNORMAL LOW (ref 8.9–10.3)
Chloride: 103 mmol/L (ref 98–111)
Creatinine, Ser: 0.62 mg/dL (ref 0.61–1.24)
GFR, Estimated: >60 mL/min (ref >60)
Glucose, Bld: 82 mg/dL (ref 70–99)
Potassium: 4 mmol/L (ref 3.5–5.1)
Sodium: 136 mmol/L (ref 135–145)

## 2023-12-25 LAB — GLUCOSE, CAPILLARY
Glucose-Capillary: 76 mg/dL (ref 70–99)
Glucose-Capillary: 107 mg/dL — ABNORMAL HIGH (ref 70–99)
Glucose-Capillary: 111 mg/dL — ABNORMAL HIGH (ref 70–99)
Glucose-Capillary: 173 mg/dL — ABNORMAL HIGH (ref 70–99)

## 2023-12-25 SURGERY — LUMBAR WOUND DEBRIDEMENT
Anesthesia: General

## 2023-12-25 MED ORDER — FENTANYL CITRATE (PF) 100 MCG/2ML IJ SOLN
INTRAMUSCULAR | Status: AC
  Filled 2023-12-25: qty 2

## 2023-12-25 MED ORDER — VASOPRESSIN 20 UNIT/ML IV SOLN
INTRAVENOUS | Status: AC
  Filled 2023-12-25: qty 1

## 2023-12-25 MED ORDER — SODIUM CHLORIDE 0.9% FLUSH
3.0000 mL | Freq: Two times a day (BID) | INTRAVENOUS | Status: DC
  Administered 2023-12-26: 3 mL via INTRAVENOUS
  Administered 2023-12-26 – 2023-12-30 (×5): 10 mL via INTRAVENOUS
  Administered 2023-12-30: 3 mL via INTRAVENOUS
  Administered 2023-12-31 – 2024-01-02 (×3): 10 mL via INTRAVENOUS
  Administered 2024-01-02: 3 mL via INTRAVENOUS
  Administered 2024-01-03: 10 mL via INTRAVENOUS
  Administered 2024-01-03: 4 mL via INTRAVENOUS
  Administered 2024-01-04 – 2024-01-05 (×2): 10 mL via INTRAVENOUS
  Administered 2024-01-05: 3 mL via INTRAVENOUS
  Administered 2024-01-06 – 2024-01-07 (×3): 10 mL via INTRAVENOUS
  Administered 2024-01-07: 3 mL via INTRAVENOUS
  Administered 2024-01-08: 10 mL via INTRAVENOUS
  Administered 2024-01-08 – 2024-01-09 (×2): 3 mL via INTRAVENOUS

## 2023-12-25 MED ORDER — ROCURONIUM BROMIDE 10 MG/ML (PF) SYRINGE
PREFILLED_SYRINGE | INTRAVENOUS | Status: DC | PRN
  Administered 2023-12-25: 50 mg via INTRAVENOUS

## 2023-12-25 MED ORDER — HYDROMORPHONE HCL 1 MG/ML IJ SOLN
0.2500 mg | INTRAMUSCULAR | Status: DC | PRN
  Administered 2023-12-25 (×2): 0.5 mg via INTRAVENOUS

## 2023-12-25 MED ORDER — 0.9 % SODIUM CHLORIDE (POUR BTL) OPTIME
TOPICAL | Status: DC | PRN
  Administered 2023-12-25: 1000 mL

## 2023-12-25 MED ORDER — ONDANSETRON HCL 4 MG/2ML IJ SOLN: 4.0000 mg | Freq: Four times a day (QID) | INTRAMUSCULAR | Status: DC | PRN

## 2023-12-25 MED ORDER — FENTANYL CITRATE (PF) 250 MCG/5ML IJ SOLN
INTRAMUSCULAR | Status: DC | PRN
  Administered 2023-12-25: 100 ug via INTRAVENOUS
  Administered 2023-12-25 (×3): 50 ug via INTRAVENOUS

## 2023-12-25 MED ORDER — PROPOFOL 10 MG/ML IV BOLUS
INTRAVENOUS | Status: DC | PRN
  Administered 2023-12-25: 100 mg via INTRAVENOUS

## 2023-12-25 MED ORDER — CHLORHEXIDINE GLUCONATE 0.12 % MT SOLN
15.0000 mL | Freq: Once | OROMUCOSAL | Status: AC
  Administered 2023-12-25: 15 mL via OROMUCOSAL

## 2023-12-25 MED ORDER — PHENYLEPHRINE HCL-NACL 20-0.9 MG/250ML-% IV SOLN
INTRAVENOUS | Status: DC | PRN
  Administered 2023-12-25: 25 ug/min via INTRAVENOUS

## 2023-12-25 MED ORDER — SODIUM CHLORIDE 0.9% FLUSH: 3.0000 mL | INTRAVENOUS | Status: DC | PRN

## 2023-12-25 MED ORDER — OXYCODONE HCL 5 MG PO TABS: 5.0000 mg | ORAL_TABLET | Freq: Once | ORAL | Status: DC | PRN

## 2023-12-25 MED ORDER — FENTANYL CITRATE (PF) 250 MCG/5ML IJ SOLN
INTRAMUSCULAR | Status: AC
  Filled 2023-12-25: qty 5

## 2023-12-25 MED ORDER — ACETAMINOPHEN 325 MG PO TABS: 650.0000 mg | ORAL_TABLET | ORAL | Status: DC | PRN

## 2023-12-25 MED ORDER — FENTANYL CITRATE (PF) 100 MCG/2ML IJ SOLN
25.0000 ug | INTRAMUSCULAR | Status: DC | PRN
  Administered 2023-12-25 (×3): 50 ug via INTRAVENOUS

## 2023-12-25 MED ORDER — PHENYLEPHRINE HCL-NACL 20-0.9 MG/250ML-% IV SOLN
INTRAVENOUS | Status: AC
  Filled 2023-12-25: qty 250

## 2023-12-25 MED ORDER — LACTATED RINGERS IV SOLN: INTRAVENOUS | Status: DC | PRN

## 2023-12-25 MED ORDER — BUPIVACAINE HCL (PF) 0.25 % IJ SOLN
INTRAMUSCULAR | Status: AC
  Filled 2023-12-25: qty 30

## 2023-12-25 MED ORDER — SURGIRINSE WOUND IRRIGATION SYSTEM - OPTIME
TOPICAL | Status: DC | PRN
  Administered 2023-12-25: 450 mL via TOPICAL

## 2023-12-25 MED ORDER — PROPOFOL 10 MG/ML IV BOLUS
INTRAVENOUS | Status: AC
  Filled 2023-12-25: qty 20

## 2023-12-25 MED ORDER — LIDOCAINE 2% (20 MG/ML) 5 ML SYRINGE
INTRAMUSCULAR | Status: DC | PRN
  Administered 2023-12-25: 100 mg via INTRAVENOUS

## 2023-12-25 MED ORDER — ONDANSETRON HCL 4 MG PO TABS: 4.0000 mg | ORAL_TABLET | Freq: Four times a day (QID) | ORAL | Status: DC | PRN

## 2023-12-25 MED ORDER — ACETAMINOPHEN 650 MG RE SUPP: 650.0000 mg | RECTAL | Status: DC | PRN

## 2023-12-25 MED ORDER — ONDANSETRON HCL 4 MG/2ML IJ SOLN
INTRAMUSCULAR | Status: DC | PRN
  Administered 2023-12-25: 4 mg via INTRAVENOUS

## 2023-12-25 MED ORDER — ORAL CARE MOUTH RINSE: 15.0000 mL | Freq: Once | OROMUCOSAL | Status: AC

## 2023-12-25 MED ORDER — SODIUM CHLORIDE 0.9 % IV SOLN: 250.0000 mL | INTRAVENOUS | Status: AC

## 2023-12-25 MED ORDER — DEXAMETHASONE SODIUM PHOSPHATE 10 MG/ML IJ SOLN
INTRAMUSCULAR | Status: DC | PRN
  Administered 2023-12-25: 5 mg via INTRAVENOUS

## 2023-12-25 MED ORDER — SUGAMMADEX SODIUM 200 MG/2ML IV SOLN
INTRAVENOUS | Status: DC | PRN
  Administered 2023-12-25: 200 mg via INTRAVENOUS

## 2023-12-25 MED ORDER — VASHE WOUND IRRIGATION OPTIME
TOPICAL | Status: DC | PRN
  Administered 2023-12-25: 34 [oz_av]

## 2023-12-25 MED ORDER — LACTATED RINGERS IV SOLN: INTRAVENOUS | Status: DC

## 2023-12-25 MED ORDER — THROMBIN 5000 UNITS EX KIT
PACK | CUTANEOUS | Status: AC
  Filled 2023-12-25: qty 1

## 2023-12-25 MED ORDER — OXYCODONE HCL 5 MG/5ML PO SOLN: 5.0000 mg | Freq: Once | ORAL | Status: DC | PRN

## 2023-12-25 MED ORDER — HYDROMORPHONE HCL 1 MG/ML IJ SOLN
INTRAMUSCULAR | Status: AC
  Filled 2023-12-25: qty 1

## 2023-12-25 MED ORDER — SODIUM CHLORIDE 0.9% FLUSH
3.0000 mL | Freq: Two times a day (BID) | INTRAVENOUS | Status: DC
  Administered 2023-12-26 – 2024-01-16 (×35): 3 mL via INTRAVENOUS

## 2023-12-25 MED ORDER — SODIUM CHLORIDE 0.9% FLUSH
3.0000 mL | INTRAVENOUS | Status: DC | PRN
  Administered 2024-01-09: 3 mL via INTRAVENOUS

## 2023-12-25 SURGICAL SUPPLY — 47 items
BAG COUNTER SPONGE SURGICOUNT (BAG) ×1 IMPLANT
BENZOIN TINCTURE PRP APPL 2/3 (GAUZE/BANDAGES/DRESSINGS) ×1 IMPLANT
CANISTER SUCTION 3000ML PPV (SUCTIONS) ×1 IMPLANT
CANISTER WOUND CARE 500ML ATS (WOUND CARE) IMPLANT
CLEANSER WND VASHE INSTL 34OZ (WOUND CARE) IMPLANT
DRAPE LAPAROTOMY 100X72X124 (DRAPES) ×1 IMPLANT
DRESSING PEEL AND PLAC PRVNA20 (GAUZE/BANDAGES/DRESSINGS) IMPLANT
DRSG CUTIMED SORBACT 7X9 (GAUZE/BANDAGES/DRESSINGS) IMPLANT
DURAPREP 26ML APPLICATOR (WOUND CARE) IMPLANT
DURAPREP 6ML APPLICATOR 50/CS (WOUND CARE) IMPLANT
ELECTRODE REM PT RTRN 9FT ADLT (ELECTROSURGICAL) ×1 IMPLANT
EVACUATOR 1/8 PVC DRAIN (DRAIN) IMPLANT
GAUZE 4X4 16PLY ~~LOC~~+RFID DBL (SPONGE) IMPLANT
GLOVE BIO SURGEON STRL SZ7 (GLOVE) IMPLANT
GLOVE BIO SURGEON STRL SZ8 (GLOVE) ×1 IMPLANT
GLOVE BIOGEL PI IND STRL 7.0 (GLOVE) IMPLANT
GOWN STRL REUS W/ TWL LRG LVL3 (GOWN DISPOSABLE) IMPLANT
GOWN STRL REUS W/ TWL XL LVL3 (GOWN DISPOSABLE) IMPLANT
GOWN STRL REUS W/TWL 2XL LVL3 (GOWN DISPOSABLE) ×1 IMPLANT
GRAFT MYRIAD 7X10 (Graft) IMPLANT
KIT BASIN OR (CUSTOM PROCEDURE TRAY) ×1 IMPLANT
KIT TURNOVER KIT B (KITS) ×1 IMPLANT
NDL HYPO 18GX1.5 BLUNT FILL (NEEDLE) IMPLANT
NDL HYPO 25X1 1.5 SAFETY (NEEDLE) ×1 IMPLANT
NDL SPNL 20GX3.5 QUINCKE YW (NEEDLE) IMPLANT
NEEDLE HYPO 18GX1.5 BLUNT FILL (NEEDLE) IMPLANT
NEEDLE HYPO 25X1 1.5 SAFETY (NEEDLE) IMPLANT
NEEDLE SPNL 20GX3.5 QUINCKE YW (NEEDLE) IMPLANT
NS IRRIG 1000ML POUR BTL (IV SOLUTION) ×1 IMPLANT
PACK LAMINECTOMY NEURO (CUSTOM PROCEDURE TRAY) ×1 IMPLANT
PAD ARMBOARD POSITIONER FOAM (MISCELLANEOUS) ×3 IMPLANT
PATTIES SURGICAL .5 X.5 (GAUZE/BANDAGES/DRESSINGS) ×1 IMPLANT
PATTIES SURGICAL 1X1 (DISPOSABLE) ×1 IMPLANT
POWDER MORCELSS FINE 2000MG (Miscellaneous) IMPLANT
SOLUTION IRRIG SURGIPHOR (IV SOLUTION) IMPLANT
STRIP CLOSURE SKIN 1/2X4 (GAUZE/BANDAGES/DRESSINGS) ×1 IMPLANT
SUT ETHILON 3 0 PS 1 (SUTURE) IMPLANT
SUT VIC AB 0 CT1 18XCR BRD8 (SUTURE) ×1 IMPLANT
SUT VIC AB 2-0 CP2 18 (SUTURE) ×1 IMPLANT
SUT VIC AB 3-0 SH 8-18 (SUTURE) ×1 IMPLANT
SWAB COLLECTION DEVICE MRSA (MISCELLANEOUS) ×1 IMPLANT
SWAB CULTURE ESWAB REG 1ML (MISCELLANEOUS) ×1 IMPLANT
SYR 30ML SLIP (SYRINGE) ×1 IMPLANT
SYR 3ML LL SCALE MARK (SYRINGE) IMPLANT
TOWEL GREEN STERILE (TOWEL DISPOSABLE) ×1 IMPLANT
TOWEL GREEN STERILE FF (TOWEL DISPOSABLE) ×1 IMPLANT
WATER STERILE IRR 1000ML POUR (IV SOLUTION) ×1 IMPLANT

## 2023-12-25 NOTE — Progress Notes (Signed)
 PT Cancellation Note  Patient Details Name: Norman Lambert MRN: [id number] DOB: 05-10-1951   Cancelled Treatment:    Reason Eval/Treat Not Completed: (P) Patient at procedure or test/unavailable;Pain limiting ability to participate, pt declining mobility due to pain in AM, RN notified and giving PRN pain meds, on return pt off floor for procedure. Will check back as schedule allows to continue with PT POC.  Beverly Buckler. PTA Acute Rehabilitation Services Office: [phone]    Agapito Horseman 12/25/2023, 2:44 PM

## 2023-12-25 NOTE — Progress Notes (Signed)
 Consult/PROGRESS NOTE    Norman Lambert  ZOX:[id number] DOB: 22-Nov-1950 DOA: 12/05/2023 PCP: Patient, No Pcp Per    No chief complaint on file.   Brief Narrative:  73 year old male patient who was admitted on 4/25 for reduction and internal fixation of previous fusions involving L2-S1. Postoperative course initially seemed unremarkable pain had improved still not able to walk as of 4/29 on 4/30 began to have new increased pain involving the left foot, on 5/1 early in the morning continued to report 10 out of 10 pain seemingly not responding to as needed narcotics. Imaging of the back was unremarkable, he continued to have drain VAC in place in the evening hours of 5/2 began to have borderline hypotension with tachycardia 70 have shakes and tremors in the evening, temperature spike to 103 was started on IV cefepime  and vancomycin  with concern about sepsis white blood cell count elevated to 17,000 there was significant wound erythema with serosanguineous foul-smelling drainage from the surgical site. Initially internal medicine was consulted but after administering about a liter of crystalloid the patient remained hypotensive in the 70s so critical care was asked to evaluate for possible transfer to the intensive care. Levophed  subsequently weaned off and patient transferred to the floor, TRH consulted to help with chronic medical issues.  Significant Hospital Events: Including procedures, antibiotic start and stop dates in addition to other pertinent events   4/25 Extensive L2-S1 lumbar surgery 4/30 New onset pain  5/1 Ongoing pain borderline blood pressure 5/2 Pain, leukocytosis fever, 5/3 Hypotension, not responding to crystalloid started on broad-spectrum antibiotics wound assessed by surgical team culture sent imaging pending ICU transfer. CT imaging of lumbar spine was negative for definitive abscess there were stable fractures at L3 and L4 with possible new fracture involving the transverse  process of L2 and L3 on the left postoperative changes noted.CT angio of chest was negative for pulmonary emboli had bibasilar airspace disease the right was a little worse than the left consistent with possible pneumonia there was aortic aneurysmal dilation of the ascending aorta measuring at 4.5 cm 5/4 Weaned off norepinephrine , Blood cultures from 1 out of 2 samples growing gram-negative rods with BC ID showing both proteus and Enterobacterales, MRSA PCR pending. Narrowed to ceftriaxone . 5/5 Remains on low dose NE. Febrile. BCx growing proteus.  5/6 Wound Cx with proteus, klebsiella aerogenes. Abx re-broadened to cefepime . 5/8 Required low dose norepi overnight but off this AM. His wound vac was changed this AM and has been having some pain 5/9 no issues overnight, denies any acute pain this a.m.  Assessment & Plan:   Principal Problem:   Back pain Active Problems:   S/P lumbar fusion   Septic shock (HCC)   Bacteremia due to Proteus species   Pressure injury of skin   Paroxysmal A-fib (HCC)   Acute postoperative anemia due to expected blood loss   Benign prostatic hyperplasia   Gastroesophageal reflux disease   Acute metabolic encephalopathy  #1 septic shock secondary to Proteus bacteremia with assumed source as the surgical site/possible pneumonia -Surgical wound cultures growing Klebsiella aerogenes and Proteus Mirabilis - Blood cultures from 12/16/2023 with Proteus Mirabilis. - It is noted that neurosurgery noticed wounds of dehiscence over surgical site 5/8, wound VAC replaced and attached external VAC system instead of the Prevena and plan is to reevaluate the wound on 12/25/2023 with possible return to the OR for debridement and primary closure if needed. -Patient reassessed by neurosurgery and patient for irrigation and debridement of the  wound with primary closure per neurosurgery/primary team today. - Continue current antibiotics of IV cefepime  as recommended per ID. - Continue  midodrine . - Continue steroid taper. - ID following, antibiotic recommendations and coverage as per ID. - Wound VAC in wound management per neurosurgery.  2.  Status post redo of laminectomy L3-4 with posterior fixation L2-S1 4/25 -PT/OT. -Pain management per primary team, neurosurgery. -Per primary team, neurosurgery.  3.  Paroxysmal A-fib with RVR CHA2DS2VASC score 3. -Patient noted to be back in normal sinus rhythm. - Likely triggered by acute infection. - Patient asymptomatic. - 2D echo with a EF of 55 to 60%,NWMA, moderate eccentric LVH of inferior segment.  Normal right ventricular systolic function.  Large epicardial fat pad.  Trivial MR/AR mild aortic valve stenosis. - Currently on Lovenox , will defer anticoagulation plan to primary team, neurosurgery in the immediate postop period.  4.  Hyperlipidemia - Statin.  5.  Hypertension -BP stable. - Continue midodrine .  - Continue to hold home regimen of antihypertensive medications.  6.  Peripheral vascular disease - Continue statin.  7.  Acute metabolic encephalopathy secondary to sepsis - Slowly improving clinically however some intermittent bouts of confusion.  - Delirium precautions. - Ensure adequate sleep-wake cycle. - Continue Provigil . - Continue nutrition and bowel regimen.  8.  Postop acute blood loss anemia -Hemoglobin currently stable at 8.3. - Follow H&H. - Transfusion threshold hemoglobin < 7.  9.  BPH -Continue Flomax , finasteride .  10.  GERD - PPI.  11.  Hypothyroidism - Synthroid .  12.  Hyperglycemia in the setting of steroids -CBG noted at 76 this morning. - SSI.  13.  Pressure injuries, not POA Pressure Injury 12/16/23 Buttocks Stage 2 -  Partial thickness loss of dermis presenting as a shallow open injury with a red, pink wound bed without slough. (Active)  12/16/23 1300  Location: Buttocks  Location Orientation:   Staging: Stage 2 -  Partial thickness loss of dermis presenting as a  shallow open injury with a red, pink wound bed without slough.  Wound Description (Comments):   Present on Admission:      Pressure Injury 12/16/23 Toe (Comment  which one) Anterior;Left Deep Tissue Pressure Injury - Purple or maroon localized area of discolored intact skin or blood-filled blister due to damage of underlying soft tissue from pressure and/or shear. (Active)  12/16/23 1300  Location: Toe (Comment  which one)  Location Orientation: Anterior;Left  Staging: Deep Tissue Pressure Injury - Purple or maroon localized area of discolored intact skin or blood-filled blister due to damage of underlying soft tissue from pressure and/or shear.  Wound Description (Comments):   Present on Admission:      Pressure Injury 12/20/23 Heel Right Deep Tissue Pressure Injury - Purple or maroon localized area of discolored intact skin or blood-filled blister due to damage of underlying soft tissue from pressure and/or shear. black (Active)  12/20/23 2000  Location: Heel  Location Orientation: Right  Staging: Deep Tissue Pressure Injury - Purple or maroon localized area of discolored intact skin or blood-filled blister due to damage of underlying soft tissue from pressure and/or shear.  Wound Description (Comments): black  Present on Admission: No       DVT prophylaxis: Lovenox  Code Status: Full Family Communication: Updated patient and wife at bedside. Disposition: Per primary team  Status is: Inpatient Remains inpatient appropriate because: Severity of illness   Consultants:  TRH: Dr. Osborne Blazer 12/16/2023  PCCM: Dr. Diania Fortes 12/16/2023 ID: Dr. Zelda Hickman 12/18/2023  Procedures:  Redo  decompressive laminectomy L3-4 for recurrent stenosis with removal of epidural bone graft material/posterior fixation L2-S1 inclusive using Alphatec cortical pedicle screw/intertransverse arthrodesis L2-S1 inclusive using morselized autograft and allograft/exploration of fusion L3-4 with removal of instrumentation per  Neurosurgery: Dr. Rochelle Chu 12/08/2023 2D echo 12/18/2023  Antimicrobials:  Anti-infectives (From admission, onward)    Start     Dose/Rate Route Frequency Ordered Stop   12/19/23 1300  [MAR Hold]  ceFEPIme  (MAXIPIME ) 2 g in sodium chloride  0.9 % 100 mL IVPB        (MAR Hold since Mon 12/25/2023 at 1313.Hold Reason: Transfer to a Procedural area)   2 g 200 mL/hr over 30 Minutes Intravenous Every 8 hours 12/19/23 1116     12/17/23 1000  vancomycin  (VANCOREADY) IVPB 1500 mg/300 mL  Status:  Discontinued       Placed in "Followed by" Linked Group   1,500 mg 150 mL/hr over 120 Minutes Intravenous Every 24 hours 12/16/23 0920 12/17/23 1009   12/17/23 1000  cefTRIAXone  (ROCEPHIN ) 2 g in sodium chloride  0.9 % 100 mL IVPB  Status:  Discontinued        2 g 200 mL/hr over 30 Minutes Intravenous Every 24 hours 12/17/23 0159 12/19/23 1116   12/16/23 1030  vancomycin  (VANCOREADY) IVPB 2000 mg/400 mL       Placed in "Followed by" Linked Group   2,000 mg 200 mL/hr over 120 Minutes Intravenous  Once 12/16/23 0920 12/16/23 1359   12/16/23 1015  ceFEPIme  (MAXIPIME ) 2 g in sodium chloride  0.9 % 100 mL IVPB  Status:  Discontinued        2 g 200 mL/hr over 30 Minutes Intravenous Every 8 hours 12/16/23 0920 12/17/23 0158   12/08/23 1900  ceFAZolin  (ANCEF ) IVPB 2g/100 mL premix        2 g 200 mL/hr over 30 Minutes Intravenous Every 8 hours 12/08/23 1721 12/09/23 0452   12/08/23 1015  ceFAZolin  (ANCEF ) IVPB 2g/100 mL premix        2 g 200 mL/hr over 30 Minutes Intravenous On call to O.R. 12/08/23 1000 12/08/23 1105   12/08/23 0952  ceFAZolin  (ANCEF ) 2-4 GM/100ML-% IVPB       Note to Pharmacy: Noelle Batten M: cabinet override      12/08/23 0952 12/08/23 1105         Subjective: Patient lying in bed.  Denies any chest pain or shortness of breath.  No abdominal pain.  Wife at bedside.  Patient states was seen by neurosurgery this morning and to go to the OR later on today.   Objective: Vitals:   12/25/23  0836 12/25/23 1139 12/25/23 1141 12/25/23 1316  BP:   (!) 136/59 (!) 146/62  Pulse: 67 66 70 66  Resp: (!) 9 18 16 17   Temp:   97.9 F (36.6 C) 98.2 F (36.8 C)  TempSrc:   Oral Oral  SpO2: 99% 98% 98% 97%  Weight:    92.1 kg  Height:    6' (1.829 m)    Intake/Output Summary (Last 24 hours) at 12/25/2023 1451 Last data filed at 12/25/2023 1257 Gross per 24 hour  Intake 540 ml  Output 1510 ml  Net -970 ml   Filed Weights   12/20/23 2106 12/25/23 0500 12/25/23 1316  Weight: 92.6 kg 92.1 kg 92.1 kg    Examination:  General exam: Appears calm and comfortable  Respiratory system: Lungs clear to auscultation bilaterally.  No wheezes, no crackles, no rhonchi.  Fair air movement.  Speaking in  full sentences. Cardiovascular system: Regular rate rhythm no murmurs rubs or gallops.  No JVD.  No pitting lower extremity edema. Gastrointestinal system: Abdomen is soft, nontender, nondistended, positive bowel sounds.  No rebound.  No guarding.  Wound VAC noted on back with dressing in place.  Central nervous system: Alert and oriented. No focal neurological deficits. Extremities: No clubbing cyanosis or edema. Skin: No rashes, lesions or ulcers Psychiatry: Judgement and insight appear fair. Mood & affect appropriate.     Data Reviewed: I have personally reviewed following labs and imaging studies  CBC: Recent Labs  Lab 12/20/23 0345 12/21/23 0417 12/22/23 0422 12/23/23 0358 12/24/23 0328 12/25/23 0358  WBC 8.9 7.2 8.5 8.9 9.3 10.1  NEUTROABS 8.0*  --   --   --   --  8.1*  HGB 7.9* 8.1* 8.0* 8.2* 8.5* 8.3*  HCT 23.6* 24.4* 24.6* 25.3* 26.4* 26.4*  MCV 92.9 94.2 93.9 95.8 95.3 97.1  PLT 164 162 211 219 258 254    Basic Metabolic Panel: Recent Labs  Lab 12/19/23 0241 12/20/23 0345 12/21/23 0417 12/22/23 0422 12/23/23 0358 12/24/23 0328 12/25/23 0358  NA 132*   < > 136 137 137 137 136  K 4.0   < > 3.9 3.9 4.0 3.9 4.0  CL 99   < > 106 104 107 104 103  CO2 22   < > 24 25  21* 25 22  GLUCOSE 175*   < > 90 77 83 84 82  BUN 12   < > 16 13 11 13 13   CREATININE 0.51*   < > 0.57* 0.60* 0.65 0.67 0.62  CALCIUM  7.9*   < > 7.9* 8.1* 8.1* 8.5* 8.3*  MG 2.1  --  1.8 2.0  --  1.7 1.9  PHOS 4.1  --  3.0  --   --   --   --    < > = values in this interval not displayed.    GFR: Estimated Creatinine Clearance: 88.9 mL/min (by C-G formula based on SCr of 0.62 mg/dL).  Liver Function Tests: No results for input(s): "AST", "ALT", "ALKPHOS", "BILITOT", "PROT", "ALBUMIN " in the last 168 hours.  CBG: Recent Labs  Lab 12/24/23 1144 12/24/23 1655 12/24/23 2114 12/25/23 0606 12/25/23 1212  GLUCAP 109* 102* 105* 76 107*     Recent Results (from the past 240 hours)  Culture, blood (Routine X 2) w Reflex to ID Panel     Status: Abnormal   Collection Time: 12/16/23 10:49 AM   Specimen: BLOOD RIGHT HAND  Result Value Ref Range Status   Specimen Description BLOOD RIGHT HAND  Final   Special Requests   Final    BOTTLES DRAWN AEROBIC AND ANAEROBIC Blood Culture adequate volume   Culture  Setup Time   Final    GRAM NEGATIVE RODS IN BOTH AEROBIC AND ANAEROBIC BOTTLES CRITICAL RESULT CALLED TO, READ BACK BY AND VERIFIED WITH: PHARMD V BRYK 12/17/2023 @ 0131 BY AB Performed at [facility], [address], [place], [place] [address]    Culture PROTEUS MIRABILIS (A)  Final   Report Status 12/18/2023 FINAL  Final   Organism ID, Bacteria PROTEUS MIRABILIS  Final   Organism ID, Bacteria PROTEUS MIRABILIS  Final      Susceptibility   Proteus mirabilis - KIRBY BAUER*    CEFAZOLIN  INTERMEDIATE Intermediate    Proteus mirabilis - MIC*    AMPICILLIN <=2 SENSITIVE Sensitive     CEFEPIME  <=0.12 SENSITIVE Sensitive  CEFTAZIDIME <=1 SENSITIVE Sensitive     CEFTRIAXONE  <=0.25 SENSITIVE Sensitive     CIPROFLOXACIN <=0.25 SENSITIVE Sensitive     GENTAMICIN <=1 SENSITIVE Sensitive     IMIPENEM 2 SENSITIVE Sensitive     TRIMETH/SULFA <=20 SENSITIVE Sensitive      AMPICILLIN/SULBACTAM <=2 SENSITIVE Sensitive     PIP/TAZO <=4 SENSITIVE Sensitive ug/mL    * PROTEUS MIRABILIS    PROTEUS MIRABILIS  Blood Culture ID Panel (Reflexed)     Status: Abnormal   Collection Time: 12/16/23 10:49 AM  Result Value Ref Range Status   Enterococcus faecalis NOT DETECTED NOT DETECTED Final   Enterococcus Faecium NOT DETECTED NOT DETECTED Final   Listeria monocytogenes NOT DETECTED NOT DETECTED Final   Staphylococcus species NOT DETECTED NOT DETECTED Final   Staphylococcus aureus (BCID) NOT DETECTED NOT DETECTED Final   Staphylococcus epidermidis NOT DETECTED NOT DETECTED Final   Staphylococcus lugdunensis NOT DETECTED NOT DETECTED Final   Streptococcus species NOT DETECTED NOT DETECTED Final   Streptococcus agalactiae NOT DETECTED NOT DETECTED Final   Streptococcus pneumoniae NOT DETECTED NOT DETECTED Final   Streptococcus pyogenes NOT DETECTED NOT DETECTED Final   A.calcoaceticus-baumannii NOT DETECTED NOT DETECTED Final   Bacteroides fragilis NOT DETECTED NOT DETECTED Final   Enterobacterales DETECTED (A) NOT DETECTED Final    Comment: Enterobacterales represent a large order of gram negative bacteria, not a single organism. CRITICAL RESULT CALLED TO, READ BACK BY AND VERIFIED WITH: PHARMD V BRYK 12/17/2023 @ 0131 BY AB    Enterobacter cloacae complex NOT DETECTED NOT DETECTED Final   Escherichia coli NOT DETECTED NOT DETECTED Final   Klebsiella aerogenes NOT DETECTED NOT DETECTED Final   Klebsiella oxytoca NOT DETECTED NOT DETECTED Final   Klebsiella pneumoniae NOT DETECTED NOT DETECTED Final   Proteus species DETECTED (A) NOT DETECTED Final    Comment: CRITICAL RESULT CALLED TO, READ BACK BY AND VERIFIED WITH: PHARMD V BRYK 12/17/2023 @ 0131 BY AB    Salmonella species NOT DETECTED NOT DETECTED Final   Serratia marcescens NOT DETECTED NOT DETECTED Final   Haemophilus influenzae NOT DETECTED NOT DETECTED Final   Neisseria meningitidis NOT DETECTED NOT DETECTED  Final   Pseudomonas aeruginosa NOT DETECTED NOT DETECTED Final   Stenotrophomonas maltophilia NOT DETECTED NOT DETECTED Final   Candida albicans NOT DETECTED NOT DETECTED Final   Candida auris NOT DETECTED NOT DETECTED Final   Candida glabrata NOT DETECTED NOT DETECTED Final   Candida krusei NOT DETECTED NOT DETECTED Final   Candida parapsilosis NOT DETECTED NOT DETECTED Final   Candida tropicalis NOT DETECTED NOT DETECTED Final   Cryptococcus neoformans/gattii NOT DETECTED NOT DETECTED Final   CTX-M ESBL NOT DETECTED NOT DETECTED Final   Carbapenem resistance IMP NOT DETECTED NOT DETECTED Final   Carbapenem resistance KPC NOT DETECTED NOT DETECTED Final   Carbapenem resistance NDM NOT DETECTED NOT DETECTED Final   Carbapenem resist OXA 48 LIKE NOT DETECTED NOT DETECTED Final   Carbapenem resistance VIM NOT DETECTED NOT DETECTED Final    Comment: Performed at [facility], [address], [place], [place] [address]  Aerobic/Anaerobic Culture w Gram Stain (surgical/deep wound)     Status: None   Collection Time: 12/16/23 11:11 AM   Specimen: Wound  Result Value Ref Range Status   Specimen Description WOUND  Final   Special Requests NONE  Final   Gram Stain   Final    RARE SQUAMOUS EPITHELIAL CELLS PRESENT RARE WBC PRESENT, PREDOMINANTLY PMN RARE GRAM  NEGATIVE RODS    Culture   Final    FEW KLEBSIELLA AEROGENES FEW PROTEUS MIRABILIS FEW DIPHTHEROIDS(CORYNEBACTERIUM SPECIES) Standardized susceptibility testing for this organism is not available. NO ANAEROBES ISOLATED Performed at [facility], [address] [place], [place] [address]    Report Status 12/21/2023 FINAL  Final   Organism ID, Bacteria KLEBSIELLA AEROGENES  Final   Organism ID, Bacteria PROTEUS MIRABILIS  Final      Susceptibility   Klebsiella aerogenes - MIC*    CEFEPIME  <=0.12 SENSITIVE Sensitive     CEFTAZIDIME <=1 SENSITIVE Sensitive     CEFTRIAXONE  <=0.25 SENSITIVE Sensitive     CIPROFLOXACIN  <=0.25 SENSITIVE Sensitive     GENTAMICIN <=1 SENSITIVE Sensitive     IMIPENEM 1 SENSITIVE Sensitive     TRIMETH/SULFA <=20 SENSITIVE Sensitive     PIP/TAZO <=4 SENSITIVE Sensitive ug/mL    * FEW KLEBSIELLA AEROGENES   Proteus mirabilis - MIC*    AMPICILLIN <=2 SENSITIVE Sensitive     CEFEPIME  <=0.12 SENSITIVE Sensitive     CEFTAZIDIME <=1 SENSITIVE Sensitive     CEFTRIAXONE  <=0.25 SENSITIVE Sensitive     CIPROFLOXACIN <=0.25 SENSITIVE Sensitive     GENTAMICIN <=1 SENSITIVE Sensitive     IMIPENEM 2 SENSITIVE Sensitive     TRIMETH/SULFA <=20 SENSITIVE Sensitive     AMPICILLIN/SULBACTAM <=2 SENSITIVE Sensitive     PIP/TAZO <=4 SENSITIVE Sensitive ug/mL    * FEW PROTEUS MIRABILIS  Culture, blood (Routine X 2) w Reflex to ID Panel     Status: None   Collection Time: 12/16/23  1:03 PM   Specimen: BLOOD LEFT ARM  Result Value Ref Range Status   Specimen Description BLOOD LEFT ARM  Final   Special Requests   Final    BOTTLES DRAWN AEROBIC AND ANAEROBIC Blood Culture adequate volume   Culture   Final    NO GROWTH 5 DAYS Performed at [facility], [address], [place], [place] [address]    Report Status 12/21/2023 FINAL  Final         Radiology Studies: DG CHEST PORT 1 VIEW Result Date: 12/24/2023 CLINICAL DATA:  Cough EXAM: PORTABLE CHEST 1 VIEW COMPARISON:  12/16/2023 FINDINGS: The heart size and mediastinal contours are within normal limits. Both lungs are clear. The visualized skeletal structures are unremarkable. IMPRESSION: No active disease. Electronically Signed   By: Juanetta Nordmann M.D.   On: 12/24/2023 11:38        Scheduled Meds:  [MAR Hold] acetaminophen   650 mg Oral Q8H   [MAR Hold] vitamin C   1,000 mg Oral Daily   [MAR Hold] Chlorhexidine  Gluconate Cloth  6 each Topical Daily   [MAR Hold] cyanocobalamin   1,000 mcg Oral Daily   [MAR Hold] feeding supplement  237 mL Oral BID BM   [MAR Hold] finasteride   5 mg Oral Daily   [MAR Hold] folic acid   1 mg Oral  Daily   [MAR Hold] gabapentin   300 mg Oral TID   [MAR Hold] insulin  aspart  0-5 Units Subcutaneous QHS   [MAR Hold] insulin  aspart  0-9 Units Subcutaneous TID WC   [MAR Hold] levothyroxine   112 mcg Oral Q0600   [MAR Hold] lidocaine   2 patch Transdermal Q24H   [MAR Hold] loratadine   10 mg Oral Daily   [MAR Hold] melatonin  3 mg Oral QHS   [MAR Hold] midodrine   10 mg Oral TID WC   [MAR Hold] modafinil   200 mg Oral Daily   [  MAR Hold] oxyCODONE   20 mg Oral Q12H   [MAR Hold] pantoprazole   40 mg Oral Daily   [MAR Hold] polyethylene glycol  17 g Oral Daily   [MAR Hold] predniSONE   15 mg Oral Q breakfast   Followed by   [AOZ Hold] predniSONE   10 mg Oral Q breakfast   Followed by   [facility] Hold] predniSONE   5 mg Oral Q breakfast   Followed by   [facility] Hold] predniSONE   2.5 mg Oral Q breakfast   [MAR Hold] rosuvastatin   5 mg Oral Daily   [MAR Hold] senna  1 tablet Oral BID   [MAR Hold] sodium chloride  flush  3 mL Intravenous Q12H   [MAR Hold] zinc  sulfate (50mg  elemental zinc )  220 mg Oral Daily   Continuous Infusions:  [MAR Hold] ceFEPime  (MAXIPIME ) IV 2 g (12/25/23 1258)   lactated ringers  10 mL/hr at 12/25/23 1336     LOS: 20 days    Time spent: 35 minutes    Hilda Lovings, MD [facility]   To contact the attending provider between 7A-7P or the covering provider during after hours 7P-7A, please log into the web site [email or web address] and access using universal [place] password for that web site. If you do not have the password, please call the hospital operator.  12/25/2023, 2:51 PM

## 2023-12-25 NOTE — Op Note (Signed)
 12/25/2023  4:42 PM  PATIENT:  Norman Lambert  73 y.o. male  PRE-OPERATIVE DIAGNOSIS: Lumbar wound dehiscence with wound drainage  POST-OPERATIVE DIAGNOSIS:  same  PROCEDURE: Irrigation and debridement of lumbar wound utilizing blunt and sharp dissection, wound length 20 cm, placement of external wound VAC  SURGEON:  Waymond Hailey, MD  ASSISTANTS: Jackee Marus, FNP  ANESTHESIA:   General  EBL: 25 ml  Total I/O In: 240 [P.O.:240] Out: 525 [Urine:500; Blood:25]  BLOOD ADMINISTERED: none  DRAINS: Medium Hemovac and external wound VAC  SPECIMEN:  none  INDICATION FOR PROCEDURE: This patient presented with continued wound drainage from dehiscence in the middle of the wound despite treatment with an external wound VAC for almost 2 weeks.  Recommended rugation and debridement of the wound with primary closure. Patient understood the risks, benefits, and alternatives and potential outcomes and wished to proceed.  PROCEDURE DETAILS: The patient was taken to the operating room and after induction of adequate generalized endotracheal anesthesia, the patient was rolled into the prone position on chest rolls and all pressure points were padded. The lumbar region was cleaned and then prepped with DuraPrep and draped in the usual sterile fashion. 5 cc of local anesthesia was injected and then a dorsal midline incision was made and carried down to the lumbo sacral fascia. The fascia was opened and the paraspinous musculature was taken down in a subperiosteal fashion to expose the operative bed and the instrumentation from L2-S1.  We debrided the tissues with both blunt and sharp dissection.  We removed any devitalized tissue.  We inspected the instrumentation and felt the instrumentation on the right was quite intact and had good purchase.  However we felt like the left L5 and S1 screws may have had a little bit of wiggle to them.  We did not have access to instrumentation to remove or to revise any  of the hardware at this time.  And because this was a "soft call" we felt that to continue our surgical plan and to image him with CT scan postoperatively was the best course.   I irrigated with 0.5% povidone iodine solution followed by Vashe.. Achieved hemostasis with bipolar cautery, placed bilateral medium Hemovac drains, and then closed the fascia with 0 Vicryl.  Placed 2 g of myriad fine morsels into the wound to help with wound healing.  I closed the subcutaneous tissues with 2-0 Vicryl and the subcuticular tissues with 3-0 Vicryl.  We then sewed a linear strip of myriad followed by sorbact on top of the wound.  The drapes were removed, an sternal negative pressure wound dressing was applied.  My nurse practitioner was involved in the exposure, the debridement, the exploration of the fusion, and the closure. the patient was awakened from general anesthesia and transferred to the recovery room in stable condition. At the end of the procedure all sponge, needle and instrument counts were correct.    PLAN OF CARE: Admit to inpatient   PATIENT DISPOSITION:  PACU - hemodynamically stable.   Delay start of Pharmacological VTE agent (>24hrs) due to surgical blood loss or risk of bleeding:  yes

## 2023-12-25 NOTE — Transfer of Care (Signed)
 Immediate Anesthesia Transfer of Care Note  Patient: Norman Lambert  Procedure(s) Performed: LUMBAR WOUND IRRIGATION AND DEBRIDEMENT  Patient Location: PACU  Anesthesia Type:General  Level of Consciousness: awake and alert   Airway & Oxygen Therapy: Patient Spontanous Breathing  Post-op Assessment: Report given to RN  Post vital signs: Reviewed and stable  Last Vitals:  Vitals Value Taken Time  BP 165/70 12/25/23 1649  Temp    Pulse 77 12/25/23 1652  Resp 16 12/25/23 1652  SpO2 99 % 12/25/23 1652  Vitals shown include unfiled device data.  Last Pain:  Vitals:   12/25/23 1324  TempSrc:   PainSc: 4       Patients Stated Pain Goal: 0 (12/25/23 1139)  Complications: No notable events documented.

## 2023-12-25 NOTE — Progress Notes (Signed)
 [facility]    Date of Admission:  12/05/2023   Total days of antibiotics 10   ID: Norman Lambert is a 73 y.o. male with  hx of L4-5 fusion in late march, revision to posterior fixation to L2-S1 and redo decompressive laminectomy of L3-L4 due to failed fusion with fractured L4 body . Complicated wound dehiscence + pseudomonas and proteus but only proteus bacteremia on 5/3 Principal Problem:   Back pain Active Problems:   S/P lumbar fusion   Septic shock (HCC)   Bacteremia due to Proteus species   Pressure injury of skin   Paroxysmal A-fib (HCC)   Acute postoperative anemia due to expected blood loss   Benign prostatic hyperplasia   Gastroesophageal reflux disease   Acute metabolic encephalopathy    Subjective: Afebrile but having significant back pain. Going to back to OR this afternoon for I x D  Medications:   vitamin C   1,000 mg Oral Daily   Chlorhexidine  Gluconate Cloth  6 each Topical Daily   cyanocobalamin   1,000 mcg Oral Daily   feeding supplement  237 mL Oral BID BM   fentaNYL        fentaNYL        finasteride   5 mg Oral Daily   folic acid   1 mg Oral Daily   gabapentin   300 mg Oral TID   HYDROmorphone        insulin  aspart  0-5 Units Subcutaneous QHS   insulin  aspart  0-9 Units Subcutaneous TID WC   levothyroxine   112 mcg Oral Q0600   lidocaine   2 patch Transdermal Q24H   loratadine   10 mg Oral Daily   melatonin  3 mg Oral QHS   midodrine   10 mg Oral TID WC   modafinil   200 mg Oral Daily   oxyCODONE   20 mg Oral Q12H   pantoprazole   40 mg Oral Daily   polyethylene glycol  17 g Oral Daily   predniSONE   15 mg Oral Q breakfast   Followed by   [name] ON 12/27/2023] predniSONE   10 mg Oral Q breakfast   Followed by   [name] ON 01/03/2024] predniSONE   5 mg Oral Q breakfast   Followed by   [name] ON 01/10/2024] predniSONE   2.5 mg Oral Q breakfast   rosuvastatin   5 mg Oral Daily   senna  1 tablet Oral BID   sodium chloride  flush  3 mL  Intravenous Q12H   sodium chloride  flush  3 mL Intravenous Q12H   sodium chloride  flush  3-10 mL Intravenous Q12H   zinc  sulfate (50mg  elemental zinc )  220 mg Oral Daily    Objective: Vital signs in last 24 hours: Temp:  [97.6 F (36.4 C)-98.2 F (36.8 C)] 97.8 F (36.6 C) (05/12 1810) Pulse Rate:  [59-84] 77 (05/12 1810) Resp:  [9-20] 14 (05/12 1810) BP: (106-180)/(50-82) 157/61 (05/12 1810) SpO2:  [93 %-100 %] 99 % (05/12 1810) Weight:  [92.1 kg] 92.1 kg (05/12 1316)  Physical Exam  Constitutional: He is oriented to person, place, and time. He appears well-developed and well-nourished. No distress.  HENT:  Mouth/Throat: Oropharynx is clear and moist. No oropharyngeal exudate.  Cardiovascular: Normal rate, regular rhythm and normal heart sounds. Exam reveals no gallop and no friction rub.  No murmur heard.  Pulmonary/Chest: Effort normal and breath sounds normal. No respiratory distress. He has no wheezes.  Abdominal: Soft. Bowel sounds are normal. He exhibits no distension. There is no tenderness.  Back: wound vac  Neurological: He is alert and oriented to person, place, and time.  Skin: Skin is warm and dry. No rash noted. No erythema.  Psychiatric: He has a normal mood and affect. His behavior is normal.    Lab Results Recent Labs    12/24/23 0328 12/25/23 0358  WBC 9.3 10.1  HGB 8.5* 8.3*  HCT 26.4* 26.4*  NA 137 136  K 3.9 4.0  CL 104 103  CO2 25 22  BUN 13 13  CREATININE 0.67 0.62   Lab Results  Component Value Date   ESRSEDRATE 38 (H) 12/05/2023    Microbiology: Susceptibility   Klebsiella aerogenes Proteus mirabilis    MIC MIC    AMPICILLIN   <=2 SENSITIVE Sensitive    AMPICILLIN/SULBACTAM   <=2 SENSITIVE Sensitive    CEFEPIME  <=0.12 SENS... Sensitive <=0.12 SENS... Sensitive    CEFTAZIDIME <=1 SENSITIVE Sensitive <=1 SENSITIVE Sensitive    CEFTRIAXONE  <=0.25 SENS... Sensitive <=0.25 SENS... Sensitive    CIPROFLOXACIN <=0.25 SENS... Sensitive  <=0.25 SENS... Sensitive    GENTAMICIN <=1 SENSITIVE Sensitive <=1 SENSITIVE Sensitive    IMIPENEM 1 SENSITIVE Sensitive 2 SENSITIVE Sensitive    PIP/TAZO <=4 SENSITI... Sensitive <=4 SENSITI... Sensitive    TRIMETH/SULFA <=20 SENSIT... Sensitive <=20 SENSIT... Sensitive    Blood cx on 5/3 Culture PROTEUS MIRABILIS Abnormal   Report Status 12/18/2023 FINAL  Organism ID, Bacteria PROTEUS MIRABILIS  Organism ID, Bacteria PROTEUS MIRABILIS  Resulting Agency [facility]     Susceptibility   Proteus mirabilis (ZZ00) Proteus mirabilis (ZZ01)    KIRBY BAUER MIC    AMPICILLIN   <=2 SENSITIVE Sensitive    AMPICILLIN/SULBACTAM   <=2 SENSITIVE Sensitive    CEFAZOLIN  INTERMEDIATE Intermediate      CEFEPIME    <=0.12 SENS... Sensitive    CEFTAZIDIME   <=1 SENSITIVE Sensitive    CEFTRIAXONE    <=0.25 SENS... Sensitive    CIPROFLOXACIN   <=0.25 SENS... Sensitive    GENTAMICIN   <=1 SENSITIVE Sensitive    IMIPENEM   2 SENSITIVE Sensitive    PIP/TAZO   <=4 SENSITI... Sensitive    TRIMETH/SULFA   <=20 SENSIT... Sensitive      Studies/Results: DG CHEST PORT 1 VIEW Result Date: 12/24/2023 CLINICAL DATA:  Cough EXAM: PORTABLE CHEST 1 VIEW COMPARISON:  12/16/2023 FINDINGS: The heart size and mediastinal contours are within normal limits. Both lungs are clear. The visualized skeletal structures are unremarkable. IMPRESSION: No active disease. Electronically Signed   By: Juanetta Nordmann M.D.   On: 12/24/2023 11:38     Assessment/Plan:  Early Fusion ssi with proteus/pseudomonas wound infection c/b secondary proteus bacteremia  - continue on cefepime  for the time being. Will watch for side effects in the elderly and may need to consider changing to different agent should that happen - can get picc line - await or report to help determine length of therapy. If there is HW involvement/loosening, would recommend 6 wk of iv therapy  Pain management = defer to primary team for acute needs given he is on  chronic opiates.  I have personally spent 35 minutes involved in face-to-face and non-face-to-face activities for this patient on the day of the visit. Professional time spent includes the following activities: Preparing to see the patient (review of tests), Obtaining and/or reviewing separately obtained history (admission/discharge record), Performing a medically appropriate examination and/or evaluation , Ordering medications/tests/procedures, referring and communicating with other health care professionals, Documenting clinical information in the EMR, Independently interpreting results (not separately reported), Communicating results  to the patient/family/caregiver, Counseling and educating the patient/family/caregiver and Care coordination (not separately reported).     [facility] Pager: [phone]  12/25/2023, 6:17 PM

## 2023-12-25 NOTE — Plan of Care (Signed)
   Problem: Education: Goal: Knowledge of General Education information will improve Description Including pain rating scale, medication(s)/side effects and non-pharmacologic comfort measures Outcome: Progressing   Problem: Health Behavior/Discharge Planning: Goal: Ability to manage health-related needs will improve Outcome: Progressing

## 2023-12-25 NOTE — Anesthesia Procedure Notes (Signed)
 Procedure Name: Intubation Date/Time: 12/25/2023 3:46 PM  Performed by: Hershall Lory, CRNAPre-anesthesia Checklist: Patient identified, Emergency Drugs available, Suction available and Patient being monitored Patient Re-evaluated:Patient Re-evaluated prior to induction Oxygen Delivery Method: Circle system utilized Preoxygenation: Pre-oxygenation with 100% oxygen Induction Type: IV induction Ventilation: Mask ventilation without difficulty Laryngoscope Size: Glidescope Grade View: Grade II Tube type: Oral Tube size: 7.5 mm Number of attempts: 1 Airway Equipment and Method: Stylet and Oral airway Placement Confirmation: ETT inserted through vocal cords under direct vision, positive ETCO2 and breath sounds checked- equal and bilateral Secured at: 22 cm Tube secured with: Tape Dental Injury: Teeth and Oropharynx as per pre-operative assessment

## 2023-12-25 NOTE — TOC Progression Note (Signed)
 Transition of Care [facility]) - Progression Note    Patient Details  Name: Norman Lambert MRN: [id number] Date of Birth: [birth year]  [facility]) CM/SW Contact  Valery Gaucher, [place] Phone Number: 12/25/2023, 4:13 PM  Clinical Narrative:     Received Pasrr# [id number]  Expected Discharge Plan: Home w Home Health Services Barriers to Discharge: Continued Medical Work up  Expected Discharge Plan and Services In-house Referral: Clinical Social Work   Post Acute Care Choice: Home Health Living arrangements for the past 2 months: Single Family Home                                       Social Determinants of Health (SDOH) Interventions SDOH Screenings   Food Insecurity: No Food Insecurity (12/06/2023)  Housing: Low Risk  (12/06/2023)  Transportation Needs: No Transportation Needs (12/06/2023)  Utilities: Not At Risk (12/06/2023)  Financial Resource Strain: Low Risk  (06/05/2023)   Received from [facility]  Physical Activity: Unknown (06/05/2023)   Received from [facility]  Social Connections: Moderately Isolated (12/11/2023)  Stress: No Stress Concern Present (08/21/2023)   Received from [facility]  Tobacco Use: Medium Risk (12/25/2023)    Readmission Risk Interventions     No data to display

## 2023-12-25 NOTE — TOC Progression Note (Signed)
 Transition of Care [facility]) - Progression Note    Patient Details  Name: Norman Lambert MRN: [id number] Date of Birth: [birth year]  Transition of Care [facility]) CM/SW Contact  Valery Gaucher, [place] Phone Number: 12/25/2023, 3:40 PM  Clinical Narrative:     Attempted to contact spouse to provide bed offers, informed number dialed is no longer in service.   Patient is off the until, will follow up with patient once he returns to unit if time allows.   Liddie Reel, MSW, LCSW Clinical Social Worker    Expected Discharge Plan: Home w Home Health Services Barriers to Discharge: Continued Medical Work up  Expected Discharge Plan and Services In-house Referral: Clinical Social Work   Post Acute Care Choice: Home Health Living arrangements for the past 2 months: Single Family Home                                       Social Determinants of Health (SDOH) Interventions SDOH Screenings   Food Insecurity: No Food Insecurity (12/06/2023)  Housing: Low Risk  (12/06/2023)  Transportation Needs: No Transportation Needs (12/06/2023)  Utilities: Not At Risk (12/06/2023)  Financial Resource Strain: Low Risk  (06/05/2023)   Received from [facility]  Physical Activity: Unknown (06/05/2023)   Received from [facility]  Social Connections: Moderately Isolated (12/11/2023)  Stress: No Stress Concern Present (08/21/2023)   Received from [facility]  Tobacco Use: Medium Risk (12/25/2023)    Readmission Risk Interventions     No data to display

## 2023-12-25 NOTE — Anesthesia Preprocedure Evaluation (Signed)
 Anesthesia Evaluation  Patient identified by MRN, date of birth, ID band Patient awake    Reviewed: Allergy  & Precautions, H&P , NPO status , Patient's Chart, lab work & pertinent test results  Airway Mallampati: II   Neck ROM: full    Dental   Pulmonary sleep apnea , former smoker   breath sounds clear to auscultation       Cardiovascular hypertension, + Peripheral Vascular Disease   Rhythm:regular Rate:Normal     Neuro/Psych    GI/Hepatic ,GERD  ,,  Endo/Other  Hypothyroidism    Renal/GU      Musculoskeletal  (+) Arthritis ,    Abdominal   Peds  Hematology   Anesthesia Other Findings   Reproductive/Obstetrics                             Anesthesia Physical Anesthesia Plan  ASA: 3  Anesthesia Plan: General   Post-op Pain Management:    Induction: Intravenous  PONV Risk Score and Plan: 2 and Ondansetron , Dexamethasone  and Treatment may vary due to age or medical condition  Airway Management Planned: Oral ETT  Additional Equipment:   Intra-op Plan:   Post-operative Plan: Extubation in OR  Informed Consent: I have reviewed the patients History and Physical, chart, labs and discussed the procedure including the risks, benefits and alternatives for the proposed anesthesia with the patient or authorized representative who has indicated his/her understanding and acceptance.     Dental advisory given  Plan Discussed with: CRNA, Anesthesiologist and Surgeon  Anesthesia Plan Comments:        Anesthesia Quick Evaluation

## 2023-12-25 NOTE — Progress Notes (Signed)
 Patient ID: Norman Lambert, male   DOB: March 24, 1951, 73 y.o.   MRN: [id number] We removed the wound VAC and there is still quite a bit of drainage from the wound.  It is pink and serosanguineous.  There is no exudate.  There is no evidence of infection.  The length of dehiscence is about 3 cm.  We have recommended irrigation and debridement of the wound with primary closure as I think this will help the wound heal faster then continued external wound VAC.  I think this will reduce his hospital stay.  They understand the risk include but are not limited to bleeding, infection, need for further surgery, lack of relief of symptoms, worsening symptoms and anesthesia risk.  They agreed to proceed.  He will be n.p.o. will try to add him on for today.

## 2023-12-26 ENCOUNTER — Inpatient Hospital Stay (HOSPITAL_COMMUNITY)

## 2023-12-26 ENCOUNTER — Other Ambulatory Visit: Payer: Self-pay

## 2023-12-26 ENCOUNTER — Encounter (HOSPITAL_COMMUNITY): Payer: Self-pay | Admitting: Neurological Surgery

## 2023-12-26 DIAGNOSIS — T8149XA Infection following a procedure, other surgical site, initial encounter: Secondary | ICD-10-CM | POA: Diagnosis not present

## 2023-12-26 DIAGNOSIS — R7881 Bacteremia: Secondary | ICD-10-CM | POA: Diagnosis not present

## 2023-12-26 DIAGNOSIS — B964 Proteus (mirabilis) (morganii) as the cause of diseases classified elsewhere: Secondary | ICD-10-CM | POA: Diagnosis not present

## 2023-12-26 DIAGNOSIS — D62 Acute posthemorrhagic anemia: Secondary | ICD-10-CM | POA: Diagnosis not present

## 2023-12-26 DIAGNOSIS — Z981 Arthrodesis status: Secondary | ICD-10-CM | POA: Diagnosis not present

## 2023-12-26 DIAGNOSIS — M5440 Lumbago with sciatica, unspecified side: Secondary | ICD-10-CM | POA: Diagnosis not present

## 2023-12-26 DIAGNOSIS — G9341 Metabolic encephalopathy: Secondary | ICD-10-CM | POA: Diagnosis not present

## 2023-12-26 DIAGNOSIS — K219 Gastro-esophageal reflux disease without esophagitis: Secondary | ICD-10-CM | POA: Diagnosis not present

## 2023-12-26 LAB — CBC WITH DIFFERENTIAL/PLATELET
Abs Immature Granulocytes: 0.18 10*3/uL — ABNORMAL HIGH (ref 0.00–0.07)
Basophils Absolute: 0 10*3/uL (ref 0.0–0.1)
Basophils Relative: 0 %
Eosinophils Absolute: 0 10*3/uL (ref 0.0–0.5)
Eosinophils Relative: 0 %
HCT: 24.9 % — ABNORMAL LOW (ref 39.0–52.0)
Hemoglobin: 7.9 g/dL — ABNORMAL LOW (ref 13.0–17.0)
Immature Granulocytes: 1 %
Lymphocytes Relative: 4 %
Lymphs Abs: 0.5 10*3/uL — ABNORMAL LOW (ref 0.7–4.0)
MCH: 30.2 pg (ref 26.0–34.0)
MCHC: 31.7 g/dL (ref 30.0–36.0)
MCV: 95 fL (ref 80.0–100.0)
Monocytes Absolute: 0.2 10*3/uL (ref 0.1–1.0)
Monocytes Relative: 2 %
Neutro Abs: 12.4 10*3/uL — ABNORMAL HIGH (ref 1.7–7.7)
Neutrophils Relative %: 93 %
Platelets: 262 10*3/uL (ref 150–400)
RBC: 2.62 MIL/uL — ABNORMAL LOW (ref 4.22–5.81)
RDW: 13.6 % (ref 11.5–15.5)
WBC: 13.3 10*3/uL — ABNORMAL HIGH (ref 4.0–10.5)
nRBC: 0 % (ref 0.0–0.2)

## 2023-12-26 LAB — GLUCOSE, CAPILLARY
Glucose-Capillary: 111 mg/dL — ABNORMAL HIGH (ref 70–99)
Glucose-Capillary: 128 mg/dL — ABNORMAL HIGH (ref 70–99)
Glucose-Capillary: 113 mg/dL — ABNORMAL HIGH (ref 70–99)
Glucose-Capillary: 121 mg/dL — ABNORMAL HIGH (ref 70–99)

## 2023-12-26 LAB — BASIC METABOLIC PANEL WITH GFR
Anion gap: 10 (ref 5–15)
BUN: 15 mg/dL (ref 8–23)
CO2: 23 mmol/L (ref 22–32)
Calcium: 8.3 mg/dL — ABNORMAL LOW (ref 8.9–10.3)
Chloride: 103 mmol/L (ref 98–111)
Creatinine, Ser: 0.7 mg/dL (ref 0.61–1.24)
GFR, Estimated: >60 mL/min (ref >60)
Glucose, Bld: 125 mg/dL — ABNORMAL HIGH (ref 70–99)
Potassium: 4.6 mmol/L (ref 3.5–5.1)
Sodium: 136 mmol/L (ref 135–145)

## 2023-12-26 MED ORDER — ENOXAPARIN SODIUM 60 MG/0.6ML IJ SOSY
50.0000 mg | PREFILLED_SYRINGE | Freq: Every day | INTRAMUSCULAR | Status: DC
  Administered 2023-12-27 – 2023-12-28 (×2): 50 mg via SUBCUTANEOUS
  Filled 2023-12-26 (×2): qty 0.6

## 2023-12-26 MED ORDER — SODIUM CHLORIDE 0.9% FLUSH: 10.0000 mL | INTRAVENOUS | Status: DC | PRN

## 2023-12-26 NOTE — Progress Notes (Signed)
 Consult/PROGRESS NOTE    Norman Lambert  DGU:[id number] DOB: May 05, 1951 DOA: 12/05/2023 PCP: Patient, No Pcp Per    No chief complaint on file.   Brief Narrative:  73 year old male patient who was admitted on 4/25 for reduction and internal fixation of previous fusions involving L2-S1. Postoperative course initially seemed unremarkable pain had improved still not able to walk as of 4/29 on 4/30 began to have new increased pain involving the left foot, on 5/1 early in the morning continued to report 10 out of 10 pain seemingly not responding to as needed narcotics. Imaging of the back was unremarkable, he continued to have drain VAC in place in the evening hours of 5/2 began to have borderline hypotension with tachycardia 70 have shakes and tremors in the evening, temperature spike to 103 was started on IV cefepime  and vancomycin  with concern about sepsis white blood cell count elevated to 17,000 there was significant wound erythema with serosanguineous foul-smelling drainage from the surgical site. Initially internal medicine was consulted but after administering about a liter of crystalloid the patient remained hypotensive in the 70s so critical care was asked to evaluate for possible transfer to the intensive care. Levophed  subsequently weaned off and patient transferred to the floor, TRH consulted to help with chronic medical issues.  Significant Hospital Events: Including procedures, antibiotic start and stop dates in addition to other pertinent events   4/25 Extensive L2-S1 lumbar surgery 4/30 New onset pain  5/1 Ongoing pain borderline blood pressure 5/2 Pain, leukocytosis fever, 5/3 Hypotension, not responding to crystalloid started on broad-spectrum antibiotics wound assessed by surgical team culture sent imaging pending ICU transfer. CT imaging of lumbar spine was negative for definitive abscess there were stable fractures at L3 and L4 with possible new fracture involving the transverse  process of L2 and L3 on the left postoperative changes noted.CT angio of chest was negative for pulmonary emboli had bibasilar airspace disease the right was a little worse than the left consistent with possible pneumonia there was aortic aneurysmal dilation of the ascending aorta measuring at 4.5 cm 5/4 Weaned off norepinephrine , Blood cultures from 1 out of 2 samples growing gram-negative rods with BC ID showing both proteus and Enterobacterales, MRSA PCR pending. Narrowed to ceftriaxone . 5/5 Remains on low dose NE. Febrile. BCx growing proteus.  5/6 Wound Cx with proteus, klebsiella aerogenes. Abx re-broadened to cefepime . 5/8 Required low dose norepi overnight but off this AM. His wound vac was changed this AM and has been having some pain 5/9 no issues overnight, denies any acute pain this a.m.  Assessment & Plan:   Principal Problem:   Back pain Active Problems:   S/P lumbar fusion   Septic shock (HCC)   Bacteremia due to Proteus species   Pressure injury of skin   Paroxysmal A-fib (HCC)   Acute postoperative anemia due to expected blood loss   Benign prostatic hyperplasia   Gastroesophageal reflux disease   Acute metabolic encephalopathy  #1 septic shock secondary to Proteus bacteremia with assumed source as the surgical site/possible pneumonia -Surgical wound cultures growing Klebsiella aerogenes and Proteus Mirabilis - Blood cultures from 12/16/2023 with Proteus Mirabilis. - It is noted that neurosurgery noticed wounds of dehiscence over surgical site 5/8, wound VAC replaced and attached external VAC system instead of the Prevena and plan is to reevaluate the wound on 12/25/2023 with possible return to the OR for debridement and primary closure if needed. -Patient reassessed by neurosurgery and patient for irrigation and debridement of the  wound with primary closure per neurosurgery/primary team today. - Continue current antibiotics of IV cefepime  as recommended per ID. - Continue  midodrine . - Continue steroid taper. - ID following, antibiotic recommendations and coverage as per ID. - Wound VAC in wound management per neurosurgery.  2.  Status post redo of laminectomy L3-4 with posterior fixation L2-S1 4/25 -PT/OT. -Pain management per primary team, neurosurgery. -Per primary team, neurosurgery.  3.  Paroxysmal A-fib with RVR CHA2DS2VASC score 3. -Patient noted to be back in normal sinus rhythm. - Likely triggered by acute infection. - Patient asymptomatic. - 2D echo with a EF of 55 to 60%,NWMA, moderate eccentric LVH of inferior segment.  Normal right ventricular systolic function.  Large epicardial fat pad.  Trivial MR/AR mild aortic valve stenosis. - Currently on Lovenox , will defer anticoagulation plan to primary team, neurosurgery in the immediate postop period.  4.  Hyperlipidemia - Continue statin.  5.  Hypertension -BP was soft this morning.   - Continue midodrine .  - Continue to hold home regimen of antihypertensive medications.  6.  Peripheral vascular disease - Statin.    7.  Acute metabolic encephalopathy secondary to sepsis - Slowly improving clinically however some intermittent bouts of confusion.  - Delirium precautions. - Ensure adequate sleep-wake cycle. - Continue Provigil . - Continue nutrition and bowel regimen.  8.  Postop acute blood loss anemia -Hemoglobin currently stable at 7.9. - Follow H&H. - Transfusion threshold hemoglobin < 7.  9.  BPH -Continue Flomax , finasteride .  10.  GERD - Continue PPI.  11.  Hypothyroidism - Continue Synthroid .  12.  Hyperglycemia in the setting of steroids -CBG noted at 111 this morning. - SSI.  13.  Pressure injuries, not POA Pressure Injury 12/16/23 Buttocks Stage 2 -  Partial thickness loss of dermis presenting as a shallow open injury with a red, pink wound bed without slough. (Active)  12/16/23 1300  Location: Buttocks  Location Orientation:   Staging: Stage 2 -  Partial  thickness loss of dermis presenting as a shallow open injury with a red, pink wound bed without slough.  Wound Description (Comments):   Present on Admission:      Pressure Injury 12/16/23 Toe (Comment  which one) Anterior;Left Deep Tissue Pressure Injury - Purple or maroon localized area of discolored intact skin or blood-filled blister due to damage of underlying soft tissue from pressure and/or shear. (Active)  12/16/23 1300  Location: Toe (Comment  which one)  Location Orientation: Anterior;Left  Staging: Deep Tissue Pressure Injury - Purple or maroon localized area of discolored intact skin or blood-filled blister due to damage of underlying soft tissue from pressure and/or shear.  Wound Description (Comments):   Present on Admission:      Pressure Injury 12/20/23 Heel Right Deep Tissue Pressure Injury - Purple or maroon localized area of discolored intact skin or blood-filled blister due to damage of underlying soft tissue from pressure and/or shear. black (Active)  12/20/23 2000  Location: Heel  Location Orientation: Right  Staging: Deep Tissue Pressure Injury - Purple or maroon localized area of discolored intact skin or blood-filled blister due to damage of underlying soft tissue from pressure and/or shear.  Wound Description (Comments): black  Present on Admission: No       DVT prophylaxis: Lovenox  Code Status: Full Family Communication: Updated patient and wife at bedside. Disposition: Per primary team  Status is: Inpatient Remains inpatient appropriate because: Severity of illness   Consultants:  TRH: Dr. Osborne Blazer 12/16/2023  PCCM: Dr. Diania Fortes  12/16/2023 ID: Dr. Zelda Hickman 12/18/2023  Procedures:  Redo decompressive laminectomy L3-4 for recurrent stenosis with removal of epidural bone graft material/posterior fixation L2-S1 inclusive using Alphatec cortical pedicle screw/intertransverse arthrodesis L2-S1 inclusive using morselized autograft and allograft/exploration of fusion  L3-4 with removal of instrumentation per Neurosurgery: Dr. Rochelle Chu 12/08/2023 2D echo 12/18/2023  Antimicrobials:  Anti-infectives (From admission, onward)    Start     Dose/Rate Route Frequency Ordered Stop   12/19/23 1300  ceFEPIme  (MAXIPIME ) 2 g in sodium chloride  0.9 % 100 mL IVPB        2 g 200 mL/hr over 30 Minutes Intravenous Every 8 hours 12/19/23 1116     12/17/23 1000  vancomycin  (VANCOREADY) IVPB 1500 mg/300 mL  Status:  Discontinued       Placed in "Followed by" Linked Group   1,500 mg 150 mL/hr over 120 Minutes Intravenous Every 24 hours 12/16/23 0920 12/17/23 1009   12/17/23 1000  cefTRIAXone  (ROCEPHIN ) 2 g in sodium chloride  0.9 % 100 mL IVPB  Status:  Discontinued        2 g 200 mL/hr over 30 Minutes Intravenous Every 24 hours 12/17/23 0159 12/19/23 1116   12/16/23 1030  vancomycin  (VANCOREADY) IVPB 2000 mg/400 mL       Placed in "Followed by" Linked Group   2,000 mg 200 mL/hr over 120 Minutes Intravenous  Once 12/16/23 0920 12/16/23 1359   12/16/23 1015  ceFEPIme  (MAXIPIME ) 2 g in sodium chloride  0.9 % 100 mL IVPB  Status:  Discontinued        2 g 200 mL/hr over 30 Minutes Intravenous Every 8 hours 12/16/23 0920 12/17/23 0158   12/08/23 1900  ceFAZolin  (ANCEF ) IVPB 2g/100 mL premix        2 g 200 mL/hr over 30 Minutes Intravenous Every 8 hours 12/08/23 1721 12/09/23 0452   12/08/23 1015  ceFAZolin  (ANCEF ) IVPB 2g/100 mL premix        2 g 200 mL/hr over 30 Minutes Intravenous On call to O.R. 12/08/23 1000 12/08/23 1105   12/08/23 0952  ceFAZolin  (ANCEF ) 2-4 GM/100ML-% IVPB       Note to Pharmacy: Noelle Batten M: cabinet override      12/08/23 0952 12/08/23 1105         Subjective: Lying in bed.  Denies any chest pain or shortness of breath.  Denies any abdominal pain.  Still with complaints of back pain.   Objective: Vitals:   12/26/23 0304 12/26/23 0752 12/26/23 1121 12/26/23 1143  BP: (!) 131/56 (!) 129/53 (!) 112/46   Pulse: 64 67 70 69  Resp: 15 13 15  11   Temp: (!) 97.2 F (36.2 C) 97.8 F (36.6 C) 97.8 F (36.6 C)   TempSrc: Axillary Oral Oral   SpO2: 93% 99% 94% 97%  Weight:      Height:        Intake/Output Summary (Last 24 hours) at 12/26/2023 1323 Last data filed at 12/26/2023 0830 Gross per 24 hour  Intake 999.09 ml  Output 975 ml  Net 24.09 ml   Filed Weights   12/20/23 2106 12/25/23 0500 12/25/23 1316  Weight: 92.6 kg 92.1 kg 92.1 kg    Examination:  General exam: NAD Respiratory system: CTAB.  No wheezes, no crackles, no rhonchi.  Fair air movement.  Speaking in full sentences.  Cardiovascular system: RRR no murmurs rubs or gallops.  No JVD.  No pitting lower extremity edema.   Gastrointestinal system: Abdomen is soft, nontender, nondistended, positive bowel  sounds.  No rebound.  No guarding.  Wound VAC noted on back with dressing in place. Central nervous system: Alert and oriented. No focal neurological deficits. Extremities: No clubbing cyanosis or edema. Skin: No rashes, lesions or ulcers Psychiatry: Judgement and insight appear fair. Mood & affect appropriate.     Data Reviewed: I have personally reviewed following labs and imaging studies  CBC: Recent Labs  Lab 12/20/23 0345 12/21/23 0417 12/22/23 0422 12/23/23 0358 12/24/23 0328 12/25/23 0358 12/26/23 0431  WBC 8.9   < > 8.5 8.9 9.3 10.1 13.3*  NEUTROABS 8.0*  --   --   --   --  8.1* 12.4*  HGB 7.9*   < > 8.0* 8.2* 8.5* 8.3* 7.9*  HCT 23.6*   < > 24.6* 25.3* 26.4* 26.4* 24.9*  MCV 92.9   < > 93.9 95.8 95.3 97.1 95.0  PLT 164   < > 211 219 258 254 262   < > = values in this interval not displayed.    Basic Metabolic Panel: Recent Labs  Lab 12/21/23 0417 12/22/23 0422 12/23/23 0358 12/24/23 0328 12/25/23 0358 12/26/23 0431  NA 136 137 137 137 136 136  K 3.9 3.9 4.0 3.9 4.0 4.6  CL 106 104 107 104 103 103  CO2 24 25 21* 25 22 23   GLUCOSE 90 77 83 84 82 125*  BUN 16 13 11 13 13 15   CREATININE 0.57* 0.60* 0.65 0.67 0.62 0.70   CALCIUM  7.9* 8.1* 8.1* 8.5* 8.3* 8.3*  MG 1.8 2.0  --  1.7 1.9  --   PHOS 3.0  --   --   --   --   --     GFR: Estimated Creatinine Clearance: 88.9 mL/min (by C-G formula based on SCr of 0.7 mg/dL).  Liver Function Tests: No results for input(s): "AST", "ALT", "ALKPHOS", "BILITOT", "PROT", "ALBUMIN " in the last 168 hours.  CBG: Recent Labs  Lab 12/25/23 1212 12/25/23 1814 12/25/23 2107 12/26/23 0617 12/26/23 1123  GLUCAP 107* 111* 173* 111* 128*     No results found for this or any previous visit (from the past 240 hours).        Radiology Studies: CT LUMBAR SPINE WO CONTRAST Result Date: 12/26/2023 CLINICAL DATA:  73 year old male postoperative day 1 surgery for lumbar wound dehiscence, wound drainage. EXAM: CT LUMBAR SPINE WITHOUT CONTRAST TECHNIQUE: Multidetector CT imaging of the lumbar spine was performed without intravenous contrast administration. Multiplanar CT image reconstructions were also generated. RADIATION DOSE REDUCTION: This exam was performed according to the departmental dose-optimization program which includes automated exposure control, adjustment of the mA and/or kV according to patient size and/or use of iterative reconstruction technique. COMPARISON:  Postoperative lumbar spine CT 12/16/2023, previous lumbar CT 12/06/2023. FINDINGS: Segmentation: Normal, the same numbering system use recently. Alignment: Stable. Mild levoconvex lumbar scoliosis with mild grade 1 anterolisthesis of L5 on S1 and otherwise mild straightening of lumbar lordosis. Vertebrae: Postoperative changes, spine hardware and other postoperative findings are described below. Pre-existing L4 superior vertebral body fracture along the course of the right L4 pedicle screw, and similar nondisplaced fracture of the left L4 transverse process stable since 12/06/2023. Since 12/16/2023 there is a new nondisplaced fracture of the left L5 pedicle (series 4, image 102 and sagittal image 72)  superimposed on chronic left L5 pars fracture which is stable. Contralateral right L5 posterior element fracture along the lateral margin of the pedicle (series 4, image 106) is stable since 12/16/2023, new since 11/16/2023. See also  coronal image 57. Nondisplaced left superior sacral ala fracture on series 4, image 113 is stable since 12/16/2023, also new since 12/06/2023. Visible SI joints remain aligned. Sacrum elsewhere appears to remain intact. Paraspinal and other soft tissues: New postoperative changes to the paraspinal soft tissues with 2 postoperative drains in place tracking along the posterior elements of L2 through L5. superimposed postoperative bone graft material and small volume of postoperative soft tissue gas. No organized or drainable fluid collection is evident. Superimposed Aortoiliac calcified atherosclerosis. Stable visible abdominal viscera. Increased retained stool in the visible rectosigmoid colon. Disc levels: T11-T12: Interbody ankylosis from right far lateral flowing endplate osteophyte. T12-L1:  Stable.  Anterior disc osteophyte degeneration. L1-L2:  Stable.  Mostly anterior disc bulging and endplate spurring. L2-L3: Bilateral L2 and L3 pedicle screws are stable since 12/16/2023. No interbody implant. Stable interbody vacuum phenomena. No convincing fracture along this hardware. L3-L4: Stable removal of the left L4 pedicle screw since 12/16/2023. Interbody implant here with stable endplate subsidence. Stable right L4 superior endplate fracture since last month. Posterior decompression. L4-L5: L4 hardware detailed above and L5 hardware detailed below. No interbody implant. Stable vacuum disc. Stable left laminectomy. L5-S1: Left L5 transpedicular and right lateral marginal pedicle fractures as above. These are along the course of the bilateral L5 screws. The L5 hardware itself appears stable. Interbody implant appears stable. S1 pedicle screws appear stable with stable left superior  sacral ala fracture (coronal image 52). IMPRESSION: 1. Posterior paraspinal postoperative drains now in place, with recent postoperative soft tissue gas. No discrete soft tissue or bony changes of Infection by CT. 2. However, new Left L5 nondisplaced pedicle fracture since 12/16/2023, which is superimposed on stable minimally displaced fractures along the lateral margin of the right L5 pedicle and the left S1 superior sacral ala (along the right L5 and left S1 screws, respectively). 3. Stable nondisplaced fracture of the right superior L4 vertebral body contiguous with the right L4 pedicle screw since last month. Stable previously removed left L4 pedicle screw. And stable other spinal hardware. 4. Aortic Atherosclerosis (ICD10-I70.0). Increased rectosigmoid retained stool. Electronically Signed   By: Marlise Simpers M.D.   On: 12/26/2023 13:19   US  EKG SITE RITE Result Date: 12/26/2023 If Site Rite image not attached, placement could not be confirmed due to current cardiac rhythm.       Scheduled Meds:  vitamin C   1,000 mg Oral Daily   Chlorhexidine  Gluconate Cloth  6 each Topical Daily   cyanocobalamin   1,000 mcg Oral Daily   [START ON 12/27/2023] enoxaparin  (LOVENOX ) injection  50 mg Subcutaneous Daily   feeding supplement  237 mL Oral BID BM   finasteride   5 mg Oral Daily   folic acid   1 mg Oral Daily   gabapentin   300 mg Oral TID   insulin  aspart  0-5 Units Subcutaneous QHS   insulin  aspart  0-9 Units Subcutaneous TID WC   levothyroxine   112 mcg Oral Q0600   lidocaine   2 patch Transdermal Q24H   loratadine   10 mg Oral Daily   melatonin  3 mg Oral QHS   midodrine   10 mg Oral TID WC   modafinil   200 mg Oral Daily   oxyCODONE   20 mg Oral Q12H   pantoprazole   40 mg Oral Daily   polyethylene glycol  17 g Oral Daily   [START ON 12/27/2023] predniSONE   10 mg Oral Q breakfast   Followed by   [name] ON 01/03/2024] predniSONE   5 mg Oral Q  breakfast   Followed by   [name] ON 01/10/2024] predniSONE    2.5 mg Oral Q breakfast   rosuvastatin   5 mg Oral Daily   senna  1 tablet Oral BID   sodium chloride  flush  3 mL Intravenous Q12H   sodium chloride  flush  3 mL Intravenous Q12H   sodium chloride  flush  3-10 mL Intravenous Q12H   zinc  sulfate (50mg  elemental zinc )  220 mg Oral Daily   Continuous Infusions:  sodium chloride      ceFEPime  (MAXIPIME ) IV 2 g (12/26/23 0620)     LOS: 21 days    Time spent: 35 minutes    Hilda Lovings, MD [facility]   To contact the attending provider between 7A-7P or the covering provider during after hours 7P-7A, please log into the web site [email or web address] and access using universal [place] password for that web site. If you do not have the password, please call the hospital operator.  12/26/2023, 1:23 PM

## 2023-12-26 NOTE — Progress Notes (Signed)
 Peripherally Inserted Central Catheter Placement  The IV Nurse has discussed with the patient and/or persons authorized to consent for the patient, the purpose of this procedure and the potential benefits and risks involved with this procedure.  The benefits include less needle sticks, lab draws from the catheter, and the patient may be discharged home with the catheter. Risks include, but not limited to, infection, bleeding, blood clot (thrombus formation), and puncture of an artery; nerve damage and irregular heartbeat and possibility to perform a PICC exchange if needed/ordered by physician.  Alternatives to this procedure were also discussed.  Bard Power PICC patient education guide, fact sheet on infection prevention and patient information card has been provided to patient /or left at bedside.    PICC Placement Documentation  PICC Single Lumen 12/26/23 Right Brachial 40 cm 0 cm (Active)  Indication for Insertion or Continuance of Line Prolonged intravenous therapies 12/26/23 1333  Exposed Catheter (cm) 0 cm 12/26/23 1333  Site Assessment Clean, Dry, Intact 12/26/23 1333  Line Status Flushed;Saline locked;Blood return noted 12/26/23 1333  Dressing Type Transparent;Securing device 12/26/23 1333  Dressing Status Antimicrobial disc/dressing in place;Clean, Dry, Intact 12/26/23 1333  Line Care Connections checked and tightened 12/26/23 1333  Line Adjustment (NICU/IV Team Only) No 12/26/23 1333  Dressing Intervention New dressing;Adhesive placed at insertion site (IV team only) 12/26/23 1333  Dressing Change Due 01/02/24 12/26/23 1333    Patient's spouse, Psalm Rosco, signed consent at bedside, prior to PICC insertion.   Jacklin Zwick 12/26/2023, 1:35 PM

## 2023-12-26 NOTE — TOC Progression Note (Addendum)
 Transition of Care [facility]) - Progression Note    Patient Details  Name: Norman Lambert MRN: [id number] Date of Birth: 1950/09/09  [facility] CM/SW Contact  Valery Gaucher, [place] Phone Number: 12/26/2023, 12:05 PM  Clinical Narrative:     CSW met with patient and his spouse at bedside. CSW introduced self and explained role. Patient and spouse SNF choice is [facility].  Sent message to [facility] to confirm availability.   TOC will continue to follow and assist with discharge planning.  Liddie Reel, MSW, LCSW Clinical Social Worker    Expected Discharge Plan: Home w Home Health Services Barriers to Discharge: Continued Medical Work up  Expected Discharge Plan and Services In-house Referral: Clinical Social Work   Post Acute Care Choice: Home Health Living arrangements for the past 2 months: Single Family Home                                       Social Determinants of Health (SDOH) Interventions SDOH Screenings   Food Insecurity: No Food Insecurity (12/06/2023)  Housing: Low Risk  (12/06/2023)  Transportation Needs: No Transportation Needs (12/06/2023)  Utilities: Not At Risk (12/06/2023)  Financial Resource Strain: Low Risk  (06/05/2023)   Received from [facility]  Physical Activity: Unknown (06/05/2023)   Received from [facility]  Social Connections: Moderately Isolated (12/11/2023)  Stress: No Stress Concern Present (08/21/2023)   Received from [facility]  Tobacco Use: Medium Risk (12/25/2023)    Readmission Risk Interventions     No data to display

## 2023-12-26 NOTE — Progress Notes (Signed)
 PHARMACY - ANTICOAGULATION CONSULT NOTE  Pharmacy Consult for lovenox  Indication: VTE prophylaxis  Allergies  Allergen Reactions   Benzoic Acid Hives and Rash    Positive Patch Test 11/12/20   Imidurea Rash    Positive Patch Test 11/12/20   Urea  Rash    Positive Patch Test 11/12/20   Other Rash and Other (See Comments)    " DARK DYES " [ UNSPECIFIED DYE ]    Patient Measurements: Height: 6' (182.9 cm) Weight: 92.1 kg (203 lb 0.7 oz) IBW/kg (Calculated) : 77.6 HEPARIN  DW (KG): 92.1  Vital Signs: Temp: 97.8 F (36.6 C) (05/13 0752) Temp Source: Oral (05/13 0752) BP: 129/53 (05/13 0752) Pulse Rate: 67 (05/13 0752)  Labs: Recent Labs    12/24/23 0328 12/25/23 0358 12/26/23 0431  HGB 8.5* 8.3* 7.9*  HCT 26.4* 26.4* 24.9*  PLT 258 254 262  CREATININE 0.67 0.62 0.70    Estimated Creatinine Clearance: 88.9 mL/min (by C-G formula based on SCr of 0.7 mg/dL).   Medical History: Past Medical History:  Diagnosis Date   Abnormal EKG    Arthritis    Essential hypertension 01/02/2017   GERD (gastroesophageal reflux disease)    Gout    Hyperlipidemia 01/02/2017   Hypertension    Hypothyroidism    Peripheral vascular disease (HCC)    RBBB 01/02/2017   Rotator cuff tear    Sleep apnea      Assessment: 72 yoM s/p Lumbar wound dehiscence with wound drainage on 5/12. Neurosurgery has consulted pharmacy to enter VTE prophylaxis lovenox  to start 5/14.  Plan:  Lovenox  50mg  every day  Monitor for s/sx of bleeding  Mohammed Andrew, PharmD Clinical Pharmacist 12/26/2023 8:43 AM Please check AMION for all [facility] Pharmacy numbers

## 2023-12-26 NOTE — Progress Notes (Signed)
 PT Cancellation Note  Patient Details Name: Norman Lambert MRN: [id number] DOB: 05/05/1951   Cancelled Treatment:    Reason Eval/Treat Not Completed: (P) Pain limiting ability to participate, pt declining mobility stating increased pain despite pre-medication and endorsing fatigue from having just worked with OT. Pt agreeable for this PTA to return in AM.   Beverly Buckler. PTA Acute Rehabilitation Services Office: [phone]    Agapito Horseman 12/26/2023, 4:25 PM

## 2023-12-26 NOTE — Progress Notes (Signed)
 Occupational Therapy Treatment Patient Details Name: Norman Lambert MRN: [id number] DOB: 04-02-1951 Today's Date: 12/26/2023   History of present illness 73 yo male admitted 12/05/23 with LB and severe back pain. 12/08/23 redo of laminectomy L3-4 with posterior fixation L2-S1; recovery complicated by pain and hypotension; 5/2 hypotension and tachycardia. 5/3 transfers to ICU, pressors, L2-3 TVP fx. 5/5 VAC placed. PMHx: gout, arthritis, HTN, HLD, HTN, PVD, RBBB, Rt reverse TSA, sleep apnea, 11/13/23 PLIF L3-4   OT comments  Pt c/o pain to back, increases with activity, wife present during session. Pt lethargic at first, max verbal cues to stay awake and participate, increased time to initiate tasks. Pt mod-max A to assist with sidelying to sit EOB, max effort from Pt. Once sitting EOB Pt able to sit at CGA, occasional min A to maintain sitting balance. Pt did not want to try standing due to BLE weakness. Lateral scoot max-total A at bedside. Pt max A for return to supine and scooting back in bed. DC to postacute rehab <3hrs/day still recommended, will continue to see Pt acutely to progress as able.       If plan is discharge home, recommend the following:  Two people to help with walking and/or transfers;Two people to help with bathing/dressing/bathroom   Equipment Recommendations  None recommended by OT    Recommendations for Other Services      Precautions / Restrictions Precautions Precautions: Back;Fall;Other (comment) Precaution Booklet Issued: No Recall of Precautions/Restrictions: Intact Precaution/Restrictions Comments: recalls 3/3 back precautions, assist for brace Required Braces or Orthoses: Spinal Brace Spinal Brace: Lumbar corset;Applied in sitting position Other Brace: AFO for LLE (not donned this date) Restrictions Weight Bearing Restrictions Per Provider Order: No       Mobility Bed Mobility Overal bed mobility: Needs Assistance Bed Mobility: Sidelying to Sit, Sit to  Supine Rolling: Mod assist Sidelying to sit: Mod assist, HOB elevated, Used rails   Sit to supine: Max assist   General bed mobility comments: Pt mod-max A for log roll to EOB and back, increased time, max effort from Pt .    Transfers Overall transfer level: Needs assistance                 General transfer comment: did not attempt to stand, lateral scoot at bedside max-total A.     Balance Overall balance assessment: Needs assistance Sitting-balance support: No upper extremity supported, Feet supported Sitting balance-Leahy Scale: Fair Sitting balance - Comments: able to sit EOB once positioned with occasional support to maintain sitting balance                                   ADL either performed or assessed with clinical judgement   ADL Overall ADL's : Needs assistance/impaired Eating/Feeding: Set up;Sitting               Upper Body Dressing : Minimal assistance;Sitting                     General ADL Comments: Pt able to sit EOB don/doff lumbar corset with min A, back precautions, needs help with LB dressing from wife at baseline.    Extremity/Trunk Assessment Upper Extremity Assessment Upper Extremity Assessment: Generalized weakness            Vision       Perception     Praxis     Communication Communication Communication: No apparent difficulties  Cognition Arousal: Lethargic, Alert Behavior During Therapy: Flat affect Cognition: No apparent impairments                               Following commands: Impaired Following commands impaired: Follows one step commands with increased time      Cueing   Cueing Techniques: Verbal cues, Gestural cues, Tactile cues  Exercises      Shoulder Instructions       General Comments      Pertinent Vitals/ Pain       Pain Assessment Pain Assessment: Faces Faces Pain Scale: Hurts even more Pain Location: back Pain Descriptors / Indicators: Aching,  Guarding Pain Intervention(s): Monitored during session  Home Living                                          Prior Functioning/Environment              Frequency  Min 2X/week        Progress Toward Goals  OT Goals(current goals can now be found in the care plan section)  Progress towards OT goals: Progressing toward goals  Acute Rehab OT Goals Patient Stated Goal: to manage pain OT Goal Formulation: With patient Time For Goal Achievement: 01/09/24 Potential to Achieve Goals: Good ADL Goals Pt Will Perform Grooming: with set-up;sitting Pt Will Perform Upper Body Bathing: with set-up;sitting Pt Will Perform Lower Body Bathing: with min assist;sit to/from stand Pt Will Transfer to Toilet: with contact guard assist;ambulating Pt Will Perform Toileting - Clothing Manipulation and hygiene: with contact guard assist;sit to/from stand  Plan      Co-evaluation                 AM-PAC OT "6 Clicks" Daily Activity     Outcome Measure   Help from another person eating meals?: A Little Help from another person taking care of personal grooming?: A Little Help from another person toileting, which includes using toliet, bedpan, or urinal?: Total Help from another person bathing (including washing, rinsing, drying)?: A Lot Help from another person to put on and taking off regular upper body clothing?: A Lot Help from another person to put on and taking off regular lower body clothing?: Total 6 Click Score: 12    End of Session    OT Visit Diagnosis: Unsteadiness on feet (R26.81);Muscle weakness (generalized) (M62.81);Pain Pain - part of body:  (back)   Activity Tolerance Patient tolerated treatment well   Patient Left in bed;with call bell/phone within reach;with bed alarm set;with family/visitor present   Nurse Communication Mobility status        Time: 1610-9604 OT Time Calculation (min): 34 min  Charges: OT General Charges $OT Visit: 1  Visit OT Treatments $Therapeutic Activity: 23-37 mins  [address] OTR/L   Scherry Curtis 12/26/2023, 3:28 PM

## 2023-12-26 NOTE — Plan of Care (Signed)
  Problem: Education: Goal: Knowledge of General Education information will improve Description: Including pain rating scale, medication(s)/side effects and non-pharmacologic comfort measures Outcome: Progressing   Problem: Clinical Measurements: Goal: Ability to maintain clinical measurements within normal limits will improve Outcome: Progressing Goal: Diagnostic test results will improve Outcome: Progressing Goal: Respiratory complications will improve Outcome: Progressing Goal: Cardiovascular complication will be avoided Outcome: Progressing   Problem: Activity: Goal: Risk for activity intolerance will decrease Outcome: Progressing   Problem: Nutrition: Goal: Adequate nutrition will be maintained Outcome: Progressing   Problem: Elimination: Goal: Will not experience complications related to bowel motility Outcome: Progressing Goal: Will not experience complications related to urinary retention Outcome: Progressing   Problem: Pain Managment: Goal: General experience of comfort will improve and/or be controlled Outcome: Progressing   Problem: Safety: Goal: Ability to remain free from injury will improve Outcome: Progressing   Problem: Skin Integrity: Goal: Risk for impaired skin integrity will decrease Outcome: Progressing   Problem: Education: Goal: Knowledge of the prescribed therapeutic regimen will improve Outcome: Progressing   Problem: Activity: Goal: Ability to avoid complications of mobility impairment will improve Outcome: Progressing Goal: Ability to tolerate increased activity will improve Outcome: Progressing Goal: Will remain free from falls Outcome: Progressing   Problem: Bowel/Gastric: Goal: Gastrointestinal status for postoperative course will improve Outcome: Progressing   Problem: Clinical Measurements: Goal: Diagnostic test results will improve Outcome: Progressing   Problem: Pain Management: Goal: Pain level will decrease Outcome:  Progressing   Problem: Skin Integrity: Goal: Will show signs of wound healing Outcome: Progressing   Problem: Bladder/Genitourinary: Goal: Urinary functional status for postoperative course will improve Outcome: Progressing

## 2023-12-26 NOTE — TOC Progression Note (Signed)
 Transition of Care [facility]) - Progression Note    Patient Details  Name: Norman Lambert MRN: [id number] Date of Birth: 1951/08/14  [facility] CM/SW Contact  Valery Gaucher, [place] Phone Number: 12/26/2023, 2:11 PM  Clinical Narrative:     [facility]/ SNF - does not  have any beds at this time and not sure when they will have availability.   Called [facility] to review referral - left voice message.   Liddie Reel, MSW, LCSW Clinical Social Worker    Expected Discharge Plan: Home w Home Health Services Barriers to Discharge: Continued Medical Work up  Expected Discharge Plan and Services In-house Referral: Clinical Social Work   Post Acute Care Choice: Home Health Living arrangements for the past 2 months: Single Family Home                                       Social Determinants of Health (SDOH) Interventions SDOH Screenings   Food Insecurity: No Food Insecurity (12/06/2023)  Housing: Low Risk  (12/06/2023)  Transportation Needs: No Transportation Needs (12/06/2023)  Utilities: Not At Risk (12/06/2023)  Financial Resource Strain: Low Risk  (06/05/2023)   Received from [facility]  Physical Activity: Unknown (06/05/2023)   Received from [facility]  Social Connections: Moderately Isolated (12/11/2023)  Stress: No Stress Concern Present (08/21/2023)   Received from [facility]  Tobacco Use: Medium Risk (12/25/2023)    Readmission Risk Interventions     No data to display

## 2023-12-26 NOTE — Progress Notes (Signed)
 [facility]    Date of Admission:  12/05/2023   Total days of antibiotics 7   ID: Norman Lambert is a 73 y.o. male with  hx of lumbar fusion complicated by surgical site infection and wound dehiscence/ drainage, bacteremia with pseudomonas and proteus Principal Problem:   Back pain Active Problems:   S/P lumbar fusion   Septic shock (HCC)   Bacteremia due to Proteus species   Pressure injury of skin   Paroxysmal A-fib (HCC)   Acute postoperative anemia due to expected blood loss   Benign prostatic hyperplasia   Gastroesophageal reflux disease   Acute metabolic encephalopathy    Subjective: Afebrile - underwent I x D with wound vac placement yesterday.  He is tolerating cefepime  thus far  Medications:   vitamin C   1,000 mg Oral Daily   Chlorhexidine  Gluconate Cloth  6 each Topical Daily   cyanocobalamin   1,000 mcg Oral Daily   [START ON 12/27/2023] enoxaparin  (LOVENOX ) injection  50 mg Subcutaneous Daily   feeding supplement  237 mL Oral BID BM   finasteride   5 mg Oral Daily   folic acid   1 mg Oral Daily   gabapentin   300 mg Oral TID   insulin  aspart  0-5 Units Subcutaneous QHS   insulin  aspart  0-9 Units Subcutaneous TID WC   levothyroxine   112 mcg Oral Q0600   lidocaine   2 patch Transdermal Q24H   loratadine   10 mg Oral Daily   melatonin  3 mg Oral QHS   midodrine   10 mg Oral TID WC   modafinil   200 mg Oral Daily   oxyCODONE   20 mg Oral Q12H   pantoprazole   40 mg Oral Daily   polyethylene glycol  17 g Oral Daily   [START ON 12/27/2023] predniSONE   10 mg Oral Q breakfast   Followed by   Norman Lambert ON 01/03/2024] predniSONE   5 mg Oral Q breakfast   Followed by   Norman Lambert ON 01/10/2024] predniSONE   2.5 mg Oral Q breakfast   rosuvastatin   5 mg Oral Daily   senna  1 tablet Oral BID   sodium chloride  flush  3 mL Intravenous Q12H   sodium chloride  flush  3 mL Intravenous Q12H   sodium chloride  flush  3-10 mL Intravenous Q12H   zinc  sulfate (50mg   elemental zinc )  220 mg Oral Daily    Objective: Vital signs in last 24 hours: Temp:  [97.2 F (36.2 C)-98.1 F (36.7 C)] 97.7 F (36.5 C) (05/13 1547) Pulse Rate:  [63-84] 63 (05/13 1547) Resp:  [10-17] 12 (05/13 1547) BP: (112-162)/(46-80) 139/67 (05/13 1547) SpO2:  [93 %-100 %] 97 % (05/13 1547)  Physical Exam  Constitutional: He is oriented to person, place, and time. He appears well-developed and well-nourished. No distress.  HENT:  Mouth/Throat: Oropharynx is clear and moist. No oropharyngeal exudate.  Cardiovascular: Normal rate, regular rhythm and normal heart sounds. Exam reveals no gallop and no friction rub.  No murmur heard.  Pulmonary/Chest: Effort normal and breath sounds normal. No respiratory distress. He has no wheezes.  Abdominal: Soft. Bowel sounds are normal. He exhibits no distension. There is no tenderness.  Back =wound vac in place Neurological: He is alert and oriented to person, place, and time.  Skin: Skin is warm and dry. No rash noted. No erythema.  Psychiatric: He has a normal mood and affect. His behavior is normal.   Lab Results Recent Labs    12/25/23 0358 12/26/23 0431  WBC 10.1 13.3*  HGB 8.3* 7.9*  HCT 26.4* 24.9*  NA 136 136  K 4.0 4.6  CL 103 103  CO2 22 23  BUN 13 15  CREATININE 0.62 0.70   Lab Results  Component Value Date   ESRSEDRATE 38 (H) 12/05/2023   Lab Results  Component Value Date   CRP 20.0 (H) 12/16/2023    Microbiology:    Component Ref Range & Units (hover) 10 d ago  Specimen Description WOUND  Special Requests NONE  Gram Stain RARE SQUAMOUS EPITHELIAL CELLS PRESENT RARE WBC PRESENT, PREDOMINANTLY PMN RARE GRAM NEGATIVE RODS  Culture FEW KLEBSIELLA AEROGENES FEW PROTEUS MIRABILIS FEW DIPHTHEROIDS(CORYNEBACTERIUM SPECIES) Standardized susceptibility testing for this organism is not available. NO ANAEROBES ISOLATED Performed at [facility], [address] [place], [place] [address]  Report  Status 12/21/2023 FINAL  Organism ID, Bacteria KLEBSIELLA AEROGENES  Organism ID, Bacteria PROTEUS MIRABILIS  Resulting Agency [facility]     Susceptibility   Klebsiella aerogenes Proteus mirabilis    MIC MIC    AMPICILLIN   <=2 SENSITIVE Sensitive    AMPICILLIN/SULBACTAM   <=2 SENSITIVE Sensitive    CEFEPIME  <=0.12 SENS... Sensitive <=0.12 SENS... Sensitive    CEFTAZIDIME <=1 SENSITIVE Sensitive <=1 SENSITIVE Sensitive    CEFTRIAXONE  <=0.25 SENS... Sensitive <=0.25 SENS... Sensitive    CIPROFLOXACIN <=0.25 SENS... Sensitive <=0.25 SENS... Sensitive    GENTAMICIN <=1 SENSITIVE Sensitive <=1 SENSITIVE Sensitive    IMIPENEM 1 SENSITIVE Sensitive 2 SENSITIVE Sensitive    PIP/TAZO <=4 SENSITI... Sensitive <=4 SENSITI... Sensitive    TRIMETH/SULFA <=20 SENSIT... Sensitive <=20 SENSIT... Sensitive      Proteus blood cx 5/3 Studies/Results: CT LUMBAR SPINE WO CONTRAST Result Date: 12/26/2023 CLINICAL DATA:  73 year old male postoperative day 1 surgery for lumbar wound dehiscence, wound drainage. EXAM: CT LUMBAR SPINE WITHOUT CONTRAST TECHNIQUE: Multidetector CT imaging of the lumbar spine was performed without intravenous contrast administration. Multiplanar CT image reconstructions were also generated. RADIATION DOSE REDUCTION: This exam was performed according to the departmental dose-optimization program which includes automated exposure control, adjustment of the mA and/or kV according to patient size and/or use of iterative reconstruction technique. COMPARISON:  Postoperative lumbar spine CT 12/16/2023, previous lumbar CT 12/06/2023. FINDINGS: Segmentation: Normal, the same numbering system use recently. Alignment: Stable. Mild levoconvex lumbar scoliosis with mild grade 1 anterolisthesis of L5 on S1 and otherwise mild straightening of lumbar lordosis. Vertebrae: Postoperative changes, spine hardware and other postoperative findings are described below. Pre-existing L4 superior vertebral body  fracture along the course of the right L4 pedicle screw, and similar nondisplaced fracture of the left L4 transverse process stable since 12/06/2023. Since 12/16/2023 there is a new nondisplaced fracture of the left L5 pedicle (series 4, image 102 and sagittal image 72) superimposed on chronic left L5 pars fracture which is stable. Contralateral right L5 posterior element fracture along the lateral margin of the pedicle (series 4, image 106) is stable since 12/16/2023, new since 11/16/2023. See also coronal image 57. Nondisplaced left superior sacral ala fracture on series 4, image 113 is stable since 12/16/2023, also new since 12/06/2023. Visible SI joints remain aligned. Sacrum elsewhere appears to remain intact. Paraspinal and other soft tissues: New postoperative changes to the paraspinal soft tissues with 2 postoperative drains in place tracking along the posterior elements of L2 through L5. superimposed postoperative bone graft material and small volume of postoperative soft tissue gas. No organized or drainable fluid collection is evident. Superimposed Aortoiliac calcified atherosclerosis. Stable visible  abdominal viscera. Increased retained stool in the visible rectosigmoid colon. Disc levels: T11-T12: Interbody ankylosis from right far lateral flowing endplate osteophyte. T12-L1:  Stable.  Anterior disc osteophyte degeneration. L1-L2:  Stable.  Mostly anterior disc bulging and endplate spurring. L2-L3: Bilateral L2 and L3 pedicle screws are stable since 12/16/2023. No interbody implant. Stable interbody vacuum phenomena. No convincing fracture along this hardware. L3-L4: Stable removal of the left L4 pedicle screw since 12/16/2023. Interbody implant here with stable endplate subsidence. Stable right L4 superior endplate fracture since last month. Posterior decompression. L4-L5: L4 hardware detailed above and L5 hardware detailed below. No interbody implant. Stable vacuum disc. Stable left laminectomy.  L5-S1: Left L5 transpedicular and right lateral marginal pedicle fractures as above. These are along the course of the bilateral L5 screws. The L5 hardware itself appears stable. Interbody implant appears stable. S1 pedicle screws appear stable with stable left superior sacral ala fracture (coronal image 52). IMPRESSION: 1. Posterior paraspinal postoperative drains now in place, with recent postoperative soft tissue gas. No discrete soft tissue or bony changes of Infection by CT. 2. However, new Left L5 nondisplaced pedicle fracture since 12/16/2023, which is superimposed on stable minimally displaced fractures along the lateral margin of the right L5 pedicle and the left S1 superior sacral ala (along the right L5 and left S1 screws, respectively). 3. Stable nondisplaced fracture of the right superior L4 vertebral body contiguous with the right L4 pedicle screw since last month. Stable previously removed left L4 pedicle screw. And stable other spinal hardware. 4. Aortic Atherosclerosis (ICD10-I70.0). Increased rectosigmoid retained stool. Electronically Signed   By: Marlise Simpers M.D.   On: 12/26/2023 13:19   US  EKG SITE RITE Result Date: 12/26/2023 If Site Rite image not attached, placement could not be confirmed due to current cardiac rhythm.    Assessment/Plan: Polymicrobial surgical site infection 2/2 wound dehiscence from posterior lumbar fusion = continue with cefepime  to cover proteus and pseudomonas. Will add daptomycin for corynebacterium. Will use 5/12 as day 1 of abtx of 6 wk course  Will get picc line placed.  Proteus bacteremia = is secondary to surgical site infection  Acute on chronic back pain = defer to primary team  [facility] Pager: [phone]  12/26/2023, 3:55 PM

## 2023-12-26 NOTE — Progress Notes (Signed)
 Patient ID: Norman Lambert, male   DOB: 07-17-1951, 73 y.o.   MRN: [id number] He is a little sore today as expected.  No leg pain.  Hemovac in place.  External negative pressure wound dressing in place.  I explained to him that we found necrotic tissue and serosanguineous fluid and this was all washed out.  However, there was some question of slight loosening of the left L5 and S1 screws.  Therefore, we will get a CT scan of the lumbar spine in the next day or 2 and try to look at the construct.  Will start Lovenox  back tomorrow for DVT prophylaxis.  Continue IV antibiotics per infectious disease.

## 2023-12-27 DIAGNOSIS — Z981 Arthrodesis status: Secondary | ICD-10-CM | POA: Diagnosis not present

## 2023-12-27 DIAGNOSIS — R7881 Bacteremia: Secondary | ICD-10-CM | POA: Diagnosis not present

## 2023-12-27 DIAGNOSIS — T8149XA Infection following a procedure, other surgical site, initial encounter: Secondary | ICD-10-CM | POA: Diagnosis not present

## 2023-12-27 DIAGNOSIS — B964 Proteus (mirabilis) (morganii) as the cause of diseases classified elsewhere: Secondary | ICD-10-CM | POA: Diagnosis not present

## 2023-12-27 DIAGNOSIS — M5440 Lumbago with sciatica, unspecified side: Secondary | ICD-10-CM | POA: Diagnosis not present

## 2023-12-27 DIAGNOSIS — A499 Bacterial infection, unspecified: Secondary | ICD-10-CM

## 2023-12-27 LAB — GLUCOSE, CAPILLARY
Glucose-Capillary: 96 mg/dL (ref 70–99)
Glucose-Capillary: 80 mg/dL (ref 70–99)
Glucose-Capillary: 112 mg/dL — ABNORMAL HIGH (ref 70–99)
Glucose-Capillary: 97 mg/dL (ref 70–99)

## 2023-12-27 LAB — BASIC METABOLIC PANEL WITH GFR
Anion gap: 4 — ABNORMAL LOW (ref 5–15)
BUN: 15 mg/dL (ref 8–23)
CO2: 27 mmol/L (ref 22–32)
Calcium: 8.2 mg/dL — ABNORMAL LOW (ref 8.9–10.3)
Chloride: 105 mmol/L (ref 98–111)
Creatinine, Ser: 0.79 mg/dL (ref 0.61–1.24)
GFR, Estimated: >60 mL/min (ref >60)
Glucose, Bld: 87 mg/dL (ref 70–99)
Potassium: 4 mmol/L (ref 3.5–5.1)
Sodium: 136 mmol/L (ref 135–145)

## 2023-12-27 LAB — CBC
HCT: 23.4 % — ABNORMAL LOW (ref 39.0–52.0)
Hemoglobin: 7.4 g/dL — ABNORMAL LOW (ref 13.0–17.0)
MCH: 30.2 pg (ref 26.0–34.0)
MCHC: 31.6 g/dL (ref 30.0–36.0)
MCV: 95.5 fL (ref 80.0–100.0)
Platelets: 243 10*3/uL (ref 150–400)
RBC: 2.45 MIL/uL — ABNORMAL LOW (ref 4.22–5.81)
RDW: 13.8 % (ref 11.5–15.5)
WBC: 8.7 10*3/uL (ref 4.0–10.5)
nRBC: 0 % (ref 0.0–0.2)

## 2023-12-27 MED ORDER — DAPTOMYCIN-SODIUM CHLORIDE 700-0.9 MG/100ML-% IV SOLN
8.0000 mg/kg | Freq: Every day | INTRAVENOUS | Status: DC
  Administered 2023-12-27 – 2024-01-14 (×19): 700 mg via INTRAVENOUS
  Filled 2023-12-27 (×21): qty 100

## 2023-12-27 NOTE — Progress Notes (Signed)
 Subjective: Patient reports still having a lot of back pain, sat on the side of the bed with therapy yesterday and today.   Objective: Vital signs in last 24 hours: Temp:  [97.7 F (36.5 C)-98.6 F (37 C)] 98.3 F (36.8 C) (05/14 0725) Pulse Rate:  [59-76] 67 (05/14 0725) Resp:  [9-20] 19 (05/14 0725) BP: (97-139)/(46-67) 97/56 (05/14 0725) SpO2:  [89 %-98 %] 95 % (05/14 0725)  Intake/Output from previous day: 05/13 0701 - 05/14 0700 In: 837 [P.O.:837] Out: 2060 [Urine:2000; Drains:60] Intake/Output this shift: Total I/O In: -  Out: 60 [Drains:60]  Neurologic: Grossly normal  Lab Results: Lab Results  Component Value Date   WBC 8.7 12/27/2023   HGB 7.4 (L) 12/27/2023   HCT 23.4 (L) 12/27/2023   MCV 95.5 12/27/2023   PLT 243 12/27/2023   Lab Results  Component Value Date   INR 1.1 12/21/2023   BMET Lab Results  Component Value Date   NA 136 12/27/2023   K 4.0 12/27/2023   CL 105 12/27/2023   CO2 27 12/27/2023   GLUCOSE 87 12/27/2023   BUN 15 12/27/2023   CREATININE 0.79 12/27/2023   CALCIUM  8.2 (L) 12/27/2023    Studies/Results: CT LUMBAR SPINE WO CONTRAST Result Date: 12/26/2023 CLINICAL DATA:  73 year old male postoperative day 1 surgery for lumbar wound dehiscence, wound drainage. EXAM: CT LUMBAR SPINE WITHOUT CONTRAST TECHNIQUE: Multidetector CT imaging of the lumbar spine was performed without intravenous contrast administration. Multiplanar CT image reconstructions were also generated. RADIATION DOSE REDUCTION: This exam was performed according to the departmental dose-optimization program which includes automated exposure control, adjustment of the mA and/or kV according to patient size and/or use of iterative reconstruction technique. COMPARISON:  Postoperative lumbar spine CT 12/16/2023, previous lumbar CT 12/06/2023. FINDINGS: Segmentation: Normal, the same numbering system use recently. Alignment: Stable. Mild levoconvex lumbar scoliosis with mild grade  1 anterolisthesis of L5 on S1 and otherwise mild straightening of lumbar lordosis. Vertebrae: Postoperative changes, spine hardware and other postoperative findings are described below. Pre-existing L4 superior vertebral body fracture along the course of the right L4 pedicle screw, and similar nondisplaced fracture of the left L4 transverse process stable since 12/06/2023. Since 12/16/2023 there is a new nondisplaced fracture of the left L5 pedicle (series 4, image 102 and sagittal image 72) superimposed on chronic left L5 pars fracture which is stable. Contralateral right L5 posterior element fracture along the lateral margin of the pedicle (series 4, image 106) is stable since 12/16/2023, new since 11/16/2023. See also coronal image 57. Nondisplaced left superior sacral ala fracture on series 4, image 113 is stable since 12/16/2023, also new since 12/06/2023. Visible SI joints remain aligned. Sacrum elsewhere appears to remain intact. Paraspinal and other soft tissues: New postoperative changes to the paraspinal soft tissues with 2 postoperative drains in place tracking along the posterior elements of L2 through L5. superimposed postoperative bone graft material and small volume of postoperative soft tissue gas. No organized or drainable fluid collection is evident. Superimposed Aortoiliac calcified atherosclerosis. Stable visible abdominal viscera. Increased retained stool in the visible rectosigmoid colon. Disc levels: T11-T12: Interbody ankylosis from right far lateral flowing endplate osteophyte. T12-L1:  Stable.  Anterior disc osteophyte degeneration. L1-L2:  Stable.  Mostly anterior disc bulging and endplate spurring. L2-L3: Bilateral L2 and L3 pedicle screws are stable since 12/16/2023. No interbody implant. Stable interbody vacuum phenomena. No convincing fracture along this hardware. L3-L4: Stable removal of the left L4 pedicle screw since 12/16/2023. Interbody implant here with  stable endplate  subsidence. Stable right L4 superior endplate fracture since last month. Posterior decompression. L4-L5: L4 hardware detailed above and L5 hardware detailed below. No interbody implant. Stable vacuum disc. Stable left laminectomy. L5-S1: Left L5 transpedicular and right lateral marginal pedicle fractures as above. These are along the course of the bilateral L5 screws. The L5 hardware itself appears stable. Interbody implant appears stable. S1 pedicle screws appear stable with stable left superior sacral ala fracture (coronal image 52). IMPRESSION: 1. Posterior paraspinal postoperative drains now in place, with recent postoperative soft tissue gas. No discrete soft tissue or bony changes of Infection by CT. 2. However, new Left L5 nondisplaced pedicle fracture since 12/16/2023, which is superimposed on stable minimally displaced fractures along the lateral margin of the right L5 pedicle and the left S1 superior sacral ala (along the right L5 and left S1 screws, respectively). 3. Stable nondisplaced fracture of the right superior L4 vertebral body contiguous with the right L4 pedicle screw since last month. Stable previously removed left L4 pedicle screw. And stable other spinal hardware. 4. Aortic Atherosclerosis (ICD10-I70.0). Increased rectosigmoid retained stool. Electronically Signed   By: Marlise Simpers M.D.   On: 12/26/2023 13:19   US  EKG SITE RITE Result Date: 12/26/2023 If Site Rite image not attached, placement could not be confirmed due to current cardiac rhythm.   Assessment/Plan: CT lumbar reviewed which shows new left pedicle fracture and lateral margin of the right L5 pedicle. And the left S1 sacral ala. This is an unfortunate situation. I really cannot explain why he keeps fracturing around his hardware when he has not even been loading his spine by walking or sitting up for an extended period of time. Continue hemovac and external wound vac for now. I'm not sure more hardware is the answer at this  point since he keeps failing these surgeries with very minimal movement.    LOS: 22 days    Norman Lambert 12/27/2023, 11:00 AM

## 2023-12-27 NOTE — Progress Notes (Signed)
 Physical Therapy Treatment Patient Details Name: Norman Lambert MRN: [id number] DOB: Oct 23, 1950 Today's Date: 12/27/2023   History of Present Illness 73 yo male admitted 12/05/23 with LB and severe back pain. 12/08/23 redo of laminectomy L3-4 with posterior fixation L2-S1; recovery complicated by pain and hypotension; 5/2 hypotension and tachycardia. 5/3 transfers to ICU, pressors, L2-3 TVP fx. 5/5 VAC placed. PMHx: gout, arthritis, HTN, HLD, HTN, PVD, RBBB, Rt reverse TSA, sleep apnea, 11/13/23 PLIF L3-4    PT Comments  Pt resting in bed on arrival and agreeable to session with steady progress towards acute goals, however continues to be limited by weakness, pain, and poor balance/postural reactions. Pt demonstrating good adherence to all precautions throughout mobility with light cues. Pt requiring mod A to come to sitting EOB and mod A +2 to boost to stand with RW for support x3 trials. Pt unable to progress stepping in standing, able to step RLE but unable to lift LLE from floor, pt able to march LLE in sitting. Encouraged pt to elevate HOB as tolerated throughout day for increased cardiopulmonary compliance and acclimation to upright with pt agreeable to 30 degrees at end of session. Pt continues to benefit from skilled PT services to progress toward functional mobility goals.      If plan is discharge home, recommend the following: A lot of help with bathing/dressing/bathroom;Two people to help with walking and/or transfers;Assistance with cooking/housework;Assist for transportation   Can travel by private vehicle     No  Equipment Recommendations  Rolling walker (2 wheels);BSC/3in1;Hospital bed    Recommendations for Other Services       Precautions / Restrictions Precautions Precautions: Back;Fall;Other (comment) Precaution Booklet Issued: No Recall of Precautions/Restrictions: Intact Precaution/Restrictions Comments: recalls 3/3 back precautions, assist for brace Required Braces or  Orthoses: Spinal Brace Spinal Brace: Lumbar corset;Applied in sitting position Other Brace: AFO for LLE (not donned this date) Restrictions Weight Bearing Restrictions Per Provider Order: No     Mobility  Bed Mobility Overal bed mobility: Needs Assistance Bed Mobility: Sidelying to Sit, Sit to Sidelying Rolling: Mod assist Sidelying to sit: Mod assist, HOB elevated, Used rails     Sit to sidelying: Min assist General bed mobility comments: mod A to roll to L, bring LEs off EOB and elevate trunk, min A to return LEs to bed at end of session with pt able to lower trunk to sidelying and roll to supine without assist    Transfers Overall transfer level: Needs assistance Equipment used: Rolling walker (2 wheels) Transfers: Sit to/from Stand Sit to Stand: From elevated surface, +2 physical assistance, Mod assist           General transfer comment: mod A +2 to boost to stand with LLE blocked as for sliding anterior and with pt adduction L leg with L foot almost on top of R on last stand, able to stand x3    Ambulation/Gait             Pre-gait activities: attempting to step feet in standing with pt able to step R, but unable to lift L General Gait Details: unable to attempt this date   Stairs             Wheelchair Mobility     Tilt Bed    Modified Rankin (Stroke Patients Only)       Balance Overall balance assessment: Needs assistance Sitting-balance support: No upper extremity supported, Feet supported Sitting balance-Leahy Scale: Fair Sitting balance - Comments: able to sit  EOB once positioned with occasional support to maintain sitting balance   Standing balance support: Bilateral upper extremity supported, During functional activity, Reliant on assistive device for balance Standing balance-Leahy Scale: Poor Standing balance comment: reliant on RW                            Communication Communication Communication: No apparent  difficulties  Cognition Arousal: Lethargic, Alert Behavior During Therapy: Flat affect   PT - Cognitive impairments: Memory, Problem solving, Safety/Judgement                       PT - Cognition Comments: able to state precautions and required mod multimodal cues to follow commands Following commands: Impaired Following commands impaired: Follows one step commands with increased time    Cueing Cueing Techniques: Verbal cues, Gestural cues, Tactile cues  Exercises General Exercises - Lower Extremity Long Arc Quad: AROM, Right, Left, 5 reps, Seated Hip Flexion/Marching: AROM, Both, Seated, 5 reps    General Comments General comments (skin integrity, edema, etc.): VSS on RA      Pertinent Vitals/Pain Pain Assessment Pain Assessment: Faces Faces Pain Scale: Hurts little more Pain Location: back Pain Descriptors / Indicators: Aching, Guarding Pain Intervention(s): Premedicated before session, Monitored during session, Limited activity within patient's tolerance    Home Living                          Prior Function            PT Goals (current goals can now be found in the care plan section) Acute Rehab PT Goals PT Goal Formulation: With patient/family Time For Goal Achievement: 12/24/23 Progress towards PT goals: Progressing toward goals    Frequency    Min 2X/week      PT Plan      Co-evaluation              AM-PAC PT "6 Clicks" Mobility   Outcome Measure  Help needed turning from your back to your side while in a flat bed without using bedrails?: A Lot Help needed moving from lying on your back to sitting on the side of a flat bed without using bedrails?: A Lot Help needed moving to and from a bed to a chair (including a wheelchair)?: Total Help needed standing up from a chair using your arms (e.g., wheelchair or bedside chair)?: Total Help needed to walk in hospital room?: Total Help needed climbing 3-5 steps with a railing? :  Total 6 Click Score: 8    End of Session Equipment Utilized During Treatment: Back brace;Gait belt Activity Tolerance: Patient tolerated treatment well;Patient limited by fatigue Patient left: in bed;with call bell/phone within reach;with family/visitor present;with bed alarm set Nurse Communication: Mobility status PT Visit Diagnosis: Unsteadiness on feet (R26.81);History of falling (Z91.81);Muscle weakness (generalized) (M62.81);Pain Pain - part of body:  (back)     Time: 1308-6578 PT Time Calculation (min) (ACUTE ONLY): 25 min  Charges:    $Therapeutic Activity: 23-37 mins PT General Charges $$ ACUTE PT VISIT: 1 Visit                     Norman Lambert R. PTA Acute Rehabilitation Services Office: [phone]   Agapito Horseman 12/27/2023, 10:35 AM

## 2023-12-27 NOTE — Plan of Care (Signed)
   Problem: Education: Goal: Knowledge of General Education information will improve Description Including pain rating scale, medication(s)/side effects and non-pharmacologic comfort measures Outcome: Progressing

## 2023-12-27 NOTE — Progress Notes (Signed)
 Patient ID: [place]BRASK[place] STEFFY, male   DOB: [birth year], 73 y.o.   MRN: [id number] CT scan of the lumbar spine reviewed.  Unfortunately he continues to suffer fractures despite immobility.  Now he has a fracture of the left sacral ala that is near the screw but not related to the screw.  There is a fracture of the left L5 pedicle.  I am not sure how he continues to fracture his lumbar spine without falls.  I know he has been on prednisone  for many years and he likely has fragility and osteoporosis, but this is a perplexing and difficult issue.  The most mobility he has had since surgery is sitting up on the side of the bed.  Yet he has suffered 2 more fractures of the lumbar spine.  Further mobilization would likely lead to further fractures.  Very difficult situation.  I am not sure bracing offers much in the way of improving the risk of further fragility fractures.  Certainly the morbidity mortality associated with fragility fractures, especially multiple fragility fractures after multiple surgeries, is worrisome to me.  For now, ongoing to cancel therapy and just let him lay in bed and try to recover for a while.  I am not sure we have any really good options here.  I do not think that further surgery with further lengthening of the construct down the S2 offers good.  I suspect that would lead to more complications.

## 2023-12-27 NOTE — TOC Progression Note (Addendum)
 Transition of Care [facility]) - Progression Note    Patient Details  Name: RICKSON PULLIAN MRN: [id number] Date of Birth: 14-Jan-1951  [facility] CM/SW Contact  Valery Gaucher, [place] Phone Number: 12/27/2023, 1:24 PM  Clinical Narrative:     CSW spoke with patient's spouse,Carol. She requested CSW send referral [facility]( and others near their home)- called [facility], left voice message with Lavella Poser, requesting call back.  Contacted Jennifer w/ [facility], she requested to email referral- email sent  Contacted Crystal w/ [facility]- faxed referral - waiting on review   TOC  will continue to follow an assist with discharge planning.  Liddie Reel, MSW, LCSW Clinical Social Worker    Expected Discharge Plan: Skilled Nursing Facility Barriers to Discharge: Continued Medical Work up  Expected Discharge Plan and Services In-house Referral: Clinical Social Work   Post Acute Care Choice: Home Health Living arrangements for the past 2 months: Single Family Home                                       Social Determinants of Health (SDOH) Interventions SDOH Screenings   Food Insecurity: No Food Insecurity (12/06/2023)  Housing: Low Risk  (12/06/2023)  Transportation Needs: No Transportation Needs (12/06/2023)  Utilities: Not At Risk (12/06/2023)  Financial Resource Strain: Low Risk  (06/05/2023)   Received from [facility]  Physical Activity: Unknown (06/05/2023)   Received from [facility]  Social Connections: Moderately Isolated (12/11/2023)  Stress: No Stress Concern Present (08/21/2023)   Received from [facility]  Tobacco Use: Medium Risk (12/25/2023)    Readmission Risk Interventions     No data to display

## 2023-12-27 NOTE — Plan of Care (Signed)
  Problem: Education: Goal: Knowledge of General Education information will improve Description: Including pain rating scale, medication(s)/side effects and non-pharmacologic comfort measures 12/27/2023 1852 by Corlis Dienes, RN Outcome: Progressing 12/27/2023 1622 by Corlis Dienes, RN Outcome: Progressing

## 2023-12-27 NOTE — Progress Notes (Signed)
 PROGRESS NOTE  Norman Lambert ZOX:[id number] DOB: 1951/07/05 DOA: 12/05/2023 PCP: Patient, No Pcp Per   LOS: 22 days   Brief narrative:  73 year old male with past medical history of hypertension, gout, hyperlipidemia, hypothyroidism, sleep apnea was admitted on 4/25 for reduction and internal fixation of previous fusions involving L2-S1. Postoperative course initially seemed unremarkable but subsequently he continued to have increased pain involving the left foot seemingly not responding to as needed narcotics. Imaging of the back was unremarkable, he continued to have drain VAC in place.  Subsequently on 12/15/2023 patient had hypotension with tachycardia and fever up to 103 F.  Patient was then started on IV cefepime  and vancomycin  with concern about sepsis.  There was significant wound erythema with serosanguineous foul-smelling drainage from the surgical site.  Patient had Levophed  briefly but subsequently was weaned off    Significant hospital events 4/25 Extensive L2-S1 lumbar surgery 4/30 New onset pain  5/1 Ongoing pain with borderline blood pressure 5/2 Pain, leukocytosis fever, 5/3 Hypotension, not responding to crystalloid started on broad-spectrum antibiotics wound assessed by surgical team culture sent imaging pending ICU transfer. CT imaging of lumbar spine was negative for definitive abscess there were stable fractures at L3 and L4 with possible new fracture involving the transverse process of L2 and L3 on the left postoperative changes noted.CT angio of chest was negative for pulmonary emboli had bibasilar airspace disease the right was a little worse than the left consistent with possible pneumonia there was aortic aneurysmal dilation of the ascending aorta measuring at 4.5 cm 5/4 Weaned off norepinephrine , Blood cultures from 1 out of 2 samples growing gram-negative rods with BC ID showing both proteus and Enterobacterales, MRSA PCR pending. Narrowed to ceftriaxone . 5/5 Remains on  low dose NE. Febrile. BCx growing proteus.  5/6 Wound Cx with proteus, klebsiella aerogenes. Abx re-broadened to cefepime . 5/8 Required low dose norepi overnight but off this AM. His wound vac was changed this AM and has been having some pain 5/9 no issues overnight, denies any acute pain this a.m. 5/14-continues to have back pain.     Assessment/Plan: Principal Problem:   Back pain Active Problems:   S/P lumbar fusion   Septic shock (HCC)   Bacteremia due to Proteus species   Pressure injury of skin   Paroxysmal A-fib (HCC)   Acute postoperative anemia due to expected blood loss   Benign prostatic hyperplasia   Gastroesophageal reflux disease   Acute metabolic encephalopathy  Septic shock secondary to Proteus bacteremia likely from postsurgical infection/possible pneumonia. -Surgical wound cultures growing Klebsiella aerogenes and Proteus Mirabilis. Blood cultures from 12/16/2023 with Proteus Mirabilis.  Neurosurgery on board and noted dehiscence of the surgical site.  Continue cefepime  and daptomycin..  On midodrine .  Continue steroid taper.  ID following.  Continue midodrine .  VAC management as per neurosurgery.  Follow ID recommendation.    Status post redo of laminectomy L3-4 with posterior fixation L2-S1 4/25 Continue PT OT.  Pain management.  Repeat CT scan of the lumbar spine done 12/26/2023 shows fracture of the L5 pedicle.  Likely fragility fractures from osteoporosis from long-term steroids.  Physical therapy on board.   Paroxysmal A-fib with RVR CHA2DS2VASC score 3. In normal sinus rhythm at this time.  Asymptomatic.  Review of 2D echocardiogram with EF of 55 to 60%.  Currently on Lovenox .   Hyperlipidemia - Continue statin.   Essential hypertension On midodrine  due to low blood pressure.  Latest blood pressure of 97/56.   Peripheral vascular disease  Continue statins.   Acute metabolic encephalopathy secondary to sepsis Improving.  Continue Delirium precautions.  Ensure adequate sleep-wake cycle. Continue Provigil . Continue nutrition and bowel regimen.   Postop acute blood loss anemia -Hemoglobin currently 7.4 from 7.9.  Transfuse for hemoglobin less than 7.    BPH -Continue Flomax , finasteride .   GERD Continue PPI.   Hypothyroidism Continue Synthroid .   Hyperglycemia in the setting of steroids - Continue sliding scale insulin .  Latest POC glucose of 80.  Pressure injuries, not POA, buttocks stage II, deep tissue pressure injury of the left toe, right heel deep tissue injury.  Continue wound care. Pressure Injury 12/16/23 Buttocks Stage 2 -  Partial thickness loss of dermis presenting as a shallow open injury with a red, pink wound bed without slough. (Active)  12/16/23 1300  Location: Buttocks  Location Orientation:   Staging: Stage 2 -  Partial thickness loss of dermis presenting as a shallow open injury with a red, pink wound bed without slough.  Wound Description (Comments):   Present on Admission:      Pressure Injury 12/16/23 Toe (Comment  which one) Anterior;Left Deep Tissue Pressure Injury - Purple or maroon localized area of discolored intact skin or blood-filled blister due to damage of underlying soft tissue from pressure and/or shear. (Active)  12/16/23 1300  Location: Toe (Comment  which one)  Location Orientation: Anterior;Left  Staging: Deep Tissue Pressure Injury - Purple or maroon localized area of discolored intact skin or blood-filled blister due to damage of underlying soft tissue from pressure and/or shear.  Wound Description (Comments):   Present on Admission:      Pressure Injury 12/20/23 Heel Right Deep Tissue Pressure Injury - Purple or maroon localized area of discolored intact skin or blood-filled blister due to damage of underlying soft tissue from pressure and/or shear. black (Active)  12/20/23 2000  Location: Heel  Location Orientation: Right  Staging: Deep Tissue Pressure Injury - Purple or maroon localized  area of discolored intact skin or blood-filled blister due to damage of underlying soft tissue from pressure and/or shear.  Wound Description (Comments): black  Present on Admission: No    DVT prophylaxis: lovenox  subcu   Disposition: As per primary team.  Likely to skilled nursing facility.  Status is: Inpatient   Code Status:     Code Status: Full Code  Family Communication: Spoke with wife at bedside   Procedures: Redo decompressive laminectomy L3-4 for recurrent stenosis with removal of epidural bone graft material/posterior fixation L2-S1 inclusive using Alphatec cortical pedicle screw/intertransverse arthrodesis L2-S1 inclusive using morselized autograft and allograft/exploration of fusion L3-4 with removal of instrumentation per Neurosurgery: Dr. Rochelle Chu 12/08/2023 2D echo 12/18/2023  Anti-infectives:  Daptomycin and cefepime   Anti-infectives (From admission, onward)    Start     Dose/Rate Route Frequency Ordered Stop   12/27/23 1100  DAPTOmycin (CUBICIN) IVPB 700 mg/177mL premix        8 mg/kg  92.1 kg 200 mL/hr over 30 Minutes Intravenous Daily 12/27/23 0824     12/19/23 1300  ceFEPIme  (MAXIPIME ) 2 g in sodium chloride  0.9 % 100 mL IVPB        2 g 200 mL/hr over 30 Minutes Intravenous Every 8 hours 12/19/23 1116     12/17/23 1000  vancomycin  (VANCOREADY) IVPB 1500 mg/300 mL  Status:  Discontinued       Placed in "Followed by" Linked Group   1,500 mg 150 mL/hr over 120 Minutes Intravenous Every 24 hours 12/16/23 0920 12/17/23 1009  12/17/23 1000  cefTRIAXone  (ROCEPHIN ) 2 g in sodium chloride  0.9 % 100 mL IVPB  Status:  Discontinued        2 g 200 mL/hr over 30 Minutes Intravenous Every 24 hours 12/17/23 0159 12/19/23 1116   12/16/23 1030  vancomycin  (VANCOREADY) IVPB 2000 mg/400 mL       Placed in "Followed by" Linked Group   2,000 mg 200 mL/hr over 120 Minutes Intravenous  Once 12/16/23 0920 12/16/23 1359   12/16/23 1015  ceFEPIme  (MAXIPIME ) 2 g in sodium chloride   0.9 % 100 mL IVPB  Status:  Discontinued        2 g 200 mL/hr over 30 Minutes Intravenous Every 8 hours 12/16/23 0920 12/17/23 0158   12/08/23 1900  ceFAZolin  (ANCEF ) IVPB 2g/100 mL premix        2 g 200 mL/hr over 30 Minutes Intravenous Every 8 hours 12/08/23 1721 12/09/23 0452   12/08/23 1015  ceFAZolin  (ANCEF ) IVPB 2g/100 mL premix        2 g 200 mL/hr over 30 Minutes Intravenous On call to O.R. 12/08/23 1000 12/08/23 1105   12/08/23 0952  ceFAZolin  (ANCEF ) 2-4 GM/100ML-% IVPB       Note to Pharmacy: Noelle Batten M: cabinet override      12/08/23 0952 12/08/23 1105      Subjective: Today, patient was seen and examined at bedside.  Complains of mild cough and shaking of the upper extremities.  Has  weakness of the lower extremity.  Has had bowel movement.  Patient's spouse at bedside.  Still complains of back pain.  Objective: Vitals:   12/27/23 0400 12/27/23 0725  BP: (!) 115/50 (!) 97/56  Pulse: 73 67  Resp: 20 19  Temp: 97.7 F (36.5 C) 98.3 F (36.8 C)  SpO2: 98% 95%    Intake/Output Summary (Last 24 hours) at 12/27/2023 1619 Last data filed at 12/27/2023 0800 Gross per 24 hour  Intake 120 ml  Output 1420 ml  Net -1300 ml   Filed Weights   12/20/23 2106 12/25/23 0500 12/25/23 1316  Weight: 92.6 kg 92.1 kg 92.1 kg   Body mass index is 27.54 kg/m.   Physical Exam:  GENERAL: Patient is alert awake and oriented. Not in obvious distress.  Appears weak and deconditioned. HENT: No scleral pallor or icterus. Pupils equally reactive to light. Oral mucosa is moist NECK: is supple, no gross swelling noted. CHEST: Clear to auscultation. No crackles or wheezes.  Diminished breath sounds bilaterally.  Wound VAC in the back. CVS: S1 and S2 heard, no murmur. Regular rate and rhythm.  ABDOMEN: Soft, non-tender, bowel sounds are present.  External urinary catheter in place. EXTREMITIES: No edema.  Right upper extremity PICC line in place. CNS: Cranial nerves are intact.   Weak lower extremities with symptoms. SKIN: warm and dry without rashes.  Pressure injuries as described.  Data Review: I have personally reviewed the following laboratory data and studies,  CBC: Recent Labs  Lab 12/23/23 0358 12/24/23 0328 12/25/23 0358 12/26/23 0431 12/27/23 0515  WBC 8.9 9.3 10.1 13.3* 8.7  NEUTROABS  --   --  8.1* 12.4*  --   HGB 8.2* 8.5* 8.3* 7.9* 7.4*  HCT 25.3* 26.4* 26.4* 24.9* 23.4*  MCV 95.8 95.3 97.1 95.0 95.5  PLT 219 258 254 262 243   Basic Metabolic Panel: Recent Labs  Lab 12/21/23 0417 12/22/23 0422 12/23/23 0358 12/24/23 0328 12/25/23 0358 12/26/23 0431 12/27/23 0515  NA 136 137 137 137 136 136  136  K 3.9 3.9 4.0 3.9 4.0 4.6 4.0  CL 106 104 107 104 103 103 105  CO2 24 25 21* 25 22 23 27   GLUCOSE 90 77 83 84 82 125* 87  BUN 16 13 11 13 13 15 15   CREATININE 0.57* 0.60* 0.65 0.67 0.62 0.70 0.79  CALCIUM  7.9* 8.1* 8.1* 8.5* 8.3* 8.3* 8.2*  MG 1.8 2.0  --  1.7 1.9  --   --   PHOS 3.0  --   --   --   --   --   --    Liver Function Tests: No results for input(s): "AST", "ALT", "ALKPHOS", "BILITOT", "PROT", "ALBUMIN " in the last 168 hours. No results for input(s): "LIPASE", "AMYLASE" in the last 168 hours. No results for input(s): "AMMONIA" in the last 168 hours. Cardiac Enzymes: No results for input(s): "CKTOTAL", "CKMB", "CKMBINDEX", "TROPONINI" in the last 168 hours. BNP (last 3 results) Recent Labs    12/16/23 1049  BNP 274.3*    ProBNP (last 3 results) No results for input(s): "PROBNP" in the last 8760 hours.  CBG: Recent Labs  Lab 12/26/23 1123 12/26/23 1715 12/26/23 2111 12/27/23 0609 12/27/23 1104  GLUCAP 128* 113* 121* 96 80   No results found for this or any previous visit (from the past 240 hours).   Studies: CT LUMBAR SPINE WO CONTRAST Result Date: 12/26/2023 CLINICAL DATA:  73 year old male postoperative day 1 surgery for lumbar wound dehiscence, wound drainage. EXAM: CT LUMBAR SPINE WITHOUT CONTRAST  TECHNIQUE: Multidetector CT imaging of the lumbar spine was performed without intravenous contrast administration. Multiplanar CT image reconstructions were also generated. RADIATION DOSE REDUCTION: This exam was performed according to the departmental dose-optimization program which includes automated exposure control, adjustment of the mA and/or kV according to patient size and/or use of iterative reconstruction technique. COMPARISON:  Postoperative lumbar spine CT 12/16/2023, previous lumbar CT 12/06/2023. FINDINGS: Segmentation: Normal, the same numbering system use recently. Alignment: Stable. Mild levoconvex lumbar scoliosis with mild grade 1 anterolisthesis of L5 on S1 and otherwise mild straightening of lumbar lordosis. Vertebrae: Postoperative changes, spine hardware and other postoperative findings are described below. Pre-existing L4 superior vertebral body fracture along the course of the right L4 pedicle screw, and similar nondisplaced fracture of the left L4 transverse process stable since 12/06/2023. Since 12/16/2023 there is a new nondisplaced fracture of the left L5 pedicle (series 4, image 102 and sagittal image 72) superimposed on chronic left L5 pars fracture which is stable. Contralateral right L5 posterior element fracture along the lateral margin of the pedicle (series 4, image 106) is stable since 12/16/2023, new since 11/16/2023. See also coronal image 57. Nondisplaced left superior sacral ala fracture on series 4, image 113 is stable since 12/16/2023, also new since 12/06/2023. Visible SI joints remain aligned. Sacrum elsewhere appears to remain intact. Paraspinal and other soft tissues: New postoperative changes to the paraspinal soft tissues with 2 postoperative drains in place tracking along the posterior elements of L2 through L5. superimposed postoperative bone graft material and small volume of postoperative soft tissue gas. No organized or drainable fluid collection is evident.  Superimposed Aortoiliac calcified atherosclerosis. Stable visible abdominal viscera. Increased retained stool in the visible rectosigmoid colon. Disc levels: T11-T12: Interbody ankylosis from right far lateral flowing endplate osteophyte. T12-L1:  Stable.  Anterior disc osteophyte degeneration. L1-L2:  Stable.  Mostly anterior disc bulging and endplate spurring. L2-L3: Bilateral L2 and L3 pedicle screws are stable since 12/16/2023. No interbody implant. Stable interbody vacuum  phenomena. No convincing fracture along this hardware. L3-L4: Stable removal of the left L4 pedicle screw since 12/16/2023. Interbody implant here with stable endplate subsidence. Stable right L4 superior endplate fracture since last month. Posterior decompression. L4-L5: L4 hardware detailed above and L5 hardware detailed below. No interbody implant. Stable vacuum disc. Stable left laminectomy. L5-S1: Left L5 transpedicular and right lateral marginal pedicle fractures as above. These are along the course of the bilateral L5 screws. The L5 hardware itself appears stable. Interbody implant appears stable. S1 pedicle screws appear stable with stable left superior sacral ala fracture (coronal image 52). IMPRESSION: 1. Posterior paraspinal postoperative drains now in place, with recent postoperative soft tissue gas. No discrete soft tissue or bony changes of Infection by CT. 2. However, new Left L5 nondisplaced pedicle fracture since 12/16/2023, which is superimposed on stable minimally displaced fractures along the lateral margin of the right L5 pedicle and the left S1 superior sacral ala (along the right L5 and left S1 screws, respectively). 3. Stable nondisplaced fracture of the right superior L4 vertebral body contiguous with the right L4 pedicle screw since last month. Stable previously removed left L4 pedicle screw. And stable other spinal hardware. 4. Aortic Atherosclerosis (ICD10-I70.0). Increased rectosigmoid retained stool.  Electronically Signed   By: Marlise Simpers M.D.   On: 12/26/2023 13:19   US  EKG SITE RITE Result Date: 12/26/2023 If Site Rite image not attached, placement could not be confirmed due to current cardiac rhythm.     Trenisha Lafavor, MD  [facility] 12/27/2023  If 7PM-7AM, please contact night-coverage

## 2023-12-27 NOTE — Anesthesia Postprocedure Evaluation (Signed)
 Anesthesia Post Note  Patient: Norman Lambert  Procedure(s) Performed: LUMBAR WOUND IRRIGATION AND DEBRIDEMENT     Patient location during evaluation: PACU Anesthesia Type: General Level of consciousness: awake and alert Pain management: pain level controlled Vital Signs Assessment: post-procedure vital signs reviewed and stable Respiratory status: spontaneous breathing, nonlabored ventilation, respiratory function stable and patient connected to nasal cannula oxygen Cardiovascular status: blood pressure returned to baseline and stable Postop Assessment: no apparent nausea or vomiting Anesthetic complications: no   No notable events documented.  Last Vitals:  Vitals:   12/27/23 0400 12/27/23 0725  BP: (!) 115/50 (!) 97/56  Pulse: 73 67  Resp: 20 19  Temp: 36.5 C 36.8 C  SpO2: 98% 95%    Last Pain:  Vitals:   12/27/23 1101  TempSrc:   PainSc: 9                  Bradd Merlos S

## 2023-12-27 NOTE — Progress Notes (Addendum)
 [facility]    Date of Admission:  12/05/2023      ID: Norman Lambert is a 73 y.o. male with  polymicrobial spinal infection involving hw and secondary proteus bacteremia Principal Problem:   Back pain Active Problems:   S/P lumbar fusion   Septic shock (HCC)   Bacteremia due to Proteus species   Pressure injury of skin   Paroxysmal A-fib (HCC)   Acute postoperative anemia due to expected blood loss   Benign prostatic hyperplasia   Gastroesophageal reflux disease   Acute metabolic encephalopathy    Subjective: Sleepy but has better pain control. He is wishing to go home  Medications:   vitamin C   1,000 mg Oral Daily   Chlorhexidine  Gluconate Cloth  6 each Topical Daily   cyanocobalamin   1,000 mcg Oral Daily   enoxaparin  (LOVENOX ) injection  50 mg Subcutaneous Daily   feeding supplement  237 mL Oral BID BM   finasteride   5 mg Oral Daily   folic acid   1 mg Oral Daily   gabapentin   300 mg Oral TID   insulin  aspart  0-5 Units Subcutaneous QHS   insulin  aspart  0-9 Units Subcutaneous TID WC   levothyroxine   112 mcg Oral Q0600   lidocaine   2 patch Transdermal Q24H   loratadine   10 mg Oral Daily   melatonin  3 mg Oral QHS   midodrine   10 mg Oral TID WC   modafinil   200 mg Oral Daily   oxyCODONE   20 mg Oral Q12H   pantoprazole   40 mg Oral Daily   polyethylene glycol  17 g Oral Daily   predniSONE   10 mg Oral Q breakfast   Followed by   Norman Lambert ON 01/03/2024] predniSONE   5 mg Oral Q breakfast   Followed by   Norman Lambert ON 01/10/2024] predniSONE   2.5 mg Oral Q breakfast   rosuvastatin   5 mg Oral Daily   senna  1 tablet Oral BID   sodium chloride  flush  3 mL Intravenous Q12H   sodium chloride  flush  3 mL Intravenous Q12H   sodium chloride  flush  3-10 mL Intravenous Q12H   zinc  sulfate (50mg  elemental zinc )  220 mg Oral Daily    Objective: Vital signs in last 24 hours: Temp:  [97.7 F (36.5 C)-98.6 F (37 C)] 98.3 F (36.8 C) (05/14 0725) Pulse  Rate:  [59-76] 67 (05/14 0725) Resp:  [9-20] 19 (05/14 0725) BP: (97-139)/(46-67) 97/56 (05/14 0725) SpO2:  [89 %-98 %] 95 % (05/14 0725)  Physical Exam  Constitutional: He is oriented to person, place, and time. He appears well-developed and well-nourished. No distress.  HENT:  Mouth/Throat: Oropharynx is clear and moist. No oropharyngeal exudate.  Cardiovascular: Normal rate, regular rhythm and normal heart sounds. Exam reveals no gallop and no friction rub.  No murmur heard.  Pulmonary/Chest: Effort normal and breath sounds normal. No respiratory distress. He has no wheezes.  Abdominal: Soft. Bowel sounds are normal. He exhibits no distension. There is no tenderness.  Lymphadenopathy:  He has no cervical adenopathy.  Neurological: He is alert and oriented to person, place, and time.  Skin: Skin is warm and dry. No rash noted. No erythema.  Psychiatric: He has a normal mood and affect. His behavior is normal.    Lab Results Recent Labs    12/26/23 0431 12/27/23 0515  WBC 13.3* 8.7  HGB 7.9* 7.4*  HCT 24.9* 23.4*  NA 136 136  K 4.6 4.0  CL 103 105  CO2 23 27  BUN 15 15  CREATININE 0.70 0.79   Lab Results  Component Value Date   ESRSEDRATE 38 (H) 12/05/2023   Lab Results  Component Value Date   CRP 20.0 (H) 12/16/2023    Microbiology: Reviewed. Polymicrobial  Studies/Results: CT LUMBAR SPINE WO CONTRAST Result Date: 12/26/2023 CLINICAL DATA:  73 year old male postoperative day 1 surgery for lumbar wound dehiscence, wound drainage. EXAM: CT LUMBAR SPINE WITHOUT CONTRAST TECHNIQUE: Multidetector CT imaging of the lumbar spine was performed without intravenous contrast administration. Multiplanar CT image reconstructions were also generated. RADIATION DOSE REDUCTION: This exam was performed according to the departmental dose-optimization program which includes automated exposure control, adjustment of the mA and/or kV according to patient size and/or use of iterative  reconstruction technique. COMPARISON:  Postoperative lumbar spine CT 12/16/2023, previous lumbar CT 12/06/2023. FINDINGS: Segmentation: Normal, the same numbering system use recently. Alignment: Stable. Mild levoconvex lumbar scoliosis with mild grade 1 anterolisthesis of L5 on S1 and otherwise mild straightening of lumbar lordosis. Vertebrae: Postoperative changes, spine hardware and other postoperative findings are described below. Pre-existing L4 superior vertebral body fracture along the course of the right L4 pedicle screw, and similar nondisplaced fracture of the left L4 transverse process stable since 12/06/2023. Since 12/16/2023 there is a new nondisplaced fracture of the left L5 pedicle (series 4, image 102 and sagittal image 72) superimposed on chronic left L5 pars fracture which is stable. Contralateral right L5 posterior element fracture along the lateral margin of the pedicle (series 4, image 106) is stable since 12/16/2023, new since 11/16/2023. See also coronal image 57. Nondisplaced left superior sacral ala fracture on series 4, image 113 is stable since 12/16/2023, also new since 12/06/2023. Visible SI joints remain aligned. Sacrum elsewhere appears to remain intact. Paraspinal and other soft tissues: New postoperative changes to the paraspinal soft tissues with 2 postoperative drains in place tracking along the posterior elements of L2 through L5. superimposed postoperative bone graft material and small volume of postoperative soft tissue gas. No organized or drainable fluid collection is evident. Superimposed Aortoiliac calcified atherosclerosis. Stable visible abdominal viscera. Increased retained stool in the visible rectosigmoid colon. Disc levels: T11-T12: Interbody ankylosis from right far lateral flowing endplate osteophyte. T12-L1:  Stable.  Anterior disc osteophyte degeneration. L1-L2:  Stable.  Mostly anterior disc bulging and endplate spurring. L2-L3: Bilateral L2 and L3 pedicle screws  are stable since 12/16/2023. No interbody implant. Stable interbody vacuum phenomena. No convincing fracture along this hardware. L3-L4: Stable removal of the left L4 pedicle screw since 12/16/2023. Interbody implant here with stable endplate subsidence. Stable right L4 superior endplate fracture since last month. Posterior decompression. L4-L5: L4 hardware detailed above and L5 hardware detailed below. No interbody implant. Stable vacuum disc. Stable left laminectomy. L5-S1: Left L5 transpedicular and right lateral marginal pedicle fractures as above. These are along the course of the bilateral L5 screws. The L5 hardware itself appears stable. Interbody implant appears stable. S1 pedicle screws appear stable with stable left superior sacral ala fracture (coronal image 52). IMPRESSION: 1. Posterior paraspinal postoperative drains now in place, with recent postoperative soft tissue gas. No discrete soft tissue or bony changes of Infection by CT. 2. However, new Left L5 nondisplaced pedicle fracture since 12/16/2023, which is superimposed on stable minimally displaced fractures along the lateral margin of the right L5 pedicle and the left S1 superior sacral ala (along the right L5 and left S1 screws, respectively). 3. Stable nondisplaced fracture of the right  superior L4 vertebral body contiguous with the right L4 pedicle screw since last month. Stable previously removed left L4 pedicle screw. And stable other spinal hardware. 4. Aortic Atherosclerosis (ICD10-I70.0). Increased rectosigmoid retained stool. Electronically Signed   By: Marlise Simpers M.D.   On: 12/26/2023 13:19   US  EKG SITE RITE Result Date: 12/26/2023 If Site Rite image not attached, placement could not be confirmed due to current cardiac rhythm.    Assessment/Plan: Polymicrobial spinal HW infection = will do a combination of IV and orals. Plan for 4 wk of cefepime  and daptomycin until 6/5. Then transition to levofloxacin 750mg  daily plus linezolid  600mg  bid for additional 2 wks   Therapeutic drug monitoring = will check baseline ck and weekly thereafter  New pedicle L5 fracture = defer to neurosurgery to see if any further surgical interventions needed  Chronic back pain = defer to primary team  Will coordinate home health care and follow up visit.  Diagnosis: polymicrobial lumbar spinal infection  Culture Result: proteus, klebsiella, corynbacterium  Allergies  Allergen Reactions   Benzoic Acid Hives and Rash    Positive Patch Test 11/12/20   Imidurea Rash    Positive Patch Test 11/12/20   Urea  Rash    Positive Patch Test 11/12/20   Other Rash and Other (See Comments)    " DARK DYES " [ UNSPECIFIED DYE ]    OPAT Orders Discharge antibiotics to be given via PICC line Discharge antibiotics: Per pharmacy protocol daptomycin 8mg /kg/day plus cefepime    Duration: 4 wk of cefepime  plus dapto, then start oral abtx on 01/19/2024 ( levofloxacin plus linezolid -- we will fill here prior to discharge to give to patient)  End Date: June 5th   [facility] Care Per Protocol:  Home health RN for IV administration and teaching; PICC line care and labs.    Labs weekly while on IV antibiotics: __x CBC with differential _x_ BMP  _x_ CRP _x_ ESR  _x_ CK  _x_ Please pull PIC at completion of IV antibiotics  Fax weekly labs to [phone]  Clinic Follow Up Appt: 4-5 wk  @ [facility] --- Will sign off ---------- I have personally spent 50 minutes involved in face-to-face and non-face-to-face activities for this patient on the day of the visit. Professional time spent includes the following activities: Preparing to see the patient (review of tests), Obtaining and/or reviewing separately obtained history (admission/discharge record), Performing a medically appropriate examination and/or evaluation , Ordering medications/tests/procedures, referring and communicating with other health care professionals, Documenting clinical information in  the EMR, Independently interpreting results (not separately reported), Communicating results to his wife.      [facility] Pager: [phone]  12/27/2023, 10:20 AM

## 2023-12-28 ENCOUNTER — Other Ambulatory Visit (HOSPITAL_COMMUNITY): Payer: Self-pay

## 2023-12-28 DIAGNOSIS — M5440 Lumbago with sciatica, unspecified side: Secondary | ICD-10-CM | POA: Diagnosis not present

## 2023-12-28 LAB — CBC
HCT: 23 % — ABNORMAL LOW (ref 39.0–52.0)
Hemoglobin: 7.3 g/dL — ABNORMAL LOW (ref 13.0–17.0)
MCH: 30.3 pg (ref 26.0–34.0)
MCHC: 31.7 g/dL (ref 30.0–36.0)
MCV: 95.4 fL (ref 80.0–100.0)
Platelets: 235 10*3/uL (ref 150–400)
RBC: 2.41 MIL/uL — ABNORMAL LOW (ref 4.22–5.81)
RDW: 13.9 % (ref 11.5–15.5)
WBC: 10.4 10*3/uL (ref 4.0–10.5)
nRBC: 0 % (ref 0.0–0.2)

## 2023-12-28 LAB — GLUCOSE, CAPILLARY
Glucose-Capillary: 90 mg/dL (ref 70–99)
Glucose-Capillary: 106 mg/dL — ABNORMAL HIGH (ref 70–99)
Glucose-Capillary: 115 mg/dL — ABNORMAL HIGH (ref 70–99)
Glucose-Capillary: 103 mg/dL — ABNORMAL HIGH (ref 70–99)

## 2023-12-28 LAB — MAGNESIUM: Magnesium: 1.7 mg/dL (ref 1.7–2.4)

## 2023-12-28 LAB — CK: Total CK: 20 U/L — ABNORMAL LOW (ref 49–397)

## 2023-12-28 MED ORDER — LEVOFLOXACIN 750 MG PO TABS
750.0000 mg | ORAL_TABLET | Freq: Every day | ORAL | 0 refills | Status: DC
  Filled 2023-12-28: qty 14, 14d supply, fill #0

## 2023-12-28 MED ORDER — LINEZOLID 600 MG PO TABS
600.0000 mg | ORAL_TABLET | Freq: Two times a day (BID) | ORAL | 0 refills | Status: DC
  Filled 2023-12-28: qty 28, 14d supply, fill #0

## 2023-12-28 MED ORDER — MAGNESIUM OXIDE -MG SUPPLEMENT 400 (240 MG) MG PO TABS
400.0000 mg | ORAL_TABLET | Freq: Two times a day (BID) | ORAL | Status: DC
  Administered 2023-12-28 – 2024-01-12 (×30): 400 mg via ORAL
  Filled 2023-12-28 (×31): qty 1

## 2023-12-28 MED ORDER — ENOXAPARIN SODIUM 40 MG/0.4ML IJ SOSY
40.0000 mg | PREFILLED_SYRINGE | Freq: Every day | INTRAMUSCULAR | Status: DC
  Administered 2023-12-29 – 2024-01-15 (×18): 40 mg via SUBCUTANEOUS
  Filled 2023-12-28 (×18): qty 0.4

## 2023-12-28 NOTE — Progress Notes (Signed)
 Patient ID: Norman Lambert, male   DOB: [birth year], 73 y.o.   MRN: [id number] No changes over night Continue bedrest for now Continue abx Discussed issues with him and his wife at length

## 2023-12-28 NOTE — Progress Notes (Signed)
 PROGRESS NOTE  Norman Lambert JXB:[id number] DOB: [birth year] DOA: 12/05/2023 PCP: Patient, No Pcp Per   LOS: 23 days   Brief narrative:  73 year old male with past medical history of hypertension, gout, hyperlipidemia, hypothyroidism, sleep apnea was admitted on 4/25 for reduction and internal fixation of previous fusions involving L2-S1. Postoperative course initially seemed unremarkable but subsequently he continued to have increased pain involving the left foot seemingly not responding to as needed narcotics. Imaging of the back was unremarkable, he continued to have drain VAC in place.  Subsequently on 12/15/2023 patient had hypotension with tachycardia and fever up to 103 F.  Patient was then started on IV cefepime  and vancomycin  with concern about sepsis.  There was significant wound erythema with serosanguineous foul-smelling drainage from the surgical site.  Patient had Levophed  briefly but subsequently was weaned off    Significant hospital events 4/25 Extensive L2-S1 lumbar surgery 4/30 New onset pain  5/1 Ongoing pain with borderline blood pressure 5/2 Pain, leukocytosis fever, 5/3 Hypotension, not responding to crystalloid started on broad-spectrum antibiotics wound assessed by surgical team culture sent imaging pending ICU transfer. CT imaging of lumbar spine was negative for definitive abscess there were stable fractures at L3 and L4 with possible new fracture involving the transverse process of L2 and L3 on the left postoperative changes noted.CT angio of chest was negative for pulmonary emboli had bibasilar airspace disease the right was a little worse than the left consistent with possible pneumonia there was aortic aneurysmal dilation of the ascending aorta measuring at 4.5 cm 5/4 Weaned off norepinephrine , Blood cultures from 1 out of 2 samples growing gram-negative rods with BC ID showing both proteus and Enterobacterales, MRSA PCR pending. Narrowed to ceftriaxone . 5/5 Remains on  low dose NE. Febrile. BCx growing proteus.  5/6 Wound Cx with proteus, klebsiella aerogenes. Abx re-broadened to cefepime . 5/8 Required low dose norepi overnight but off this AM. His wound vac was changed this AM and has been having some pain 5/9 no issues overnight, denies any acute pain this a.m. 5/14-continues to have back pain.     Assessment/Plan: Principal Problem:   Back pain Active Problems:   S/P lumbar fusion   Septic shock (HCC)   Bacteremia due to Proteus species   Pressure injury of skin   Paroxysmal A-fib (HCC)   Acute postoperative anemia due to expected blood loss   Benign prostatic hyperplasia   Gastroesophageal reflux disease   Acute metabolic encephalopathy   Polymicrobial bacterial infection  Septic shock secondary to Proteus bacteremia likely from postsurgical infection/possible pneumonia. Surgical wound cultures growing Klebsiella aerogenes and Proteus Mirabilis. Blood cultures from 12/16/2023 with Proteus Mirabilis.  Neurosurgery on board and noted dehiscence of the surgical site.  Continue cefepime  and daptomycin..  On midodrine .  Continue steroid taper.  ID following.  Continue midodrine .  VAC management as per neurosurgery.  Follow ID recommendation.    Status post redo of laminectomy L3-4 with posterior fixation L2-S1 4/25  Pain management.  Repeat CT scan of the lumbar spine done 12/26/2023 shows fracture of the L5 pedicle.  Likely fragility fractures from osteoporosis from long-term steroids.  Currently on bedrest as per neurosurgery.   Paroxysmal A-fib with RVR CHA2DS2VASC score 3. In normal sinus rhythm at this time.  Asymptomatic.  Review of 2D echocardiogram with EF of 55 to 60%.  Currently on Lovenox .   Hyperlipidemia - Continue statin.   Essential hypertension On midodrine  due to low blood pressure.  Latest blood pressure of 101/41  Peripheral vascular disease Continue statins.   Acute metabolic encephalopathy secondary to sepsis Improving.   Continue Delirium precautions.   Postop acute blood loss anemia -Hemoglobin currently 7.3 from 7.4.  Transfuse for hemoglobin less than 7.    BPH -Continue Flomax , finasteride .   GERD Continue PPI.   Hypothyroidism Continue Synthroid .   Hyperglycemia in the setting of steroids - Continue sliding scale insulin .  Latest POC glucose of 80.  Pressure injuries, not POA, buttocks stage II, deep tissue pressure injury of the left toe, right heel deep tissue injury.  Continue wound care.  Pressure Injury 12/16/23 Buttocks Stage 2 -  Partial thickness loss of dermis presenting as a shallow open injury with a red, pink wound bed without slough. (Active)  12/16/23 1300  Location: Buttocks  Location Orientation:   Staging: Stage 2 -  Partial thickness loss of dermis presenting as a shallow open injury with a red, pink wound bed without slough.  Wound Description (Comments):   Present on Admission:      Pressure Injury 12/16/23 Toe (Comment  which one) Anterior;Left Deep Tissue Pressure Injury - Purple or maroon localized area of discolored intact skin or blood-filled blister due to damage of underlying soft tissue from pressure and/or shear. (Active)  12/16/23 1300  Location: Toe (Comment  which one)  Location Orientation: Anterior;Left  Staging: Deep Tissue Pressure Injury - Purple or maroon localized area of discolored intact skin or blood-filled blister due to damage of underlying soft tissue from pressure and/or shear.  Wound Description (Comments):   Present on Admission:      Pressure Injury 12/20/23 Heel Right Deep Tissue Pressure Injury - Purple or maroon localized area of discolored intact skin or blood-filled blister due to damage of underlying soft tissue from pressure and/or shear. black (Active)  12/20/23 2000  Location: Heel  Location Orientation: Right  Staging: Deep Tissue Pressure Injury - Purple or maroon localized area of discolored intact skin or blood-filled blister  due to damage of underlying soft tissue from pressure and/or shear.  Wound Description (Comments): black  Present on Admission: No    DVT prophylaxis: enoxaparin  (LOVENOX ) injection 40 mg Start: 12/29/23 1000lovenox subcu   Disposition: As per primary team.  Likely to go to skilled nursing facility.  Status is: Inpatient   Code Status:     Code Status: Full Code  Family Communication: Spoke with the patient's wife at bedside   Procedures: Redo decompressive laminectomy L3-4 for recurrent stenosis with removal of epidural bone graft material/posterior fixation L2-S1 inclusive using Alphatec cortical pedicle screw/intertransverse arthrodesis L2-S1 inclusive using morselized autograft and allograft/exploration of fusion L3-4 with removal of instrumentation per Neurosurgery: Dr. Rochelle Chu 12/08/2023 2D echo 12/18/2023  Anti-infectives:  Daptomycin and cefepime  IV  Anti-infectives (From admission, onward)    Start     Dose/Rate Route Frequency Ordered Stop   01/19/24 0000  levofloxacin (LEVAQUIN) 750 MG tablet        750 mg Oral Daily 12/28/23 0937 02/02/24 2359   01/19/24 0000  linezolid (ZYVOX) 600 MG tablet        600 mg Oral 2 times daily 12/28/23 0937 02/02/24 2359   12/27/23 1100  DAPTOmycin (CUBICIN) IVPB 700 mg/153mL premix        8 mg/kg  92.1 kg 200 mL/hr over 30 Minutes Intravenous Daily 12/27/23 0824     12/19/23 1300  ceFEPIme  (MAXIPIME ) 2 g in sodium chloride  0.9 % 100 mL IVPB        2 g 200  mL/hr over 30 Minutes Intravenous Every 8 hours 12/19/23 1116     12/17/23 1000  vancomycin  (VANCOREADY) IVPB 1500 mg/300 mL  Status:  Discontinued       Placed in "Followed by" Linked Group   1,500 mg 150 mL/hr over 120 Minutes Intravenous Every 24 hours 12/16/23 0920 12/17/23 1009   12/17/23 1000  cefTRIAXone  (ROCEPHIN ) 2 g in sodium chloride  0.9 % 100 mL IVPB  Status:  Discontinued        2 g 200 mL/hr over 30 Minutes Intravenous Every 24 hours 12/17/23 0159 12/19/23 1116    12/16/23 1030  vancomycin  (VANCOREADY) IVPB 2000 mg/400 mL       Placed in "Followed by" Linked Group   2,000 mg 200 mL/hr over 120 Minutes Intravenous  Once 12/16/23 0920 12/16/23 1359   12/16/23 1015  ceFEPIme  (MAXIPIME ) 2 g in sodium chloride  0.9 % 100 mL IVPB  Status:  Discontinued        2 g 200 mL/hr over 30 Minutes Intravenous Every 8 hours 12/16/23 0920 12/17/23 0158   12/08/23 1900  ceFAZolin  (ANCEF ) IVPB 2g/100 mL premix        2 g 200 mL/hr over 30 Minutes Intravenous Every 8 hours 12/08/23 1721 12/09/23 0452   12/08/23 1015  ceFAZolin  (ANCEF ) IVPB 2g/100 mL premix        2 g 200 mL/hr over 30 Minutes Intravenous On call to O.R. 12/08/23 1000 12/08/23 1105   12/08/23 0952  ceFAZolin  (ANCEF ) 2-4 GM/100ML-% IVPB       Note to Pharmacy: Noelle Batten M: cabinet override      12/08/23 0952 12/08/23 1105      Subjective: Today, patient was seen and examined at bedside.  Patient lying in bed and fearful about movement.  Patient's spouse at bedside as well.  No mention of fever chills shortness of breath but has decreased appetite.  Has not had a bowel movement.   Objective: Vitals:   12/28/23 1126 12/28/23 1149  BP: (!) 101/41   Pulse: 75 73  Resp: 17 18  Temp: 100.1 F (37.8 C)   SpO2: 94% 94%    Intake/Output Summary (Last 24 hours) at 12/28/2023 1248 Last data filed at 12/28/2023 1127 Gross per 24 hour  Intake 300 ml  Output 1700 ml  Net -1400 ml   Filed Weights   12/20/23 2106 12/25/23 0500 12/25/23 1316  Weight: 92.6 kg 92.1 kg 92.1 kg   Body mass index is 27.54 kg/m.   Physical Exam:  GENERAL: Patient is alert awake and oriented. Not in obvious distress.  Appears weak and deconditioned. HENT: No scleral pallor or icterus. Pupils equally reactive to light. Oral mucosa is moist NECK: is supple, no gross swelling noted. CHEST: No crackles or wheezes.  Diminished breath sounds bilaterally.  Wound VAC in the back. CVS: S1 and S2 heard, no murmur. Regular  rate and rhythm.  ABDOMEN: Soft, non-tender, bowel sounds are present.  External urinary catheter in place. EXTREMITIES: No edema.  Right upper extremity PICC line in place. CNS: Cranial nerves are intact.  Weak lower extremities with symptoms. SKIN: warm and dry ,  Pressure injuries   Data Review: I have personally reviewed the following laboratory data and studies,  CBC: Recent Labs  Lab 12/24/23 0328 12/25/23 0358 12/26/23 0431 12/27/23 0515 12/28/23 0658  WBC 9.3 10.1 13.3* 8.7 10.4  NEUTROABS  --  8.1* 12.4*  --   --   HGB 8.5* 8.3* 7.9* 7.4* 7.3*  HCT 26.4* 26.4* 24.9* 23.4* 23.0*  MCV 95.3 97.1 95.0 95.5 95.4  PLT 258 254 262 243 235   Basic Metabolic Panel: Recent Labs  Lab 12/22/23 0422 12/23/23 0358 12/24/23 0328 12/25/23 0358 12/26/23 0431 12/27/23 0515 12/28/23 0658  NA 137 137 137 136 136 136  --   K 3.9 4.0 3.9 4.0 4.6 4.0  --   CL 104 107 104 103 103 105  --   CO2 25 21* 25 22 23 27   --   GLUCOSE 77 83 84 82 125* 87  --   BUN 13 11 13 13 15 15   --   CREATININE 0.60* 0.65 0.67 0.62 0.70 0.79  --   CALCIUM  8.1* 8.1* 8.5* 8.3* 8.3* 8.2*  --   MG 2.0  --  1.7 1.9  --   --  1.7   Liver Function Tests: No results for input(s): "AST", "ALT", "ALKPHOS", "BILITOT", "PROT", "ALBUMIN " in the last 168 hours. No results for input(s): "LIPASE", "AMYLASE" in the last 168 hours. No results for input(s): "AMMONIA" in the last 168 hours. Cardiac Enzymes: Recent Labs  Lab 12/28/23 0658  CKTOTAL 20*   BNP (last 3 results) Recent Labs    12/16/23 1049  BNP 274.3*    ProBNP (last 3 results) No results for input(s): "PROBNP" in the last 8760 hours.  CBG: Recent Labs  Lab 12/27/23 1104 12/27/23 1621 12/27/23 2122 12/28/23 0607 12/28/23 1125  GLUCAP 80 112* 97 90 106*   No results found for this or any previous visit (from the past 240 hours).   Studies: No results found.     Ilynn Stauffer, MD  [facility] 12/28/2023  If 7PM-7AM, please  contact night-coverage

## 2023-12-29 DIAGNOSIS — M5440 Lumbago with sciatica, unspecified side: Secondary | ICD-10-CM | POA: Diagnosis not present

## 2023-12-29 LAB — GLUCOSE, CAPILLARY
Glucose-Capillary: 80 mg/dL (ref 70–99)
Glucose-Capillary: 123 mg/dL — ABNORMAL HIGH (ref 70–99)
Glucose-Capillary: 141 mg/dL — ABNORMAL HIGH (ref 70–99)
Glucose-Capillary: 92 mg/dL (ref 70–99)

## 2023-12-29 LAB — CBC
HCT: 23.5 % — ABNORMAL LOW (ref 39.0–52.0)
Hemoglobin: 7.2 g/dL — ABNORMAL LOW (ref 13.0–17.0)
MCH: 29.6 pg (ref 26.0–34.0)
MCHC: 30.6 g/dL (ref 30.0–36.0)
MCV: 96.7 fL (ref 80.0–100.0)
Platelets: 228 10*3/uL (ref 150–400)
RBC: 2.43 MIL/uL — ABNORMAL LOW (ref 4.22–5.81)
RDW: 13.8 % (ref 11.5–15.5)
WBC: 10.1 10*3/uL (ref 4.0–10.5)
nRBC: 0 % (ref 0.0–0.2)

## 2023-12-29 NOTE — Progress Notes (Signed)
 PROGRESS NOTE  Norman Lambert ZOX:[id number] DOB: Jan 05, 1951 DOA: 12/05/2023 PCP: Patient, No Pcp Per   LOS: 24 days   Brief narrative:  73 year old male with past medical history of hypertension, gout, hyperlipidemia, hypothyroidism, sleep apnea was admitted on 4/25 for reduction and internal fixation of previous fusions involving L2-S1. Postoperative course initially seemed unremarkable but subsequently he continued to have increased pain involving the left foot seemingly not responding to as needed narcotics. Imaging of the back was unremarkable, he continued to have drain VAC in place.  Subsequently on 12/15/2023 patient had hypotension with tachycardia and fever up to 103 F.  Patient was then started on IV cefepime  and vancomycin  with concern about sepsis.  There was significant wound erythema with serosanguineous foul-smelling drainage from the surgical site.  Patient had Levophed  briefly but subsequently was weaned off    Significant hospital events 4/25 Extensive L2-S1 lumbar surgery 4/30 New onset pain  5/1 Ongoing pain with borderline blood pressure 5/2 Pain, leukocytosis fever, 5/3 Hypotension, not responding to crystalloid started on broad-spectrum antibiotics wound assessed by surgical team culture sent imaging pending ICU transfer. CT imaging of lumbar spine was negative for definitive abscess there were stable fractures at L3 and L4 with possible new fracture involving the transverse process of L2 and L3 on the left postoperative changes noted.CT angio of chest was negative for pulmonary emboli had bibasilar airspace disease the right was a little worse than the left consistent with possible pneumonia there was aortic aneurysmal dilation of the ascending aorta measuring at 4.5 cm 5/4 Weaned off norepinephrine , Blood cultures from 1 out of 2 samples growing gram-negative rods with BC ID showing both proteus and Enterobacterales, MRSA PCR pending. Narrowed to ceftriaxone . 5/5 Remains on  low dose NE. Febrile. BCx growing proteus.  5/6 Wound Cx with proteus, klebsiella aerogenes. Abx re-broadened to cefepime . 5/8 Required low dose norepi overnight but off this AM. His wound vac was changed this AM and has been having some pain 5/9 no issues overnight, denies any acute pain this a.m. 5/14-continues to have back pain.     Assessment/Plan: Principal Problem:   Back pain Active Problems:   S/P lumbar fusion   Septic shock (HCC)   Bacteremia due to Proteus species   Pressure injury of skin   Paroxysmal A-fib (HCC)   Acute postoperative anemia due to expected blood loss   Benign prostatic hyperplasia   Gastroesophageal reflux disease   Acute metabolic encephalopathy   Polymicrobial bacterial infection  Septic shock secondary to Proteus bacteremia likely from postsurgical infection/possible pneumonia. Resolved at this time.  Surgical wound cultures growing Klebsiella aerogenes and Proteus Mirabilis. Blood cultures from 12/16/2023 with Proteus Mirabilis.  Neurosurgery on board and noted dehiscence of the surgical site.  Continue cefepime  and daptomycin.  On midodrine .  Continue steroid taper.  ID following.  Continue midodrine .  VAC management as per neurosurgery.  Follow ID recommendation.  Temperature max of 100.1 F within the last 24 hours.  Status post redo of laminectomy L3-4 with posterior fixation L2-S1 4/25  Pain management.  Repeat CT scan of the lumbar spine done 12/26/2023 shows fracture of the L5 pedicle.  Likely fragility fractures from osteoporosis from long-term steroids.  Currently on bedrest as per neurosurgery.  Continues to have pain.   Paroxysmal A-fib with RVR CHA2DS2VASC score 3. In normal sinus rhythm at this time.  Asymptomatic.  Review of 2D echocardiogram with EF of 55 to 60%.  Currently on Lovenox .   Hyperlipidemia - Continue  statin.   Essential hypertension On midodrine  due to low blood pressure.  Latest blood pressure of 105/51   Peripheral  vascular disease Continue statins.   Acute metabolic encephalopathy secondary to sepsis Improving.  Communicative.  Appears slow to respond.  Continue Delirium precautions.   Postop acute blood loss anemia -Hemoglobin currently 7.2 from 7.3<7.4.  Transfuse for hemoglobin less than 7.    BPH -Continue Flomax , finasteride .   GERD Continue PPI.   Hypothyroidism Continue Synthroid .   Hyperglycemia in the setting of steroids - Continue sliding scale insulin .  Latest POC glucose of 103  Pressure injuries, not POA, buttocks stage II, deep tissue pressure injury of the left toe, right heel deep tissue injury.  Continue wound care.  Pressure Injury 12/16/23 Buttocks Stage 2 -  Partial thickness loss of dermis presenting as a shallow open injury with a red, pink wound bed without slough. (Active)  12/16/23 1300  Location: Buttocks  Location Orientation:   Staging: Stage 2 -  Partial thickness loss of dermis presenting as a shallow open injury with a red, pink wound bed without slough.  Wound Description (Comments):   Present on Admission:      Pressure Injury 12/16/23 Toe (Comment  which one) Anterior;Left Deep Tissue Pressure Injury - Purple or maroon localized area of discolored intact skin or blood-filled blister due to damage of underlying soft tissue from pressure and/or shear. (Active)  12/16/23 1300  Location: Toe (Comment  which one)  Location Orientation: Anterior;Left  Staging: Deep Tissue Pressure Injury - Purple or maroon localized area of discolored intact skin or blood-filled blister due to damage of underlying soft tissue from pressure and/or shear.  Wound Description (Comments):   Present on Admission:      Pressure Injury 12/20/23 Heel Right Deep Tissue Pressure Injury - Purple or maroon localized area of discolored intact skin or blood-filled blister due to damage of underlying soft tissue from pressure and/or shear. black (Active)  12/20/23 2000  Location: Heel   Location Orientation: Right  Staging: Deep Tissue Pressure Injury - Purple or maroon localized area of discolored intact skin or blood-filled blister due to damage of underlying soft tissue from pressure and/or shear.  Wound Description (Comments): black  Present on Admission: No    DVT prophylaxis: enoxaparin  (LOVENOX ) injection 40 mg Start: 12/29/23 1000lovenox subcu   Disposition: As per primary team.  Likely to go to skilled nursing facility.  Status is: Inpatient   Code Status:     Code Status: Full Code  Family Communication: Spoke with the patient's wife at bedside   Procedures: Redo decompressive laminectomy L3-4 for recurrent stenosis with removal of epidural bone graft material/posterior fixation L2-S1 inclusive using Alphatec cortical pedicle screw/intertransverse arthrodesis L2-S1 inclusive using morselized autograft and allograft/exploration of fusion L3-4 with removal of instrumentation per Neurosurgery: Dr. Rochelle Chu 12/08/2023 2D echo 12/18/2023  Anti-infectives:  Daptomycin and cefepime  IV  Anti-infectives (From admission, onward)    Start     Dose/Rate Route Frequency Ordered Stop   01/19/24 0000  levofloxacin (LEVAQUIN) 750 MG tablet        750 mg Oral Daily 12/28/23 0937 02/02/24 2359   01/19/24 0000  linezolid (ZYVOX) 600 MG tablet        600 mg Oral 2 times daily 12/28/23 0937 02/02/24 2359   12/27/23 1100  DAPTOmycin (CUBICIN) IVPB 700 mg/151mL premix        8 mg/kg  92.1 kg 200 mL/hr over 30 Minutes Intravenous Daily 12/27/23 0824  12/19/23 1300  ceFEPIme  (MAXIPIME ) 2 g in sodium chloride  0.9 % 100 mL IVPB        2 g 200 mL/hr over 30 Minutes Intravenous Every 8 hours 12/19/23 1116     12/17/23 1000  vancomycin  (VANCOREADY) IVPB 1500 mg/300 mL  Status:  Discontinued       Placed in "Followed by" Linked Group   1,500 mg 150 mL/hr over 120 Minutes Intravenous Every 24 hours 12/16/23 0920 12/17/23 1009   12/17/23 1000  cefTRIAXone  (ROCEPHIN ) 2 g in  sodium chloride  0.9 % 100 mL IVPB  Status:  Discontinued        2 g 200 mL/hr over 30 Minutes Intravenous Every 24 hours 12/17/23 0159 12/19/23 1116   12/16/23 1030  vancomycin  (VANCOREADY) IVPB 2000 mg/400 mL       Placed in "Followed by" Linked Group   2,000 mg 200 mL/hr over 120 Minutes Intravenous  Once 12/16/23 0920 12/16/23 1359   12/16/23 1015  ceFEPIme  (MAXIPIME ) 2 g in sodium chloride  0.9 % 100 mL IVPB  Status:  Discontinued        2 g 200 mL/hr over 30 Minutes Intravenous Every 8 hours 12/16/23 0920 12/17/23 0158   12/08/23 1900  ceFAZolin  (ANCEF ) IVPB 2g/100 mL premix        2 g 200 mL/hr over 30 Minutes Intravenous Every 8 hours 12/08/23 1721 12/09/23 0452   12/08/23 1015  ceFAZolin  (ANCEF ) IVPB 2g/100 mL premix        2 g 200 mL/hr over 30 Minutes Intravenous On call to O.R. 12/08/23 1000 12/08/23 1105   12/08/23 0952  ceFAZolin  (ANCEF ) 2-4 GM/100ML-% IVPB       Note to Pharmacy: Noelle Batten M: cabinet override      12/08/23 0952 12/08/23 1105      Subjective: Today, patient was seen and examined at bedside.  Complains of pain in the back and does not want to move.  Denies any cough, congestion, dyspnea or shortness of breath.   Objective: Vitals:   12/29/23 0410 12/29/23 0814  BP: (!) 131/47 (!) 105/51  Pulse: 72 75  Resp: 20 18  Temp: 98.3 F (36.8 C) 98.4 F (36.9 C)  SpO2: 97% 99%    Intake/Output Summary (Last 24 hours) at 12/29/2023 1029 Last data filed at 12/29/2023 0117 Gross per 24 hour  Intake 300 ml  Output 1200 ml  Net -900 ml   Filed Weights   12/20/23 2106 12/25/23 0500 12/25/23 1316  Weight: 92.6 kg 92.1 kg 92.1 kg   Body mass index is 27.54 kg/m.   Physical Exam:  GENERAL: Patient is alert awake and Communicative.. Not in obvious distress.  Appears weak and deconditioned. HENT: No scleral pallor or icterus. Pupils equally reactive to light. Oral mucosa is moist NECK: is supple, no gross swelling noted. CHEST: No crackles or  wheezes.  Diminished breath sounds bilaterally.  Wound VAC in the back. CVS: S1 and S2 heard, no murmur. Regular rate and rhythm.  ABDOMEN: Soft, non-tender, bowel sounds are present.  External urinary catheter in place. EXTREMITIES: No edema.  Right upper extremity PICC line in place. CNS: Cranial nerves are intact.  Weak lower extremities with symptoms. SKIN: warm and dry ,  Pressure injuries   Data Review: I have personally reviewed the following laboratory data and studies,  CBC: Recent Labs  Lab 12/25/23 0358 12/26/23 0431 12/27/23 0515 12/28/23 0658 12/29/23 0600  WBC 10.1 13.3* 8.7 10.4 10.1  NEUTROABS 8.1* 12.4*  --   --   --  HGB 8.3* 7.9* 7.4* 7.3* 7.2*  HCT 26.4* 24.9* 23.4* 23.0* 23.5*  MCV 97.1 95.0 95.5 95.4 96.7  PLT 254 262 243 235 228   Basic Metabolic Panel: Recent Labs  Lab 12/23/23 0358 12/24/23 0328 12/25/23 0358 12/26/23 0431 12/27/23 0515 12/28/23 0658  NA 137 137 136 136 136  --   K 4.0 3.9 4.0 4.6 4.0  --   CL 107 104 103 103 105  --   CO2 21* 25 22 23 27   --   GLUCOSE 83 84 82 125* 87  --   BUN 11 13 13 15 15   --   CREATININE 0.65 0.67 0.62 0.70 0.79  --   CALCIUM  8.1* 8.5* 8.3* 8.3* 8.2*  --   MG  --  1.7 1.9  --   --  1.7   Liver Function Tests: No results for input(s): "AST", "ALT", "ALKPHOS", "BILITOT", "PROT", "ALBUMIN " in the last 168 hours. No results for input(s): "LIPASE", "AMYLASE" in the last 168 hours. No results for input(s): "AMMONIA" in the last 168 hours. Cardiac Enzymes: Recent Labs  Lab 12/28/23 0658  CKTOTAL 20*   BNP (last 3 results) Recent Labs    12/16/23 1049  BNP 274.3*    ProBNP (last 3 results) No results for input(s): "PROBNP" in the last 8760 hours.  CBG: Recent Labs  Lab 12/28/23 0607 12/28/23 1125 12/28/23 1632 12/28/23 2121 12/29/23 0634  GLUCAP 90 106* 115* 103* 80   No results found for this or any previous visit (from the past 240 hours).   Studies: No results  found.     Rosena Conradi, MD  [facility] 12/29/2023  If 7PM-7AM, please contact night-coverage

## 2023-12-29 NOTE — Progress Notes (Signed)
 Patient ID: Norman Lambert, male   DOB: [birth year], 73 y.o.   MRN: [id number] No change in exam.  We removed the Hemovac drains.  External negative pressure wound dressing in place.  No change in neurological exam.  Continue bedrest for now.  Continue IV antibiotics for now.

## 2023-12-30 ENCOUNTER — Other Ambulatory Visit (HOSPITAL_COMMUNITY): Payer: Self-pay

## 2023-12-30 DIAGNOSIS — M5440 Lumbago with sciatica, unspecified side: Secondary | ICD-10-CM | POA: Diagnosis not present

## 2023-12-30 LAB — CBC
HCT: 21.8 % — ABNORMAL LOW (ref 39.0–52.0)
Hemoglobin: 6.9 g/dL — CL (ref 13.0–17.0)
MCH: 30.4 pg (ref 26.0–34.0)
MCHC: 31.7 g/dL (ref 30.0–36.0)
MCV: 96 fL (ref 80.0–100.0)
Platelets: 201 10*3/uL (ref 150–400)
RBC: 2.27 MIL/uL — ABNORMAL LOW (ref 4.22–5.81)
RDW: 13.9 % (ref 11.5–15.5)
WBC: 9.1 10*3/uL (ref 4.0–10.5)
nRBC: 0 % (ref 0.0–0.2)

## 2023-12-30 LAB — GLUCOSE, CAPILLARY
Glucose-Capillary: 86 mg/dL (ref 70–99)
Glucose-Capillary: 93 mg/dL (ref 70–99)
Glucose-Capillary: 116 mg/dL — ABNORMAL HIGH (ref 70–99)
Glucose-Capillary: 95 mg/dL (ref 70–99)

## 2023-12-30 LAB — HEMOGLOBIN AND HEMATOCRIT, BLOOD
HCT: 25.7 % — ABNORMAL LOW (ref 39.0–52.0)
Hemoglobin: 8.4 g/dL — ABNORMAL LOW (ref 13.0–17.0)

## 2023-12-30 LAB — PREPARE RBC (CROSSMATCH)

## 2023-12-30 MED ORDER — SODIUM CHLORIDE 0.9% IV SOLUTION: Freq: Once | INTRAVENOUS | Status: AC

## 2023-12-30 NOTE — Progress Notes (Signed)
 PROGRESS NOTE  Norman Lambert:[id number] DOB: Sep 29, 1950 DOA: 12/05/2023 PCP: Patient, No Pcp Per   LOS: 25 days   Brief narrative:  73 year old male with past medical history of hypertension, gout, hyperlipidemia, hypothyroidism, sleep apnea was admitted on 4/25 for reduction and internal fixation of previous fusions involving L2-S1. Postoperative course initially seemed unremarkable but subsequently he continued to have increased pain involving the left foot seemingly not responding to as needed narcotics. Imaging of the back was unremarkable, he continued to have drain VAC in place.  Subsequently on 12/15/2023 patient had hypotension with tachycardia and fever up to 103 F.  Patient was then started on IV cefepime  and vancomycin  with concern about sepsis.  There was significant wound erythema with serosanguineous foul-smelling drainage from the surgical site.  Patient had Levophed  briefly but subsequently was weaned off    Significant hospital events 4/25 Extensive L2-S1 lumbar surgery 4/30 New onset pain  5/1 Ongoing pain with borderline blood pressure 5/2 Pain, leukocytosis fever, 5/3 Hypotension, not responding to crystalloid started on broad-spectrum antibiotics wound assessed by surgical team culture sent imaging pending ICU transfer. CT imaging of lumbar spine was negative for definitive abscess there were stable fractures at L3 and L4 with possible new fracture involving the transverse process of L2 and L3 on the left postoperative changes noted.CT angio of chest was negative for pulmonary emboli had bibasilar airspace disease the right was a little worse than the left consistent with possible pneumonia there was aortic aneurysmal dilation of the ascending aorta measuring at 4.5 cm 5/4 Weaned off norepinephrine , Blood cultures from 1 out of 2 samples growing gram-negative rods with BC ID showing both proteus and Enterobacterales, MRSA PCR pending. Narrowed to ceftriaxone . 5/5 Remains on  low dose NE. Febrile. BCx growing proteus.  5/6 Wound Cx with proteus, klebsiella aerogenes. Abx re-broadened to cefepime . 5/8 Required low dose norepi overnight but off this AM. His wound vac was changed this AM and has been having some pain 5/9 no issues overnight, denies any acute pain this a.m. 5/14-continues to have back pain. 5/17-has been advised bedrest     Assessment/Plan: Principal Problem:   Back pain Active Problems:   S/P lumbar fusion   Septic shock (HCC)   Bacteremia due to Proteus species   Pressure injury of skin   Paroxysmal A-fib (HCC)   Acute postoperative anemia due to expected blood loss   Benign prostatic hyperplasia   Gastroesophageal reflux disease   Acute metabolic encephalopathy   Polymicrobial bacterial infection  Septic shock secondary to Proteus bacteremia likely from postsurgical infection/possible pneumonia. Resolved at this time.  Surgical wound cultures grew Klebsiella aerogenes and Proteus Mirabilis. Blood cultures from 12/16/2023 with Proteus Mirabilis.  Neurosurgery on board and noted dehiscence of the surgical site.  Currently on cefepime  and daptomycin .  On midodrine .  Continue steroid taper.  ID has followed the patient during hospitalization.    VAC management as per neurosurgery.  Follow ID recommendation.  Temperature max of 98.3 degrees.  Within the last 24 hours.  Status post redo of laminectomy L3-4 with posterior fixation L2-S1 4/25 Continue pain management.  Repeat CT scan of the lumbar spine done 12/26/2023 shows fracture of the L5 pedicle.  Likely fragility fractures from osteoporosis from long-term steroids.  Currently on bedrest as per neurosurgery.  Continues to have pain.  Patient would benefit from palliative care management for overall goals of care.   Paroxysmal A-fib with RVR CHA2DS2VASC score 3. In normal sinus rhythm at this  time.  Asymptomatic.  Review of 2D echocardiogram with EF of 55 to 60%.  Currently on Lovenox .    Hyperlipidemia - Continue statin.   Essential hypertension On midodrine  due to low blood pressure.  Latest blood pressure of 109/44   Peripheral vascular disease Continue statins.   Acute metabolic encephalopathy secondary to sepsis Improving.  Communicative.  Appears slow to respond.  Continue Delirium precautions.  Appears to have intermittent involuntary movements of his chin.   Postop acute blood loss anemia -Hemoglobin currently 6.9 from 7.2 from 7.3<7.4.  Will get a 1 unit of PRBC transfusion.    BPH -Continue Flomax , finasteride .   GERD Continue PPI.   Hypothyroidism Continue Synthroid .   Hyperglycemia in the setting of steroids - Continue sliding scale insulin .  Latest POC glucose of 86  Pressure injuries, not POA, buttocks stage II, deep tissue pressure injury of the left toe, right heel deep tissue injury.  Continue wound care.  Pressure Injury 12/16/23 Buttocks Stage 2 -  Partial thickness loss of dermis presenting as a shallow open injury with a red, pink wound bed without slough. (Active)  12/16/23 1300  Location: Buttocks  Location Orientation:   Staging: Stage 2 -  Partial thickness loss of dermis presenting as a shallow open injury with a red, pink wound bed without slough.  Wound Description (Comments):   Present on Admission:      Pressure Injury 12/16/23 Toe (Comment  which one) Anterior;Left Deep Tissue Pressure Injury - Purple or maroon localized area of discolored intact skin or blood-filled blister due to damage of underlying soft tissue from pressure and/or shear. (Active)  12/16/23 1300  Location: Toe (Comment  which one)  Location Orientation: Anterior;Left  Staging: Deep Tissue Pressure Injury - Purple or maroon localized area of discolored intact skin or blood-filled blister due to damage of underlying soft tissue from pressure and/or shear.  Wound Description (Comments):   Present on Admission:      Pressure Injury 12/20/23 Heel Right Deep  Tissue Pressure Injury - Purple or maroon localized area of discolored intact skin or blood-filled blister due to damage of underlying soft tissue from pressure and/or shear. black (Active)  12/20/23 2000  Location: Heel  Location Orientation: Right  Staging: Deep Tissue Pressure Injury - Purple or maroon localized area of discolored intact skin or blood-filled blister due to damage of underlying soft tissue from pressure and/or shear.  Wound Description (Comments): black  Present on Admission: No   Debility, generalized weakness bedbound status. Discussed with patient's wife at bedside regarding patient's current situation including ongoing pain and limitations of mobility.  At this point will get palliative care consultation to define goals of care, assist with pain management   DVT prophylaxis: enoxaparin  (LOVENOX ) injection 40 mg Start: 12/29/23 1000lovenox subcu   Disposition: As per primary team.  Likely to go to skilled nursing facility.  Pending clinical improvement.  Would benefit from palliative care regarding goals of care/pain management.  Status is: Inpatient   Code Status:     Code Status: Full Code  Family Communication: Spoke with the patient's wife at bedside again today.   Procedures: Redo decompressive laminectomy L3-4 for recurrent stenosis with removal of epidural bone graft material/posterior fixation L2-S1 inclusive using Alphatec cortical pedicle screw/intertransverse arthrodesis L2-S1 inclusive using morselized autograft and allograft/exploration of fusion L3-4 with removal of instrumentation per Neurosurgery: Dr. Rochelle Chu 12/08/2023 2D echo 12/18/2023  Anti-infectives:  Daptomycin  and cefepime  IV  Anti-infectives (From admission, onward)    Start  Dose/Rate Route Frequency Ordered Stop   01/19/24 0000  levofloxacin  (LEVAQUIN ) 750 MG tablet        750 mg Oral Daily 12/28/23 0937 02/02/24 2359   01/19/24 0000  linezolid  (ZYVOX ) 600 MG tablet        600 mg  Oral 2 times daily 12/28/23 0937 02/02/24 2359   12/27/23 1100  DAPTOmycin  (CUBICIN ) IVPB 700 mg/172mL premix        8 mg/kg  92.1 kg 200 mL/hr over 30 Minutes Intravenous Daily 12/27/23 0824     12/19/23 1300  ceFEPIme  (MAXIPIME ) 2 g in sodium chloride  0.9 % 100 mL IVPB        2 g 200 mL/hr over 30 Minutes Intravenous Every 8 hours 12/19/23 1116     12/17/23 1000  vancomycin  (VANCOREADY) IVPB 1500 mg/300 mL  Status:  Discontinued       Placed in "Followed by" Linked Group   1,500 mg 150 mL/hr over 120 Minutes Intravenous Every 24 hours 12/16/23 0920 12/17/23 1009   12/17/23 1000  cefTRIAXone  (ROCEPHIN ) 2 g in sodium chloride  0.9 % 100 mL IVPB  Status:  Discontinued        2 g 200 mL/hr over 30 Minutes Intravenous Every 24 hours 12/17/23 0159 12/19/23 1116   12/16/23 1030  vancomycin  (VANCOREADY) IVPB 2000 mg/400 mL       Placed in "Followed by" Linked Group   2,000 mg 200 mL/hr over 120 Minutes Intravenous  Once 12/16/23 0920 12/16/23 1359   12/16/23 1015  ceFEPIme  (MAXIPIME ) 2 g in sodium chloride  0.9 % 100 mL IVPB  Status:  Discontinued        2 g 200 mL/hr over 30 Minutes Intravenous Every 8 hours 12/16/23 0920 12/17/23 0158   12/08/23 1900  ceFAZolin  (ANCEF ) IVPB 2g/100 mL premix        2 g 200 mL/hr over 30 Minutes Intravenous Every 8 hours 12/08/23 1721 12/09/23 0452   12/08/23 1015  ceFAZolin  (ANCEF ) IVPB 2g/100 mL premix        2 g 200 mL/hr over 30 Minutes Intravenous On call to O.R. 12/08/23 1000 12/08/23 1105   12/08/23 0952  ceFAZolin  (ANCEF ) 2-4 GM/100ML-% IVPB       Note to Pharmacy: Noelle Batten M: cabinet override      12/08/23 0952 12/08/23 1105      Subjective: Today, patient was seen and examined at bedside.  Patient's spouse at bedside.  Patient complains of pain in the back and has been on bedrest.  Does have some involuntary movements of his chin.  Slowed respond but mildly Communicative.   Objective: Vitals:   12/29/23 2340 12/30/23 0354  BP: (!)  99/47 (!) 109/44  Pulse: 68 64  Resp: 18 18  Temp: 98.3 F (36.8 C) 97.9 F (36.6 C)  SpO2: 95% 94%    Intake/Output Summary (Last 24 hours) at 12/30/2023 1027 Last data filed at 12/30/2023 0750 Gross per 24 hour  Intake 1160.83 ml  Output 1600 ml  Net -439.17 ml   Filed Weights   12/20/23 2106 12/25/23 0500 12/25/23 1316  Weight: 92.6 kg 92.1 kg 92.1 kg   Body mass index is 27.54 kg/m.   Physical Exam:  GENERAL: Patient is alert awake and Communicative with involuntary thin movements,. Not in obvious distress.  Appears weak and deconditioned. HENT: Mild pallor noted.  Pupils equally reactive to light. Oral mucosa is moist NECK: is supple, no gross swelling noted. CHEST: No crackles or wheezes.  Diminished  breath sounds bilaterally.  Wound VAC in the back. CVS: S1 and S2 heard, no murmur. Regular rate and rhythm.  ABDOMEN: Soft, non-tender, bowel sounds are present.  External urinary catheter in place. EXTREMITIES: No edema.  Right upper extremity PICC line in place. CNS: Cranial nerves are intact.  Weak lower extremities SKIN: warm and dry ,  Pressure injuries   Data Review: I have personally reviewed the following laboratory data and studies,  CBC: Recent Labs  Lab 12/25/23 0358 12/26/23 0431 12/27/23 0515 12/28/23 0658 12/29/23 0600 12/30/23 0405  WBC 10.1 13.3* 8.7 10.4 10.1 9.1  NEUTROABS 8.1* 12.4*  --   --   --   --   HGB 8.3* 7.9* 7.4* 7.3* 7.2* 6.9*  HCT 26.4* 24.9* 23.4* 23.0* 23.5* 21.8*  MCV 97.1 95.0 95.5 95.4 96.7 96.0  PLT 254 262 243 235 228 201   Basic Metabolic Panel: Recent Labs  Lab 12/24/23 0328 12/25/23 0358 12/26/23 0431 12/27/23 0515 12/28/23 0658  NA 137 136 136 136  --   K 3.9 4.0 4.6 4.0  --   CL 104 103 103 105  --   CO2 25 22 23 27   --   GLUCOSE 84 82 125* 87  --   BUN 13 13 15 15   --   CREATININE 0.67 0.62 0.70 0.79  --   CALCIUM  8.5* 8.3* 8.3* 8.2*  --   MG 1.7 1.9  --   --  1.7   Liver Function Tests: No results for  input(s): "AST", "ALT", "ALKPHOS", "BILITOT", "PROT", "ALBUMIN " in the last 168 hours. No results for input(s): "LIPASE", "AMYLASE" in the last 168 hours. No results for input(s): "AMMONIA" in the last 168 hours. Cardiac Enzymes: Recent Labs  Lab 12/28/23 0658  CKTOTAL 20*   BNP (last 3 results) Recent Labs    12/16/23 1049  BNP 274.3*    ProBNP (last 3 results) No results for input(s): "PROBNP" in the last 8760 hours.  CBG: Recent Labs  Lab 12/29/23 0634 12/29/23 1207 12/29/23 1653 12/29/23 2125 12/30/23 0619  GLUCAP 80 123* 141* 92 86   No results found for this or any previous visit (from the past 240 hours).   Studies: No results found.     Rosena Conradi, MD  [facility] 12/30/2023  If 7PM-7AM, please contact night-coverage

## 2023-12-30 NOTE — Progress Notes (Signed)
 Patient ID: Norman Lambert, male   DOB: 10-12-1950, 73 y.o.   MRN: [id number] Back pain.  He remains at bedrest.  Change in exam.  Had a long discussion with he and his wife once again.  Very difficult situation.  Continue wound VAC for now.  Continue IV antibiotics

## 2023-12-31 DIAGNOSIS — M5440 Lumbago with sciatica, unspecified side: Secondary | ICD-10-CM | POA: Diagnosis not present

## 2023-12-31 LAB — GLUCOSE, CAPILLARY
Glucose-Capillary: 76 mg/dL (ref 70–99)
Glucose-Capillary: 142 mg/dL — ABNORMAL HIGH (ref 70–99)
Glucose-Capillary: 104 mg/dL — ABNORMAL HIGH (ref 70–99)
Glucose-Capillary: 92 mg/dL (ref 70–99)

## 2023-12-31 LAB — BASIC METABOLIC PANEL WITH GFR
Anion gap: 10 (ref 5–15)
BUN: 15 mg/dL (ref 8–23)
CO2: 24 mmol/L (ref 22–32)
Calcium: 8.4 mg/dL — ABNORMAL LOW (ref 8.9–10.3)
Chloride: 105 mmol/L (ref 98–111)
Creatinine, Ser: 0.67 mg/dL (ref 0.61–1.24)
GFR, Estimated: >60 mL/min (ref >60)
Glucose, Bld: 79 mg/dL (ref 70–99)
Potassium: 3.7 mmol/L (ref 3.5–5.1)
Sodium: 139 mmol/L (ref 135–145)

## 2023-12-31 LAB — BPAM RBC
Blood Product Expiration Date: 202506102359
ISSUE DATE / TIME: 202505171131
Unit Type and Rh: 5100

## 2023-12-31 LAB — TYPE AND SCREEN
ABO/RH(D): O POS
Antibody Screen: NEGATIVE
Unit division: 0

## 2023-12-31 LAB — CBC
HCT: 23.8 % — ABNORMAL LOW (ref 39.0–52.0)
Hemoglobin: 7.9 g/dL — ABNORMAL LOW (ref 13.0–17.0)
MCH: 31.1 pg (ref 26.0–34.0)
MCHC: 33.2 g/dL (ref 30.0–36.0)
MCV: 93.7 fL (ref 80.0–100.0)
Platelets: 212 K/uL (ref 150–400)
RBC: 2.54 MIL/uL — ABNORMAL LOW (ref 4.22–5.81)
RDW: 14.3 % (ref 11.5–15.5)
WBC: 8.5 K/uL (ref 4.0–10.5)
nRBC: 0 % (ref 0.0–0.2)

## 2023-12-31 NOTE — Plan of Care (Signed)
  Problem: Health Behavior/Discharge Planning: Goal: Ability to manage health-related needs will improve Outcome: Progressing   Problem: Clinical Measurements: Goal: Ability to maintain clinical measurements within normal limits will improve Outcome: Progressing Goal: Diagnostic test results will improve Outcome: Progressing Goal: Respiratory complications will improve Outcome: Progressing Goal: Cardiovascular complication will be avoided Outcome: Progressing   Problem: Nutrition: Goal: Adequate nutrition will be maintained Outcome: Progressing   Problem: Elimination: Goal: Will not experience complications related to bowel motility Outcome: Progressing   Problem: Pain Managment: Goal: General experience of comfort will improve and/or be controlled Outcome: Progressing   Problem: Safety: Goal: Ability to remain free from injury will improve Outcome: Progressing   Problem: Skin Integrity: Goal: Risk for impaired skin integrity will decrease Outcome: Progressing   Problem: Education: Goal: Ability to verbalize activity precautions or restrictions will improve Outcome: Progressing Goal: Knowledge of the prescribed therapeutic regimen will improve Outcome: Progressing Goal: Understanding of discharge needs will improve Outcome: Progressing   Problem: Activity: Goal: Ability to avoid complications of mobility impairment will improve Outcome: Progressing Goal: Ability to tolerate increased activity will improve Outcome: Progressing Goal: Will remain free from falls Outcome: Progressing

## 2023-12-31 NOTE — Progress Notes (Signed)
 Patient ID: Norman Lambert, male   DOB: [birth year], 73 y.o.   MRN: [id number] He remains at bedrest with wound VAC in place with IV antibiotics.  We may consider removing the [facility] tomorrow and starting to mobilize him.  Again, very difficult situation.  Very concerned about him.  I have spoken with the wife at length.

## 2023-12-31 NOTE — Progress Notes (Addendum)
 PROGRESS NOTE  Norman Lambert:[id number] DOB: Mar 31, 1951 DOA: 12/05/2023 PCP: Patient, No Pcp Per   LOS: 26 days   Brief narrative:  73 year old male with past medical history of hypertension, gout, hyperlipidemia, hypothyroidism, sleep apnea was admitted on 4/25 for reduction and internal fixation of previous fusions involving L2-S1. Postoperative course initially seemed unremarkable but subsequently he continued to have increased pain involving the left foot seemingly not responding to as needed narcotics. Imaging of the back was unremarkable, he continued to have drain VAC in place.  Subsequently on 12/15/2023 patient had hypotension with tachycardia and fever up to 103 F.  Patient was then started on IV cefepime  and vancomycin  with concern about sepsis.  There was significant wound erythema with serosanguineous foul-smelling drainage from the surgical site.  Patient had Levophed  briefly but subsequently was weaned off    Significant hospital events 4/25 Extensive L2-S1 lumbar surgery 4/30 New onset pain  5/1 Ongoing pain with borderline blood pressure 5/2 Pain, leukocytosis fever, 5/3 Hypotension, not responding to crystalloid started on broad-spectrum antibiotics wound assessed by surgical team culture sent imaging pending ICU transfer. CT imaging of lumbar spine was negative for definitive abscess there were stable fractures at L3 and L4 with possible new fracture involving the transverse process of L2 and L3 on the left postoperative changes noted.CT angio of chest was negative for pulmonary emboli had bibasilar airspace disease the right was a little worse than the left consistent with possible pneumonia there was aortic aneurysmal dilation of the ascending aorta measuring at 4.5 cm 5/4 Weaned off norepinephrine , Blood cultures from 1 out of 2 samples growing gram-negative rods with BC ID showing both proteus and Enterobacterales, MRSA PCR pending. Narrowed to ceftriaxone . 5/5 Remains on  low dose NE. Febrile. BCx growing proteus.  5/6 Wound Cx with proteus, klebsiella aerogenes. Abx re-broadened to cefepime . 5/8 Required low dose norepi overnight but off this AM. His wound vac was changed this AM and has been having some pain 5/9 no issues overnight, denies any acute pain this a.m. 5/14-continues to have back pain. 5/17-has been advised bedrest     Assessment/Plan: Principal Problem:   Back pain Active Problems:   S/P lumbar fusion   Septic shock (HCC)   Bacteremia due to Proteus species   Pressure injury of skin   Paroxysmal A-fib (HCC)   Acute postoperative anemia due to expected blood loss   Benign prostatic hyperplasia   Gastroesophageal reflux disease   Acute metabolic encephalopathy   Polymicrobial bacterial infection  Septic shock secondary to Proteus bacteremia likely from postsurgical infection/possible pneumonia. Resolved at this time.  Surgical wound cultures grew Klebsiella aerogenes and Proteus Mirabilis. Blood cultures from 12/16/2023 with Proteus Mirabilis.  Neurosurgery on board and noted dehiscence of the surgical site.  Currently on cefepime  and daptomycin .  On midodrine .  Continue steroid taper.  ID has followed the patient during hospitalization.    VAC management as per neurosurgery.  Follow ID recommendation.  Temperature max of 98.9 F in the last 24 hours.  Status post redo of laminectomy L3-4 with posterior fixation L2-S1 4/25 Continue pain management.  Repeat CT scan of the lumbar spine done 12/26/2023 shows fracture of the L5 pedicle.  Likely fragility fractures from osteoporosis from long-term steroids.  Currently on bedrest as per neurosurgery.  Patient states that his pain is much better today.  Would benefit from PT OT evaluation.  Patient would benefit from palliative care management for overall goals of care.   Paroxysmal A-fib  with RVR CHA2DS2VASC score 3. In normal sinus rhythm at this time.  Asymptomatic.  Review of 2D echocardiogram with  EF of 55 to 60%.  Currently on Lovenox .   Hyperlipidemia - Continue statin.   Essential hypertension On midodrine  due to low blood pressure.  Latest blood pressure of 112/43.  Antihypertensives on hold.   Peripheral vascular disease Continue statins.   Acute metabolic encephalopathy secondary to sepsis Improving.  Communicative.  Appears slow to respond.  Continue Delirium precautions.  Appears to have intermittent involuntary movements of his chin.   Postop acute blood loss anemia   Received 1 unit of packed RBC with latest hemoglobin of 7.9 from 6.9.  No external blood loss noted.    BPH -Continue Flomax , finasteride .   GERD Continue PPI.   Hypothyroidism Continue Synthroid .   Hyperglycemia in the setting of steroids - Continue sliding scale insulin .  Latest POC glucose of 76  Pressure injuries, not POA, buttocks stage II, deep tissue pressure injury of the left toe, right heel deep tissue injury.  Continue wound care.  Pressure Injury 12/16/23 Buttocks Stage 2 -  Partial thickness loss of dermis presenting as a shallow open injury with a red, pink wound bed without slough. (Active)  12/16/23 1300  Location: Buttocks  Location Orientation:   Staging: Stage 2 -  Partial thickness loss of dermis presenting as a shallow open injury with a red, pink wound bed without slough.  Wound Description (Comments):   Present on Admission:      Pressure Injury 12/16/23 Toe (Comment  which one) Anterior;Left Deep Tissue Pressure Injury - Purple or maroon localized area of discolored intact skin or blood-filled blister due to damage of underlying soft tissue from pressure and/or shear. (Active)  12/16/23 1300  Location: Toe (Comment  which one)  Location Orientation: Anterior;Left  Staging: Deep Tissue Pressure Injury - Purple or maroon localized area of discolored intact skin or blood-filled blister due to damage of underlying soft tissue from pressure and/or shear.  Wound Description  (Comments):   Present on Admission:      Pressure Injury 12/20/23 Heel Right Deep Tissue Pressure Injury - Purple or maroon localized area of discolored intact skin or blood-filled blister due to damage of underlying soft tissue from pressure and/or shear. black (Active)  12/20/23 2000  Location: Heel  Location Orientation: Right  Staging: Deep Tissue Pressure Injury - Purple or maroon localized area of discolored intact skin or blood-filled blister due to damage of underlying soft tissue from pressure and/or shear.  Wound Description (Comments): black  Present on Admission: No   Debility, generalized weakness bedbound status. Seen by physical therapy and recommended skilled nursing facility.  Await palliative care consultation to define goals of care, assist with pain management   DVT prophylaxis: enoxaparin  (LOVENOX ) injection 40 mg Start: 12/29/23 1000lovenox subcu   Disposition: As per primary team.  Likely to go to skilled nursing facility as per PT evaluation.[name]  Pending clinical improvement.  Palliative care has been consulted to address ongoing goals of care.  Status is: Inpatient   Code Status:     Code Status: Full Code  Family Communication: Spoke with the patient's wife at bedside    Procedures: Redo decompressive laminectomy L3-4 for recurrent stenosis with removal of epidural bone graft material/posterior fixation L2-S1 inclusive using Alphatec cortical pedicle screw/intertransverse arthrodesis L2-S1 inclusive using morselized autograft and allograft/exploration of fusion L3-4 with removal of instrumentation per Neurosurgery: Dr. Rochelle Chu 12/08/2023 2D echo 12/18/2023 PRBC transfusion 1 unit  Anti-infectives:  Daptomycin  and cefepime  IV  Anti-infectives (From admission, onward)    Start     Dose/Rate Route Frequency Ordered Stop   01/19/24 0000  levofloxacin  (LEVAQUIN ) 750 MG tablet        750 mg Oral Daily 12/28/23 0937 02/02/24 2359   01/19/24 0000  linezolid   (ZYVOX ) 600 MG tablet        600 mg Oral 2 times daily 12/28/23 0937 02/02/24 2359   12/27/23 1100  DAPTOmycin  (CUBICIN ) IVPB 700 mg/136mL premix        8 mg/kg  92.1 kg 200 mL/hr over 30 Minutes Intravenous Daily 12/27/23 0824     12/19/23 1300  ceFEPIme  (MAXIPIME ) 2 g in sodium chloride  0.9 % 100 mL IVPB        2 g 200 mL/hr over 30 Minutes Intravenous Every 8 hours 12/19/23 1116     12/17/23 1000  vancomycin  (VANCOREADY) IVPB 1500 mg/300 mL  Status:  Discontinued       Placed in "Followed by" Linked Group   1,500 mg 150 mL/hr over 120 Minutes Intravenous Every 24 hours 12/16/23 0920 12/17/23 1009   12/17/23 1000  cefTRIAXone  (ROCEPHIN ) 2 g in sodium chloride  0.9 % 100 mL IVPB  Status:  Discontinued        2 g 200 mL/hr over 30 Minutes Intravenous Every 24 hours 12/17/23 0159 12/19/23 1116   12/16/23 1030  vancomycin  (VANCOREADY) IVPB 2000 mg/400 mL       Placed in "Followed by" Linked Group   2,000 mg 200 mL/hr over 120 Minutes Intravenous  Once 12/16/23 0920 12/16/23 1359   12/16/23 1015  ceFEPIme  (MAXIPIME ) 2 g in sodium chloride  0.9 % 100 mL IVPB  Status:  Discontinued        2 g 200 mL/hr over 30 Minutes Intravenous Every 8 hours 12/16/23 0920 12/17/23 0158   12/08/23 1900  ceFAZolin  (ANCEF ) IVPB 2g/100 mL premix        2 g 200 mL/hr over 30 Minutes Intravenous Every 8 hours 12/08/23 1721 12/09/23 0452   12/08/23 1015  ceFAZolin  (ANCEF ) IVPB 2g/100 mL premix        2 g 200 mL/hr over 30 Minutes Intravenous On call to O.R. 12/08/23 1000 12/08/23 1105   12/08/23 0952  ceFAZolin  (ANCEF ) 2-4 GM/100ML-% IVPB       Note to Pharmacy: Noelle Batten M: cabinet override      12/08/23 0952 12/08/23 1105      Subjective: Today, patient was seen and examined at bedside.  Patient's spouse at bedside.  Patient states that his low back pain is much better today and had a different mattress.  Denies any pain, nausea, vomiting, fever or chills.  Has had a bowel movement.  Eating some.      Objective: Vitals:   12/31/23 0358 12/31/23 0759  BP: (!) 120/50 (!) 112/43  Pulse: 71 71  Resp: 18 18  Temp: 98.3 F (36.8 C) 98.9 F (37.2 C)  SpO2: 95% 95%    Intake/Output Summary (Last 24 hours) at 12/31/2023 1031 Last data filed at 12/31/2023 0827 Gross per 24 hour  Intake 750.84 ml  Output 1500 ml  Net -749.16 ml   Filed Weights   12/20/23 2106 12/25/23 0500 12/25/23 1316  Weight: 92.6 kg 92.1 kg 92.1 kg   Body mass index is 27.54 kg/m.   Physical Exam:  GENERAL: Patient is alert awake and Communicative with mild tremor of the left upper extremity and chin.  Not in  obvious distress.  Appears weak and deconditioned. HENT: Pallor noted.  Equally reactive to light. Oral mucosa is moist NECK: is supple, no gross swelling noted. CHEST: No crackles or wheezes.  Diminished breath sounds bilaterally.  Wound VAC in the back. CVS: S1 and S2 heard, no murmur. Regular rate and rhythm.  ABDOMEN: Soft, non-tender, bowel sounds are present.  External urinary catheter in place. EXTREMITIES: No edema.  Right upper extremity PICC line in place. CNS: Cranial nerves are intact.  Weak lower extremities, left upper extremity mild tremor. SKIN: warm and dry ,  Pressure injuries   Data Review: I have personally reviewed the following laboratory data and studies,  CBC: Recent Labs  Lab 12/25/23 0358 12/26/23 0431 12/27/23 0515 12/28/23 0658 12/29/23 0600 12/30/23 0405 12/30/23 1644 12/31/23 0604  WBC 10.1 13.3* 8.7 10.4 10.1 9.1  --  8.5  NEUTROABS 8.1* 12.4*  --   --   --   --   --   --   HGB 8.3* 7.9* 7.4* 7.3* 7.2* 6.9* 8.4* 7.9*  HCT 26.4* 24.9* 23.4* 23.0* 23.5* 21.8* 25.7* 23.8*  MCV 97.1 95.0 95.5 95.4 96.7 96.0  --  93.7  PLT 254 262 243 235 228 201  --  212   Basic Metabolic Panel: Recent Labs  Lab 12/25/23 0358 12/26/23 0431 12/27/23 0515 12/28/23 0658 12/31/23 0604  NA 136 136 136  --  139  K 4.0 4.6 4.0  --  3.7  CL 103 103 105  --  105  CO2 22 23 27    --  24  GLUCOSE 82 125* 87  --  79  BUN 13 15 15   --  15  CREATININE 0.62 0.70 0.79  --  0.67  CALCIUM  8.3* 8.3* 8.2*  --  8.4*  MG 1.9  --   --  1.7  --    Liver Function Tests: No results for input(s): "AST", "ALT", "ALKPHOS", "BILITOT", "PROT", "ALBUMIN " in the last 168 hours. No results for input(s): "LIPASE", "AMYLASE" in the last 168 hours. No results for input(s): "AMMONIA" in the last 168 hours. Cardiac Enzymes: Recent Labs  Lab 12/28/23 0658  CKTOTAL 20*   BNP (last 3 results) Recent Labs    12/16/23 1049  BNP 274.3*    ProBNP (last 3 results) No results for input(s): "PROBNP" in the last 8760 hours.  CBG: Recent Labs  Lab 12/30/23 0619 12/30/23 1146 12/30/23 1606 12/30/23 2128 12/31/23 0610  GLUCAP 86 93 116* 95 76   No results found for this or any previous visit (from the past 240 hours).   Studies: No results found.     Blair Lundeen, MD  [facility] 12/31/2023  If 7PM-7AM, please contact night-coverage

## 2024-01-01 ENCOUNTER — Other Ambulatory Visit (HOSPITAL_COMMUNITY): Payer: Self-pay

## 2024-01-01 DIAGNOSIS — Z7189 Other specified counseling: Secondary | ICD-10-CM | POA: Diagnosis not present

## 2024-01-01 DIAGNOSIS — Z515 Encounter for palliative care: Secondary | ICD-10-CM | POA: Diagnosis not present

## 2024-01-01 DIAGNOSIS — M5440 Lumbago with sciatica, unspecified side: Secondary | ICD-10-CM | POA: Diagnosis not present

## 2024-01-01 DIAGNOSIS — R5381 Other malaise: Secondary | ICD-10-CM

## 2024-01-01 LAB — GLUCOSE, CAPILLARY
Glucose-Capillary: 89 mg/dL (ref 70–99)
Glucose-Capillary: 100 mg/dL — ABNORMAL HIGH (ref 70–99)
Glucose-Capillary: 113 mg/dL — ABNORMAL HIGH (ref 70–99)
Glucose-Capillary: 85 mg/dL (ref 70–99)

## 2024-01-01 NOTE — Progress Notes (Signed)
 Patient ID: Norman Lambert, male   DOB: 11-24-1950, 73 y.o.   MRN: [id number] Patient doing ok, wife states he's a little more confused today. We did d/c his wound vac and placed a mepolex dressing on the incision. Would like him to be turned diligently to keep him off the incision as much as possible. Work with therapy today.

## 2024-01-01 NOTE — Plan of Care (Signed)

## 2024-01-01 NOTE — Consult Note (Cosign Needed Addendum)
 Consultation Note Date: 01/01/2024   Patient Name: Norman Lambert  DOB: Sep 12, 1950  MRN: [id number]  Age / Sex: 73 y.o., male  PCP: Patient, No Pcp Per Referring Physician: Joaquin Mulberry, MD  Reason for Consultation: Establishing goals of care and Pain control  HPI/Patient Profile: 73 y.o. male  with past medical history of hypertension, hyperlipidemia, hypothyroidism, gout, sleep apnea admitted on 12/05/2023 with severe back pain with evidence of increasing kyphosis at L3-4 with retrolisthesis. S/P 4/25 reduction and internal fixation of previous fusions involving L2-S1. Hospitalization complicated by septic shock s/t Proteus bacteremia from postsurgical infection/possible pneumonia and ongoing back pain. Follow up scans with evidence of new fractures of lumbar spine thought to be fragility fractures due to osteoporosis and long-term steroids - no good options and not a good candidate for repeat surgical intervention.   Clinical Assessment and Goals of Care: Consult received and chart review completed. I discussed with RN. I met today with Khallid and wife [name] at bedside. Harshaan is lying on his side in bed. He slept through most of my visit. He is confused - wife reports worsening confusion and more lethargic today. He has not eaten much today.   I spent time reviewing with [name] about palliative care. We discussed inpatient palliative care support and outpatient palliative care but that is minimal support. We discussed difficult situation for her husband. [name] has good understanding of the challenges ahead. [name] shares that they are still hopeful that he can have some level of improvement but she understands the obstacles in the way. She reports that family are talking and everyone wants to give him time and every opportunity for any improvement.   [name] shares that Cordarro's pain has been well controlled for the  most part. He has worsening pain with movement - which is expected. He is having ongoing confusion which she does note seems to come along with pain medication. He is not using significant amount of PRN medications although he has not been working with therapy either.   [name] and I discuss her support and family. They have 1 daughter and 5 grandchildren. 2 grandchildren are RNs. [name] has been mostly staying at the hospital as they live >1 hr away. Cornelis talks of their dog, Annah Khat, who is staying with Carol's sister. We discussed the possibility of dog visiting the hospital as this would bring Mouhamad a lot of joy. We reviewed our pet policy. I also encouraged self care.   All questions/concerns addressed. Emotional support provided.   Primary Decision Maker NEXT OF KIN wife [name]    SUMMARY OF RECOMMENDATIONS   - Ongoing support and goals of care discussions  Code Status/Advance Care Planning: Full code - did not discuss today.   Symptom Management:  Low Back Pain: OxyCONTIN  20 mg q12h.  OxyIR 10 mg q4h PRN.  Dilaudid  0.5-1 mg IV q4h PRN.  Gabapentin  300 mg TID.  Prednisone  taper.  Consider Tylenol  1000 mg q8h.  Consider scheduled and/or PRN muscle relaxer.  Consider calcitonin nasal spray.  Consider bisphosphonate therapy.  Bowel Regimen:  Senokot BID.  Miralax  daily.   Prognosis:  Overall prognosis poor with severely declined functional status - at risk for further decline.   Discharge Planning: To Be Determined      Primary Diagnoses: Present on Admission:  Back pain   I have reviewed the medical record, interviewed the patient and family, and examined the patient. The following aspects are pertinent.  Past Medical History:  Diagnosis Date   Abnormal EKG    Arthritis    Essential hypertension 01/02/2017   GERD (gastroesophageal reflux disease)    Gout    Hyperlipidemia 01/02/2017   Hypertension    Hypothyroidism    Peripheral vascular disease (HCC)    RBBB  01/02/2017   Rotator cuff tear    Sleep apnea    Social History   Socioeconomic History   Marital status: Married    Spouse name: Not on file   Number of children: Not on file   Years of education: Not on file   Highest education level: Not on file  Occupational History   Not on file  Tobacco Use   Smoking status: Former    Current packs/day: 0.00    Average packs/day: 3.0 packs/day for 20.0 years (60.0 ttl pk-yrs)    Types: Cigarettes    Start date: 12/09/1966    Quit date: 12/09/1986    Years since quitting: 37.0   Smokeless tobacco: Never  Vaping Use   Vaping status: Some Days  Substance and Sexual Activity   Alcohol use: Yes    Comment: social   Drug use: No   Sexual activity: Not on file  Other Topics Concern   Not on file  Social History Narrative   Not on file   Social Drivers of Health   Financial Resource Strain: Low Risk  (06/05/2023)   Received [facility]   Overall Financial Resource Strain (CARDIA)    Difficulty of Paying Living Expenses: Not hard at all  Food Insecurity: No Food Insecurity (12/06/2023)   Hunger Vital Sign    Worried About Running Out of Food in the Last Year: Never true    Ran Out of Food in the Last Year: Never true  Transportation Needs: No Transportation Needs (12/06/2023)   PRAPARE - Administrator, Civil Service (Medical): No    Lack of Transportation (Non-Medical): No  Physical Activity: Unknown (06/05/2023)   [facility] from [facility]   Exercise Vital Sign    Days of Exercise per Week: 0 days    Minutes of Exercise per Session: Not on file  Stress: No Stress Concern Present (08/21/2023)   Received from [facility] - Occupational Stress Questionnaire    Feeling of Stress : Only a little  Social Connections: Moderately Isolated (12/11/2023)   Social Connection and Isolation Panel [NHANES]    Frequency of Communication with Friends and Family: Twice a week     Frequency of Social Gatherings with Friends and Family: Twice a week    Attends Religious Services: Never    Database administrator or Organizations: No    Attends Banker Meetings: Never    Marital Status: Married   History reviewed. No pertinent family history. Scheduled Meds:  vitamin C   1,000 mg Oral Daily   Chlorhexidine  Gluconate Cloth  6 each Topical Daily   cyanocobalamin   1,000 mcg Oral Daily   enoxaparin  (LOVENOX ) injection  40 mg Subcutaneous  Daily   feeding supplement  237 mL Oral BID BM   finasteride   5 mg Oral Daily   folic acid   1 mg Oral Daily   gabapentin   300 mg Oral TID   insulin  aspart  0-5 Units Subcutaneous QHS   insulin  aspart  0-9 Units Subcutaneous TID WC   levothyroxine   112 mcg Oral Q0600   lidocaine   2 patch Transdermal Q24H   loratadine   10 mg Oral Daily   magnesium  oxide  400 mg Oral BID   melatonin  3 mg Oral QHS   midodrine   10 mg Oral TID WC   modafinil   200 mg Oral Daily   oxyCODONE   20 mg Oral Q12H   pantoprazole   40 mg Oral Daily   polyethylene glycol  17 g Oral Daily   predniSONE   10 mg Oral Q breakfast   Followed by   [name] ON 01/03/2024] predniSONE   5 mg Oral Q breakfast   Followed by   [name] ON 01/10/2024] predniSONE   2.5 mg Oral Q breakfast   rosuvastatin   5 mg Oral Daily   senna  1 tablet Oral BID   sodium chloride  flush  3 mL Intravenous Q12H   sodium chloride  flush  3 mL Intravenous Q12H   sodium chloride  flush  3-10 mL Intravenous Q12H   zinc  sulfate (50mg  elemental zinc )  220 mg Oral Daily   Continuous Infusions:  ceFEPime  (MAXIPIME ) IV 2 g (01/01/24 0500)   DAPTOmycin  700 mg (12/31/23 1418)   PRN Meds:.acetaminophen  **OR** acetaminophen , bisacodyl , HYDROmorphone  (DILAUDID ) injection, ipratropium-albuterol , menthol -cetylpyridinium **OR** phenol, ondansetron  **OR** ondansetron  (ZOFRAN ) IV, oxyCODONE , sodium chloride  flush, sodium chloride  flush, sodium chloride  flush, sodium chloride  flush Allergies  Allergen  Reactions   Benzoic Acid Hives and Rash    Positive Patch Test 11/12/20   Imidurea Rash    Positive Patch Test 11/12/20   Urea  Rash    Positive Patch Test 11/12/20   Other Rash and Other (See Comments)    " DARK DYES " [ UNSPECIFIED DYE ]   Review of Systems  Unable to perform ROS: Acuity of condition    Physical Exam Vitals and nursing note reviewed.  Constitutional:      General: He is sleeping. He is not in acute distress.    Appearance: He is ill-appearing.  Cardiovascular:     Rate and Rhythm: Normal rate.  Pulmonary:     Effort: No tachypnea, accessory muscle usage or respiratory distress.  Abdominal:     Palpations: Abdomen is soft.  Neurological:     Mental Status: He is lethargic and confused.     Vital Signs: BP (!) 116/44 (BP Location: Left Arm)   Pulse 77   Temp 99 F (37.2 C) (Oral)   Resp 15   Ht 6' (1.829 m)   Wt 92.1 kg   SpO2 94%   BMI 27.54 kg/m  Pain Scale: 0-10 POSS *See Group Information*: S-Acceptable,Sleep, easy to arouse Pain Score: Asleep   SpO2: SpO2: 94 % O2 Device:SpO2: 94 % O2 Flow Rate: .O2 Flow Rate (L/min): 2 L/min  IO: Intake/output summary:  Intake/Output Summary (Last 24 hours) at 01/01/2024 0901 Last data filed at 01/01/2024 0339 Gross per 24 hour  Intake 13 ml  Output 950 ml  Net -937 ml    LBM: Last BM Date : 12/31/23 Baseline Weight: Weight: 103 kg Most recent weight: Weight: 92.1 kg     Palliative Assessment/Data:    Time Total: 80 min  Greater than 50%  of this time was spent counseling and coordinating care related to the above assessment and plan.  Signed by: Vila Grayer, NP Palliative Medicine Team Pager # [phone] (M-F 8a-5p) Team Phone # [phone] (Nights/Weekends)

## 2024-01-01 NOTE — Evaluation (Signed)
 Physical Therapy Re-Evaluation Patient Details Name: Norman Lambert MRN: [id number] DOB: 1951/03/30 Today's Date: 01/01/2024  History of Present Illness  73 yo male admitted 12/05/23 with LB and severe back pain. 12/08/23 redo of laminectomy L3-4 with posterior fixation L2-S1; recovery complicated by pain and hypotension; 5/2 hypotension and tachycardia. 5/3 transfers to ICU, pressors, L2-3 TVP fx. 5/5 VAC placed. PT consulted for re-evaluation on 01/01/24. PMHx: gout, arthritis, HTN, HLD, HTN, PVD, RBBB, Rt reverse TSA, sleep apnea, 11/13/23 PLIF L3-4  Clinical Impression  Pt seen for PT re-evaluation with pt's wife & daughter present in room. Pt oriented to self & place but not age nor year. Pt denies pain but demonstrates impaired ability to follow simple commands throughout session. Pt positioned in R sidelying, attempted to assist pt with sitting EOB, multiple attempts, with pt somewhat pulling with BUE but unable to upright trunk; unable to come to sitting EOB even with max<>total assist. At this time, pt will require +2 assist to safely mobilize, even sitting EOB. Pt would benefit from ongoing PT services to progress mobility as able to reduce caregiver burden.      If plan is discharge home, recommend the following: Two people to help with walking and/or transfers;Two people to help with bathing/dressing/bathroom   Can travel by private vehicle   No    Equipment Recommendations Wheelchair (measurements PT);Hoyer lift;Hospital bed;Wheelchair cushion (measurements PT)  Recommendations for Other Services       Functional Status Assessment Patient has had a recent decline in their functional status and demonstrates the ability to make significant improvements in function in a reasonable and predictable amount of time.     Precautions / Restrictions Precautions Precautions: Back;Fall;Other (comment) Required Braces or Orthoses: Spinal Brace Spinal Brace: Lumbar corset;Applied in sitting  position Other Brace: AFO for LLE Restrictions Weight Bearing Restrictions Per Provider Order: No      Mobility  Bed Mobility   Bed Mobility: Sidelying to Sit   Sidelying to sit: HOB elevated, Used rails (unable to achieve even with max<>total assist)            Transfers                        Ambulation/Gait                  Stairs            Wheelchair Mobility     Tilt Bed    Modified Rankin (Stroke Patients Only)       Balance                                             Pertinent Vitals/Pain Pain Assessment Pain Assessment: No/denies pain    Home Living Family/patient expects to be discharged to:: Private residence Living Arrangements: Spouse/significant other Available Help at Discharge: Family;Available 24 hours/day Type of Home: House Home Access: Level entry       Home Layout: One level Home Equipment: Agricultural consultant (2 wheels);Shower seat;BSC/3in1;Cane - single point      Prior Function Prior Level of Function : Needs assist             Mobility Comments: Since back pain had worsened pt walking ~5-10 ft to transfer to w/c.       Extremity/Trunk Assessment   Upper Extremity Assessment Upper Extremity  Assessment: Generalized weakness    Lower Extremity Assessment Lower Extremity Assessment: Generalized weakness LLE Deficits / Details: per chart, hx of foot drop    Cervical / Trunk Assessment Cervical / Trunk Assessment: Back Surgery  Communication   Communication Communication: Impaired Factors Affecting Communication: Reduced clarity of speech;Hearing impaired    Cognition Arousal: Alert Behavior During Therapy: Flat affect   PT - Cognitive impairments: Memory, Problem solving, Safety/Judgement, Orientation, Awareness, Attention, Initiation, Sequencing   Orientation impairments: Time, Situation, Place                   PT - Cognition Comments: unable to recall his  current age, oriented to "" but not oriented to year Following commands: Impaired Following commands impaired: Follows one step commands with increased time, Follows one step commands inconsistently     Cueing Cueing Techniques: Verbal cues, Gestural cues, Tactile cues     General Comments      Exercises     Assessment/Plan    PT Assessment Patient needs continued PT services  PT Problem List Decreased strength;Decreased range of motion;Decreased activity tolerance;Decreased balance;Decreased mobility;Decreased coordination;Decreased knowledge of use of DME;Decreased safety awareness;Decreased knowledge of precautions;Decreased cognition;Pain       PT Treatment Interventions DME instruction;Gait training;Functional mobility training;Therapeutic activities;Therapeutic exercise;Balance training;Patient/family education;Neuromuscular re-education;Modalities;Wheelchair mobility training    PT Goals (Current goals can be found in the Care Plan section)  Acute Rehab PT Goals Patient Stated Goal: get better, walk again PT Goal Formulation: With patient/family Time For Goal Achievement: 01/15/24 Potential to Achieve Goals: Poor    Frequency Min 2X/week     Co-evaluation               AM-PAC PT "6 Clicks" Mobility  Outcome Measure Help needed turning from your back to your side while in a flat bed without using bedrails?: Total Help needed moving from lying on your back to sitting on the side of a flat bed without using bedrails?: Total Help needed moving to and from a bed to a chair (including a wheelchair)?: Total Help needed standing up from a chair using your arms (e.g., wheelchair or bedside chair)?: Total Help needed to walk in hospital room?: Total Help needed climbing 3-5 steps with a railing? : Total 6 Click Score: 6    End of Session   Activity Tolerance: Patient tolerated treatment well;Patient limited by fatigue Patient left: in bed;with call  bell/phone within reach;with family/visitor present (BLE prevalon boots donned)   PT Visit Diagnosis: Muscle weakness (generalized) (M62.81);Other abnormalities of gait and mobility (R26.89);Difficulty in walking, not elsewhere classified (R26.2)    Time: 1610-9604 PT Time Calculation (min) (ACUTE ONLY): 12 min   Charges:   PT Evaluation $PT Re-evaluation: 1 Re-eval   PT General Charges $$ ACUTE PT VISIT: 1 Visit         Emaline Handsome, PT, DPT 01/01/24, 4:14 PM   Venetta Gill 01/01/2024, 4:12 PM

## 2024-01-01 NOTE — Progress Notes (Signed)
 PROGRESS NOTE  MAYO FAULK ZOX:[id number] DOB: [birth year] DOA: 12/05/2023 PCP: Patient, No Pcp Per   LOS: 27 days   Brief narrative:  73 year old male with past medical history of hypertension, gout, hyperlipidemia, hypothyroidism, sleep apnea was admitted on 4/25 for reduction and internal fixation of previous fusions involving L2-S1. Postoperative course initially seemed unremarkable but subsequently he continued to have increased pain involving the left foot seemingly not responding to as needed narcotics. Imaging of the back was unremarkable, he continued to have drain VAC in place.  Subsequently on 12/15/2023 patient had hypotension with tachycardia and fever up to 103 F.  Patient was then started on IV cefepime  and vancomycin  with concern about sepsis.  There was significant wound erythema with serosanguineous foul-smelling drainage from the surgical site.  Patient had Levophed  briefly but subsequently was weaned off    Significant hospital events 4/25 Extensive L2-S1 lumbar surgery 4/30 New onset pain  5/1 Ongoing pain with borderline blood pressure 5/2 Pain, leukocytosis fever, 5/3 Hypotension, not responding to crystalloid started on broad-spectrum antibiotics wound assessed by surgical team culture sent imaging pending ICU transfer. CT imaging of lumbar spine was negative for definitive abscess there were stable fractures at L3 and L4 with possible new fracture involving the transverse process of L2 and L3 on the left postoperative changes noted.CT angio of chest was negative for pulmonary emboli had bibasilar airspace disease the right was a little worse than the left consistent with possible pneumonia there was aortic aneurysmal dilation of the ascending aorta measuring at 4.5 cm 5/4 Weaned off norepinephrine , Blood cultures from 1 out of 2 samples growing gram-negative rods with BC ID showing both proteus and Enterobacterales, MRSA PCR pending. Narrowed to ceftriaxone . 5/5 Remains on  low dose NE. Febrile. BCx growing proteus.  5/6 Wound Cx with proteus, klebsiella aerogenes. Abx re-broadened to cefepime . 5/8 Required low dose norepi overnight but off this AM. His wound vac was changed this AM and has been having some pain 5/9 no issues overnight, denies any acute pain this a.m. 5/14-continues to have back pain. 5/17-has been advised bedrest for back pain.       Assessment/Plan: Principal Problem:   Back pain Active Problems:   S/P lumbar fusion   Septic shock (HCC)   Bacteremia due to Proteus species   Pressure injury of skin   Paroxysmal A-fib (HCC)   Acute postoperative anemia due to expected blood loss   Benign prostatic hyperplasia   Gastroesophageal reflux disease   Acute metabolic encephalopathy   Polymicrobial bacterial infection  Septic shock secondary to Proteus bacteremia likely from postsurgical infection/possible pneumonia. Resolved at this time.  Surgical wound cultures grew Klebsiella aerogenes and Proteus Mirabilis. Blood cultures from 12/16/2023 with Proteus Mirabilis. Currently on cefepime  and daptomycin .   Continue steroid taper.  ID has followed the patient during hospitalization.    VAC management as per neurosurgery.  Temperature max of 99.9 F.  Status post redo of laminectomy L3-4 with posterior fixation L2-S1 4/25 Continue pain management.  Repeat CT scan of the lumbar spine done 12/26/2023 shows fracture of the L5 pedicle.  Likely fragility fractures from osteoporosis from long-term steroids.  Currently on steroid taper.  Currently on bedrest as per neurosurgery.  Still complains of pain today..  Would benefit from PT OT evaluation.  Patient would benefit from palliative care management for overall goals of care.  Palliative care has been consulted.   Paroxysmal A-fib with RVR CHA2DS2VASC score 3. In normal sinus rhythm at this  time.  Asymptomatic.  Review of 2D echocardiogram with EF of 55 to 60%.  Currently on Lovenox .   Hyperlipidemia -  Continue statin.   Essential hypertension On midodrine  due to low blood pressure.  Latest blood pressure of 123/43.  Hold antihypertensives.   Peripheral vascular disease Continue statins.   Acute metabolic encephalopathy secondary to sepsis Improving.  Communicative.  Slow to respond.  Continue Delirium precautions.  Appears to have intermittent involuntary movements of his chin and right upper extremity..   Postop acute blood loss anemia   Received 1 unit of packed RBC with latest hemoglobin of 7.9 from 6.9.  No external blood loss noted.    BPH -Continue Flomax , finasteride .   GERD Continue PPI.   Hypothyroidism Continue Synthroid .   Hyperglycemia in the setting of steroids - Continue sliding scale insulin .  Latest POC glucose of 100  Pressure injuries, not POA, buttocks stage II, deep tissue pressure injury of the left toe, right heel deep tissue injury.  Continue wound care.  Pressure Injury 12/16/23 Buttocks Stage 2 -  Partial thickness loss of dermis presenting as a shallow open injury with a red, pink wound bed without slough. (Active)  12/16/23 1300  Location: Buttocks  Location Orientation:   Staging: Stage 2 -  Partial thickness loss of dermis presenting as a shallow open injury with a red, pink wound bed without slough.  Wound Description (Comments):   Present on Admission:      Pressure Injury 12/16/23 Toe (Comment  which one) Anterior;Left Deep Tissue Pressure Injury - Purple or maroon localized area of discolored intact skin or blood-filled blister due to damage of underlying soft tissue from pressure and/or shear. (Active)  12/16/23 1300  Location: Toe (Comment  which one)  Location Orientation: Anterior;Left  Staging: Deep Tissue Pressure Injury - Purple or maroon localized area of discolored intact skin or blood-filled blister due to damage of underlying soft tissue from pressure and/or shear.  Wound Description (Comments):   Present on Admission:       Pressure Injury 12/20/23 Heel Right Deep Tissue Pressure Injury - Purple or maroon localized area of discolored intact skin or blood-filled blister due to damage of underlying soft tissue from pressure and/or shear. black (Active)  12/20/23 2000  Location: Heel  Location Orientation: Right  Staging: Deep Tissue Pressure Injury - Purple or maroon localized area of discolored intact skin or blood-filled blister due to damage of underlying soft tissue from pressure and/or shear.  Wound Description (Comments): black  Present on Admission: No   Debility, generalized weakness bedbound status. Seen by physical therapy and recommended skilled nursing facility.   palliative care consultation to define goals of care, assist with pain management   DVT prophylaxis: enoxaparin  (LOVENOX ) injection 40 mg Start: 12/29/23 1000lovenox subcu   Disposition: As per primary team.  Likely to go to skilled nursing facility as per PT evaluation.  Pending clinical improvement.  Palliative care has been consulted to address ongoing goals of care.  Status is: Inpatient   Code Status:     Code Status: Full Code  Family Communication: Spoke with the patient's wife at bedside again today.   Procedures: Redo decompressive laminectomy L3-4 for recurrent stenosis with removal of epidural bone graft material/posterior fixation L2-S1 inclusive using Alphatec cortical pedicle screw/intertransverse arthrodesis L2-S1 inclusive using morselized autograft and allograft/exploration of fusion L3-4 with removal of instrumentation per Neurosurgery: Dr. Rochelle Chu 12/08/2023 2D echo 12/18/2023 PRBC transfusion 1 unit  Anti-infectives:  Daptomycin  and cefepime  IV  Anti-infectives (From admission, onward)    Start     Dose/Rate Route Frequency Ordered Stop   01/19/24 0000  levofloxacin  (LEVAQUIN ) 750 MG tablet        750 mg Oral Daily 12/28/23 0937 02/02/24 2359   01/19/24 0000  linezolid  (ZYVOX ) 600 MG tablet        600 mg Oral  2 times daily 12/28/23 0937 02/02/24 2359   12/27/23 1100  DAPTOmycin  (CUBICIN ) IVPB 700 mg/15mL premix        8 mg/kg  92.1 kg 200 mL/hr over 30 Minutes Intravenous Daily 12/27/23 0824     12/19/23 1300  ceFEPIme  (MAXIPIME ) 2 g in sodium chloride  0.9 % 100 mL IVPB        2 g 200 mL/hr over 30 Minutes Intravenous Every 8 hours 12/19/23 1116     12/17/23 1000  vancomycin  (VANCOREADY) IVPB 1500 mg/300 mL  Status:  Discontinued       Placed in "Followed by" Linked Group   1,500 mg 150 mL/hr over 120 Minutes Intravenous Every 24 hours 12/16/23 0920 12/17/23 1009   12/17/23 1000  cefTRIAXone  (ROCEPHIN ) 2 g in sodium chloride  0.9 % 100 mL IVPB  Status:  Discontinued        2 g 200 mL/hr over 30 Minutes Intravenous Every 24 hours 12/17/23 0159 12/19/23 1116   12/16/23 1030  vancomycin  (VANCOREADY) IVPB 2000 mg/400 mL       Placed in "Followed by" Linked Group   2,000 mg 200 mL/hr over 120 Minutes Intravenous  Once 12/16/23 0920 12/16/23 1359   12/16/23 1015  ceFEPIme  (MAXIPIME ) 2 g in sodium chloride  0.9 % 100 mL IVPB  Status:  Discontinued        2 g 200 mL/hr over 30 Minutes Intravenous Every 8 hours 12/16/23 0920 12/17/23 0158   12/08/23 1900  ceFAZolin  (ANCEF ) IVPB 2g/100 mL premix        2 g 200 mL/hr over 30 Minutes Intravenous Every 8 hours 12/08/23 1721 12/09/23 0452   12/08/23 1015  ceFAZolin  (ANCEF ) IVPB 2g/100 mL premix        2 g 200 mL/hr over 30 Minutes Intravenous On call to O.R. 12/08/23 1000 12/08/23 1105   12/08/23 0952  ceFAZolin  (ANCEF ) 2-4 GM/100ML-% IVPB       Note to Pharmacy: Noelle Batten M: cabinet override      12/08/23 0952 12/08/23 1105      Subjective: Today, patient was seen and examined at bedside.  Patient complains of back pain today.  Patient's spouse at bedside.  Denies any nausea vomiting fever chills or rigor.  Has been having some involuntary movements of the right upper extremity and chin.   Objective: Vitals:   01/01/24 0918 01/01/24 1129   BP:  (!) 123/43  Pulse: 76 95  Resp:    Temp:  99.3 F (37.4 C)  SpO2:  98%    Intake/Output Summary (Last 24 hours) at 01/01/2024 1154 Last data filed at 01/01/2024 0339 Gross per 24 hour  Intake 13 ml  Output 950 ml  Net -937 ml   Filed Weights   12/20/23 2106 12/25/23 0500 12/25/23 1316  Weight: 92.6 kg 92.1 kg 92.1 kg   Body mass index is 27.54 kg/m.   Physical Exam:  General:  Average built, not in obvious distress, appears weak and deconditioned.  Communicative.  Tremor of the right upper extremity and chin. HENT:   Pallor noted.  Oral mucosa is moist.  Chest:  Clear breath  sounds.  Diminished breath sounds bilaterally. No crackles or wheezes.  CVS: S1 &S2 heard. No murmur.  Regular rate and rhythm. Abdomen: Soft, nontender, nondistended.  Bowel sounds are heard.  Wound VAC on the back.  External urinary catheter in place Extremities: No cyanosis, clubbing or edema.  Peripheral pulses are palpable.  Right upper extremity PICC line in place Psych: Alert, awake and oriented, normal mood CNS:  No cranial nerve deficits.  Moves all 4 generalized weakness, right upper extremity tremor Skin: Warm and dry.  Pressure injuries noted  Data Review: I have personally reviewed the following laboratory data and studies,  CBC: Recent Labs  Lab 12/26/23 0431 12/27/23 0515 12/28/23 0658 12/29/23 0600 12/30/23 0405 12/30/23 1644 12/31/23 0604  WBC 13.3* 8.7 10.4 10.1 9.1  --  8.5  NEUTROABS 12.4*  --   --   --   --   --   --   HGB 7.9* 7.4* 7.3* 7.2* 6.9* 8.4* 7.9*  HCT 24.9* 23.4* 23.0* 23.5* 21.8* 25.7* 23.8*  MCV 95.0 95.5 95.4 96.7 96.0  --  93.7  PLT 262 243 235 228 201  --  212   Basic Metabolic Panel: Recent Labs  Lab 12/26/23 0431 12/27/23 0515 12/28/23 0658 12/31/23 0604  NA 136 136  --  139  K 4.6 4.0  --  3.7  CL 103 105  --  105  CO2 23 27  --  24  GLUCOSE 125* 87  --  79  BUN 15 15  --  15  CREATININE 0.70 0.79  --  0.67  CALCIUM  8.3* 8.2*  --  8.4*   MG  --   --  1.7  --    Liver Function Tests: No results for input(s): "AST", "ALT", "ALKPHOS", "BILITOT", "PROT", "ALBUMIN " in the last 168 hours. No results for input(s): "LIPASE", "AMYLASE" in the last 168 hours. No results for input(s): "AMMONIA" in the last 168 hours. Cardiac Enzymes: Recent Labs  Lab 12/28/23 0658  CKTOTAL 20*   BNP (last 3 results) Recent Labs    12/16/23 1049  BNP 274.3*    ProBNP (last 3 results) No results for input(s): "PROBNP" in the last 8760 hours.  CBG: Recent Labs  Lab 12/31/23 1205 12/31/23 1658 12/31/23 2121 01/01/24 0616 01/01/24 1143  GLUCAP 142* 104* 92 89 100*   No results found for this or any previous visit (from the past 240 hours).   Studies: No results found.     Damita Eppard, MD  [facility] 01/01/2024  If 7PM-7AM, please contact night-coverage

## 2024-01-02 DIAGNOSIS — Z7189 Other specified counseling: Secondary | ICD-10-CM | POA: Diagnosis not present

## 2024-01-02 DIAGNOSIS — Z515 Encounter for palliative care: Secondary | ICD-10-CM | POA: Diagnosis not present

## 2024-01-02 DIAGNOSIS — M5440 Lumbago with sciatica, unspecified side: Secondary | ICD-10-CM | POA: Diagnosis not present

## 2024-01-02 DIAGNOSIS — R5381 Other malaise: Secondary | ICD-10-CM | POA: Diagnosis not present

## 2024-01-02 LAB — CBC
HCT: 24.6 % — ABNORMAL LOW (ref 39.0–52.0)
Hemoglobin: 7.7 g/dL — ABNORMAL LOW (ref 13.0–17.0)
MCH: 29.6 pg (ref 26.0–34.0)
MCHC: 31.3 g/dL (ref 30.0–36.0)
MCV: 94.6 fL (ref 80.0–100.0)
Platelets: 212 10*3/uL (ref 150–400)
RBC: 2.6 MIL/uL — ABNORMAL LOW (ref 4.22–5.81)
RDW: 13.5 % (ref 11.5–15.5)
WBC: 8.9 10*3/uL (ref 4.0–10.5)
nRBC: 0 % (ref 0.0–0.2)

## 2024-01-02 LAB — GLUCOSE, CAPILLARY
Glucose-Capillary: 77 mg/dL (ref 70–99)
Glucose-Capillary: 76 mg/dL (ref 70–99)
Glucose-Capillary: 94 mg/dL (ref 70–99)
Glucose-Capillary: 87 mg/dL (ref 70–99)

## 2024-01-02 MED ORDER — SENNA 8.6 MG PO TABS
2.0000 | ORAL_TABLET | Freq: Two times a day (BID) | ORAL | Status: DC
  Administered 2024-01-02 – 2024-01-12 (×17): 17.2 mg via ORAL
  Filled 2024-01-02 (×18): qty 2

## 2024-01-02 MED ORDER — NAPROXEN 250 MG PO TABS
250.0000 mg | ORAL_TABLET | Freq: Two times a day (BID) | ORAL | Status: DC
  Administered 2024-01-03 – 2024-01-12 (×16): 250 mg via ORAL
  Filled 2024-01-02 (×17): qty 1

## 2024-01-02 MED ORDER — SODIUM CHLORIDE 0.9 % IV SOLN: INTRAVENOUS | Status: DC

## 2024-01-02 MED ORDER — INSULIN ASPART 100 UNIT/ML IJ SOLN
0.0000 [IU] | Freq: Four times a day (QID) | INTRAMUSCULAR | Status: DC
  Administered 2024-01-08: 1 [IU] via SUBCUTANEOUS

## 2024-01-02 MED ORDER — ACETAMINOPHEN 650 MG RE SUPP
650.0000 mg | RECTAL | Status: DC | PRN
  Administered 2024-01-03 – 2024-01-14 (×2): 650 mg via RECTAL
  Filled 2024-01-02 (×2): qty 1

## 2024-01-02 MED ORDER — CALCITONIN (SALMON) 200 UNIT/ACT NA SOLN
1.0000 | Freq: Every day | NASAL | Status: DC
  Administered 2024-01-02 – 2024-01-16 (×15): 1 via NASAL
  Filled 2024-01-02: qty 3.7

## 2024-01-02 MED ORDER — ZOLEDRONIC ACID 4 MG/100ML IV SOLN
4.0000 mg | Freq: Once | INTRAVENOUS | Status: AC
  Administered 2024-01-02: 4 mg via INTRAVENOUS
  Filled 2024-01-02 (×2): qty 100

## 2024-01-02 MED ORDER — ACETAMINOPHEN 500 MG PO TABS
1000.0000 mg | ORAL_TABLET | Freq: Three times a day (TID) | ORAL | Status: DC
  Administered 2024-01-02: 1000 mg via ORAL
  Filled 2024-01-02: qty 2

## 2024-01-02 NOTE — Progress Notes (Signed)
 PROGRESS NOTE  Norman Lambert:[id number] DOB: 07/13/1951 DOA: 12/05/2023 PCP: Patient, No Pcp Per   LOS: 28 days   Brief narrative:  73 year old male with past medical history of hypertension, gout, hyperlipidemia, hypothyroidism, sleep apnea was admitted on 4/25 for reduction and internal fixation of previous fusions involving L2-S1. Postoperative course initially seemed unremarkable but subsequently he continued to have increased pain involving the left foot seemingly not responding to as needed narcotics. Imaging of the back was unremarkable, he continued to have drain VAC in place.  Subsequently on 12/15/2023 patient had hypotension with tachycardia and fever up to 103 F.  Patient was then started on IV cefepime  and vancomycin  with concern about sepsis.  There was significant wound erythema with serosanguineous foul-smelling drainage from the surgical site.  Patient had Levophed  briefly but subsequently was weaned off    Significant hospital events 4/25 Extensive L2-S1 lumbar surgery 4/30 New onset pain  5/1 Ongoing pain with borderline blood pressure 5/2 Pain, leukocytosis fever, 5/3 Hypotension, not responding to crystalloid started on broad-spectrum antibiotics wound assessed by surgical team culture sent imaging pending ICU transfer. CT imaging of lumbar spine was negative for definitive abscess there were stable fractures at L3 and L4 with possible new fracture involving the transverse process of L2 and L3 on the left postoperative changes noted.CT angio of chest was negative for pulmonary emboli had bibasilar airspace disease the right was a little worse than the left consistent with possible pneumonia there was aortic aneurysmal dilation of the ascending aorta measuring at 4.5 cm 5/4 Weaned off norepinephrine , Blood cultures from 1 out of 2 samples growing gram-negative rods with BC ID showing both proteus and Enterobacterales, MRSA PCR pending. Narrowed to ceftriaxone . 5/5 Remains on  low dose NE. Febrile. BCx growing proteus.  5/6 Wound Cx with proteus, klebsiella aerogenes. Abx re-broadened to cefepime . 5/8 Required low dose norepi overnight but off this AM. His wound vac was changed this AM and has been having some pain 5/9 no issues overnight, denies any acute pain this a.m. 5/14-continues to have back pain. 5/17-has been advised bedrest for back pain.   5/19-removal of wound VAC.  PT evaluation.     Assessment/Plan: Principal Problem:   Back pain Active Problems:   S/P lumbar fusion   Septic shock (HCC)   Bacteremia due to Proteus species   Pressure injury of skin   Paroxysmal A-fib (HCC)   Acute postoperative anemia due to expected blood loss   Benign prostatic hyperplasia   Gastroesophageal reflux disease   Acute metabolic encephalopathy   Polymicrobial bacterial infection  Septic shock secondary to Proteus bacteremia likely from postsurgical infection/possible pneumonia. Resolved at this time.  Surgical wound cultures grew Klebsiella aerogenes and Proteus Mirabilis. Blood cultures from 12/16/2023 with Proteus Mirabilis. Currently on cefepime  and daptomycin .   Continue steroid taper.  ID has followed the patient during hospitalization.    VAC management as per neurosurgery.  Temperature max of 99.8 F.  Status post redo of laminectomy L3-4 with posterior fixation L2-S1 4/25 Continue pain management.  Repeat CT scan of the lumbar spine done 12/26/2023 shows fracture of the L5 pedicle.  Likely fragility fractures from osteoporosis from long-term steroids.  Currently on steroid taper.  Still continues to have pain on the back.  Wound VAC has been removed.  PT OT consulted.  Plan for skilled nursing facility on discharge.     Paroxysmal A-fib with RVR CHA2DS2VASC score 3. In normal sinus rhythm at this time.  Asymptomatic.  Review of 2D echocardiogram with EF of 55 to 60%.  Currently on Lovenox .   Hyperlipidemia - Continue statin.   Essential hypertension On  midodrine  due to low blood pressure.  Latest blood pressure of 124/50.  Continue to hold antihypertensives.   Peripheral vascular disease Continue statins.   Acute metabolic encephalopathy secondary to sepsis Improved.  Communicative.  Slow to respond.  Continue Delirium precautions.  Appears to have intermittent involuntary movements of his chin   Postop acute blood loss anemia   Received 1 unit of packed RBC with latest hemoglobin of 7.7 from initial 6.9.  No external blood loss noted.    BPH -Continue Flomax , finasteride .   GERD Continue PPI.   Hypothyroidism Continue Synthroid .   Hyperglycemia in the setting of steroids - Currently under control.  Continue sliding scale insulin .  Latest POC glucose of 76.  Pressure injuries, not POA, buttocks stage II, deep tissue pressure injury of the left toe, right heel deep tissue injury.  Continue wound care.  Pressure Injury 12/16/23 Buttocks Stage 2 -  Partial thickness loss of dermis presenting as a shallow open injury with a red, pink wound bed without slough. (Active)  12/16/23 1300  Location: Buttocks  Location Orientation:   Staging: Stage 2 -  Partial thickness loss of dermis presenting as a shallow open injury with a red, pink wound bed without slough.  Wound Description (Comments):   Present on Admission:      Pressure Injury 12/16/23 Toe (Comment  which one) Anterior;Left Deep Tissue Pressure Injury - Purple or maroon localized area of discolored intact skin or blood-filled blister due to damage of underlying soft tissue from pressure and/or shear. (Active)  12/16/23 1300  Location: Toe (Comment  which one)  Location Orientation: Anterior;Left  Staging: Deep Tissue Pressure Injury - Purple or maroon localized area of discolored intact skin or blood-filled blister due to damage of underlying soft tissue from pressure and/or shear.  Wound Description (Comments):   Present on Admission:      Pressure Injury 12/20/23 Heel  Right Deep Tissue Pressure Injury - Purple or maroon localized area of discolored intact skin or blood-filled blister due to damage of underlying soft tissue from pressure and/or shear. black (Active)  12/20/23 2000  Location: Heel  Location Orientation: Right  Staging: Deep Tissue Pressure Injury - Purple or maroon localized area of discolored intact skin or blood-filled blister due to damage of underlying soft tissue from pressure and/or shear.  Wound Description (Comments): black  Present on Admission: No   Debility, generalized weakness bedbound status. Seen by physical therapy and recommended skilled nursing facility.   palliative care on board for ongoing goals of care, assist with pain management   DVT prophylaxis: enoxaparin  (LOVENOX ) injection 40 mg Start: 12/29/23 1000lovenox subcu   Disposition: As per primary team.  Likely to go to skilled nursing facility as per PT evaluation.   Status is: Inpatient   Code Status:     Code Status: Full Code  Family Communication: Spoke with the patient's wife at bedside again today.   Procedures: Redo decompressive laminectomy L3-4 for recurrent stenosis with removal of epidural bone graft material/posterior fixation L2-S1 inclusive using Alphatec cortical pedicle screw/intertransverse arthrodesis L2-S1 inclusive using morselized autograft and allograft/exploration of fusion L3-4 with removal of instrumentation per Neurosurgery: Dr. Rochelle Chu 12/08/2023 2D echo 12/18/2023 PRBC transfusion 1 unit  Anti-infectives:  Daptomycin  and cefepime  IV  Anti-infectives (From admission, onward)    Start     Dose/Rate Route Frequency  Ordered Stop   01/19/24 0000  levofloxacin  (LEVAQUIN ) 750 MG tablet        750 mg Oral Daily 12/28/23 0937 02/02/24 2359   01/19/24 0000  linezolid  (ZYVOX ) 600 MG tablet        600 mg Oral 2 times daily 12/28/23 0937 02/02/24 2359   12/27/23 1100  DAPTOmycin  (CUBICIN ) IVPB 700 mg/128mL premix        8 mg/kg  92.1  kg 200 mL/hr over 30 Minutes Intravenous Daily 12/27/23 0824     12/19/23 1300  ceFEPIme  (MAXIPIME ) 2 g in sodium chloride  0.9 % 100 mL IVPB        2 g 200 mL/hr over 30 Minutes Intravenous Every 8 hours 12/19/23 1116     12/17/23 1000  vancomycin  (VANCOREADY) IVPB 1500 mg/300 mL  Status:  Discontinued       Placed in "Followed by" Linked Group   1,500 mg 150 mL/hr over 120 Minutes Intravenous Every 24 hours 12/16/23 0920 12/17/23 1009   12/17/23 1000  cefTRIAXone  (ROCEPHIN ) 2 g in sodium chloride  0.9 % 100 mL IVPB  Status:  Discontinued        2 g 200 mL/hr over 30 Minutes Intravenous Every 24 hours 12/17/23 0159 12/19/23 1116   12/16/23 1030  vancomycin  (VANCOREADY) IVPB 2000 mg/400 mL       Placed in "Followed by" Linked Group   2,000 mg 200 mL/hr over 120 Minutes Intravenous  Once 12/16/23 0920 12/16/23 1359   12/16/23 1015  ceFEPIme  (MAXIPIME ) 2 g in sodium chloride  0.9 % 100 mL IVPB  Status:  Discontinued        2 g 200 mL/hr over 30 Minutes Intravenous Every 8 hours 12/16/23 0920 12/17/23 0158   12/08/23 1900  ceFAZolin  (ANCEF ) IVPB 2g/100 mL premix        2 g 200 mL/hr over 30 Minutes Intravenous Every 8 hours 12/08/23 1721 12/09/23 0452   12/08/23 1015  ceFAZolin  (ANCEF ) IVPB 2g/100 mL premix        2 g 200 mL/hr over 30 Minutes Intravenous On call to O.R. 12/08/23 1000 12/08/23 1105   12/08/23 0952  ceFAZolin  (ANCEF ) 2-4 GM/100ML-% IVPB       Note to Pharmacy: Noelle Batten M: cabinet override      12/08/23 0952 12/08/23 1105      Subjective: Today, patient was seen and examined at bedside.  Patient complains of back pain.  Of wound VAC.  Plan for mobilization today.  Denies any nausea vomiting fever chills or rigor.   Objective: Vitals:   01/02/24 0326 01/02/24 0722  BP: (!) 155/96 (!) 124/50  Pulse: 73 73  Resp: 20 18  Temp: 99 F (37.2 C) 99.8 F (37.7 C)  SpO2: 93%     Intake/Output Summary (Last 24 hours) at 01/02/2024 1141 Last data filed at 01/02/2024  0900 Gross per 24 hour  Intake --  Output 1450 ml  Net -1450 ml   Filed Weights   12/20/23 2106 12/25/23 0500 12/25/23 1316  Weight: 92.6 kg 92.1 kg 92.1 kg   Body mass index is 27.54 kg/m.   Physical Exam:  General:  Average built, not in obvious distress, appears weak and deconditioned.  Communicative.  Tremor of the right right.  Thin. HENT:   Pallor noted.  Oral mucosa is moist.  Chest:  Clear breath sounds.  No crackles or wheezes.  CVS: S1 &S2 heard. No murmur.  Regular rate and rhythm. Abdomen: Soft, nontender, nondistended.  Bowel  sounds are heard.  External urinary catheter in place Extremities: No cyanosis, clubbing or edema.  Peripheral pulses are palpable.  Right upper extremity PICC line in place Psych: Alert, awake and Communicative. CNS:  No cranial nerve deficits.  Moves all 4 extremities but with generalized weakness, tremor of the Chin. Skin: Warm and dry.  Pressure injuries noted  Data Review: I have personally reviewed the following laboratory data and studies,  CBC: Recent Labs  Lab 12/28/23 0658 12/29/23 0600 12/30/23 0405 12/30/23 1644 12/31/23 0604 01/02/24 0601  WBC 10.4 10.1 9.1  --  8.5 8.9  HGB 7.3* 7.2* 6.9* 8.4* 7.9* 7.7*  HCT 23.0* 23.5* 21.8* 25.7* 23.8* 24.6*  MCV 95.4 96.7 96.0  --  93.7 94.6  PLT 235 228 201  --  212 212   Basic Metabolic Panel: Recent Labs  Lab 12/27/23 0515 12/28/23 0658 12/31/23 0604  NA 136  --  139  K 4.0  --  3.7  CL 105  --  105  CO2 27  --  24  GLUCOSE 87  --  79  BUN 15  --  15  CREATININE 0.79  --  0.67  CALCIUM  8.2*  --  8.4*  MG  --  1.7  --    Liver Function Tests: No results for input(s): "AST", "ALT", "ALKPHOS", "BILITOT", "PROT", "ALBUMIN " in the last 168 hours. No results for input(s): "LIPASE", "AMYLASE" in the last 168 hours. No results for input(s): "AMMONIA" in the last 168 hours. Cardiac Enzymes: Recent Labs  Lab 12/28/23 0658  CKTOTAL 20*   BNP (last 3 results) Recent Labs     12/16/23 1049  BNP 274.3*    ProBNP (last 3 results) No results for input(s): "PROBNP" in the last 8760 hours.  CBG: Recent Labs  Lab 01/01/24 1143 01/01/24 1527 01/01/24 2130 01/02/24 0631 01/02/24 1101  GLUCAP 100* 113* 85 77 76   No results found for this or any previous visit (from the past 240 hours).   Studies: No results found.     Charvi Gammage, MD  [facility] 01/02/2024  If 7PM-7AM, please contact night-coverage

## 2024-01-02 NOTE — Evaluation (Signed)
 Clinical/Bedside Swallow Evaluation Patient Details  Name: Norman Lambert MRN: [id number] Date of Birth: Nov 03, 1950  Today's Date: 01/02/2024 Time: SLP Start Time (ACUTE ONLY): 1554 SLP Stop Time (ACUTE ONLY): 1610 SLP Time Calculation (min) (ACUTE ONLY): 16 min  Past Medical History:  Past Medical History:  Diagnosis Date   Abnormal EKG    Arthritis    Essential hypertension 01/02/2017   GERD (gastroesophageal reflux disease)    Gout    Hyperlipidemia 01/02/2017   Hypertension    Hypothyroidism    Peripheral vascular disease (HCC)    RBBB 01/02/2017   Rotator cuff tear    Sleep apnea    Past Surgical History:  Past Surgical History:  Procedure Laterality Date   APPENDECTOMY     CATARACT EXTRACTION Bilateral    EYE SURGERY     8 years ago   FEMORAL ENDARTERECTOMY Bilateral    done 15 yrs. ago at [facility]   JOINT REPLACEMENT     LAMINECTOMY WITH POSTERIOR LATERAL ARTHRODESIS LEVEL 4 N/A 12/08/2023   Procedure: Lumbar Two - Sacral Two Revision - Extension of Fusion with O-Arm;  Surgeon: Joaquin Mulberry, MD;  Location: [facility] OR;  Service: Neurosurgery;  Laterality: N/A;  L2 - S2 Revision - Extension of Fusion with O-Arm   LUMBAR LAMINECTOMY/DECOMPRESSION MICRODISCECTOMY Left 04/23/2020   Procedure: MICRO LUMBER DECOMPRESSION LUMBAR FOUR-SACRAL ONE ON LEFT AND FORAMINOTOMY LUMBAR FIVE LEFT;  Surgeon: Orvan Blanch, MD;  Location: [facility] OR;  Service: Orthopedics;  Laterality: Left;  posterior   LUMBAR WOUND DEBRIDEMENT N/A 12/25/2023   Procedure: LUMBAR WOUND IRRIGATION AND DEBRIDEMENT;  Surgeon: Joaquin Mulberry, MD;  Location: [facility] OR;  Service: Neurosurgery;  Laterality: N/A;   REVERSE SHOULDER ARTHROPLASTY Right 12/16/2016   Procedure: RIGHT REVERSE TOTAL SHOULDER ARTHROPLASTY;  Surgeon: Winston Hawking, MD;  Location: [facility] OR;  Service: Orthopedics;  Laterality: Right;   SEPTOPLASTY     TONSILLECTOMY     HPI:  DURAND WITTMEYER is a 73 yo male presenting to ED 4/22 with severe lower back pain.  Underwent laminectomy L3-4 with posterior fixation L2-S1. Recovery complicated by pain and hypotension and was transferred to ICU 5/3. PMH includes gout, arthritis, HTN, HLD, PVD, RBBB, R reverse TSA, sleep apnea    Assessment / Plan / Recommendation  Clinical Impression  RN reports increased concern with pt's swallowing after attempting to swallow pills with reported globus sensation and delayed coughing. He was asleep upon SLP arrival but roused easily. Thin liquids, purees, and solids all resulted in delayed coughing with a persistent globus sensation as he tried solids. He swallowed multiple times per bolus. A liquid wash did not alleviate this sensation. Cued coughing was weak in quality but when he coughed spontaneuosly, it seemed increasingly wet. Suspect his presentation may at least be partially representative of an esophageal component but he is also displaying signs of oropharyngeal dysphagia. Discussed with pt and his wife, who wish to proceed with an MBS. Pt was made NPO pending SLP evaluation and should remain NPO except meds crushed in puree. SLP will f/u subsequent date to complete MBS. SLP Visit Diagnosis: Dysphagia, unspecified (R13.10)    Aspiration Risk  Mild aspiration risk    Diet Recommendation NPO except meds    Medication Administration: Crushed with puree    Other  Recommendations Oral Care Recommendations: Oral care QID;Oral care prior to ice chip/H20;Staff/trained caregiver to provide oral care    Recommendations for follow up therapy are one component of a multi-disciplinary discharge planning  process, led by the attending physician.  Recommendations may be updated based on patient status, additional functional criteria and insurance authorization.  Follow up Recommendations Other (comment) (TBA)      Assistance Recommended at Discharge    Functional Status Assessment Patient has had a recent decline in their functional status and demonstrates the ability to make  significant improvements in function in a reasonable and predictable amount of time.  Frequency and Duration min 2x/week  2 weeks       Prognosis Prognosis for improved oropharyngeal function: Good Barriers to Reach Goals: Cognitive deficits      Swallow Study   General HPI: Norman Lambert is a 73 yo male presenting to ED 4/22 with severe lower back pain. Underwent laminectomy L3-4 with posterior fixation L2-S1. Recovery complicated by pain and hypotension and was transferred to ICU 5/3. PMH includes gout, arthritis, HTN, HLD, PVD, RBBB, R reverse TSA, sleep apnea Type of Study: Bedside Swallow Evaluation Previous Swallow Assessment: none in chart Diet Prior to this Study: Regular;Thin liquids (Level 0) Temperature Spikes Noted: No Respiratory Status: Room air History of Recent Intubation: No Behavior/Cognition: Alert;Cooperative Oral Cavity Assessment: Within Functional Limits Oral Care Completed by SLP: No Oral Cavity - Dentition: Adequate natural dentition Vision: Functional for self-feeding Self-Feeding Abilities: Needs assist Patient Positioning: Upright in bed Baseline Vocal Quality: Normal Volitional Cough: Weak Volitional Swallow: Able to elicit    Oral/Motor/Sensory Function Overall Oral Motor/Sensory Function: Within functional limits   Ice Chips Ice chips: Not tested   Thin Liquid Thin Liquid: Impaired Presentation: Straw Pharyngeal  Phase Impairments: Cough - Delayed    Nectar Thick Nectar Thick Liquid: Not tested   Honey Thick Honey Thick Liquid: Not tested   Puree Puree: Impaired Presentation: Spoon Pharyngeal Phase Impairments: Cough - Delayed   Solid     Solid: Impaired Pharyngeal Phase Impairments: Cough - Delayed      Amil Kale, M.A., CCC-SLP Speech Language Pathology, Acute Rehabilitation Services  Secure Chat preferred [phone]  01/02/2024,5:01 PM

## 2024-01-02 NOTE — Plan of Care (Signed)

## 2024-01-02 NOTE — TOC Progression Note (Signed)
 Transition of Care [facility]) - Progression Note    Patient Details  Name: Norman Lambert MRN: [id number] Date of Birth: 26-May-1951  Transition of [facility]) CM/SW Contact  Valery Gaucher, [place] Phone Number: 01/02/2024, 2:24 PM  Clinical Narrative:     TOC has not received any communications back form referrals sent to SNFs on 5/14. Sent email to [place] to follow up- waiting on response.   TOC will continue to follow and assist with discharge planning.   Liddie Reel, MSW, LCSW Clinical Social Worker    Expected Discharge Plan: Skilled Nursing Facility Barriers to Discharge: Continued Medical Work up  Expected Discharge Plan and Services In-house Referral: Clinical Social Work   Post Acute Care Choice: Home Health Living arrangements for the past 2 months: Single Family Home                                       Social Determinants of Health (SDOH) Interventions SDOH Screenings   Food Insecurity: No Food Insecurity (12/06/2023)  Housing: Low Risk  (12/06/2023)  Transportation Needs: No Transportation Needs (12/06/2023)  Utilities: Not At Risk (12/06/2023)  Financial Resource Strain: Low Risk  (06/05/2023)   Received from [facility]  Physical Activity: Unknown (06/05/2023)   Received from [facility]  Social Connections: Moderately Isolated (12/11/2023)  Stress: No Stress Concern Present (08/21/2023)   Received from [facility]  Tobacco Use: Medium Risk (12/25/2023)    Readmission Risk Interventions     No data to display

## 2024-01-02 NOTE — Progress Notes (Signed)
 Palliative:  HPI: 73 y.o. male  with past medical history of hypertension, hyperlipidemia, hypothyroidism, gout, sleep apnea admitted on 12/05/2023 with severe back pain with evidence of increasing kyphosis at L3-4 with retrolisthesis. S/P 4/25 reduction and internal fixation of previous fusions involving L2-S1. Hospitalization complicated by septic shock s/t Proteus bacteremia from postsurgical infection/possible pneumonia and ongoing back pain. Follow up scans with evidence of new fractures of lumbar spine thought to be fragility fractures due to osteoporosis and long-term steroids - no good options and not a good candidate for repeat surgical intervention.    I discussed and reviewed with my attending physician, Dr. Azalea Lento, regarding my recommendations for pain management.   I met today at [facility] bedside and wife Norman Lambert present. Norman Lambert is tearful - she shares it has been a difficult morning and now they are told they will be changing rooms. Norman Lambert is naturally overwhelmed with all the changes.   We did discuss potential of giving bisphosphonates and we discussed risks of jaw necrosis. Norman Lambert agrees that the benefits outweigh the risks at this stage and is willing to try anything that can assist with his discomfort and allow him even the slightest improved chance of working with therapy and trying to recover. We discussed NSAID and she agrees with this as well - no history of GIB or rectal bleeding.   Norman Lambert awakens for brief periods of times. He does smile and make a couple jokes. He does tell me he is in pain 10/10 with "gnawing" or grabbing type pain in his low back. Denies any stabbing/shooting/tingling type pain.   I called and discussed with Dr. Rochelle Chu team - Burdette Carolin. I made suggestion of naproxen and zoledronic acid and they agree with trying anything that may assist with pain and progression.   All questions/concerns addressed. Emotional support provided.   Exam: Sleepy. Awakens but struggles to remain  awake. Still some fluctuating confusion. Appropriate in conversation. No distress. Breathing regular, unlabored. Abd soft. Generalized weakness.   Plan: Ongoing goals of care conversations Low Back Pain: OxyCONTIN  20 mg q12h.  OxyIR 10 mg q4h PRN.  Dilaudid  0.5-1 mg IV q4h PRN.  Gabapentin  300 mg TID.  Tylenol  1000 mg q8h.  Naproxen 250 mg BID. Calcitonin nasal spray.  Zoledronic acid 5/20.  Prednisone  taper. May consider another burst if needed for pain control in future.  Consider scheduled and/or PRN muscle relaxer.   Bowel Regimen: LBM 5/18 Senokot increase 2 tabs BID.  Miralax  daily.   55 min  Vila Grayer, NP Palliative Medicine Team Pager [phone] (Please see [email or web address] for schedule) Team Phone [phone]

## 2024-01-02 NOTE — Progress Notes (Signed)
 OT Cancellation Note  Patient Details Name: Norman Lambert MRN: [id number] DOB: [birth year]   Cancelled Treatment:    Reason Eval/Treat Not Completed: Pain limiting ability to participate;Other (comment) (Per wife requesting to hold at this time and try tomorrow.) Erving Heather OTR/L  Acute Rehab Services  [phone] office number   Stevphen Elders 01/02/2024, 11:18 AM

## 2024-01-02 NOTE — Progress Notes (Signed)
 OT Cancellation Note  Patient Details Name: Norman Lambert MRN: [id number] DOB: 08/08/1951   Cancelled Treatment:    Reason Eval/Treat Not Completed: Pain limiting ability to participate. Will follow up.   Erving Heather OTR/L  Acute Rehab Services  [phone] office number   Stevphen Elders 01/02/2024, 9:25 AM

## 2024-01-03 ENCOUNTER — Inpatient Hospital Stay (HOSPITAL_COMMUNITY)

## 2024-01-03 DIAGNOSIS — Z7189 Other specified counseling: Secondary | ICD-10-CM | POA: Diagnosis not present

## 2024-01-03 DIAGNOSIS — R5381 Other malaise: Secondary | ICD-10-CM | POA: Diagnosis not present

## 2024-01-03 DIAGNOSIS — Z515 Encounter for palliative care: Secondary | ICD-10-CM | POA: Diagnosis not present

## 2024-01-03 DIAGNOSIS — M5440 Lumbago with sciatica, unspecified side: Secondary | ICD-10-CM | POA: Diagnosis not present

## 2024-01-03 LAB — CBC
HCT: 25.6 % — ABNORMAL LOW (ref 39.0–52.0)
Hemoglobin: 8 g/dL — ABNORMAL LOW (ref 13.0–17.0)
MCH: 30.5 pg (ref 26.0–34.0)
MCHC: 31.3 g/dL (ref 30.0–36.0)
MCV: 97.7 fL (ref 80.0–100.0)
Platelets: 225 10*3/uL (ref 150–400)
RBC: 2.62 MIL/uL — ABNORMAL LOW (ref 4.22–5.81)
RDW: 13.4 % (ref 11.5–15.5)
WBC: 8.4 10*3/uL (ref 4.0–10.5)
nRBC: 0 % (ref 0.0–0.2)

## 2024-01-03 LAB — BASIC METABOLIC PANEL WITH GFR
Anion gap: 13 (ref 5–15)
BUN: 18 mg/dL (ref 8–23)
CO2: 20 mmol/L — ABNORMAL LOW (ref 22–32)
Calcium: 8.3 mg/dL — ABNORMAL LOW (ref 8.9–10.3)
Chloride: 105 mmol/L (ref 98–111)
Creatinine, Ser: 0.84 mg/dL (ref 0.61–1.24)
GFR, Estimated: >60 mL/min (ref >60)
Glucose, Bld: 72 mg/dL (ref 70–99)
Potassium: 3.9 mmol/L (ref 3.5–5.1)
Sodium: 138 mmol/L (ref 135–145)

## 2024-01-03 LAB — GLUCOSE, CAPILLARY
Glucose-Capillary: 69 mg/dL — ABNORMAL LOW (ref 70–99)
Glucose-Capillary: 144 mg/dL — ABNORMAL HIGH (ref 70–99)
Glucose-Capillary: 69 mg/dL — ABNORMAL LOW (ref 70–99)
Glucose-Capillary: 78 mg/dL (ref 70–99)
Glucose-Capillary: 90 mg/dL (ref 70–99)
Glucose-Capillary: 126 mg/dL — ABNORMAL HIGH (ref 70–99)
Glucose-Capillary: 119 mg/dL — ABNORMAL HIGH (ref 70–99)
Glucose-Capillary: 109 mg/dL — ABNORMAL HIGH (ref 70–99)

## 2024-01-03 MED ORDER — DEXTROSE 50 % IV SOLN
1.0000 | INTRAVENOUS | Status: DC | PRN
  Administered 2024-01-03 (×2): 50 mL via INTRAVENOUS
  Filled 2024-01-03 (×2): qty 50

## 2024-01-03 MED ORDER — DEXTROSE 5 % IV SOLN: INTRAVENOUS | Status: AC

## 2024-01-03 MED ORDER — LACTATED RINGERS IV SOLN: INTRAVENOUS | Status: DC

## 2024-01-03 MED ORDER — LACTATED RINGERS IV BOLUS
1000.0000 mL | INTRAVENOUS | Status: AC
  Administered 2024-01-03: 1000 mL via INTRAVENOUS

## 2024-01-03 NOTE — Plan of Care (Signed)
  Problem: Elimination: Goal: Will not experience complications related to urinary retention Outcome: Progressing   Problem: Pain Managment: Goal: General experience of comfort will improve and/or be controlled Outcome: Progressing   Problem: Safety: Goal: Ability to remain free from injury will improve Outcome: Progressing   Problem: Activity: Goal: Ability to avoid complications of mobility impairment will improve Outcome: Progressing   Problem: Activity: Goal: Will remain free from falls Outcome: Progressing   Problem: Pain Management: Goal: Pain level will decrease Outcome: Progressing

## 2024-01-03 NOTE — Progress Notes (Signed)
 PROGRESS NOTE  Norman Lambert ZOX:[id number] DOB: 1951-03-14 DOA: 12/05/2023 PCP: Patient, No Pcp Per   LOS: 29 days   Brief narrative:  73 year old male with past medical history of hypertension, gout, hyperlipidemia, hypothyroidism, sleep apnea was admitted on 4/25 for reduction and internal fixation of previous fusions involving L2-S1. Postoperative course initially seemed unremarkable but subsequently he continued to have increased pain involving the left foot seemingly not responding to as needed narcotics. Imaging of the back was unremarkable, he continued to have drain VAC in place.  Subsequently on 12/15/2023 patient had hypotension with tachycardia and fever up to 103 F.  Patient was then started on IV cefepime  and vancomycin  with concern about sepsis.  There was significant wound erythema with serosanguineous foul-smelling drainage from the surgical site.  Patient had Levophed  briefly but subsequently was weaned off    Significant hospital events 4/25 Extensive L2-S1 lumbar surgery 4/30 New onset pain  5/1 Ongoing pain with borderline blood pressure 5/2 Pain, leukocytosis fever, 5/3 Hypotension, not responding to crystalloid started on broad-spectrum antibiotics wound assessed by surgical team culture sent imaging pending ICU transfer. CT imaging of lumbar spine was negative for definitive abscess there were stable fractures at L3 and L4 with possible new fracture involving the transverse process of L2 and L3 on the left postoperative changes noted.CT angio of chest was negative for pulmonary emboli had bibasilar airspace disease the right was a little worse than the left consistent with possible pneumonia there was aortic aneurysmal dilation of the ascending aorta measuring at 4.5 cm 5/4 Weaned off norepinephrine , Blood cultures from 1 out of 2 samples growing gram-negative rods with BC ID showing both proteus and Enterobacterales, MRSA PCR pending. Narrowed to ceftriaxone . 5/5 Remains on  low dose NE. Febrile. BCx growing proteus.  5/6 Wound Cx with proteus, klebsiella aerogenes. Abx re-broadened to cefepime . 5/8 Required low dose norepi overnight but off this AM. His wound vac was changed this AM and has been having some pain 5/9 no issues overnight, denies any acute pain this a.m. 5/14-continues to have back pain. 5/17-has been advised bedrest for back pain.   5/19-removal of wound VAC.  PT evaluation. 5/20-concerns for aspiration show speech therapy has been consulted.  Has been kept n.p.o. for modified barium swallow.     Assessment/Plan: Principal Problem:   Back pain Active Problems:   S/P lumbar fusion   Septic shock (HCC)   Bacteremia due to Proteus species   Pressure injury of skin   Paroxysmal A-fib (HCC)   Acute postoperative anemia due to expected blood loss   Benign prostatic hyperplasia   Gastroesophageal reflux disease   Acute metabolic encephalopathy   Polymicrobial bacterial infection  Septic shock secondary to Proteus bacteremia likely from postsurgical infection/possible pneumonia. Resolved at this time.  Surgical wound cultures grew Klebsiella aerogenes and Proteus Mirabilis. Blood cultures from 12/16/2023 with Proteus Mirabilis.  Was on cefepime  and daptomycin  been transitioned to linezolid  and Levaquin ..   Continue steroid taper.  ID has followed the patient during hospitalization.    VAC management as per neurosurgery.  Temperature max o 100.4 [facility]  Hypoglycemia. Noted this morning.  Needed D50 pushes.  N.p.o. currently.  Will need modified barium swallow and initiation of oral diet if tolerated.  Hold off with insulin  when NPO.  Concerns for aspiration.  Currently NPO.  Speech therapy on board.  Plan for modified barium swallow.  Continue IV fluids.  Status post redo of laminectomy L3-4 with posterior fixation L2-S1  4/25 Continue pain management.  Repeat CT scan of the lumbar spine done 12/26/2023 shows fracture of the L5 pedicle.   Likely fragility fractures from osteoporosis from long-term steroids.  Currently on steroid taper.  Still continues to have pain on the back.  Wound VAC has been removed.  PT OT consulted.  Plan for skilled nursing facility on discharge.     Paroxysmal A-fib with RVR CHA2DS2VASC score 3. In normal sinus rhythm at this time.  Asymptomatic.  Review of 2D echocardiogram with EF of 55 to 60%.  Currently on Lovenox .   Hyperlipidemia - Continue statin.   Essential hypertension On midodrine  due to low blood pressure.  Latest blood pressure of 124/50.  Continue to hold antihypertensives and midodrine  for now..   Peripheral vascular disease Continue statins when p.o. okay.   Acute metabolic encephalopathy secondary to sepsis Improved.  Communicative.  Slow to respond.  Continue Delirium precautions.  Appears to have intermittent involuntary movements of his chin   Postop acute blood loss anemia   Received 1 unit of packed RBC with latest hemoglobin of 7.7 from initial 6.9.  No external blood loss noted.    BPH -Continue Flomax , finasteride .   GERD Continue PPI.   Hypothyroidism Continue Synthroid .   Hyperglycemia in the setting of steroids - Currently undercontrol.  Continue sliding scale insulin .  Latest POC glucose of 69  Pressure injuries, not POA, buttocks stage II, deep tissue pressure injury of the left toe, right heel deep tissue injury.  Continue wound care.  Pressure Injury 12/16/23 Buttocks Stage 2 -  Partial thickness loss of dermis presenting as a shallow open injury with a red, pink wound bed without slough. (Active)  12/16/23 1300  Location: Buttocks  Location Orientation:   Staging: Stage 2 -  Partial thickness loss of dermis presenting as a shallow open injury with a red, pink wound bed without slough.  Wound Description (Comments):   Present on Admission:      Pressure Injury 12/16/23 Toe (Comment  which one) Anterior;Left Deep Tissue Pressure Injury - Purple or  maroon localized area of discolored intact skin or blood-filled blister due to damage of underlying soft tissue from pressure and/or shear. (Active)  12/16/23 1300  Location: Toe (Comment  which one)  Location Orientation: Anterior;Left  Staging: Deep Tissue Pressure Injury - Purple or maroon localized area of discolored intact skin or blood-filled blister due to damage of underlying soft tissue from pressure and/or shear.  Wound Description (Comments):   Present on Admission:      Pressure Injury 12/20/23 Heel Right Deep Tissue Pressure Injury - Purple or maroon localized area of discolored intact skin or blood-filled blister due to damage of underlying soft tissue from pressure and/or shear. black (Active)  12/20/23 2000  Location: Heel  Location Orientation: Right  Staging: Deep Tissue Pressure Injury - Purple or maroon localized area of discolored intact skin or blood-filled blister due to damage of underlying soft tissue from pressure and/or shear.  Wound Description (Comments): black  Present on Admission: No   Debility, generalized weakness bedbound status. Seen by physical therapy and recommended skilled nursing facility.   palliative care on board for ongoing goals of care, assist with pain management.  Patient has overall guarded prognosis.   DVT prophylaxis: enoxaparin  (LOVENOX ) injection 40 mg Start: 12/29/23 1000lovenox subcu   Disposition: As per primary team.  Likely to go to skilled nursing facility as per PT evaluation.   Status is: Inpatient   Code Status:  Code Status: Full Code  Family Communication: Spoke with the patient's wife at bedside again today.   Procedures: Redo decompressive laminectomy L3-4 for recurrent stenosis with removal of epidural bone graft material/posterior fixation L2-S1 inclusive using [employer] cortical pedicle screw/intertransverse arthrodesis L2-S1 inclusive using morselized autograft and allograft/exploration of fusion L3-4 with  removal of instrumentation per Neurosurgery: Dr. Rochelle Chu 12/08/2023 2D echo 12/18/2023 PRBC transfusion 1 unit  Anti-infectives:  Zyvox  and Levaquin  as per ID  Anti-infectives (From admission, onward)    Start     Dose/Rate Route Frequency Ordered Stop   01/19/24 0000  levofloxacin  (LEVAQUIN ) 750 MG tablet        750 mg Oral Daily 12/28/23 0937 02/02/24 2359   01/19/24 0000  linezolid  (ZYVOX ) 600 MG tablet        600 mg Oral 2 times daily 12/28/23 0937 02/02/24 2359   12/27/23 1100  DAPTOmycin  (CUBICIN ) IVPB 700 mg/153mL premix        8 mg/kg  92.1 kg 200 mL/hr over 30 Minutes Intravenous Daily 12/27/23 0824     12/19/23 1300  ceFEPIme  (MAXIPIME ) 2 g in sodium chloride  0.9 % 100 mL IVPB        2 g 200 mL/hr over 30 Minutes Intravenous Every 8 hours 12/19/23 1116     12/17/23 1000  vancomycin  (VANCOREADY) IVPB 1500 mg/300 mL  Status:  Discontinued       Placed in "Followed by" Linked Group   1,500 mg 150 mL/hr over 120 Minutes Intravenous Every 24 hours 12/16/23 0920 12/17/23 1009   12/17/23 1000  cefTRIAXone  (ROCEPHIN ) 2 g in sodium chloride  0.9 % 100 mL IVPB  Status:  Discontinued        2 g 200 mL/hr over 30 Minutes Intravenous Every 24 hours 12/17/23 0159 12/19/23 1116   12/16/23 1030  vancomycin  (VANCOREADY) IVPB 2000 mg/400 mL       Placed in "Followed by" Linked Group   2,000 mg 200 mL/hr over 120 Minutes Intravenous  Once 12/16/23 0920 12/16/23 1359   12/16/23 1015  ceFEPIme  (MAXIPIME ) 2 g in sodium chloride  0.9 % 100 mL IVPB  Status:  Discontinued        2 g 200 mL/hr over 30 Minutes Intravenous Every 8 hours 12/16/23 0920 12/17/23 0158   12/08/23 1900  ceFAZolin  (ANCEF ) IVPB 2g/100 mL premix        2 g 200 mL/hr over 30 Minutes Intravenous Every 8 hours 12/08/23 1721 12/09/23 0452   12/08/23 1015  ceFAZolin  (ANCEF ) IVPB 2g/100 mL premix        2 g 200 mL/hr over 30 Minutes Intravenous On call to O.R. 12/08/23 1000 12/08/23 1105   12/08/23 0952  ceFAZolin  (ANCEF ) 2-4  GM/100ML-% IVPB       Note to Pharmacy: Noelle Batten M: cabinet override      12/08/23 0952 12/08/23 1105      Subjective: Today, patient was seen and examined at bedside.  Patient with concerns for aspiration yesterday.  Currently n.p.o. for modified barium swallow.  Nursing staff reported low blood glucose level.  Patient complains of mild pain in the back.  Patient's spouse at bedside.  Objective: Vitals:   01/03/24 0414 01/03/24 0842  BP: (!) 139/47 (!) 125/41  Pulse: 67 84  Resp: 20 18  Temp: 98.6 F (37 C) 99.4 F (37.4 C)  SpO2: 100% 97%    Intake/Output Summary (Last 24 hours) at 01/03/2024 1154 Last data filed at 01/03/2024 0417 Gross per 24 hour  Intake 96.61 ml  Output 750 ml  Net -653.39 ml   Filed Weights   12/20/23 2106 12/25/23 0500 12/25/23 1316  Weight: 92.6 kg 92.1 kg 92.1 kg   Body mass index is 27.54 kg/m.   Physical Exam:  General:  Average built, not in obvious distress, appears weak and deconditioned.  Communicative.  Tremor of the chin. HENT:   Pallor noted.  Oral mucosa is moist.  Chest: Diminished CVS: S1 &S2 heard. No murmur.  Regular rate and rhythm. Abdomen: Soft, nontender, nondistended.  Bowel sounds are heard.  External urinary catheter in place Extremities: No cyanosis, clubbing or edema.  Peripheral pulses are palpable.  Right upper extremity PICC line in place Psych: Alert, awake and Communicative. CNS:  No cranial nerve deficits.  moving extremities but with generalized weakness, tremor of the Chin. Skin: Warm and dry.  Pressure injuries noted  Data Review: I have personally reviewed the following laboratory data and studies,  CBC: Recent Labs  Lab 12/29/23 0600 12/30/23 0405 12/30/23 1644 12/31/23 0604 01/02/24 0601 01/03/24 0241  WBC 10.1 9.1  --  8.5 8.9 8.4  HGB 7.2* 6.9* 8.4* 7.9* 7.7* 8.0*  HCT 23.5* 21.8* 25.7* 23.8* 24.6* 25.6*  MCV 96.7 96.0  --  93.7 94.6 97.7  PLT 228 201  --  212 212 225   Basic  Metabolic Panel: Recent Labs  Lab 12/28/23 0658 12/31/23 0604 01/03/24 0241  NA  --  139 138  K  --  3.7 3.9  CL  --  105 105  CO2  --  24 20*  GLUCOSE  --  79 72  BUN  --  15 18  CREATININE  --  0.67 0.84  CALCIUM   --  8.4* 8.3*  MG 1.7  --   --    Liver Function Tests: No results for input(s): "AST", "ALT", "ALKPHOS", "BILITOT", "PROT", "ALBUMIN " in the last 168 hours. No results for input(s): "LIPASE", "AMYLASE" in the last 168 hours. No results for input(s): "AMMONIA" in the last 168 hours. Cardiac Enzymes: Recent Labs  Lab 12/28/23 0658  CKTOTAL 20*   BNP (last 3 results) Recent Labs    12/16/23 1049  BNP 274.3*    ProBNP (last 3 results) No results for input(s): "PROBNP" in the last 8760 hours.  CBG: Recent Labs  Lab 01/02/24 1551 01/02/24 2108 01/03/24 0410 01/03/24 0446 01/03/24 0800  GLUCAP 94 87 69* 144* 69*   No results found for this or any previous visit (from the past 240 hours).   Studies: No results found.     Bharath Bernstein, MD  [facility] 01/03/2024  If 7PM-7AM, please contact night-coverage

## 2024-01-03 NOTE — Progress Notes (Signed)
 Patient is back after swallow test

## 2024-01-03 NOTE — Progress Notes (Signed)
 Modified Barium Swallow Study  Patient Details  Name: Norman Lambert MRN: [id number] Date of Birth: [birth year]  Today's Date: 01/03/2024  Modified Barium Swallow completed.  Full report located under Chart Review in the Imaging Section.  History of Present Illness Norman Lambert is a 73 yo male presenting to ED 4/22 with severe lower back pain. Underwent laminectomy L3-4 with posterior fixation L2-S1. Recovery complicated by pain and hypotension and was transferred to ICU 5/3. PMH includes gout, arthritis, HTN, HLD, PVD, RBBB, R reverse TSA, sleep apnea   Clinical Impression Pt presents with moderate oropharyngeal dysphagia characterized by mistiming. Swallow initiation is consistently triggered after the bolus collects in the pyriform sinuses. This results in spillage of the tail of the bolus as the swallow is initiated which enters the laryngeal vestibule. Thin and nectar thick liquids resulted in trace penetration that was not cleared independently (PAS 3). When the 13 mm barium tablet was introduced, pt had increased difficulty coordinating swallow initiation, resulting in silent aspiration of thin liquids (PAS 8). A cued cough effectively cleared the majority of aspirated material. This was an isolated event and did not recur in subsequent trials when pt was challenged with sequential straw sips. There is frequently a collection of BOT and vallecular residue that increases as the consistency becomes thicker. Suspect there is potential for his performance to fluctuate given mentation and stamina throughout the course of a meal. Provided education to pt and his wife regarding results and using compensatory strategies including intermittent cough, rate control, and avoiding pills with liquids. Recommend resuming regular diet with thin liquids. Pills should be given whole or crushed with puree. SLP will continue following at least briefly for ongoing assessment. Factors that may increase risk of  adverse event in presence of aspiration [name] & [name] 2021): Poor general health and/or compromised immunity;Reduced cognitive function;Limited mobility;Frail or deconditioned;Dependence for feeding and/or oral hygiene  Swallow Evaluation Recommendations Recommendations: PO diet PO Diet Recommendation: Regular;Thin liquids (Level 0) Liquid Administration via: Cup;Straw Medication Administration: Whole meds with puree Supervision: Full assist for feeding;Full supervision/cueing for swallowing strategies Swallowing strategies  : Minimize environmental distractions;Slow rate;Small bites/sips;Hard cough after swallowing Postural changes: Position pt fully upright for meals Oral care recommendations: Oral care BID (2x/day)    Amil Kale, M.A., CCC-SLP Speech Language Pathology, Acute Rehabilitation Services  Secure Chat preferred [phone]  01/03/2024,12:58 PM

## 2024-01-03 NOTE — Progress Notes (Signed)
 Palliative:  HPI: 73 y.o. male  with past medical history of hypertension, hyperlipidemia, hypothyroidism, gout, sleep apnea admitted on 12/05/2023 with severe back pain with evidence of increasing kyphosis at L3-4 with retrolisthesis. S/P 4/25 reduction and internal fixation of previous fusions involving L2-S1. Hospitalization complicated by septic shock s/t Proteus bacteremia from postsurgical infection/possible pneumonia and ongoing back pain. Follow up scans with evidence of new fractures of lumbar spine thought to be fragility fractures due to osteoporosis and long-term steroids - no good options and not a good candidate for repeat surgical intervention.    I met today at [facility] bedside. He continues to be sleepy and confused. Similar to my previous visits. He is currently NPO and awaiting SLP MBS evaluation. He is still complaining of pain but this seems consistent with previous days even with missing oxyCONTIN  last night and this morning. I will make no adjustments at this time. We will await SLP evaluation. Hopefully he can do well and we can liberalize diet. He would benefit from [employer] as he likes ice cream. He will drink some of the supplements. No changes to goals of care. Time for outcomes.   I spent time discussing with wife, [name], at bedside. She is exhausted and stressed. We discussed self care. She has not left the hospital in many days. I discussed with her the potential of her daughter and/or grandchildren staying with him this weekend so she can go home and rest in her own bed for at least one night. She will consider this. She has not felt comfortable leaving him alone as she felt when he became septic this would have escalated and he would have worsened if she had not been here to advocate for him.   All questions/concerns addressed. Emotional support provided.   Exam: Sleepy. Awakens but struggles to remain awake. Still some fluctuating confusion. Appropriate in conversation. No  distress. Breathing regular, unlabored. Abd soft. Generalized weakness.    Plan: Ongoing goals of care conversations Low Back Pain: OxyCONTIN  20 mg q12h.  OxyIR 10 mg q4h PRN.  Dilaudid  0.5-1 mg IV q4h PRN.  Gabapentin  300 mg TID.  Tylenol  1000 mg q8h.  Naproxen 250 mg BID. Calcitonin nasal spray.  Zoledronic acid 5/20.  Prednisone  taper. May consider another burst if needed for pain control in future.  Consider scheduled and/or PRN muscle relaxer.   Bowel Regimen: LBM 5/18 Senokot 2 tabs BID.  Miralax  daily.   35 min  Vila Grayer, NP Palliative Medicine Team Pager [phone] (Please see [email or web address] for schedule) Team Phone [phone]

## 2024-01-03 NOTE — Plan of Care (Signed)

## 2024-01-03 NOTE — Progress Notes (Addendum)
 Occupational Therapy Treatment Patient Details Name: Norman Lambert MRN: [id number] DOB: [birth year] Today's Date: 01/03/2024   History of present illness 73 yo male admitted 12/05/23 with LB and severe back pain. 12/08/23 redo of laminectomy L3-4 with posterior fixation L2-S1; recovery complicated by pain and hypotension; 5/2 hypotension and tachycardia. 5/3 transfers to ICU, pressors, L2-3 TVP fx. 5/5 VAC placed. PT consulted for re-evaluation on 01/01/24. PMHx: gout, arthritis, HTN, HLD, HTN, PVD, RBBB, Rt reverse TSA, sleep apnea, 11/13/23 PLIF L3-4   OT comments  Pt with limited progression toward goals this session, very lethargic and opening eyes briefly, following <25% of commands. Max +2 to move toward [facility] and to roll to L side for optimal positioning. Pillows placed on R side to prevent lateral lean. Pt more alert once visitor present in room, but still dozing off intermittently. Pt presenting with impairments listed below, will follow acutely. Patient will benefit from continued inpatient follow up therapy, <3 hours/day to maximize safety/ind with ADL/functional mobility.       If plan is discharge home, recommend the following:  Two people to help with walking and/or transfers;Two people to help with bathing/dressing/bathroom   Equipment Recommendations  None recommended by OT    Recommendations for Other Services PT consult;Rehab consult    Precautions / Restrictions Precautions Precautions: Back;Fall;Other (comment) Precaution Booklet Issued: No Precaution/Restrictions Comments: recalls 3/3 back precautions, assist for brace Required Braces or Orthoses: Spinal Brace Spinal Brace: Lumbar corset;Applied in sitting position Other Brace: AFO for LLE Restrictions Weight Bearing Restrictions Per Provider Order: No       Mobility Bed Mobility Overal bed mobility: Needs Assistance   Rolling: Max assist         General bed mobility comments: pt holding onto rail with R hand  briefly    Transfers                   General transfer comment: deferred     Balance Overall balance assessment: Needs assistance Sitting-balance support: No upper extremity supported, Feet supported Sitting balance-Leahy Scale: Fair     Standing balance support: Bilateral upper extremity supported, During functional activity, Reliant on assistive device for balance Standing balance-Leahy Scale: Poor Standing balance comment: reliant on RW                           ADL either performed or assessed with clinical judgement   ADL Overall ADL's : Needs assistance/impaired Eating/Feeding: Moderate assistance;Bed level   Grooming: Maximal assistance Grooming Details (indicate cue type and reason): spouse/therapist washing pt's face to awaken                                    Extremity/Trunk Assessment Upper Extremity Assessment Upper Extremity Assessment: Generalized weakness   Lower Extremity Assessment Lower Extremity Assessment: Generalized weakness        Vision   Vision Assessment?: No apparent visual deficits   Perception     Praxis     Communication Communication Communication: Impaired   Cognition Arousal: Lethargic Behavior During Therapy: Flat affect Cognition: No apparent impairments                               Following commands: Impaired Following commands impaired: Follows one step commands inconsistently      Cueing   Cueing  Techniques: Verbal cues, Gestural cues, Tactile cues  Exercises Other Exercises Other Exercises: pt squeezes therapist hand x2 BUE    Shoulder Instructions       General Comments VSS on RA    Pertinent Vitals/ Pain       Pain Assessment Pain Assessment: No/denies pain  Home Living                                          Prior Functioning/Environment              Frequency  Min 2X/week        Progress Toward Goals  OT  Goals(current goals can now be found in the care plan section)  Progress towards OT goals: Not progressing toward goals - comment (lethargic)  Acute Rehab OT Goals Patient Stated Goal: none stated OT Goal Formulation: With patient Time For Goal Achievement: 01/09/24 Potential to Achieve Goals: Good ADL Goals Pt Will Perform Grooming: with set-up;sitting Pt Will Perform Upper Body Bathing: with set-up;sitting Pt Will Perform Lower Body Bathing: with min assist;sit to/from stand Pt Will Transfer to Toilet: with contact guard assist;ambulating Pt Will Perform Toileting - Clothing Manipulation and hygiene: with contact guard assist;sit to/from stand  Plan      Co-evaluation                 AM-PAC OT "6 Clicks" Daily Activity     Outcome Measure   Help from another person eating meals?: A Lot Help from another person taking care of personal grooming?: A Lot Help from another person toileting, which includes using toliet, bedpan, or urinal?: Total Help from another person bathing (including washing, rinsing, drying)?: A Lot Help from another person to put on and taking off regular upper body clothing?: A Lot Help from another person to put on and taking off regular lower body clothing?: Total 6 Click Score: 10    End of Session    OT Visit Diagnosis: Unsteadiness on feet (R26.81);Muscle weakness (generalized) (M62.81);Pain   Activity Tolerance Patient limited by lethargy   Patient Left in bed;with call bell/phone within reach;with bed alarm set;with family/visitor present   Nurse Communication Mobility status        Time: 9604-5409 OT Time Calculation (min): 14 min  Charges: OT General Charges $OT Visit: 1 Visit OT Treatments $Therapeutic Activity: 8-22 mins  Licia Harl K, OTD, OTR/L SecureChat Preferred Acute Rehab [phone]   Benedict Brain Koonce 01/03/2024, 5:29 PM

## 2024-01-03 NOTE — Progress Notes (Signed)
 Patient ID: Norman Lambert, male   DOB: 24-Aug-1950, 73 y.o.   MRN: [id number] Febrile in the low 100s, more lethargic. Lung with rhonchi. Wound ok.  Will check chest xray and urine. He's on broad abx  Prognosis worsens daily. I have d/w his wife

## 2024-01-04 DIAGNOSIS — M5440 Lumbago with sciatica, unspecified side: Secondary | ICD-10-CM | POA: Diagnosis not present

## 2024-01-04 LAB — BASIC METABOLIC PANEL WITH GFR
Anion gap: 11 (ref 5–15)
BUN: 18 mg/dL (ref 8–23)
CO2: 22 mmol/L (ref 22–32)
Calcium: 8.2 mg/dL — ABNORMAL LOW (ref 8.9–10.3)
Chloride: 107 mmol/L (ref 98–111)
Creatinine, Ser: 0.75 mg/dL (ref 0.61–1.24)
GFR, Estimated: >60 mL/min (ref >60)
Glucose, Bld: 96 mg/dL (ref 70–99)
Potassium: 3.5 mmol/L (ref 3.5–5.1)
Sodium: 140 mmol/L (ref 135–145)

## 2024-01-04 LAB — MAGNESIUM: Magnesium: 1.9 mg/dL (ref 1.7–2.4)

## 2024-01-04 LAB — CBC
HCT: 24.4 % — ABNORMAL LOW (ref 39.0–52.0)
Hemoglobin: 7.5 g/dL — ABNORMAL LOW (ref 13.0–17.0)
MCH: 29.6 pg (ref 26.0–34.0)
MCHC: 30.7 g/dL (ref 30.0–36.0)
MCV: 96.4 fL (ref 80.0–100.0)
Platelets: 185 10*3/uL (ref 150–400)
RBC: 2.53 MIL/uL — ABNORMAL LOW (ref 4.22–5.81)
RDW: 13.5 % (ref 11.5–15.5)
WBC: 9.5 10*3/uL (ref 4.0–10.5)
nRBC: 0 % (ref 0.0–0.2)

## 2024-01-04 LAB — GLUCOSE, CAPILLARY
Glucose-Capillary: 105 mg/dL — ABNORMAL HIGH (ref 70–99)
Glucose-Capillary: 108 mg/dL — ABNORMAL HIGH (ref 70–99)
Glucose-Capillary: 128 mg/dL — ABNORMAL HIGH (ref 70–99)
Glucose-Capillary: 84 mg/dL (ref 70–99)
Glucose-Capillary: 86 mg/dL (ref 70–99)

## 2024-01-04 LAB — CK: Total CK: 108 U/L (ref 49–397)

## 2024-01-04 NOTE — Progress Notes (Signed)
 Speech Language Pathology Treatment: Dysphagia  Patient Details Name: Norman Lambert MRN: [id number] DOB: 1950/12/09 Today's Date: 01/04/2024 Time: 4098-1191 SLP Time Calculation (min) (ACUTE ONLY): 21 min  Assessment / Plan / Recommendation Clinical Impression  Pt's wife reports ongoing reports of globus but overall reduced coughing with PO intake. His positioning was suboptimal, especially compared to MBS yesterday during which he was sitting fully upright in a chair. Attempts at repositioning were limited by pain and optimal positioning was never achieved. Suspect this significantly impacts his presentation as there was delayed coughing following trials of thin and nectar thick liquids which was previously observed clinically but was not replicated on the MBS. Coughing with thin liquids appeared more immediate with trials of mixed consistencies. Provided education to pt and his wife regarding sitting in an upright position when eating/drinking and continuing to use a hard cough intermittently as this was an effective way to clear the airway in the isolated incident of aspiration on the MBS. His wife reports intake continues to be very limited. Recommend continuing current diet, ensuring pt sits upright (or OOB when able). Avoid mixed consistencies (including meds). Give pills crushed in puree. SLP will f/u.    HPI HPI: KRISTAN VOTTA is a 73 yo male presenting to ED 4/22 with severe lower back pain. Underwent laminectomy L3-4 with posterior fixation L2-S1. Recovery complicated by pain and hypotension and was transferred to ICU 5/3. PMH includes gout, arthritis, HTN, HLD, PVD, RBBB, R reverse TSA, sleep apnea      SLP Plan  Continue with current plan of care      Recommendations for follow up therapy are one component of a multi-disciplinary discharge planning process, led by the attending physician.  Recommendations may be updated based on patient status, additional functional criteria and  insurance authorization.    Recommendations  Diet recommendations: Regular;Thin liquid Liquids provided via: Cup;Straw Medication Administration: Crushed with puree Supervision: Staff to assist with self feeding;Full supervision/cueing for compensatory strategies Compensations: Minimize environmental distractions;Slow rate;Small sips/bites Postural Changes and/or Swallow Maneuvers: Seated upright 90 degrees;Upright 30-60 min after meal                  Oral care QID;Staff/trained caregiver to provide oral care   Frequent or constant Supervision/Assistance Dysphagia, oropharyngeal phase (R13.12)     Continue with current plan of care     Amil Kale, M.A., CCC-SLP Speech Language Pathology, Acute Rehabilitation Services  Secure Chat preferred [phone]   01/04/2024, 1:28 PM

## 2024-01-04 NOTE — Plan of Care (Signed)

## 2024-01-04 NOTE — Progress Notes (Signed)
 Subjective: Patient reports doing ok, a little more awake today   Objective: Vital signs in last 24 hours: Temp:  [97.9 F (36.6 C)-100.4 F (38 C)] 98.7 F (37.1 C) (05/22 0354) Pulse Rate:  [75-110] 76 (05/22 0354) Resp:  [16-18] 16 (05/22 0354) BP: (97-158)/(41-90) 107/51 (05/22 0354) SpO2:  [87 %-98 %] 98 % (05/22 0354)  Intake/Output from previous day: 05/21 0701 - 05/22 0700 In: 2744.5 [I.V.:1144.2; IV Piggyback:1600.4] Out: 500 [Urine:500] Intake/Output this shift: No intake/output data recorded.    Lab Results: Lab Results  Component Value Date   WBC 9.5 01/04/2024   HGB 7.5 (L) 01/04/2024   HCT 24.4 (L) 01/04/2024   MCV 96.4 01/04/2024   PLT 185 01/04/2024   Lab Results  Component Value Date   INR 1.1 12/21/2023   BMET Lab Results  Component Value Date   NA 140 01/04/2024   K 3.5 01/04/2024   CL 107 01/04/2024   CO2 22 01/04/2024   GLUCOSE 96 01/04/2024   BUN 18 01/04/2024   CREATININE 0.75 01/04/2024   CALCIUM  8.2 (L) 01/04/2024    Studies/Results: DG CHEST PORT 1 VIEW Result Date: 01/03/2024 CLINICAL DATA:  [id number] Dyspnea [id number] EXAM: PORTABLE CHEST - 1 VIEW COMPARISON:  Dec 24, 2023 FINDINGS: Right PICC terminates in the lower SVC. No focal airspace consolidation, pleural effusion, or pneumothorax. Mild cardiomegaly. Aortic atherosclerosis. Right shoulder arthroplasty. Multilevel thoracic osteophytosis. IMPRESSION: No acute cardiopulmonary abnormality. Electronically Signed   By: Norman Lambert M.D.   On: 01/03/2024 16:58   DG Swallowing Func-Speech Pathology Result Date: 01/03/2024 Table formatting from the original result was not included. Modified Barium Swallow Study Patient Details Name: Norman Lambert MRN: [id number] Date of Birth: 1951-04-24 Today's Date: 01/03/2024 HPI/PMH: HPI: Norman Lambert is a 73 yo male presenting to ED 4/22 with severe lower back pain. Underwent laminectomy L3-4 with posterior fixation L2-S1. Recovery complicated by pain  and hypotension and was transferred to ICU 5/3. PMH includes gout, arthritis, HTN, HLD, PVD, RBBB, R reverse TSA, sleep apnea Clinical Impression: Clinical Impression: Pt presents with moderate oropharyngeal dysphagia characterized by mistiming. Swallow initiation is consistently triggered after the bolus collects in the pyriform sinuses. This results in spillage of the tail of the bolus as the swallow is initiated which enters the laryngeal vestibule. Thin and nectar thick liquids resulted in trace penetration that was not cleared independently (PAS 3). When the 13 mm barium tablet was introduced, pt had increased difficulty coordinating swallow initiation, resulting in silent aspiration of thin liquids (PAS 8). A cued cough effectively cleared the majority of aspirated material. This was an isolated event and did not recur in subsequent trials when pt was challenged with sequential straw sips. There is frequently a collection of BOT and vallecular residue that increases as the consistency becomes thicker. Suspect there is potential for his performance to fluctuate given mentation and stamina throughout the course of a meal. Provided education to pt and his wife regarding results and using compensatory strategies including intermittent cough, rate control, and avoiding pills with liquids. Recommend resuming regular diet with thin liquids. Pills should be given whole or crushed with puree. SLP will continue following at least briefly for ongoing assessment. Factors that may increase risk of adverse event in presence of aspiration [name] & [name] 2021): Factors that may increase risk of adverse event in presence of aspiration [name] & [name] 2021): Poor general health and/or compromised immunity; Reduced cognitive function; Limited mobility; Frail or deconditioned; Dependence for feeding  and/or oral hygiene Recommendations/Plan: Swallowing Evaluation Recommendations Swallowing Evaluation Recommendations  Recommendations: PO diet PO Diet Recommendation: Regular; Thin liquids (Level 0) Liquid Administration via: Cup; Straw Medication Administration: Whole meds with puree Supervision: Full assist for feeding; Full supervision/cueing for swallowing strategies Swallowing strategies  : Minimize environmental distractions; Slow rate; Small bites/sips; Hard cough after swallowing Postural changes: Position pt fully upright for meals Oral care recommendations: Oral care BID (2x/day) Treatment Plan Treatment Plan Treatment recommendations: Therapy as outlined in treatment plan below Follow-up recommendations: Skilled nursing-short term rehab (<3 hours/day) Functional status assessment: Patient has had a recent decline in their functional status and demonstrates the ability to make significant improvements in function in a reasonable and predictable amount of time. Treatment frequency: Min 2x/week Treatment duration: 2 weeks Interventions: Aspiration precaution training; Compensatory techniques; Patient/family education; Trials of upgraded texture/liquids; Diet toleration management by SLP Recommendations Recommendations for follow up therapy are one component of a multi-disciplinary discharge planning process, led by the attending physician.  Recommendations may be updated based on patient status, additional functional criteria and insurance authorization. Assessment: Orofacial Exam: Orofacial Exam Oral Cavity: Oral Hygiene: WFL Oral Cavity - Dentition: Adequate natural dentition Orofacial Anatomy: WFL Oral Motor/Sensory Function: WFL Anatomy: Anatomy: Suspected cervical osteophytes; Prominent cricopharyngeus Boluses Administered: Boluses Administered Boluses Administered: Thin liquids (Level 0); Mildly thick liquids (Level 2, nectar thick); Moderately thick liquids (Level 3, honey thick); Puree; Solid  Oral Impairment Domain: Oral Impairment Domain Lip Closure: Escape progressing to mid-chin Tongue control during bolus hold:  Escape to lateral buccal cavity/floor of mouth Bolus preparation/mastication: Timely and efficient chewing and mashing Bolus transport/lingual motion: Brisk tongue motion Oral residue: Trace residue lining oral structures Location of oral residue : Tongue; Palate Initiation of pharyngeal swallow : Pyriform sinuses  Pharyngeal Impairment Domain: Pharyngeal Impairment Domain Soft palate elevation: No bolus between soft palate (SP)/pharyngeal wall (PW) Laryngeal elevation: Partial superior movement of thyroid  cartilage/partial approximation of arytenoids to epiglottic petiole Anterior hyoid excursion: Partial anterior movement Epiglottic movement: Complete inversion Laryngeal vestibule closure: Complete, no air/contrast in laryngeal vestibule Pharyngeal stripping wave : Present - complete Pharyngeal contraction (A/P view only): N/A Pharyngoesophageal segment opening: Partial distention/partial duration, partial obstruction of flow Tongue base retraction: Trace column of contrast or air between tongue base and PPW Pharyngeal residue: Collection of residue within or on pharyngeal structures Location of pharyngeal residue: Tongue base; Valleculae; Pyriform sinuses  Esophageal Impairment Domain: Esophageal Impairment Domain Esophageal clearance upright position: Complete clearance, esophageal coating Pill: Pill Consistency administered: Thin liquids (Level 0) Thin liquids (Level 0): Impaired (see clinical impressions) Penetration/Aspiration Scale Score: Penetration/Aspiration Scale Score 1.  Material does not enter airway: Moderately thick liquids (Level 3, honey thick); Puree; Solid 3.  Material enters airway, remains ABOVE vocal cords and not ejected out: Mildly thick liquids (Level 2, nectar thick) 8.  Material enters airway, passes BELOW cords without attempt by patient to eject out (silent aspiration) : Thin liquids (Level 0) (when taking the pill) Compensatory Strategies: Compensatory Strategies Compensatory  strategies: No   General Information: Caregiver present: Yes  Diet Prior to this Study: NPO   Temperature : Normal   Respiratory Status: WFL   Supplemental O2: None (Room air)   History of Recent Intubation: No  Behavior/Cognition: Alert; Cooperative Self-Feeding Abilities: Needs assist with self-feeding Baseline vocal quality/speech: Normal Volitional Cough: Able to elicit Volitional Swallow: Able to elicit Exam Limitations: No limitations Goal Planning: Prognosis for improved oropharyngeal function: Good Barriers to Reach Goals: Cognitive deficits No data recorded  Patient/Family Stated Goal: none stated Consulted and agree with results and recommendations: Patient; Family member/caregiver Pain: Pain Assessment Pain Assessment: Faces Faces Pain Scale: 4 Breathing: 0 Negative Vocalization: 0 Facial Expression: 0 Body Language: 0 Consolability: 0 PAINAD Score: 0 Pain Location: back when repositioning to chair Pain Descriptors / Indicators: Aching; Guarding Pain Intervention(s): Monitored during session End of Session: Start Time:SLP Start Time (ACUTE ONLY): 1023 Stop Time: SLP Stop Time (ACUTE ONLY): 1036 Time Calculation:SLP Time Calculation (min) (ACUTE ONLY): 13 min Charges: SLP Evaluations $ SLP Speech Visit: 1 Visit SLP Evaluations $BSS Swallow: 1 Procedure $MBS Swallow: 1 Procedure SLP visit diagnosis: SLP Visit Diagnosis: Dysphagia, oropharyngeal phase (R13.12) Past Medical History: Past Medical History: Diagnosis Date  Abnormal EKG   Arthritis   Essential hypertension 01/02/2017  GERD (gastroesophageal reflux disease)   Gout   Hyperlipidemia 01/02/2017  Hypertension   Hypothyroidism   Peripheral vascular disease (HCC)   RBBB 01/02/2017  Rotator cuff tear   Sleep apnea  Past Surgical History: Past Surgical History: Procedure Laterality Date  APPENDECTOMY    CATARACT EXTRACTION Bilateral   EYE SURGERY    8 years ago  FEMORAL ENDARTERECTOMY Bilateral   done 15 yrs. ago at [facility]  JOINT REPLACEMENT    LAMINECTOMY WITH  POSTERIOR LATERAL ARTHRODESIS LEVEL 4 N/A 12/08/2023  Procedure: Lumbar Two - Sacral Two Revision - Extension of Fusion with O-Arm;  Surgeon: Joaquin Mulberry, MD;  Location: [facility] OR;  Service: Neurosurgery;  Laterality: N/A;  L2 - S2 Revision - Extension of Fusion with O-Arm  LUMBAR LAMINECTOMY/DECOMPRESSION MICRODISCECTOMY Left 04/23/2020  Procedure: MICRO LUMBER DECOMPRESSION LUMBAR FOUR-SACRAL ONE ON LEFT AND FORAMINOTOMY LUMBAR FIVE LEFT;  Surgeon: Orvan Blanch, MD;  Location: [facility] OR;  Service: Orthopedics;  Laterality: Left;  posterior  LUMBAR WOUND DEBRIDEMENT N/A 12/25/2023  Procedure: LUMBAR WOUND IRRIGATION AND DEBRIDEMENT;  Surgeon: Joaquin Mulberry, MD;  Location[facility] OR;  Service: Neurosurgery;  Laterality: N/A;  REVERSE SHOULDER ARTHROPLASTY Right 12/16/2016  Procedure: RIGHT REVERSE TOTAL SHOULDER ARTHROPLASTY;  Surgeon: Winston Hawking, MD;  Location: [facility] OR;  Service: Orthopedics;  Laterality: Right;  SEPTOPLASTY    TONSILLECTOMY   Amil Kale, M.A., CCC-SLP Speech Language Pathology, Acute Rehabilitation Services Secure Chat preferred [phone] 01/03/2024, 1:00 PM   Assessment/Plan: S/p extensive revision of lumbar hardware with fracture complications postop. Seems to be more alert this morning. Disposition likely SNF   LOS: 30 days    Kenard Paul [facility] 01/04/2024, 8:11 AM

## 2024-01-04 NOTE — Progress Notes (Signed)
 Physical Therapy Treatment Patient Details Name: Norman Lambert MRN: [id number] DOB: 10/21/1950 Today's Date: 01/04/2024   History of Present Illness 73 yo male admitted 12/05/23 with LB and severe back pain. 12/08/23 redo of laminectomy L3-4 with posterior fixation L2-S1; recovery complicated by pain and hypotension; 5/2 hypotension and tachycardia. 5/3 transfers to ICU, pressors, L2-3 TVP fx. 5/5 VAC placed. PT consulted for re-evaluation on 01/01/24 after transfer from step-down to med-surg unit. PMHx: gout, arthritis, HTN, HLD, HTN, PVD, RBBB, Rt reverse TSA, sleep apnea, 11/13/23 PLIF L3-4.    PT Comments  Pt received in R sidelying, spouse present and encouraging, pt A&O x1-2 and agreeable to therapy session after premedication, somewhat lethargic but with good effort to participate in therapies as able. Pt needing increased time and multimodal cues to initiate and perform bed mobility and with heavy posterior lean while sitting EOB >10 mins for seated UE/LE exercises to work on seated balance, core activation and extremity strengthening. Pt with RUE tremors with activity and difficulty maintaining R grasp. Pt HR elevated to ~130 bpm with seated activity and BP 148/89 (107) while sitting EOB. Pt not yet able to attempt standing trials today, plan to attempt sit<>stand from bed with Stedy or hoyer lift OOB to cushioned chair and transfers from chair next session pending pain tolerance/alertness.    If plan is discharge home, recommend the following: Two people to help with walking and/or transfers;Two people to help with bathing/dressing/bathroom;Supervision due to cognitive status;Assistance with cooking/housework   Can travel by private vehicle     No  Equipment Recommendations  Wheelchair (measurements PT);Hoyer lift;Hospital bed;Wheelchair cushion (measurements PT)    Recommendations for Other Services       Precautions / Restrictions Precautions Precautions: Back;Fall;Other  (comment) Precaution Booklet Issued: Yes (comment) Recall of Precautions/Restrictions: Impaired Precaution/Restrictions Comments: does not state 3/3, but compliant with mobility; handout brought to room after session Required Braces or Orthoses: Spinal Brace Spinal Brace: Lumbar corset;Applied in sitting position Other Brace: AFO for LLE Restrictions Weight Bearing Restrictions Per Provider Order: No     Mobility  Bed Mobility Overal bed mobility: Needs Assistance Bed Mobility: Sidelying to Sit, Rolling Rolling: Max assist, Used rails Sidelying to sit: Max assist, +2 for physical assistance, Used rails   Sit to supine: Max assist, +2 for physical assistance, Used rails   General bed mobility comments: pt partially sidelying toward his R, able to assist in cross-body reaching and moving BLE with max multimodal cues, then pt initiating log roll to R EOB with increaesed time/effort, pt needs hand over hand assist to let go of rail with top arm (LUE) to prevent pulling himself back down. Posterior lean upon sitting EOB.    Transfers Overall transfer level: Needs assistance                 General transfer comment: posterior lean throughout sitting and tachy/lethargic with some BUE tremors, defer standing trials due to pt fatigue/max to totalA for static sitting. Pt +2 totalA for lateral scoot x1 toward HOB using bed pads.    Ambulation/Gait               General Gait Details: unable to attempt this date   Stairs             Wheelchair Mobility     Tilt Bed    Modified Rankin (Stroke Patients Only)       Balance Overall balance assessment: Needs assistance Sitting-balance support: No upper extremity supported, Feet  supported Sitting balance-Leahy Scale: Zero Sitting balance - Comments: max to totalA to prevent posterior LOB despite LUE supported by bed rail (pt not able to consistently maintain RUE grip on R bed rail due to noted tremors while  sitting) Postural control: Posterior lean     Standing balance comment: defer, pt too lethargic and seated balance too poor to safely attempt with HR tachy to 130 bpm sitting                            Communication Communication Communication: Impaired Factors Affecting Communication: Difficulty expressing self  Cognition Arousal: Lethargic Behavior During Therapy: Flat affect   PT - Cognitive impairments: Memory, Problem solving, Safety/Judgement, Orientation, Awareness, Attention, Initiation, Sequencing   Orientation impairments: Place, Time, Situation                   PT - Cognition Comments: Pt able to recall how long he has been married and his name, pt oriented to "Greenlee" but not oriented to month or even to state "may" when given hints to "look at the calendar" and "what month comes after April?" pt repetitively states "22" from calendar when looking at wall. pt also with frequent BUE tremors and difficulty maintaining RUE grasp on rail, unclear what his cognitive baseline is. Spouse was not able to specifically recall last time he stood up, although it appears he stood during PT session ~1 week prior when family was present in room as well. Following commands: Impaired Following commands impaired: Follows one step commands inconsistently, Follows one step commands with increased time    Cueing Cueing Techniques: Verbal cues, Gestural cues, Tactile cues, Visual cues  Exercises General Exercises - Lower Extremity Ankle Circles/Pumps: AROM, Right, 10 reps, Supine Long Arc Quad: AROM, Right, Left, 5 reps, Seated (not able to achieve TKE on either limb without assist, but PTA behind him assisting with trunk balance so had him do his best) Hip ABduction/ADduction: AROM, Both, Seated, 5 reps (pillow squeezes seated EOB, cues for 3-5 sec hold) Other Exercises Other Exercises: seated anterior and lateral reaching, cues for core activation to promote more  midline/upright posture, pt still tending to lean backward and does not reach full 90/90 deg seated posture but slightly improved; ~3-5 reps ea UE. Defer cross-body reaching to avoid twisting trunk.    General Comments General comments (skin integrity, edema, etc.): dressings on back appear intact, pt on air bed; brace donned while pt seated and removed prior to return to supine; HR elevated to 130 bpm with seated activity and BP 148/89 (107). SpO2 WFL on RA throughout      Pertinent Vitals/Pain Pain Assessment Pain Assessment: PAINAD Breathing: occasional labored breathing, short period of hyperventilation Negative Vocalization: none Facial Expression: sad, frightened, frown Body Language: tense, distressed pacing, fidgeting Consolability: distracted or reassured by voice/touch PAINAD Score: 4 Pain Location: back Pain Descriptors / Indicators: Aching, Guarding Pain Intervention(s): Limited activity within patient's tolerance, Monitored during session, Premedicated before session, Repositioned    Home Living                          Prior Function            PT Goals (current goals can now be found in the care plan section) Acute Rehab PT Goals Patient Stated Goal: to get better and walk again PT Goal Formulation: With patient/family Time For Goal Achievement: 01/15/24  Progress towards PT goals: Progressing toward goals    Frequency    Min 2X/week      PT Plan      Co-evaluation              AM-PAC PT "6 Clicks" Mobility   Outcome Measure  Help needed turning from your back to your side while in a flat bed without using bedrails?: Total Help needed moving from lying on your back to sitting on the side of a flat bed without using bedrails?: Total Help needed moving to and from a bed to a chair (including a wheelchair)?: Total Help needed standing up from a chair using your arms (e.g., wheelchair or bedside chair)?: Total Help needed to walk in  hospital room?: Total Help needed climbing 3-5 steps with a railing? : Total 6 Click Score: 6    End of Session Equipment Utilized During Treatment: Back brace ([place] not in place and pt SpO2 WFL on RA throughout) Activity Tolerance: Patient tolerated treatment well;Patient limited by lethargy Patient left: in bed;with call bell/phone within reach;with bed alarm set;with family/visitor present;Other (comment) (pt sidelying toward his R; PTA wrote on his board back precs and for staff to roll him Q2H) Nurse Communication: Mobility status;Need for lift equipment;Precautions PT Visit Diagnosis: Muscle weakness (generalized) (M62.81);Other abnormalities of gait and mobility (R26.89);Difficulty in walking, not elsewhere classified (R26.2) Pain - part of body:  (back)     Time: 1610-9604 PT Time Calculation (min) (ACUTE ONLY): 27 min  Charges:    $Therapeutic Exercise: 8-22 mins $Therapeutic Activity: 8-22 mins PT General Charges $$ ACUTE PT VISIT: 1 Visit                     Maylen Waltermire P., PTA Acute Rehabilitation Services Secure Chat Preferred 9a-5:30pm Office: [phone]    Mariel Shope [facility] 01/04/2024, 10:46 AM

## 2024-01-04 NOTE — Progress Notes (Signed)
 PROGRESS NOTE  Norman Lambert:[id number] DOB: 09/09/1950 DOA: 12/05/2023 PCP: Patient, No Pcp Per   LOS: 30 days   Brief narrative:  73 year old male with past medical history of hypertension, gout, hyperlipidemia, hypothyroidism, sleep apnea was admitted on 4/25 for reduction and internal fixation of previous fusions involving L2-S1. Postoperative course initially seemed unremarkable but subsequently he continued to have increased pain involving the left foot seemingly not responding to as needed narcotics. Imaging of the back was unremarkable, he continued to have drain VAC in place.  Subsequently on 12/15/2023 patient had hypotension with tachycardia and fever up to 103 F.  Patient was then started on IV cefepime  and vancomycin  with concern about sepsis.  There was significant wound erythema with serosanguineous foul-smelling drainage from the surgical site.  Patient had Levophed  briefly but subsequently was weaned off    Significant hospital events 4/25 Extensive L2-S1 lumbar surgery 4/30 New onset pain  5/1 Ongoing pain with borderline blood pressure 5/2 Pain, leukocytosis fever, 5/3 Hypotension, not responding to crystalloid started on broad-spectrum antibiotics wound assessed by surgical team culture sent imaging pending ICU transfer. CT imaging of lumbar spine was negative for definitive abscess there were stable fractures at L3 and L4 with possible new fracture involving the transverse process of L2 and L3 on the left postoperative changes noted.CT angio of chest was negative for pulmonary emboli had bibasilar airspace disease the right was a little worse than the left consistent with possible pneumonia there was aortic aneurysmal dilation of the ascending aorta measuring at 4.5 cm 5/4 Weaned off norepinephrine , Blood cultures from 1 out of 2 samples growing gram-negative rods with BC ID showing both proteus and Enterobacterales, MRSA PCR pending. Narrowed to ceftriaxone . 5/5 Remains on  low dose NE. Febrile. BCx growing proteus.  5/6 Wound Cx with proteus, klebsiella aerogenes. Abx re-broadened to cefepime . 5/8 Required low dose norepi overnight but off this AM. His wound vac was changed this AM and has been having some pain 5/9 no issues overnight, denies any acute pain this a.m. 5/14-continues to have back pain. 5/17-has been advised bedrest for back pain.   5/19-removal of wound VAC.  PT evaluation. 5/20-concerns for aspiration show speech therapy was consulted,. for modified barium swallow. 5/21-speech therapy recommend regular diet.     Assessment/Plan: Principal Problem:   Back pain Active Problems:   S/P lumbar fusion   Septic shock (HCC)   Bacteremia due to Proteus species   Pressure injury of skin   Paroxysmal A-fib (HCC)   Acute postoperative anemia due to expected blood loss   Benign prostatic hyperplasia   Gastroesophageal reflux disease   Acute metabolic encephalopathy   Polymicrobial bacterial infection  Septic shock secondary to Proteus bacteremia likely from postsurgical infection/possible pneumonia. Resolved at this time.  Surgical wound cultures grew Klebsiella aerogenes and Proteus Mirabilis. Blood cultures from 12/16/2023 with Proteus Mirabilis.  Was on cefepime  and daptomycin  been transitioned to linezolid  and Levaquin ..   Temperature max of 100.4 F.  No leukocytosis.  Chest x-ray was done yesterday without any infiltrate.  Continue steroid taper.  ID has followed the patient during hospitalization.    VAC management as per neurosurgery.  If spikes fever might need to repeat blood cultures.  Currently on 2 L of oxygen by nasal cannula.  Appears to be more alert awake today.  Continue aspiration precautions.  Hypoglycemia. Improved at this time.  Hold insulin  if not eating.  Has been started on regular diet.  Concerns for aspiration.  [name]  Speech therapy was consulted and patient has been put back on regular diet.  Status post modified barium  swallow.  Continue aspiration precaution  Status post redo of laminectomy L3-4 with posterior fixation L2-S1 4/25 Continue pain management.  Repeat CT scan of the lumbar spine done 12/26/2023 shows fracture of the L5 pedicle.  Likely fragility fractures from osteoporosis from long-term steroids.  Currently on steroid taper.  Will try to minimize narcotics to avoid sedation.  Wound VAC has been removed.  PT OT consulted.  Plan for skilled nursing facility on discharge.     Paroxysmal A-fib with RVR CHA2DS2VASC score 3. In normal sinus rhythm at this time.  Asymptomatic.  Review of 2D echocardiogram with EF of 55 to 60%.  Currently on Lovenox .   Hyperlipidemia - Continue statin.   Essential hypertension On midodrine  due to low blood pressure.  Latest blood pressure of 124/50.  Continue to hold antihypertensives and midodrine  for now..   Peripheral vascular disease Continue statins when p.o. okay.   Acute metabolic encephalopathy  Improved.  Communicative.  Continue Delirium precautions.  Appears to have intermittent involuntary movements of his chin   Postop acute blood loss anemia   Received 1 unit of packed RBC with latest hemoglobin of 7.5 from initial 6.9.  No external blood loss noted.    BPH -Continue Flomax , finasteride .   GERD Continue PPI.   Hypothyroidism Continue Synthroid .   Hyperglycemia in the setting of steroids - Currently undercontrol.  Continue sliding scale insulin .  Latest POC glucose of 69  Pressure injuries, not POA, buttocks stage II, deep tissue pressure injury of the left toe, right heel deep tissue injury.  Continue wound care.  Pressure Injury 12/16/23 Buttocks Stage 2 -  Partial thickness loss of dermis presenting as a shallow open injury with a red, pink wound bed without slough. (Active)  12/16/23 1300  Location: Buttocks  Location Orientation:   Staging: Stage 2 -  Partial thickness loss of dermis presenting as a shallow open injury with a red,  pink wound bed without slough.  Wound Description (Comments):   Present on Admission:      Pressure Injury 12/16/23 Toe (Comment  which one) Anterior;Left Deep Tissue Pressure Injury - Purple or maroon localized area of discolored intact skin or blood-filled blister due to damage of underlying soft tissue from pressure and/or shear. (Active)  12/16/23 1300  Location: Toe (Comment  which one)  Location Orientation: Anterior;Left  Staging: Deep Tissue Pressure Injury - Purple or maroon localized area of discolored intact skin or blood-filled blister due to damage of underlying soft tissue from pressure and/or shear.  Wound Description (Comments):   Present on Admission:      Pressure Injury 12/20/23 Heel Right Deep Tissue Pressure Injury - Purple or maroon localized area of discolored intact skin or blood-filled blister due to damage of underlying soft tissue from pressure and/or shear. black (Active)  12/20/23 2000  Location: Heel  Location Orientation: Right  Staging: Deep Tissue Pressure Injury - Purple or maroon localized area of discolored intact skin or blood-filled blister due to damage of underlying soft tissue from pressure and/or shear.  Wound Description (Comments): black  Present on Admission: No   Debility, generalized weakness bedbound status. Seen by physical therapy and recommended skilled nursing facility.   palliative care on board for ongoing goals of care, assist with pain management.  Patient has overall guarded prognosis.   DVT prophylaxis: enoxaparin  (LOVENOX ) injection 40 mg Start: 12/29/23 1000lovenox subcu  Disposition: As per primary team.  Likely to go to skilled nursing facility as per PT evaluation.   Status is: Inpatient   Code Status:     Code Status: Full Code  Family Communication: Spoke with the patient's wife at bedside again today.   Procedures: Redo decompressive laminectomy L3-4 for recurrent stenosis with removal of epidural bone graft  material/posterior fixation L2-S1 inclusive using Alphatec cortical pedicle screw/intertransverse arthrodesis L2-S1 inclusive using morselized autograft and allograft/exploration of fusion L3-4 with removal of instrumentation per Neurosurgery: Dr. Rochelle Chu 12/08/2023 2D echo 12/18/2023 PRBC transfusion 1 unit  Anti-infectives:  Zyvox  and Levaquin  as per ID  Anti-infectives (From admission, onward)    Start     Dose/Rate Route Frequency Ordered Stop   01/19/24 0000  levofloxacin  (LEVAQUIN ) 750 MG tablet        750 mg Oral Daily 12/28/23 0937 02/02/24 2359   01/19/24 0000  linezolid  (ZYVOX ) 600 MG tablet        600 mg Oral 2 times daily 12/28/23 0937 02/02/24 2359   12/27/23 1100  DAPTOmycin  (CUBICIN ) IVPB 700 mg/133mL premix        8 mg/kg  92.1 kg 200 mL/hr over 30 Minutes Intravenous Daily 12/27/23 0824     12/19/23 1300  ceFEPIme  (MAXIPIME ) 2 g in sodium chloride  0.9 % 100 mL IVPB        2 g 200 mL/hr over 30 Minutes Intravenous Every 8 hours 12/19/23 1116     12/17/23 1000  vancomycin  (VANCOREADY) IVPB 1500 mg/300 mL  Status:  Discontinued       Placed in "Followed by" Linked Group   1,500 mg 150 mL/hr over 120 Minutes Intravenous Every 24 hours 12/16/23 0920 12/17/23 1009   12/17/23 1000  cefTRIAXone  (ROCEPHIN ) 2 g in sodium chloride  0.9 % 100 mL IVPB  Status:  Discontinued        2 g 200 mL/hr over 30 Minutes Intravenous Every 24 hours 12/17/23 0159 12/19/23 1116   12/16/23 1030  vancomycin  (VANCOREADY) IVPB 2000 mg/400 mL       Placed in "Followed by" Linked Group   2,000 mg 200 mL/hr over 120 Minutes Intravenous  Once 12/16/23 0920 12/16/23 1359   12/16/23 1015  ceFEPIme  (MAXIPIME ) 2 g in sodium chloride  0.9 % 100 mL IVPB  Status:  Discontinued        2 g 200 mL/hr over 30 Minutes Intravenous Every 8 hours 12/16/23 0920 12/17/23 0158   12/08/23 1900  ceFAZolin  (ANCEF ) IVPB 2g/100 mL premix        2 g 200 mL/hr over 30 Minutes Intravenous Every 8 hours 12/08/23 1721 12/09/23  0452   12/08/23 1015  ceFAZolin  (ANCEF ) IVPB 2g/100 mL premix        2 g 200 mL/hr over 30 Minutes Intravenous On call to O.R. 12/08/23 1000 12/08/23 1105   12/08/23 0952  ceFAZolin  (ANCEF ) 2-4 GM/100ML-% IVPB       Note to Pharmacy: Noelle Batten M: cabinet override      12/08/23 0952 12/08/23 1105      Subjective: Today, patient was seen and examined at bedside.  Had some concerns for aspiration so MBS was done and is currently on regular diet.  On supplemental oxygen.  Slow to respond.  Patient denies much symptoms.  Spouse at bedside.    Objective: Vitals:   01/04/24 0945 01/04/24 1000  BP:  (!) 148/89  Pulse: 77 (!) 130  Resp:    Temp:    SpO2: 96%  96%    Intake/Output Summary (Last 24 hours) at 01/04/2024 1341 Last data filed at 01/04/2024 0532 Gross per 24 hour  Intake 2744.5 ml  Output 500 ml  Net 2244.5 ml   Filed Weights   12/20/23 2106 12/25/23 0500 12/25/23 1316  Weight: 92.6 kg 92.1 kg 92.1 kg   Body mass index is 27.54 kg/m.   Physical Exam:  General:  Average built, not in obvious distress, appears weak and deconditioned.  Communicative.  Tremor of the chin. HENT:   Pallor noted.  Oral mucosa is moist.  Chest: Diminished, breath sounds.  No crackles or wheezes. CVS: S1 &S2 heard. No murmur.  Regular rate and rhythm. Abdomen: Soft, nontender, nondistended.  Bowel sounds are heard.  External urinary catheter in place Extremities: No cyanosis, clubbing or edema.  Peripheral pulses are palpable.  Right upper extremity PICC line in place Psych: Alert, awake and Communicative. CNS:  No cranial nerve deficits.  moving extremities but with generalized weakness, tremor of the Chin. Skin: Warm and dry.  Pressure injuries noted  Data Review: I have personally reviewed the following laboratory data and studies,  CBC: Recent Labs  Lab 12/30/23 0405 12/30/23 1644 12/31/23 0604 01/02/24 0601 01/03/24 0241 01/04/24 0157  WBC 9.1  --  8.5 8.9 8.4 9.5  HGB  6.9* 8.4* 7.9* 7.7* 8.0* 7.5*  HCT 21.8* 25.7* 23.8* 24.6* 25.6* 24.4*  MCV 96.0  --  93.7 94.6 97.7 96.4  PLT 201  --  212 212 225 185   Basic Metabolic Panel: Recent Labs  Lab 12/31/23 0604 01/03/24 0241 01/04/24 0157  NA 139 138 140  K 3.7 3.9 3.5  CL 105 105 107  CO2 24 20* 22  GLUCOSE 79 72 96  BUN 15 18 18   CREATININE 0.67 0.84 0.75  CALCIUM  8.4* 8.3* 8.2*  MG  --   --  1.9   Liver Function Tests: No results for input(s): "AST", "ALT", "ALKPHOS", "BILITOT", "PROT", "ALBUMIN " in the last 168 hours. No results for input(s): "LIPASE", "AMYLASE" in the last 168 hours. No results for input(s): "AMMONIA" in the last 168 hours. Cardiac Enzymes: Recent Labs  Lab 01/04/24 0157  CKTOTAL 108   BNP (last 3 results) Recent Labs    12/16/23 1049  BNP 274.3*    ProBNP (last 3 results) No results for input(s): "PROBNP" in the last 8760 hours.  CBG: Recent Labs  Lab 01/03/24 2144 01/03/24 2319 01/04/24 0356 01/04/24 0613 01/04/24 1140  GLUCAP 119* 109* 84 86 105*   No results found for this or any previous visit (from the past 240 hours).   Studies: DG CHEST PORT 1 VIEW Result Date: 01/03/2024 CLINICAL DATA:  [id number] Dyspnea [id number] EXAM: PORTABLE CHEST - 1 VIEW COMPARISON:  Dec 24, 2023 FINDINGS: Right PICC terminates in the lower SVC. No focal airspace consolidation, pleural effusion, or pneumothorax. Mild cardiomegaly. Aortic atherosclerosis. Right shoulder arthroplasty. Multilevel thoracic osteophytosis. IMPRESSION: No acute cardiopulmonary abnormality. Electronically Signed   By: [name] M.D.   On: 01/03/2024 16:58   DG Swallowing Func-Speech Pathology Result Date: 01/03/2024 Table formatting from the original result was not included. Modified Barium Swallow Study Patient Details Name: NICOLA QUESNELL MRN: [id number] Date of Birth: 1950-12-08 Today's Date: 01/03/2024 HPI/PMH: HPI: LAMARION MCEVERS is a 73 yo male presenting to ED 4/22 with severe lower back pain.  Underwent laminectomy L3-4 with posterior fixation L2-S1. Recovery complicated by pain and hypotension and was transferred to ICU 5/3. PMH includes gout, arthritis,  HTN, HLD, PVD, RBBB, R reverse TSA, sleep apnea Clinical Impression: Clinical Impression: Pt presents with moderate oropharyngeal dysphagia characterized by mistiming. Swallow initiation is consistently triggered after the bolus collects in the pyriform sinuses. This results in spillage of the tail of the bolus as the swallow is initiated which enters the laryngeal vestibule. Thin and nectar thick liquids resulted in trace penetration that was not cleared independently (PAS 3). When the 13 mm barium tablet was introduced, pt had increased difficulty coordinating swallow initiation, resulting in silent aspiration of thin liquids (PAS 8). A cued cough effectively cleared the majority of aspirated material. This was an isolated event and did not recur in subsequent trials when pt was challenged with sequential straw sips. There is frequently a collection of BOT and vallecular residue that increases as the consistency becomes thicker. Suspect there is potential for his performance to fluctuate given mentation and stamina throughout the course of a meal. Provided education to pt and his wife regarding results and using compensatory strategies including intermittent cough, rate control, and avoiding pills with liquids. Recommend resuming regular diet with thin liquids. Pills should be given whole or crushed with puree. SLP will continue following at least briefly for ongoing assessment. Factors that may increase risk of adverse event in presence of aspiration [name] & [name] 2021): Factors that may increase risk of adverse event in presence of aspiration [name] & [name] 2021): Poor general health and/or compromised immunity; Reduced cognitive function; Limited mobility; Frail or deconditioned; Dependence for feeding and/or oral hygiene  Recommendations/Plan: Swallowing Evaluation Recommendations Swallowing Evaluation Recommendations Recommendations: PO diet PO Diet Recommendation: Regular; Thin liquids (Level 0) Liquid Administration via: Cup; Straw Medication Administration: Whole meds with puree Supervision: Full assist for feeding; Full supervision/cueing for swallowing strategies Swallowing strategies  : Minimize environmental distractions; Slow rate; Small bites/sips; Hard cough after swallowing Postural changes: Position pt fully upright for meals Oral care recommendations: Oral care BID (2x/day) Treatment Plan Treatment Plan Treatment recommendations: Therapy as outlined in treatment plan below Follow-up recommendations: Skilled nursing-short term rehab (<3 hours/day) Functional status assessment: Patient has had a recent decline in their functional status and demonstrates the ability to make significant improvements in function in a reasonable and predictable amount of time. Treatment frequency: Min 2x/week Treatment duration: 2 weeks Interventions: Aspiration precaution training; Compensatory techniques; Patient/family education; Trials of upgraded texture/liquids; Diet toleration management by SLP Recommendations Recommendations for follow up therapy are one component of a multi-disciplinary discharge planning process, led by the attending physician.  Recommendations may be updated based on patient status, additional functional criteria and insurance authorization. Assessment: Orofacial Exam: Orofacial Exam Oral Cavity: Oral Hygiene: WFL Oral Cavity - Dentition: Adequate natural dentition Orofacial Anatomy: WFL Oral Motor/Sensory Function: WFL Anatomy: Anatomy: Suspected cervical osteophytes; Prominent cricopharyngeus Boluses Administered: Boluses Administered Boluses Administered: Thin liquids (Level 0); Mildly thick liquids (Level 2, nectar thick); Moderately thick liquids (Level 3, honey thick); Puree; Solid  Oral Impairment Domain:  Oral Impairment Domain Lip Closure: Escape progressing to mid-chin Tongue control during bolus hold: Escape to lateral buccal cavity/floor of mouth Bolus preparation/mastication: Timely and efficient chewing and mashing Bolus transport/lingual motion: Brisk tongue motion Oral residue: Trace residue lining oral structures Location of oral residue : Tongue; Palate Initiation of pharyngeal swallow : Pyriform sinuses  Pharyngeal Impairment Domain: Pharyngeal Impairment Domain Soft palate elevation: No bolus between soft palate (SP)/pharyngeal wall (PW) Laryngeal elevation: Partial superior movement of thyroid  cartilage/partial approximation of arytenoids to epiglottic petiole Anterior hyoid excursion: Partial anterior movement  Epiglottic movement: Complete inversion Laryngeal vestibule closure: Complete, no air/contrast in laryngeal vestibule Pharyngeal stripping wave : Present - complete Pharyngeal contraction (A/P view only): N/A Pharyngoesophageal segment opening: Partial distention/partial duration, partial obstruction of flow Tongue base retraction: Trace column of contrast or air between tongue base and PPW Pharyngeal residue: Collection of residue within or on pharyngeal structures Location of pharyngeal residue: Tongue base; Valleculae; Pyriform sinuses  Esophageal Impairment Domain: Esophageal Impairment Domain Esophageal clearance upright position: Complete clearance, esophageal coating Pill: Pill Consistency administered: Thin liquids (Level 0) Thin liquids (Level 0): Impaired (see clinical impressions) Penetration/Aspiration Scale Score: Penetration/Aspiration Scale Score 1.  Material does not enter airway: Moderately thick liquids (Level 3, honey thick); Puree; Solid 3.  Material enters airway, remains ABOVE vocal cords and not ejected out: Mildly thick liquids (Level 2, nectar thick) 8.  Material enters airway, passes BELOW cords without attempt by patient to eject out (silent aspiration) : Thin liquids  (Level 0) (when taking the pill) Compensatory Strategies: Compensatory Strategies Compensatory strategies: No   General Information: Caregiver present: Yes  Diet Prior to this Study: NPO   Temperature : Normal   Respiratory Status: WFL   Supplemental O2: None (Room air)   History of Recent Intubation: No  Behavior/Cognition: Alert; Cooperative Self-Feeding Abilities: Needs assist with self-feeding Baseline vocal quality/speech: Normal Volitional Cough: Able to elicit Volitional Swallow: Able to elicit Exam Limitations: No limitations Goal Planning: Prognosis for improved oropharyngeal function: Good Barriers to Reach Goals: Cognitive deficits No data recorded Patient/Family Stated Goal: none stated Consulted and agree with results and recommendations: Patient; Family member/caregiver Pain: Pain Assessment Pain Assessment: Faces Faces Pain Scale: 4 Breathing: 0 Negative Vocalization: 0 Facial Expression: 0 Body Language: 0 Consolability: 0 PAINAD Score: 0 Pain Location: back when repositioning to chair Pain Descriptors / Indicators: Aching; Guarding Pain Intervention(s): Monitored during session End of Session: Start Time:SLP Start Time (ACUTE ONLY): 1023 Stop Time: SLP Stop Time (ACUTE ONLY): 1036 Time Calculation:SLP Time Calculation (min) (ACUTE ONLY): 13 min Charges: SLP Evaluations $ SLP Speech Visit: 1 Visit SLP Evaluations $BSS Swallow: 1 Procedure $MBS Swallow: 1 Procedure SLP visit diagnosis: SLP Visit Diagnosis: Dysphagia, oropharyngeal phase (R13.12) Past Medical History: Past Medical History: Diagnosis Date  Abnormal EKG   Arthritis   Essential hypertension 01/02/2017  GERD (gastroesophageal reflux disease)   Gout   Hyperlipidemia 01/02/2017  Hypertension   Hypothyroidism   Peripheral vascular disease (HCC)   RBBB 01/02/2017  Rotator cuff tear   Sleep apnea  Past Surgical History: Past Surgical History: Procedure Laterality Date  APPENDECTOMY    CATARACT EXTRACTION Bilateral   EYE SURGERY    8 years ago   FEMORAL ENDARTERECTOMY Bilateral   done 15 yrs. ago at [facility]  JOINT REPLACEMENT    LAMINECTOMY WITH POSTERIOR LATERAL ARTHRODESIS LEVEL 4 N/A 12/08/2023  Procedure: Lumbar Two - Sacral Two Revision - Extension of Fusion with O-Arm;  Surgeon: Joaquin Mulberry, MD;  Location: [facility] OR;  Service: Neurosurgery;  Laterality: N/A;  L2 - S2 Revision - Extension of Fusion with O-Arm  LUMBAR LAMINECTOMY/DECOMPRESSION MICRODISCECTOMY Left 04/23/2020  Procedure: MICRO LUMBER DECOMPRESSION LUMBAR FOUR-SACRAL ONE ON LEFT AND FORAMINOTOMY LUMBAR FIVE LEFT;  Surgeon: Orvan Blanch, MD;  Location: [facility] OR;  Service: Orthopedics;  Laterality: Left;  posterior  LUMBAR WOUND DEBRIDEMENT N/A 12/25/2023  Procedure: LUMBAR WOUND IRRIGATION AND DEBRIDEMENT;  Surgeon: Joaquin Mulberry, MD;  Location: [facility] OR;  Service: Neurosurgery;  Laterality: N/A;  REVERSE SHOULDER ARTHROPLASTY Right 12/16/2016  Procedure:  RIGHT REVERSE TOTAL SHOULDER ARTHROPLASTY;  Surgeon: Winston Hawking, MD;  Location: [facility] OR;  Service: Orthopedics;  Laterality: Right;  SEPTOPLASTY    TONSILLECTOMY   Amil Kale, M.A., CCC-SLP Speech Language Pathology, Acute Rehabilitation Services Secure Chat preferred [phone] 01/03/2024, 1:00 PM    Rosena Conradi, MD  [facility] 01/04/2024  If 7PM-7AM, please contact night-coverage

## 2024-01-04 NOTE — Plan of Care (Signed)
 Palliative:  I came to visit with Norman Lambert and Norman Lambert. Both were asleep. I did not awaken. I will follow up Monday 5/26 - please call for acute palliative needs in the meantime.   No charge  Vila Grayer, NP Palliative Medicine Team Pager [phone] (Please see [email or web address] for schedule) Team Phone [phone]

## 2024-01-05 DIAGNOSIS — M5440 Lumbago with sciatica, unspecified side: Secondary | ICD-10-CM | POA: Diagnosis not present

## 2024-01-05 LAB — CBC
HCT: 23.3 % — ABNORMAL LOW (ref 39.0–52.0)
Hemoglobin: 7.2 g/dL — ABNORMAL LOW (ref 13.0–17.0)
MCH: 29.6 pg (ref 26.0–34.0)
MCHC: 30.9 g/dL (ref 30.0–36.0)
MCV: 95.9 fL (ref 80.0–100.0)
Platelets: 161 10*3/uL (ref 150–400)
RBC: 2.43 MIL/uL — ABNORMAL LOW (ref 4.22–5.81)
RDW: 13.5 % (ref 11.5–15.5)
WBC: 8.3 10*3/uL (ref 4.0–10.5)
nRBC: 0 % (ref 0.0–0.2)

## 2024-01-05 LAB — BASIC METABOLIC PANEL WITH GFR
Anion gap: 12 (ref 5–15)
BUN: 19 mg/dL (ref 8–23)
CO2: 23 mmol/L (ref 22–32)
Calcium: 8 mg/dL — ABNORMAL LOW (ref 8.9–10.3)
Chloride: 105 mmol/L (ref 98–111)
Creatinine, Ser: 0.61 mg/dL (ref 0.61–1.24)
GFR, Estimated: >60 mL/min (ref >60)
Glucose, Bld: 108 mg/dL — ABNORMAL HIGH (ref 70–99)
Potassium: 3.6 mmol/L (ref 3.5–5.1)
Sodium: 140 mmol/L (ref 135–145)

## 2024-01-05 LAB — GLUCOSE, CAPILLARY
Glucose-Capillary: 95 mg/dL (ref 70–99)
Glucose-Capillary: 116 mg/dL — ABNORMAL HIGH (ref 70–99)
Glucose-Capillary: 109 mg/dL — ABNORMAL HIGH (ref 70–99)
Glucose-Capillary: 88 mg/dL (ref 70–99)

## 2024-01-05 LAB — MAGNESIUM: Magnesium: 1.8 mg/dL (ref 1.7–2.4)

## 2024-01-05 MED ORDER — MIDODRINE HCL 2.5 MG PO TABS
150.0000 mg | ORAL_TABLET | Freq: Three times a day (TID) | ORAL | Status: DC
  Filled 2024-01-05 (×2): qty 60

## 2024-01-05 MED ORDER — MIDODRINE HCL 5 MG PO TABS
15.0000 mg | ORAL_TABLET | Freq: Three times a day (TID) | ORAL | Status: DC
  Administered 2024-01-05 – 2024-01-07 (×7): 15 mg via ORAL
  Filled 2024-01-05 (×7): qty 3

## 2024-01-05 MED ORDER — LACTATED RINGERS IV BOLUS
1000.0000 mL | INTRAVENOUS | Status: AC
  Administered 2024-01-05: 1000 mL via INTRAVENOUS

## 2024-01-05 MED ORDER — ALBUMIN HUMAN 25 % IV SOLN
25.0000 g | INTRAVENOUS | Status: AC
  Administered 2024-01-05: 25 g via INTRAVENOUS
  Filled 2024-01-05: qty 100

## 2024-01-05 MED ORDER — OXYCODONE HCL ER 10 MG PO T12A
10.0000 mg | EXTENDED_RELEASE_TABLET | Freq: Two times a day (BID) | ORAL | Status: DC
  Administered 2024-01-05 – 2024-01-10 (×11): 10 mg via ORAL
  Filled 2024-01-05 (×12): qty 1

## 2024-01-05 MED ORDER — HYDROMORPHONE HCL 1 MG/ML IJ SOLN: 0.5000 mg | INTRAMUSCULAR | Status: DC | PRN

## 2024-01-05 MED ORDER — MIDODRINE HCL 5 MG PO TABS: 15.0000 mg | ORAL_TABLET | Freq: Three times a day (TID) | ORAL | Status: DC

## 2024-01-05 NOTE — TOC Progression Note (Addendum)
 Transition of Care [facility]) - Progression Note    Patient Details  Name: Norman Lambert MRN: [id number] Date of Birth: 07/10/1951  Transition of Care [facility]) CM/SW Contact  Reigan Tolliver A [place], LCSW Phone Number: 01/05/2024, 4:46 PM  Clinical Narrative:     CSW met with pt and [name] at bedside to discuss disposition plan for SNF. She stated that she had a preference for [facility] previously, as pt has bed offer there, which CSW provided, but was currently interested in facilities in [facility] area. Provided preference for [facility]. CSW to sent out referral to facility and follow up regarding status when able.    TOC will continue to follow.   Expected Discharge Plan: Skilled Nursing Facility Barriers to Discharge: Continued Medical Work up  Expected Discharge Plan and Services In-house Referral: Clinical Social Work   Post Acute Care Choice: Home Health Living arrangements for the past 2 months: Single Family Home                                       Social Determinants of Health (SDOH) Interventions SDOH Screenings   Food Insecurity: No Food Insecurity (12/06/2023)  Housing: Low Risk  (12/06/2023)  Transportation Needs: No Transportation Needs (12/06/2023)  Utilities: Not At Risk (12/06/2023)  Financial Resource Strain: Low Risk  (06/05/2023)   Received from [facility]  Physical Activity: Unknown (06/05/2023)   Received from [facility]  Social Connections: Moderately Isolated (12/11/2023)  Stress: No Stress Concern Present (08/21/2023)   Received from [facility]  Tobacco Use: Medium Risk (12/25/2023)    Readmission Risk Interventions     No data to display

## 2024-01-05 NOTE — Progress Notes (Signed)
 PROGRESS NOTE  Norman Lambert JXB:[id number] DOB: Jun 27, 1951 DOA: 12/05/2023 PCP: Norman Lambert, No Pcp Per   LOS: 31 days   Brief narrative:  73 year old male with past medical history of hypertension, gout, hyperlipidemia, hypothyroidism, sleep apnea was admitted on 4/25 for reduction and internal fixation of previous fusions involving L2-S1. Postoperative course initially seemed unremarkable but subsequently he continued to have increased pain involving the left foot seemingly not responding to as needed narcotics. Imaging of the back was unremarkable, he continued to have drain VAC in place.  Subsequently on 12/15/2023 Norman Lambert had hypotension with tachycardia and fever up to 103 F.  Norman Lambert was then started on IV cefepime  and vancomycin  with concern about sepsis.  There was significant wound erythema with serosanguineous foul-smelling drainage from the surgical site.  Norman Lambert had Levophed  briefly but subsequently was weaned off    Significant hospital events 4/25 Extensive L2-S1 lumbar surgery 4/30 New onset pain  5/1 Ongoing pain with borderline blood pressure 5/2 Pain, leukocytosis fever, 5/3 Hypotension, not responding to crystalloid started on broad-spectrum antibiotics wound assessed by surgical team culture sent imaging pending ICU transfer. CT imaging of lumbar spine was negative for definitive abscess there were stable fractures at L3 and L4 with possible new fracture involving the transverse process of L2 and L3 on the left postoperative changes noted.CT angio of chest was negative for pulmonary emboli had bibasilar airspace disease the right was a little worse than the left consistent with possible pneumonia there was aortic aneurysmal dilation of the ascending aorta measuring at 4.5 cm 5/4 Weaned off norepinephrine , Blood cultures from 1 out of 2 samples growing gram-negative rods with BC ID showing both proteus and Enterobacterales, MRSA PCR pending. Narrowed to ceftriaxone . 5/5 Remains on  low dose NE. Febrile. BCx growing proteus.  5/6 Wound Cx with proteus, klebsiella aerogenes. Abx re-broadened to cefepime . 5/8 Required low dose norepi overnight but off this AM. His wound vac was changed this AM and has been having some pain 5/9 no issues overnight, denies any acute pain this a.m. 5/14-continues to have back pain. 5/17-has been advised bedrest for back pain.   5/19-removal of wound VAC.  PT evaluation. 5/20-concerns for aspiration show speech therapy was consulted,. for modified barium swallow. 5/21-speech therapy recommend regular diet.     Assessment/Plan: Principal Problem:   Back pain Active Problems:   S/P lumbar fusion   Septic shock (HCC)   Bacteremia due to Proteus species   Pressure injury of skin   Paroxysmal A-fib (HCC)   Acute postoperative anemia due to expected blood loss   Benign prostatic hyperplasia   Gastroesophageal reflux disease   Acute metabolic encephalopathy   Polymicrobial bacterial infection  Septic shock secondary to Proteus bacteremia likely from postsurgical infection/possible pneumonia. Surgical wound cultures grew Klebsiella aerogenes and Proteus Mirabilis. Blood cultures from 12/16/2023 with Proteus Mirabilis.  Was on cefepime  and daptomycin  been transitioned to linezolid  and Levaquin ..   Temperature max of 98.1 F..  No leukocytosis.  Chest x-ray repeated without any infiltrate.  Continue steroid taper.  ID has followed the Norman Lambert during hospitalization.    VAC management as per neurosurgery.  Currently on 2 L of oxygen by nasal cannula.  More alert awake and communicative today.  Continue aspiration precautions.  Hypoglycemia. Improved at this time.  Hold insulin  if not eating.  Has been started on regular diet.  Concerns for aspiration.    Speech therapy was consulted and Norman Lambert has been put back on regular diet.  Status post  modified barium swallow.  Continue aspiration precaution.  Continue nasal oxygen.  Status post redo of  laminectomy L3-4 with posterior fixation L2-S1 4/25 Continue pain management.  Repeat CT scan of the lumbar spine done 12/26/2023 shows fracture of the L5 pedicle.  Likely fragility fractures from osteoporosis from long-term steroids.  Currently on steroid taper.  Will try to minimize narcotics to avoid sedation.  Wound VAC has been removed.  PT OT consulted.  Plan for skilled nursing facility on discharge.     Paroxysmal A-fib with RVR CHA2DS2VASC score 3. In normal sinus rhythm at this time.  Asymptomatic.  Review of 2D echocardiogram with EF of 55 to 60%.  Currently on Lovenox .   Hyperlipidemia - Continue statin.   Essential hypertension On midodrine  due to low blood pressure.  Latest blood pressure of 25/48 continue to hold antihypertensives and continue midodrine  for now..   Peripheral vascular disease Continue Crestor .  Acute metabolic encephalopathy  Improved.  Communicative.  Continue Delirium precautions.  Continues to  have intermittent involuntary movements of his chin   Postop acute blood loss anemia   Received 1 unit of packed RBC with latest hemoglobin of 7.2 from initial 6.9.  No external blood loss noted.    BPH -Continue Flomax , finasteride .   GERD Continue PPI.   Hypothyroidism Continue Synthroid .   Hyperglycemia in the setting of steroids - Currently undercontrol.  Continue sliding scale insulin .  Latest POC glucose of 69  Pressure injuries, not POA, buttocks stage II, deep tissue pressure injury of the left toe, right heel deep tissue injury.  Continue wound care.  Pressure Injury 12/16/23 Buttocks Stage 2 -  Partial thickness loss of dermis presenting as a shallow open injury with a red, pink wound bed without slough. (Active)  12/16/23 1300  Location: Buttocks  Location Orientation:   Staging: Stage 2 -  Partial thickness loss of dermis presenting as a shallow open injury with a red, pink wound bed without slough.  Wound Description (Comments):   Present  on Admission:      Pressure Injury 12/16/23 Toe (Comment  which one) Anterior;Left Deep Tissue Pressure Injury - Purple or maroon localized area of discolored intact skin or blood-filled blister due to damage of underlying soft tissue from pressure and/or shear. (Active)  12/16/23 1300  Location: Toe (Comment  which one)  Location Orientation: Anterior;Left  Staging: Deep Tissue Pressure Injury - Purple or maroon localized area of discolored intact skin or blood-filled blister due to damage of underlying soft tissue from pressure and/or shear.  Wound Description (Comments):   Present on Admission:      Pressure Injury 12/20/23 Heel Right Deep Tissue Pressure Injury - Purple or maroon localized area of discolored intact skin or blood-filled blister due to damage of underlying soft tissue from pressure and/or shear. black (Active)  12/20/23 2000  Location: Heel  Location Orientation: Right  Staging: Deep Tissue Pressure Injury - Purple or maroon localized area of discolored intact skin or blood-filled blister due to damage of underlying soft tissue from pressure and/or shear.  Wound Description (Comments): black  Present on Admission: No   Debility, generalized weakness bedbound status. Seen by physical therapy and recommended skilled nursing facility.   Palliative care on board for ongoing goals of care, assistance with pain management.  Norman Lambert has overall guarded prognosis.   DVT prophylaxis: enoxaparin  (LOVENOX ) injection 40 mg Start: 12/29/23 1000lovenox subcu   Disposition:  As per primary team.  Likely to go to skilled nursing facility  as per PT evaluation.  Medically stable for disposition.  Status is: Inpatient   Code Status:     Code Status: Full Code  Family Communication: Spoke with the Norman Lambert's wife at bedside again today.  Procedures: Redo decompressive laminectomy L3-4 for recurrent stenosis with removal of epidural bone graft material/posterior fixation L2-S1  inclusive using Alphatec cortical pedicle screw/intertransverse arthrodesis L2-S1 inclusive using morselized autograft and allograft/exploration of fusion L3-4 with removal of instrumentation per Neurosurgery: Dr. Rochelle Chu 12/08/2023 2D echo 12/18/2023 PRBC transfusion 1 unit  Anti-infectives:  Zyvox  and Levaquin  as per ID  Anti-infectives (From admission, onward)    Start     Dose/Rate Route Frequency Ordered Stop   01/19/24 0000  levofloxacin  (LEVAQUIN ) 750 MG tablet        750 mg Oral Daily 12/28/23 0937 02/02/24 2359   01/19/24 0000  linezolid  (ZYVOX ) 600 MG tablet        600 mg Oral 2 times daily 12/28/23 0937 02/02/24 2359   12/27/23 1100  DAPTOmycin  (CUBICIN ) IVPB 700 mg/140mL premix        8 mg/kg  92.1 kg 200 mL/hr over 30 Minutes Intravenous Daily 12/27/23 0824     12/19/23 1300  ceFEPIme  (MAXIPIME ) 2 g in sodium chloride  0.9 % 100 mL IVPB        2 g 200 mL/hr over 30 Minutes Intravenous Every 8 hours 12/19/23 1116     12/17/23 1000  vancomycin  (VANCOREADY) IVPB 1500 mg/300 mL  Status:  Discontinued       Placed in "Followed by" Linked Group   1,500 mg 150 mL/hr over 120 Minutes Intravenous Every 24 hours 12/16/23 0920 12/17/23 1009   12/17/23 1000  cefTRIAXone  (ROCEPHIN ) 2 g in sodium chloride  0.9 % 100 mL IVPB  Status:  Discontinued        2 g 200 mL/hr over 30 Minutes Intravenous Every 24 hours 12/17/23 0159 12/19/23 1116   12/16/23 1030  vancomycin  (VANCOREADY) IVPB 2000 mg/400 mL       Placed in "Followed by" Linked Group   2,000 mg 200 mL/hr over 120 Minutes Intravenous  Once 12/16/23 0920 12/16/23 1359   12/16/23 1015  ceFEPIme  (MAXIPIME ) 2 g in sodium chloride  0.9 % 100 mL IVPB  Status:  Discontinued        2 g 200 mL/hr over 30 Minutes Intravenous Every 8 hours 12/16/23 0920 12/17/23 0158   12/08/23 1900  ceFAZolin  (ANCEF ) IVPB 2g/100 mL premix        2 g 200 mL/hr over 30 Minutes Intravenous Every 8 hours 12/08/23 1721 12/09/23 0452   12/08/23 1015  ceFAZolin   (ANCEF ) IVPB 2g/100 mL premix        2 g 200 mL/hr over 30 Minutes Intravenous On call to O.R. 12/08/23 1000 12/08/23 1105   12/08/23 0952  ceFAZolin  (ANCEF ) 2-4 GM/100ML-% IVPB       Note to Pharmacy: Noelle Batten M: cabinet override      12/08/23 0952 12/08/23 1105      Subjective: Today, Norman Lambert was seen and examined at bedside.  Norman Lambert's wife at bedside.  Appears to be more alert awake Communicative.  Denies any pain dyspnea shortness of breath chest pain.    Objective: Vitals:   01/05/24 0419 01/05/24 0853  BP: (!) 105/49 (!) 95/48  Pulse: 75 73  Resp: 16   Temp: 97.8 F (36.6 C)   SpO2: 98% 96%    Intake/Output Summary (Last 24 hours) at 01/05/2024 1344 Last data filed at 01/05/2024  0400 Gross per 24 hour  Intake 118 ml  Output 730 ml  Net -612 ml   Filed Weights   12/20/23 2106 12/25/23 0500 12/25/23 1316  Weight: 92.6 kg 92.1 kg 92.1 kg   Body mass index is 27.54 kg/m.   Physical Exam:  General:  Average built, not in obvious distress, appears weak and deconditioned, tremulous skin HENT:   Pallor noted.  Oral mucosa is moist.  Chest:   Diminished breath sounds bilaterally.  CVS: S1 &S2 heard. No murmur.  Regular rate and rhythm. Abdomen: Soft, nontender, nondistended.  Bowel sounds are heard.  External urinary catheter. Extremities: No cyanosis, clubbing or edema.  Peripheral pulses are palpable.  Right upper extremity PICC Psych: Alert, awake and  Communicative.   CNS:  No cranial nerve deficits.  Generalized weakness, tremors present. Skin: Warm and dry.  Pressure injuries.  Data Review: I have personally reviewed the following laboratory data and studies,  CBC: Recent Labs  Lab 12/31/23 0604 01/02/24 0601 01/03/24 0241 01/04/24 0157 01/05/24 0205  WBC 8.5 8.9 8.4 9.5 8.3  HGB 7.9* 7.7* 8.0* 7.5* 7.2*  HCT 23.8* 24.6* 25.6* 24.4* 23.3*  MCV 93.7 94.6 97.7 96.4 95.9  PLT 212 212 225 185 161   Basic Metabolic Panel: Recent Labs  Lab  12/31/23 0604 01/03/24 0241 01/04/24 0157 01/05/24 0205  NA 139 138 140 140  K 3.7 3.9 3.5 3.6  CL 105 105 107 105  CO2 24 20* 22 23  GLUCOSE 79 72 96 108*  BUN 15 18 18 19   CREATININE 0.67 0.84 0.75 0.61  CALCIUM  8.4* 8.3* 8.2* 8.0*  MG  --   --  1.9 1.8   Liver Function Tests: No results for input(s): "AST", "ALT", "ALKPHOS", "BILITOT", "PROT", "ALBUMIN " in the last 168 hours. No results for input(s): "LIPASE", "AMYLASE" in the last 168 hours. No results for input(s): "AMMONIA" in the last 168 hours. Cardiac Enzymes: Recent Labs  Lab 01/04/24 0157  CKTOTAL 108   BNP (last 3 results) Recent Labs    12/16/23 1049  BNP 274.3*    ProBNP (last 3 results) No results for input(s): "PROBNP" in the last 8760 hours.  CBG: Recent Labs  Lab 01/04/24 1140 01/04/24 1851 01/04/24 2330 01/05/24 0506 01/05/24 1246  GLUCAP 105* 108* 128* 95 116*   No results found for this or any previous visit (from the past 240 hours).   Studies: DG CHEST PORT 1 VIEW Result Date: 01/03/2024 CLINICAL DATA:  [id number] Dyspnea [id number] EXAM: PORTABLE CHEST - 1 VIEW COMPARISON:  Dec 24, 2023 FINDINGS: Right PICC terminates in the lower SVC. No focal airspace consolidation, pleural effusion, or pneumothorax. Mild cardiomegaly. Aortic atherosclerosis. Right shoulder arthroplasty. Multilevel thoracic osteophytosis. IMPRESSION: No acute cardiopulmonary abnormality. Electronically Signed   By: [name] M.D.   On: 01/03/2024 16:58     Rosena Conradi, MD  [facility] 01/05/2024  If 7PM-7AM, please contact night-coverage

## 2024-01-05 NOTE — Progress Notes (Signed)
 OT Cancellation Note  Patient Details Name: Norman Lambert MRN: [id number] DOB: 1950/10/16   Cancelled Treatment:    Reason Eval/Treat Not Completed: (P) Fatigue/lethargy limiting ability to participate, attempted multiple times to wake up Pt, able to open eyes briefly a couple of times, not able to respond, will return tomorrow or as able.  Scherry Curtis 01/05/2024, 2:52 PM

## 2024-01-05 NOTE — Progress Notes (Signed)
 Physical Therapy Treatment Patient Details Name: Norman Lambert MRN: [id number] DOB: Mar 14, 1951 Today's Date: 01/05/2024   History of Present Illness 73 yo male admitted 12/05/23 with LB and severe back pain. 12/08/23 redo of laminectomy L3-4 with posterior fixation L2-S1; recovery complicated by pain and hypotension; 5/2 hypotension and tachycardia. 5/3 transfers to ICU, pressors, L2-3 TVP fx. 5/5 VAC placed. PT consulted for re-evaluation on 01/01/24 after transfer from step-down to med-surg unit. PMHx: gout, arthritis, HTN, HLD, HTN, PVD, RBBB, Rt reverse TSA, sleep apnea, 11/13/23 PLIF L3-4.    PT Comments  Pt received in supine, lethargic (slightly opens eyes to sternal rub and then to loud vocalization/commands but does not maintain eyes open). Pt had slid down in air bed so +2 totalA to scoot in supine toward HOB then air bed placed in more upright chair posture and spouse arriving toward end of session and reports he has been more lethargic all day today. Pt with secretion laden breath sounds, RN notified pt would benefit from positioning/interventions to assist with pulmonary clearance or IS if pt becomes alert enough to follow commands for IS, but currently PTA only able to assist him for pressure relief/comfort and more upright posture. Appreciate palliative care input as well. Plan to work more on EOB/standing trials next session vs mechanical lift OOB to chair if pt more alert and able to follow commands next session. Patient will benefit from continued inpatient follow up therapy, <3 hours/day.   If plan is discharge home, recommend the following: Two people to help with walking and/or transfers;Two people to help with bathing/dressing/bathroom;Supervision due to cognitive status;Assistance with cooking/housework   Can travel by private vehicle     No  Equipment Recommendations  Wheelchair (measurements PT);Hoyer lift;Hospital bed;Wheelchair cushion (measurements PT)    Recommendations for  Other Services       Precautions / Restrictions Precautions Precautions: Back;Fall;Other (comment) Precaution Booklet Issued: Yes (comment) Recall of Precautions/Restrictions: Impaired Precaution/Restrictions Comments: does not state 3/3, but compliant with mobility; handout brought to room after session Required Braces or Orthoses: Spinal Brace Spinal Brace: Lumbar corset;Applied in sitting position Other Brace: AFO for LLE Restrictions Weight Bearing Restrictions Per Provider Order: No     Mobility  Bed Mobility Overal bed mobility: Needs Assistance             General bed mobility comments: totalA +2 for posterior supine scooting toward HOB as pt had slid down in air bed; bed then positioned in more upright chair posture to try to improved pumonary clearance as breathing sounds secretion laden.    Transfers Overall transfer level: Needs assistance                 General transfer comment: pt needing +2 totalA for attempts at long sitting from elevated HOB posture to place pillow more supportively behind his shoulders/head, pt grasping L side rail with hand over hand assist but pt not able to effectively assist with trunk raising 2/2 lethargy.    Ambulation/Gait               General Gait Details: unable to attempt this date; lethargic   Stairs             Wheelchair Mobility     Tilt Bed    Modified Rankin (Stroke Patients Only)       Balance Overall balance assessment: Needs assistance Sitting-balance support: No upper extremity supported, Feet supported Sitting balance-Leahy Scale: Zero Sitting balance - Comments: totalA for attempts at  long sitting with +2 A Postural control: Posterior lean     Standing balance comment: defer, pt too lethargic                            Communication Communication Communication: Impaired Factors Affecting Communication: Difficulty expressing self;Other (comment) (lethargy today)   Cognition Arousal: Lethargic Behavior During Therapy: Flat affect   PT - Cognitive impairments: Memory, Problem solving, Safety/Judgement, Orientation, Awareness, Attention, Initiation, Sequencing, Difficult to assess Difficult to assess due to: Level of arousal Orientation impairments: Place, Time, Situation                   PT - Cognition Comments: Pt difficult to awaken, spouse arriving at end of session and appreciative of therapist efforts, she states that pt was very alert most of the previous date but this date has been very lethargic; Pt repositioned better in supine for comfort/pressure relief and to promote improved pulmonary clearance. Following commands: Impaired Following commands impaired: Follows one step commands inconsistently, Follows one step commands with increased time    Cueing Cueing Techniques: Verbal cues, Gestural cues, Tactile cues, Visual cues  Exercises      General Comments General comments (skin integrity, edema, etc.): pillows placed under BUE and bed in more chair type posture to promote improved pulmonary clearance, pt has secretion laden breath sounds. Defer EOB attmepts as pt too lethargic to follow instructions, even with sternal rub pt is barely opening his eyes.      Pertinent Vitals/Pain Pain Assessment Pain Assessment: PAINAD Breathing: normal Negative Vocalization: none Facial Expression: smiling or inexpressive Body Language: relaxed Consolability: no need to console PAINAD Score: 0 Pain Intervention(s): Monitored during session, Limited activity within patient's tolerance, Repositioned    Home Living                          Prior Function            PT Goals (current goals can now be found in the care plan section) Acute Rehab PT Goals Patient Stated Goal: To get better and walk again PT Goal Formulation: With patient/family Time For Goal Achievement: 01/15/24 Progress towards PT goals: Not progressing toward  goals - comment (slow progress this week 2/2 lethargy)    Frequency    Min 2X/week      PT Plan      Co-evaluation PT/OT/SLP Co-Evaluation/Treatment:  (attempted co-tx but pt too lethargic)            AM-PAC PT "6 Clicks" Mobility   Outcome Measure  Help needed turning from your back to your side while in a flat bed without using bedrails?: Total Help needed moving from lying on your back to sitting on the side of a flat bed without using bedrails?: Total Help needed moving to and from a bed to a chair (including a wheelchair)?: Total Help needed standing up from a chair using your arms (e.g., wheelchair or bedside chair)?: Total Help needed to walk in hospital room?: Total Help needed climbing 3-5 steps with a railing? : Total 6 Click Score: 6    End of Session   Activity Tolerance: Patient limited by lethargy Patient left: in bed;with call bell/phone within reach;with bed alarm set;with family/visitor present;Other (comment) (spouse arriving; pillows under BUE to promote more upright posture in supine with bed in more chair type orientation) Nurse Communication: Mobility status;Need for lift equipment;Precautions (lethargy) PT  Visit Diagnosis: Muscle weakness (generalized) (M62.81);Other abnormalities of gait and mobility (R26.89);Difficulty in walking, not elsewhere classified (R26.2) Pain - part of body:  (back)     Time: 3220-2542 PT Time Calculation (min) (ACUTE ONLY): 10 min  Charges:    $Therapeutic Activity: 8-22 mins PT General Charges $$ ACUTE PT VISIT: 1 Visit                     Kody Brandl P., PTA Acute Rehabilitation Services Secure Chat Preferred 9a-5:30pm Office: [phone]    Mariel Shope [facility] 01/05/2024, 2:52 PM

## 2024-01-05 NOTE — Plan of Care (Signed)
  Problem: Health Behavior/Discharge Planning: Goal: Ability to manage health-related needs will improve Outcome: Progressing   Problem: Clinical Measurements: Goal: Ability to maintain clinical measurements within normal limits will improve Outcome: Progressing Goal: Will remain free from infection Outcome: Progressing Goal: Diagnostic test results will improve Outcome: Progressing Goal: Respiratory complications will improve Outcome: Progressing Goal: Cardiovascular complication will be avoided Outcome: Progressing   Problem: Activity: Goal: Risk for activity intolerance will decrease Outcome: Progressing   Problem: Nutrition: Goal: Adequate nutrition will be maintained Outcome: Progressing   Problem: Coping: Goal: Level of anxiety will decrease Outcome: Progressing   Problem: Elimination: Goal: Will not experience complications related to bowel motility Outcome: Progressing Goal: Will not experience complications related to urinary retention Outcome: Progressing   Problem: Pain Managment: Goal: General experience of comfort will improve and/or be controlled Outcome: Progressing   Problem: Safety: Goal: Ability to remain free from injury will improve Outcome: Progressing   Problem: Skin Integrity: Goal: Risk for impaired skin integrity will decrease Outcome: Progressing   Problem: Education: Goal: Ability to verbalize activity precautions or restrictions will improve Outcome: Progressing Goal: Knowledge of the prescribed therapeutic regimen will improve Outcome: Progressing Goal: Understanding of discharge needs will improve Outcome: Progressing   Problem: Activity: Goal: Ability to avoid complications of mobility impairment will improve Outcome: Progressing Goal: Ability to tolerate increased activity will improve Outcome: Progressing Goal: Will remain free from falls Outcome: Progressing   Problem: Activity: Goal: Ability to tolerate increased  activity will improve Outcome: Progressing

## 2024-01-05 NOTE — Plan of Care (Signed)

## 2024-01-05 NOTE — Progress Notes (Signed)
 Patient ID: Norman Lambert, male   DOB: 1950-11-01, 73 y.o.   MRN: [id number] No real change today.  Remains lethargic.  Will look into his medications.  Continue IV antibiotics for now.

## 2024-01-06 DIAGNOSIS — G9341 Metabolic encephalopathy: Secondary | ICD-10-CM | POA: Diagnosis not present

## 2024-01-06 LAB — COMPREHENSIVE METABOLIC PANEL WITH GFR
ALT: 18 U/L (ref 0–44)
AST: 22 U/L (ref 15–41)
Albumin: 2 g/dL — ABNORMAL LOW (ref 3.5–5.0)
Alkaline Phosphatase: 87 U/L (ref 38–126)
Anion gap: 11 (ref 5–15)
BUN: 19 mg/dL (ref 8–23)
CO2: 21 mmol/L — ABNORMAL LOW (ref 22–32)
Calcium: 7.9 mg/dL — ABNORMAL LOW (ref 8.9–10.3)
Chloride: 107 mmol/L (ref 98–111)
Creatinine, Ser: 0.73 mg/dL (ref 0.61–1.24)
GFR, Estimated: >60 mL/min (ref >60)
Glucose, Bld: 87 mg/dL (ref 70–99)
Potassium: 3.7 mmol/L (ref 3.5–5.1)
Sodium: 139 mmol/L (ref 135–145)
Total Bilirubin: 0.9 mg/dL (ref 0.0–1.2)
Total Protein: 5.6 g/dL — ABNORMAL LOW (ref 6.5–8.1)

## 2024-01-06 LAB — GLUCOSE, CAPILLARY
Glucose-Capillary: 81 mg/dL (ref 70–99)
Glucose-Capillary: 83 mg/dL (ref 70–99)
Glucose-Capillary: 89 mg/dL (ref 70–99)
Glucose-Capillary: 100 mg/dL — ABNORMAL HIGH (ref 70–99)
Glucose-Capillary: 86 mg/dL (ref 70–99)
Glucose-Capillary: 77 mg/dL (ref 70–99)

## 2024-01-06 LAB — CBC
HCT: 22.8 % — ABNORMAL LOW (ref 39.0–52.0)
Hemoglobin: 7.1 g/dL — ABNORMAL LOW (ref 13.0–17.0)
MCH: 30.2 pg (ref 26.0–34.0)
MCHC: 31.1 g/dL (ref 30.0–36.0)
MCV: 97 fL (ref 80.0–100.0)
Platelets: 139 10*3/uL — ABNORMAL LOW (ref 150–400)
RBC: 2.35 MIL/uL — ABNORMAL LOW (ref 4.22–5.81)
RDW: 13.7 % (ref 11.5–15.5)
WBC: 7.8 10*3/uL (ref 4.0–10.5)
nRBC: 0 % (ref 0.0–0.2)

## 2024-01-06 MED ORDER — MELATONIN 3 MG PO TABS
3.0000 mg | ORAL_TABLET | Freq: Every evening | ORAL | Status: DC | PRN
  Administered 2024-01-08: 3 mg via ORAL
  Filled 2024-01-06: qty 1

## 2024-01-06 MED ORDER — GABAPENTIN 300 MG PO CAPS
300.0000 mg | ORAL_CAPSULE | Freq: Every day | ORAL | Status: DC
  Administered 2024-01-07: 300 mg via ORAL
  Filled 2024-01-06 (×2): qty 1

## 2024-01-06 NOTE — Plan of Care (Signed)
  Problem: Education: Goal: Knowledge of General Education information will improve Description: Including pain rating scale, medication(s)/side effects and non-pharmacologic comfort measures Outcome: Progressing   Problem: Health Behavior/Discharge Planning: Goal: Ability to manage health-related needs will improve Outcome: Progressing   Problem: Clinical Measurements: Goal: Ability to maintain clinical measurements within normal limits will improve Outcome: Progressing Goal: Will remain free from infection Outcome: Progressing Goal: Diagnostic test results will improve Outcome: Progressing Goal: Respiratory complications will improve Outcome: Progressing Goal: Cardiovascular complication will be avoided Outcome: Progressing   Problem: Activity: Goal: Risk for activity intolerance will decrease Outcome: Not Progressing   Problem: Nutrition: Goal: Adequate nutrition will be maintained Outcome: Progressing   Problem: Coping: Goal: Level of anxiety will decrease Outcome: Progressing   Problem: Elimination: Goal: Will not experience complications related to bowel motility Outcome: Progressing Goal: Will not experience complications related to urinary retention Outcome: Progressing   Problem: Pain Managment: Goal: General experience of comfort will improve and/or be controlled Outcome: Progressing   Problem: Safety: Goal: Ability to remain free from injury will improve Outcome: Progressing   Problem: Skin Integrity: Goal: Risk for impaired skin integrity will decrease Outcome: Progressing   Problem: Education: Goal: Ability to verbalize activity precautions or restrictions will improve Outcome: Progressing Goal: Knowledge of the prescribed therapeutic regimen will improve Outcome: Progressing Goal: Understanding of discharge needs will improve Outcome: Progressing   Problem: Activity: Goal: Ability to avoid complications of mobility impairment will  improve Outcome: Progressing Goal: Ability to tolerate increased activity will improve Outcome: Progressing Goal: Will remain free from falls Outcome: Progressing   Problem: Bowel/Gastric: Goal: Gastrointestinal status for postoperative course will improve Outcome: Progressing   Problem: Clinical Measurements: Goal: Ability to maintain clinical measurements within normal limits will improve Outcome: Progressing Goal: Postoperative complications will be avoided or minimized Outcome: Progressing Goal: Diagnostic test results will improve Outcome: Progressing   Problem: Pain Management: Goal: Pain level will decrease Outcome: Progressing   Problem: Skin Integrity: Goal: Will show signs of wound healing Outcome: Progressing   Problem: Health Behavior/Discharge Planning: Goal: Identification of resources available to assist in meeting health care needs will improve Outcome: Progressing   Problem: Bladder/Genitourinary: Goal: Urinary functional status for postoperative course will improve Outcome: Progressing

## 2024-01-06 NOTE — Plan of Care (Signed)

## 2024-01-06 NOTE — Progress Notes (Signed)
 PROGRESS NOTE    Norman Lambert  ZOX:[id number]  DOB: 1950/12/30  DOA: 12/05/2023 PCP: Patient, No Pcp Per Outpatient Specialists:   Hospital course:  73 year old male with past medical history of hypertension, gout, hyperlipidemia, hypothyroidism, sleep apnea was admitted on 4/25 for reduction and internal fixation of previous fusions involving L2-S1. Postoperative course initially seemed unremarkable but subsequently he continued to have increased pain involving the left foot seemingly not responding to as needed narcotics. Imaging of the back was unremarkable, he continued to have drain VAC in place.  Subsequently on 12/15/2023 patient had hypotension with tachycardia and fever up to 103 F.  Patient was then started on IV cefepime  and vancomycin  with concern about sepsis.  There was significant wound erythema with serosanguineous foul-smelling drainage from the surgical site.  Patient had Levophed  briefly but subsequently was weaned off    Significant hospital events 4/25 Extensive L2-S1 lumbar surgery 4/30 New onset pain  5/1 Ongoing pain with borderline blood pressure 5/2 Pain, leukocytosis fever, 5/3 Hypotension, not responding to crystalloid started on broad-spectrum antibiotics wound assessed by surgical team culture sent imaging pending ICU transfer. CT imaging of lumbar spine was negative for definitive abscess there were stable fractures at L3 and L4 with possible new fracture involving the transverse process of L2 and L3 on the left postoperative changes noted.CT angio of chest was negative for pulmonary emboli had bibasilar airspace disease the right was a little worse than the left consistent with possible pneumonia there was aortic aneurysmal dilation of the ascending aorta measuring at 4.5 cm 5/4 Weaned off norepinephrine , Blood cultures from 1 out of 2 samples growing gram-negative rods with BC ID showing both proteus and Enterobacterales, MRSA PCR pending. Narrowed to  ceftriaxone . 5/5 Remains on low dose NE. Febrile. BCx growing proteus.  5/6 Wound Cx with proteus, klebsiella aerogenes. Abx re-broadened to cefepime . 5/8 Required low dose norepi overnight but off this AM. His wound vac was changed this AM and has been having some pain 5/9 no issues overnight, denies any acute pain this a.m. 5/14-continues to have back pain. 5/17-has been advised bedrest for back pain.   5/19-removal of wound VAC.  PT evaluation. 5/20-concerns for aspiration show speech therapy was consulted,. for modified barium swallow. 5/21-speech therapy recommend regular diet.   Subjective:  Patient was asleep.  History was primarily per patient's wife who was at bedside.  She notes that she thinks is possibly being overmedicated from her pain medications as he is sleeping all the time.  She was wondering if it was possible to follow-up on some of the pain medications.  Otherwise no acute concerns   Objective: Vitals:   01/06/24 0347 01/06/24 0700 01/06/24 1237 01/06/24 1616  BP: (!) 101/58 (!) 146/120 (!) 123/48 130/70  Pulse: 88 77 66 71  Resp: 20 18 18 18   Temp: 98.7 F (37.1 C) 99.3 F (37.4 C) 99.4 F (37.4 C) 99 F (37.2 C)  TempSrc:  Axillary Oral Oral  SpO2: 100%  97% 98%  Weight:      Height:        Intake/Output Summary (Last 24 hours) at 01/06/2024 1943 Last data filed at 01/06/2024 0354 Gross per 24 hour  Intake 1536.15 ml  Output 750 ml  Net 786.15 ml   Filed Weights   12/20/23 2106 12/25/23 0500 12/25/23 1316  Weight: 92.6 kg 92.1 kg 92.1 kg     Exam:  General: Acute on chronically ill-appearing man lying in bed sleeping Eyes: sclera  anicteric, conjuctiva mild injection bilaterally CVS: S1-S2, regular  Respiratory:  decreased air entry bilaterally secondary to decreased inspiratory effort, rales at bases  GI: NABS, soft, NT  LE: 2+ edema  Data Reviewed:  Basic Metabolic Panel: Recent Labs  Lab 12/31/23 0604 01/03/24 0241 01/04/24 0157  01/05/24 0205 01/06/24 0840  NA 139 138 140 140 139  K 3.7 3.9 3.5 3.6 3.7  CL 105 105 107 105 107  CO2 24 20* 22 23 21*  GLUCOSE 79 72 96 108* 87  BUN 15 18 18 19 19   CREATININE 0.67 0.84 0.75 0.61 0.73  CALCIUM  8.4* 8.3* 8.2* 8.0* 7.9*  MG  --   --  1.9 1.8  --     CBC: Recent Labs  Lab 01/02/24 0601 01/03/24 0241 01/04/24 0157 01/05/24 0205 01/06/24 0840  WBC 8.9 8.4 9.5 8.3 7.8  HGB 7.7* 8.0* 7.5* 7.2* 7.1*  HCT 24.6* 25.6* 24.4* 23.3* 22.8*  MCV 94.6 97.7 96.4 95.9 97.0  PLT 212 225 185 161 139*     Scheduled Meds:  vitamin C   1,000 mg Oral Daily   calcitonin (salmon)  1 spray Alternating Nares Daily   Chlorhexidine  Gluconate Cloth  6 each Topical Daily   cyanocobalamin   1,000 mcg Oral Daily   enoxaparin  (LOVENOX ) injection  40 mg Subcutaneous Daily   feeding supplement  237 mL Oral BID BM   finasteride   5 mg Oral Daily   folic acid   1 mg Oral Daily   gabapentin   300 mg Oral TID   insulin  aspart  0-9 Units Subcutaneous Q6H   levothyroxine   112 mcg Oral Q0600   lidocaine   2 patch Transdermal Q24H   loratadine   10 mg Oral Daily   magnesium  oxide  400 mg Oral BID   melatonin  3 mg Oral QHS   midodrine   15 mg Oral TID WC   modafinil   200 mg Oral Daily   naproxen  250 mg Oral BID WC   oxyCODONE   10 mg Oral Q12H   pantoprazole   40 mg Oral Daily   polyethylene glycol  17 g Oral Daily   predniSONE   5 mg Oral Q breakfast   Followed by   [name] ON 01/10/2024] predniSONE   2.5 mg Oral Q breakfast   rosuvastatin   5 mg Oral Daily   senna  2 tablet Oral BID   sodium chloride  flush  3 mL Intravenous Q12H   sodium chloride  flush  3 mL Intravenous Q12H   sodium chloride  flush  3-10 mL Intravenous Q12H   zinc  sulfate (50mg  elemental zinc )  220 mg Oral Daily   Continuous Infusions:  ceFEPime  (MAXIPIME ) IV 2 g (01/06/24 1537)   DAPTOmycin  700 mg (01/06/24 1250)     Assessment & Plan:   Somnolence which patient's wife attributes to pain medications Acute metabolic  encephalopathy Patient is presently on gabapentin  300 p.o. 3 times daily and OxyContin  10 p.o. every 12 hours.  I will decrease his gabapentin  to 300 p.o. at bedtime and will change his melatonin to as needed rather than nightly.   Hypoglycemia DM 2 Blood sugars are under good control at present, no evidence of hypoglycemia Continue present management  HTN Blood pressure is normalized He remains on midodrine , can consider tapering  Postsurgical Proteus bacteremia Surgical wound cultures grew Klebsiella Rajan knees and Proteus mirabilis with positive blood cultures for Proteus on 12/16/2023.  Patient has been treated with steroids, presently being tapered and continues on cefepime  and daptomycin  per ID.  PAF Presently in NSR Anticoagulation being held due to recent surgery   Copied and pasted from previous note, not addressed today:  -Concerns for aspiration.    Speech therapy was consulted and patient has been put back on regular diet.  Status post modified barium swallow.  Continue aspiration precaution.  Continue nasal oxygen.   -Status post redo of laminectomy L3-4 with posterior fixation L2-S1 4/25 Continue pain management.  Repeat CT scan of the lumbar spine done 12/26/2023 shows fracture of the L5 pedicle.  Likely fragility fractures from osteoporosis from long-term steroids.  Currently on steroid taper.  Will try to minimize narcotics to avoid sedation.  Wound VAC has been removed.  PT OT consulted.  Plan for skilled nursing facility on discharge.    -Postop acute blood loss anemia   Received 1 unit of packed RBC with latest hemoglobin of 7.2 from initial 6.9.  No external blood loss noted.   - BPH -Continue Flomax , finasteride .   -GERD Continue PPI.   -Hypothyroidism Continue Synthroid .    DVT prophylaxis: Lovenox  Code Status: Full Family Communication: Wife was at bedside throughout     Studies: No results found.  Principal Problem:   Back pain Active  Problems:   S/P lumbar fusion   Septic shock (HCC)   Bacteremia due to Proteus species   Pressure injury of skin   Paroxysmal A-fib (HCC)   Acute postoperative anemia due to expected blood loss   Benign prostatic hyperplasia   Gastroesophageal reflux disease   Acute metabolic encephalopathy   Polymicrobial bacterial infection     Gurman Ashland Tublu Abbye Lao, [facility]  If 7PM-7AM, please contact night-coverage [email or web address]   LOS: 32 days

## 2024-01-06 NOTE — Progress Notes (Incomplete)
    Providing Compassionate, Quality Care - Together   NEUROSURGERY PROGRESS NOTE     S: NAEs o/n.    O: EXAM:  BP (!) 146/120 (BP Location: Left Arm)   Pulse 77   Temp 99.3 F (37.4 C) (Axillary)   Resp 18   Ht 6' (1.829 m)   Wt 92.1 kg   SpO2 100%   BMI 27.54 kg/m     Awake, alert, oriented  Speech fluent, appropriate  CNs grossly intact  BUE/BLE 5/5 SILTx4 Dressing c/d/i   ASSESSMENT:  73 y.o. s/p L3-4 revision/extension fusion 4/25 with subsequent washout on 5/12 now with bacteremia on IV abx    PLAN: -SNF placement pending -Cont supportive care -Call w/ questions/concerns.   Easter Golden, [facility]

## 2024-01-06 NOTE — Progress Notes (Signed)
 Afebrile Alert, deconditioned. Answering questions.  Fc x 4. Hgb 7.1  - cont to monitor anemia - optimize nutrition - cont therapies

## 2024-01-07 DIAGNOSIS — G9341 Metabolic encephalopathy: Secondary | ICD-10-CM | POA: Diagnosis not present

## 2024-01-07 LAB — GLUCOSE, CAPILLARY
Glucose-Capillary: 76 mg/dL (ref 70–99)
Glucose-Capillary: 80 mg/dL (ref 70–99)
Glucose-Capillary: 96 mg/dL (ref 70–99)
Glucose-Capillary: 100 mg/dL — ABNORMAL HIGH (ref 70–99)

## 2024-01-07 LAB — IRON AND TIBC
Iron: 11 ug/dL — ABNORMAL LOW (ref 45–182)
Saturation Ratios: 8 % — ABNORMAL LOW (ref 17.9–39.5)
TIBC: 133 ug/dL — ABNORMAL LOW (ref 250–450)
UIBC: 122 ug/dL

## 2024-01-07 LAB — RETICULOCYTES
Immature Retic Fract: 18.6 % — ABNORMAL HIGH (ref 2.3–15.9)
RBC.: 2.61 MIL/uL — ABNORMAL LOW (ref 4.22–5.81)
Retic Count, Absolute: 33.1 10*3/uL (ref 19.0–186.0)
Retic Ct Pct: 1.3 % (ref 0.4–3.1)

## 2024-01-07 LAB — VITAMIN B12: Vitamin B-12: 1456 pg/mL — ABNORMAL HIGH (ref 180–914)

## 2024-01-07 LAB — FERRITIN: Ferritin: 902 ng/mL — ABNORMAL HIGH (ref 24–336)

## 2024-01-07 LAB — FOLATE: Folate: 16.8 ng/mL (ref >5.9)

## 2024-01-07 NOTE — Progress Notes (Signed)
    Providing Compassionate, Quality Care - Together   NEUROSURGERY PROGRESS NOTE     S: NAEs o/n.    O: EXAM:  BP (!) 146/70 (BP Location: Left Arm)   Pulse 95   Temp 100.3 F (37.9 C) (Oral)   Resp 18   Ht 6' (1.829 m)   Wt 92.1 kg   SpO2 97%   BMI 27.54 kg/m      Awake, alert Speech fluent MAEs    ASSESSMENT:  73 y.o. s/p L3-4 revision/extension fusion 4/25 with subsequent washout on 5/12 now with bacteremia on IV abx    PLAN: -SNF placement pending -Cont supportive care -Call w/ questions/concerns.   Easter Golden, [facility]

## 2024-01-07 NOTE — Plan of Care (Signed)

## 2024-01-08 DIAGNOSIS — G9341 Metabolic encephalopathy: Secondary | ICD-10-CM | POA: Diagnosis not present

## 2024-01-08 LAB — COMPREHENSIVE METABOLIC PANEL WITH GFR
ALT: 18 U/L (ref 0–44)
AST: 22 U/L (ref 15–41)
Albumin: 2 g/dL — ABNORMAL LOW (ref 3.5–5.0)
Alkaline Phosphatase: 96 U/L (ref 38–126)
Anion gap: 14 (ref 5–15)
BUN: 17 mg/dL (ref 8–23)
CO2: 19 mmol/L — ABNORMAL LOW (ref 22–32)
Calcium: 7.9 mg/dL — ABNORMAL LOW (ref 8.9–10.3)
Chloride: 108 mmol/L (ref 98–111)
Creatinine, Ser: 0.87 mg/dL (ref 0.61–1.24)
GFR, Estimated: >60 mL/min (ref >60)
Glucose, Bld: 99 mg/dL (ref 70–99)
Potassium: 3.5 mmol/L (ref 3.5–5.1)
Sodium: 141 mmol/L (ref 135–145)
Total Bilirubin: 1.2 mg/dL (ref 0.0–1.2)
Total Protein: 6.4 g/dL — ABNORMAL LOW (ref 6.5–8.1)

## 2024-01-08 LAB — GLUCOSE, CAPILLARY
Glucose-Capillary: 108 mg/dL — ABNORMAL HIGH (ref 70–99)
Glucose-Capillary: 137 mg/dL — ABNORMAL HIGH (ref 70–99)
Glucose-Capillary: 87 mg/dL (ref 70–99)
Glucose-Capillary: 94 mg/dL (ref 70–99)
Glucose-Capillary: 98 mg/dL (ref 70–99)

## 2024-01-08 LAB — CBC
HCT: 26.9 % — ABNORMAL LOW (ref 39.0–52.0)
Hemoglobin: 8.2 g/dL — ABNORMAL LOW (ref 13.0–17.0)
MCH: 29 pg (ref 26.0–34.0)
MCHC: 30.5 g/dL (ref 30.0–36.0)
MCV: 95.1 fL (ref 80.0–100.0)
Platelets: 170 10*3/uL (ref 150–400)
RBC: 2.83 MIL/uL — ABNORMAL LOW (ref 4.22–5.81)
RDW: 13.9 % (ref 11.5–15.5)
WBC: 9.7 10*3/uL (ref 4.0–10.5)
nRBC: 0 % (ref 0.0–0.2)

## 2024-01-08 LAB — MAGNESIUM: Magnesium: 2.1 mg/dL (ref 1.7–2.4)

## 2024-01-08 MED ORDER — NYSTATIN 100000 UNIT/GM EX POWD
Freq: Three times a day (TID) | CUTANEOUS | Status: DC
  Filled 2024-01-08 (×2): qty 15

## 2024-01-08 MED ORDER — LACTATED RINGERS IV BOLUS
1000.0000 mL | Freq: Once | INTRAVENOUS | Status: AC
  Administered 2024-01-08: 1000 mL via INTRAVENOUS

## 2024-01-08 MED ORDER — FLEET ENEMA RE ENEM
1.0000 | ENEMA | Freq: Once | RECTAL | Status: AC
  Administered 2024-01-08: 1 via RECTAL
  Filled 2024-01-08: qty 1

## 2024-01-08 NOTE — Progress Notes (Signed)
 Physical Therapy Treatment Patient Details Name: Norman Lambert MRN: [id number] DOB: 1951/03/10 Today's Date: 01/08/2024   History of Present Illness 73 yo male admitted 12/05/23 with LB and severe back pain. 12/08/23 redo of laminectomy L3-4 with posterior fixation L2-S1; recovery complicated by pain and hypotension; 5/2 hypotension and tachycardia. 5/3 transfers to ICU, pressors, L2-3 TVP fx. 5/5-51/19 VAC placed. 5/19 P.Anselmo Bast. PMHx: gout, arthritis, HTN, HLD, HTN, PVD, RBBB, Rt reverse TSA, sleep apnea, 11/13/23 PLIF L3-4.    PT Comments  Pt lethargic with cues to maintain eyes open and attention, slow to respond to cue and questions. Pt with mod-max assist to roll, +2 assist for all mobility and unable to successfully engage to rise with stedy. +2 assist for limited standing attempts. Discussed progressive decline as pt cognitively and functionally not as engaged. Will continue to attempt to progress mobility and strength. Encouraged wife to promote extremity movement throughout the day and engage in conversation.     If plan is discharge home, recommend the following: Two people to help with walking and/or transfers;Two people to help with bathing/dressing/bathroom;Supervision due to cognitive status;Assistance with cooking/housework   Can travel by Doctor, hospital (measurements PT);Hoyer lift;Hospital bed;Wheelchair cushion (measurements PT)    Recommendations for Other Services       Precautions / Restrictions Precautions Precautions: Back;Fall;Other (comment) Precaution/Restrictions Comments: pt not stating any precautions this date, total assist to don brace Required Braces or Orthoses: Spinal Brace Spinal Brace: Lumbar corset;Applied in sitting position Other Brace: AFO for LLE- not donned this session     Mobility  Bed Mobility Overal bed mobility: Needs Assistance Bed Mobility: Rolling, Sidelying to Sit, Sit to  Sidelying Rolling: Mod assist Sidelying to sit: Max assist       General bed mobility comments: mod assist with rail and sequential cues to roll bil, max assist to rise from side to sitting. max assist to lift legs to surface to return to supine    Transfers Overall transfer level: Needs assistance   Transfers: Sit to/from Stand Sit to Stand: From elevated surface, +2 physical assistance, Mod assist           General transfer comment: with pt EOB and stedy present cued for standing x 4 trials with pt verbalizing understanding and willingness but not engaging UB or LB to assist with standing with +2 assist. Removed stedy and used pad and belt with +2 assist and knees blocked with pt able to clear buttock but remins flexed x 2 standing trials. Further mobility limited by pt need for BM. Pt returned to supine and placed on bedpan with RN made aware    Ambulation/Gait               General Gait Details: unable to attempt this date; lethargic   Stairs             Wheelchair Mobility     Tilt Bed    Modified Rankin (Stroke Patients Only)       Balance Overall balance assessment: Needs assistance Sitting-balance support: No upper extremity supported, Feet supported Sitting balance-Leahy Scale: Poor Sitting balance - Comments: pt with preference for UB support but able to maintain sitting for short periods with hands on thighs and min cues for anterior translation   Standing balance support: Bilateral upper extremity supported, During functional activity, Reliant on assistive device for balance Standing balance-Leahy Scale: Zero Standing balance comment: total +2 assist  Communication Communication Communication: Impaired Factors Affecting Communication: Hearing impaired;Difficulty expressing self  Cognition Arousal: Lethargic Behavior During Therapy: Flat affect   PT - Cognitive impairments: Memory, Problem solving,  Safety/Judgement, Orientation, Awareness, Attention, Initiation, Sequencing, Difficult to assess   Orientation impairments: Place, Time, Situation                   PT - Cognition Comments: pt with limited verbalization, cues to maintain eyes open. slow to respond to cues and commands, not oriented to day. Following commands: Impaired Following commands impaired: Follows one step commands inconsistently, Follows one step commands with increased time    Cueing Cueing Techniques: Verbal cues, Gestural cues, Tactile cues, Visual cues  Exercises      General Comments        Pertinent Vitals/Pain Pain Assessment Pain Assessment: PAINAD Breathing: normal Negative Vocalization: none Facial Expression: smiling or inexpressive Body Language: relaxed Consolability: no need to console PAINAD Score: 0 Pain Intervention(s): Repositioned    Home Living                          Prior Function            PT Goals (current goals can now be found in the care plan section) Progress towards PT goals: Not progressing toward goals - comment (progressive decline and lack of pt effort)    Frequency    Min 2X/week      PT Plan      Co-evaluation              AM-PAC PT "6 Clicks" Mobility   Outcome Measure  Help needed turning from your back to your side while in a flat bed without using bedrails?: Total Help needed moving from lying on your back to sitting on the side of a flat bed without using bedrails?: Total Help needed moving to and from a bed to a chair (including a wheelchair)?: Total Help needed standing up from a chair using your arms (e.g., wheelchair or bedside chair)?: Total Help needed to walk in hospital room?: Total Help needed climbing 3-5 steps with a railing? : Total 6 Click Score: 6    End of Session Equipment Utilized During Treatment: Back brace;Gait belt Activity Tolerance: Patient limited by lethargy Patient left: in bed;with call  bell/phone within reach;with family/visitor present Nurse Communication: Mobility status;Need for lift equipment;Precautions PT Visit Diagnosis: Muscle weakness (generalized) (M62.81);Other abnormalities of gait and mobility (R26.89);Difficulty in walking, not elsewhere classified (R26.2)     Time: 1610-9604 PT Time Calculation (min) (ACUTE ONLY): 25 min  Charges:    $Therapeutic Activity: 23-37 mins PT General Charges $$ ACUTE PT VISIT: 1 Visit                     Annis Baseman, PT Acute Rehabilitation Services Office: [phone]    Norman Lambert 01/08/2024, 10:41 AM

## 2024-01-08 NOTE — Progress Notes (Signed)
 PROGRESS NOTE    Norman Lambert  ZOX:[id number] DOB: [birth year] DOA: 12/05/2023 PCP: Patient, No Pcp Per    Hospital course:  73 year old male with past medical history of hypertension, gout, hyperlipidemia, hypothyroidism, sleep apnea was admitted on 4/25 for reduction and internal fixation of previous fusions involving L2-S1. Postoperative course initially seemed unremarkable but subsequently he continued to have increased pain involving the left foot seemingly not responding to as needed narcotics. Imaging of the back was unremarkable, he continued to have drain VAC in place.  Subsequently on 12/15/2023 patient had hypotension with tachycardia and fever up to 103 F.  Patient was then started on IV cefepime  and vancomycin  with concern about sepsis.  There was significant wound erythema with serosanguineous foul-smelling drainage from the surgical site.  Patient had Levophed  briefly but subsequently was weaned off    Significant hospital events 4/25 Extensive L2-S1 lumbar surgery 4/30 New onset pain  5/1 Ongoing pain with borderline blood pressure 5/2 Pain, leukocytosis fever, 5/3 Hypotension, not responding to crystalloid started on broad-spectrum antibiotics wound assessed by surgical team culture sent imaging pending ICU transfer. CT imaging of lumbar spine was negative for definitive abscess there were stable fractures at L3 and L4 with possible new fracture involving the transverse process of L2 and L3 on the left postoperative changes noted.CT angio of chest was negative for pulmonary emboli had bibasilar airspace disease the right was a little worse than the left consistent with possible pneumonia there was aortic aneurysmal dilation of the ascending aorta measuring at 4.5 cm 5/4 Weaned off norepinephrine , Blood cultures from 1 out of 2 samples growing gram-negative rods with BC ID showing both proteus and Enterobacterales, MRSA PCR negative. Narrowed to ceftriaxone . 5/5 Remains on low dose  NE. Febrile. BCx growing proteus.  5/6 Wound Cx with proteus, klebsiella aerogenes. Abx re-broadened to cefepime . 5/8 Required low dose norepi overnight but off this AM. His wound vac was changed this AM and has been having some pain 5/9 no issues overnight, denies any acute pain this a.m. 5/14-continues to have back pain. 5/17-has been advised bedrest for back pain.   5/19-removal of wound VAC.  PT evaluation. 5/20-concerns for aspiration show speech therapy was consulted,. for modified barium swallow. 5/21-speech therapy recommend regular diet.   Subjective:  Seen with patient's wife and granddaughter who is a Nurse, learning disability.  Daughter was concerned that we were not following his thyroid  function tests and although she understands he has anemia of chronic disease, does not understand why he is not on iron.  She also feels he needs to be on calcium  and vitamin D for his osteoporosis.  We discussed that the big picture focus should be on metabolic encephalopathy which seems to be resolving with decreasing his gabapentin  and melatonin, on his nutrition and on mobilizing him.  However I noted that it is not clear why he keeps on having fractures in his spine despite not being mobilized and that neurosurgery is following that.  Patient looks more alert and awake today, is minimally communicative during our conversation, seems to be following what we are saying.  Chin movements are much less.  Exam:  General: Patient is more awake and communicative today than yesterday.  Significantly decreased involuntary movements of lips.   Eyes: sclera anicteric, conjuctiva mild injection bilaterally CVS: S1-S2, regular  Respiratory: Transmitted upper respiratory sounds GI: NABS, soft, nontender to light palpation LE: 2+ edema  Assessment & Plan:  Acute metabolic encephalopathy Patient is definitely more awake, more  communicative less involuntary movements of lips since with decrease in gabapentin  and  discontinuation of melatonin.  Patient remains on modafinil . Will discontinue gabapentin  entirely.  Normocytic Anemia Anemia of Chronic Disease  H/H has been drifting down since transfusion for post op blood loss 5/17 It was drifting down to 7.1 but has increased today to 8 without transfusion  Patient is hemodynamically stable  Anemia labs c/w ACD   Nutrition Dysphagia  Evaluated by SLP who note globus, continue regular diet  Will request Dietary consult for evaluation of nutritional status  Albumin  2.0  S/p redo L3-4 Laminectomy 4/25 Sepsis sec to post op wound infection with Klebsiella and Proteus 5/3 Proteus bacteremia 5/3 Acute and chronic fractures L2-L5 without falls  Osteoporosis/long term steroid use  Ongoing back pain  Per NS note, unclear how patient continues to develop new fx w/o fall.mobilization On Cefepime  and Daptomycin  for 4 weeks, on 01/19/24  start oral levofloxacin  and linezolid   Continue Naprosyn, 250 bid,  OxyContin  10 every 12  Gabapentin  decreased to at bedtime as noted above with less somnolence  Continue calcitonin, got one dose of Zoledronic acid 5/20  DM 2 Hypoglycemia Blood sugars are under good control at present, no evidence of hypoglycemia Continue present management   HTN Blood pressure is normal off midrodrine Midrodrine discontinued 5/25   Paroxysmal A-fib  Chads 2 score 3 Presently in NSR, afib possibly sec to infection Echo EF 60% NWMA, mod concentril LVH Anticoagulation decision deferred to primary NS team    Copied and pasted from previous note, not addressed today:   -Concerns for aspiration.    Speech therapy was consulted and patient has been put back on regular diet.  Status post modified barium swallow.  Continue aspiration precaution.  Continue nasal oxygen.    - BPH -Continue Flomax , finasteride .   -GERD Continue PPI.   -Hypothyroidism Continue Synthroid .     DVT prophylaxis: Lovenox  Code Status: Full Family  Communication: Wife and granddaughter were at bedside throughout Disposition: SNF             Pressure Injury 12/16/23 Buttocks Stage 2 -  Partial thickness loss of dermis presenting as a shallow open injury with a red, pink wound bed without slough. (Active)  12/16/23 1300  Location: Buttocks  Location Orientation:   Staging: Stage 2 -  Partial thickness loss of dermis presenting as a shallow open injury with a red, pink wound bed without slough.  Wound Description (Comments):   Present on Admission:      Pressure Injury 12/16/23 Toe (Comment  which one) Anterior;Left Deep Tissue Pressure Injury - Purple or maroon localized area of discolored intact skin or blood-filled blister due to damage of underlying soft tissue from pressure and/or shear. (Active)  12/16/23 1300  Location: Toe (Comment  which one)  Location Orientation: Anterior;Left  Staging: Deep Tissue Pressure Injury - Purple or maroon localized area of discolored intact skin or blood-filled blister due to damage of underlying soft tissue from pressure and/or shear.  Wound Description (Comments):   Present on Admission:      Pressure Injury 12/20/23 Heel Right Deep Tissue Pressure Injury - Purple or maroon localized area of discolored intact skin or blood-filled blister due to damage of underlying soft tissue from pressure and/or shear. black (Active)  12/20/23 2000  Location: Heel  Location Orientation: Right  Staging: Deep Tissue Pressure Injury - Purple or maroon localized area of discolored intact skin or blood-filled blister due to damage of underlying soft tissue  from pressure and/or shear.  Wound Description (Comments): black  Present on Admission: No     Diet Orders (From admission, onward)     Start     Ordered   01/03/24 1056  Diet regular Room service appropriate? Yes with Assist; Fluid consistency: Thin  Diet effective now       Comments: Pills whole or crushed with puree (no pills with liquid  regardless of size)  Question Answer Comment  Room service appropriate? Yes with Assist   Fluid consistency: Thin      01/03/24 1055            Objective: Vitals:   01/07/24 2020 01/08/24 0330 01/08/24 0831 01/08/24 1223  BP: 129/72 139/64 134/72 125/60  Pulse: 80 97 100 (!) 102  Resp: 16 18 19 18   Temp: 98.2 F (36.8 C) 98.4 F (36.9 C) 98.9 F (37.2 C) 97.9 F (36.6 C)  TempSrc:  Oral Oral   SpO2: 97% 96% 97% 97%  Weight:      Height:        Intake/Output Summary (Last 24 hours) at 01/08/2024 1816 Last data filed at 01/08/2024 0745 Gross per 24 hour  Intake --  Output 500 ml  Net -500 ml   Filed Weights   12/20/23 2106 12/25/23 0500 12/25/23 1316  Weight: 92.6 kg 92.1 kg 92.1 kg    Scheduled Meds:  vitamin C   1,000 mg Oral Daily   calcitonin (salmon)  1 spray Alternating Nares Daily   Chlorhexidine  Gluconate Cloth  6 each Topical Daily   cyanocobalamin   1,000 mcg Oral Daily   enoxaparin  (LOVENOX ) injection  40 mg Subcutaneous Daily   feeding supplement  237 mL Oral BID BM   finasteride   5 mg Oral Daily   folic acid   1 mg Oral Daily   gabapentin   300 mg Oral QHS   insulin  aspart  0-9 Units Subcutaneous Q6H   levothyroxine   112 mcg Oral Q0600   loratadine   10 mg Oral Daily   magnesium  oxide  400 mg Oral BID   modafinil   200 mg Oral Daily   naproxen  250 mg Oral BID WC   nystatin   Topical TID   oxyCODONE   10 mg Oral Q12H   pantoprazole   40 mg Oral Daily   polyethylene glycol  17 g Oral Daily   predniSONE   5 mg Oral Q breakfast   Followed by   [name] ON 01/10/2024] predniSONE   2.5 mg Oral Q breakfast   rosuvastatin   5 mg Oral Daily   senna  2 tablet Oral BID   sodium chloride  flush  3 mL Intravenous Q12H   sodium chloride  flush  3 mL Intravenous Q12H   sodium chloride  flush  3-10 mL Intravenous Q12H   zinc  sulfate (50mg  elemental zinc )  220 mg Oral Daily   Continuous Infusions:  ceFEPime  (MAXIPIME ) IV 2 g (01/08/24 1709)   DAPTOmycin  700 mg  (01/08/24 1504)    Nutritional status     Body mass index is 27.54 kg/m.  Data Reviewed:   CBC: Recent Labs  Lab 01/03/24 0241 01/04/24 0157 01/05/24 0205 01/06/24 0840 01/08/24 0458  WBC 8.4 9.5 8.3 7.8 9.7  HGB 8.0* 7.5* 7.2* 7.1* 8.2*  HCT 25.6* 24.4* 23.3* 22.8* 26.9*  MCV 97.7 96.4 95.9 97.0 95.1  PLT 225 185 161 139* 170   Basic Metabolic Panel: Recent Labs  Lab 01/03/24 0241 01/04/24 0157 01/05/24 0205 01/06/24 0840 01/08/24 0458  NA 138 140 140 139  141  K 3.9 3.5 3.6 3.7 3.5  CL 105 107 105 107 108  CO2 20* 22 23 21* 19*  GLUCOSE 72 96 108* 87 99  BUN 18 18 19 19 17   CREATININE 0.84 0.75 0.61 0.73 0.87  CALCIUM  8.3* 8.2* 8.0* 7.9* 7.9*  MG  --  1.9 1.8  --  2.1   GFR: Estimated Creatinine Clearance: 81.8 mL/min (by C-G formula based on SCr of 0.87 mg/dL). Liver Function Tests: Recent Labs  Lab 01/06/24 0840 01/08/24 0458  AST 22 22  ALT 18 18  ALKPHOS 87 96  BILITOT 0.9 1.2  PROT 5.6* 6.4*  ALBUMIN  2.0* 2.0*   No results for input(s): "LIPASE", "AMYLASE" in the last 168 hours. No results for input(s): "AMMONIA" in the last 168 hours. Coagulation Profile: No results for input(s): "INR", "PROTIME" in the last 168 hours. Cardiac Enzymes: Recent Labs  Lab 01/04/24 0157  CKTOTAL 108   BNP (last 3 results) No results for input(s): "PROBNP" in the last 8760 hours. HbA1C: No results for input(s): "HGBA1C" in the last 72 hours. CBG: Recent Labs  Lab 01/07/24 1634 01/08/24 0007 01/08/24 0621 01/08/24 1219 01/08/24 1735  GLUCAP 100* 94 108* 137* 87   Lipid Profile: No results for input(s): "CHOL", "HDL", "LDLCALC", "TRIG", "CHOLHDL", "LDLDIRECT" in the last 72 hours. Thyroid  Function Tests: No results for input(s): "TSH", "T4TOTAL", "FREET4", "T3FREE", "THYROIDAB" in the last 72 hours. Anemia Panel: Recent Labs    01/07/24 1200  VITAMINB12 1,456*  FOLATE 16.8  FERRITIN 902*  TIBC 133*  IRON 11*  RETICCTPCT 1.3   Sepsis  Labs: No results for input(s): "PROCALCITON", "LATICACIDVEN" in the last 168 hours.  No results found for this or any previous visit (from the past 240 hours).       Radiology Studies: No results found.         LOS: 34 days   Time spent= 35 mins    Magdalene School, MD [facility]  If 7PM-7AM, please contact night-coverage  01/08/2024, 6:16 PM

## 2024-01-08 NOTE — Plan of Care (Signed)

## 2024-01-08 NOTE — Progress Notes (Signed)
 PROGRESS NOTE    Norman Lambert  ZOX:[id number]  DOB: Mar 03, 1951  DOA: 12/05/2023 PCP: Patient, No Pcp Per Outpatient Specialists:   Hospital course:  73 year old male with past medical history of hypertension, gout, hyperlipidemia, hypothyroidism, sleep apnea was admitted on 4/25 for reduction and internal fixation of previous fusions involving L2-S1. Postoperative course initially seemed unremarkable but subsequently he continued to have increased pain involving the left foot seemingly not responding to as needed narcotics. Imaging of the back was unremarkable, he continued to have drain VAC in place.  Subsequently on 12/15/2023 patient had hypotension with tachycardia and fever up to 103 F.  Patient was then started on IV cefepime  and vancomycin  with concern about sepsis.  There was significant wound erythema with serosanguineous foul-smelling drainage from the surgical site.  Patient had Levophed  briefly but subsequently was weaned off    Significant hospital events 4/25 Extensive L2-S1 lumbar surgery 4/30 New onset pain  5/1 Ongoing pain with borderline blood pressure 5/2 Pain, leukocytosis fever, 5/3 Hypotension, not responding to crystalloid started on broad-spectrum antibiotics wound assessed by surgical team culture sent imaging pending ICU transfer. CT imaging of lumbar spine was negative for definitive abscess there were stable fractures at L3 and L4 with possible new fracture involving the transverse process of L2 and L3 on the left postoperative changes noted.CT angio of chest was negative for pulmonary emboli had bibasilar airspace disease the right was a little worse than the left consistent with possible pneumonia there was aortic aneurysmal dilation of the ascending aorta measuring at 4.5 cm 5/4 Weaned off norepinephrine , Blood cultures from 1 out of 2 samples growing gram-negative rods with BC ID showing both proteus and Enterobacterales, MRSA PCR pending. Narrowed to  ceftriaxone . 5/5 Remains on low dose NE. Febrile. BCx growing proteus.  5/6 Wound Cx with proteus, klebsiella aerogenes. Abx re-broadened to cefepime . 5/8 Required low dose norepi overnight but off this AM. His wound vac was changed this AM and has been having some pain 5/9 no issues overnight, denies any acute pain this a.m. 5/14-continues to have back pain. 5/17-has been advised bedrest for back pain.   5/19-removal of wound VAC.  PT evaluation. 5/20-concerns for aspiration show speech therapy was consulted,. for modified barium swallow. 5/21-speech therapy recommend regular diet.   Subjective:  Patient is more awake today, wife agrees.  Still has involuntary movements of lower lip.  Does not verbalize but seems to be following me with his eyes more.   Objective: Vitals:   01/07/24 2020 01/08/24 0330 01/08/24 0831 01/08/24 1223  BP: 129/72 139/64 134/72 125/60  Pulse: 80 97 100 (!) 102  Resp: 16 18 19 18   Temp: 98.2 F (36.8 C) 98.4 F (36.9 C) 98.9 F (37.2 C) 97.9 F (36.6 C)  TempSrc:  Oral Oral   SpO2: 97% 96% 97% 97%  Weight:      Height:        Intake/Output Summary (Last 24 hours) at 01/08/2024 1817 Last data filed at 01/08/2024 0745 Gross per 24 hour  Intake --  Output 500 ml  Net -500 ml   Filed Weights   12/20/23 2106 12/25/23 0500 12/25/23 1316  Weight: 92.6 kg 92.1 kg 92.1 kg     Exam:  General: Acute on chronically ill-appearing man lying in bed sleeping Eyes: sclera anicteric, conjuctiva mild injection bilaterally CVS: S1-S2, regular  Respiratory:  decreased air entry bilaterally secondary to decreased inspiratory effort, rales at bases  GI: NABS, soft, NT  LE:  2+ edema  Data Reviewed:  Basic Metabolic Panel: Recent Labs  Lab 01/03/24 0241 01/04/24 0157 01/05/24 0205 01/06/24 0840 01/08/24 0458  NA 138 140 140 139 141  K 3.9 3.5 3.6 3.7 3.5  CL 105 107 105 107 108  CO2 20* 22 23 21* 19*  GLUCOSE 72 96 108* 87 99  BUN 18 18 19 19 17    CREATININE 0.84 0.75 0.61 0.73 0.87  CALCIUM  8.3* 8.2* 8.0* 7.9* 7.9*  MG  --  1.9 1.8  --  2.1    CBC: Recent Labs  Lab 01/03/24 0241 01/04/24 0157 01/05/24 0205 01/06/24 0840 01/08/24 0458  WBC 8.4 9.5 8.3 7.8 9.7  HGB 8.0* 7.5* 7.2* 7.1* 8.2*  HCT 25.6* 24.4* 23.3* 22.8* 26.9*  MCV 97.7 96.4 95.9 97.0 95.1  PLT 225 185 161 139* 170     Scheduled Meds:  vitamin C   1,000 mg Oral Daily   calcitonin (salmon)  1 spray Alternating Nares Daily   Chlorhexidine  Gluconate Cloth  6 each Topical Daily   cyanocobalamin   1,000 mcg Oral Daily   enoxaparin  (LOVENOX ) injection  40 mg Subcutaneous Daily   feeding supplement  237 mL Oral BID BM   finasteride   5 mg Oral Daily   folic acid   1 mg Oral Daily   gabapentin   300 mg Oral QHS   insulin  aspart  0-9 Units Subcutaneous Q6H   levothyroxine   112 mcg Oral Q0600   loratadine   10 mg Oral Daily   magnesium  oxide  400 mg Oral BID   modafinil   200 mg Oral Daily   naproxen  250 mg Oral BID WC   nystatin   Topical TID   oxyCODONE   10 mg Oral Q12H   pantoprazole   40 mg Oral Daily   polyethylene glycol  17 g Oral Daily   predniSONE   5 mg Oral Q breakfast   Followed by   [name] ON 01/10/2024] predniSONE   2.5 mg Oral Q breakfast   rosuvastatin   5 mg Oral Daily   senna  2 tablet Oral BID   sodium chloride  flush  3 mL Intravenous Q12H   sodium chloride  flush  3 mL Intravenous Q12H   sodium chloride  flush  3-10 mL Intravenous Q12H   zinc  sulfate (50mg  elemental zinc )  220 mg Oral Daily   Continuous Infusions:  ceFEPime  (MAXIPIME ) IV 2 g (01/08/24 1709)   DAPTOmycin  700 mg (01/08/24 1504)     Assessment & Plan:   Somnolence which patient's wife attributes to pain medications Acute metabolic encephalopathy Patient definitely is more awake with decreased gabapentin  now at 300 p.o. at bedtime and off of his melatonin.  Hypoglycemia DM 2 Blood sugars are under good control at present, no evidence of hypoglycemia Continue present  management  HTN Blood pressure is normalized He remains on midodrine , can consider tapering  Postsurgical Proteus bacteremia Surgical wound cultures grew Klebsiella Norman Lambert and Proteus mirabilis with positive blood cultures for Proteus on 12/16/2023.  Patient has been treated with steroids, presently being tapered and continues on cefepime  and daptomycin  per ID.  PAF Presently in NSR Anticoagulation being held due to recent surgery   Copied and pasted from previous note, not addressed today:  -Concerns for aspiration.    Speech therapy was consulted and patient has been put back on regular diet.  Status post modified barium swallow.  Continue aspiration precaution.  Continue nasal oxygen.   -Status post redo of laminectomy L3-4 with posterior fixation L2-S1 4/25 Continue pain  management.  Repeat CT scan of the lumbar spine done 12/26/2023 shows fracture of the L5 pedicle.  Likely fragility fractures from osteoporosis from long-term steroids.  Currently on steroid taper.  Will try to minimize narcotics to avoid sedation.  Wound VAC has been removed.  PT OT consulted.  Plan for skilled nursing facility on discharge.    -Postop acute blood loss anemia   Received 1 unit of packed RBC with latest hemoglobin of 7.2 from initial 6.9.  No external blood loss noted.   - BPH -Continue Flomax , finasteride .   -GERD Continue PPI.   -Hypothyroidism Continue Synthroid .    DVT prophylaxis: Lovenox  Code Status: Full Family Communication: Wife was at bedside throughout     Studies: No results found.  Principal Problem:   Back pain Active Problems:   S/P lumbar fusion   Septic shock (HCC)   Bacteremia due to Proteus species   Pressure injury of skin   Paroxysmal A-fib (HCC)   Acute postoperative anemia due to expected blood loss   Benign prostatic hyperplasia   Gastroesophageal reflux disease   Acute metabolic encephalopathy   Polymicrobial bacterial infection     Angello Chien  [place] Railsback, [facility]  If 7PM-7AM, please contact night-coverage [email or web address]   LOS: 34 days

## 2024-01-08 NOTE — Progress Notes (Signed)
 SLP Cancellation Note  Patient Details Name: JOHNHENRY TIPPIN MRN: [id number] DOB: [birth year]   Cancelled treatment:       Reason Eval/Treat Not Completed: Patient at procedure or test/unavailable (receiving nursing care). Will f/u.    Amil Kale, M.A., CCC-SLP Speech Language Pathology, Acute Rehabilitation Services  Secure Chat preferred [phone]  01/08/2024, 5:04 PM

## 2024-01-09 ENCOUNTER — Other Ambulatory Visit (HOSPITAL_COMMUNITY): Payer: Self-pay

## 2024-01-09 DIAGNOSIS — Z66 Do not resuscitate: Secondary | ICD-10-CM | POA: Diagnosis not present

## 2024-01-09 DIAGNOSIS — G9341 Metabolic encephalopathy: Secondary | ICD-10-CM | POA: Diagnosis not present

## 2024-01-09 DIAGNOSIS — Z515 Encounter for palliative care: Secondary | ICD-10-CM | POA: Diagnosis not present

## 2024-01-09 DIAGNOSIS — R7881 Bacteremia: Secondary | ICD-10-CM | POA: Diagnosis not present

## 2024-01-09 DIAGNOSIS — M5440 Lumbago with sciatica, unspecified side: Secondary | ICD-10-CM | POA: Diagnosis not present

## 2024-01-09 DIAGNOSIS — E43 Unspecified severe protein-calorie malnutrition: Secondary | ICD-10-CM | POA: Insufficient documentation

## 2024-01-09 DIAGNOSIS — R5381 Other malaise: Secondary | ICD-10-CM | POA: Diagnosis not present

## 2024-01-09 DIAGNOSIS — Z7189 Other specified counseling: Secondary | ICD-10-CM | POA: Diagnosis not present

## 2024-01-09 DIAGNOSIS — D62 Acute posthemorrhagic anemia: Secondary | ICD-10-CM | POA: Diagnosis not present

## 2024-01-09 LAB — GLUCOSE, CAPILLARY
Glucose-Capillary: 117 mg/dL — ABNORMAL HIGH (ref 70–99)
Glucose-Capillary: 85 mg/dL (ref 70–99)
Glucose-Capillary: 89 mg/dL (ref 70–99)
Glucose-Capillary: 90 mg/dL (ref 70–99)
Glucose-Capillary: 95 mg/dL (ref 70–99)
Glucose-Capillary: 99 mg/dL (ref 70–99)

## 2024-01-09 MED ORDER — CALCIUM CARBONATE 1250 (500 CA) MG PO TABS
1.0000 | ORAL_TABLET | Freq: Every day | ORAL | Status: DC
  Administered 2024-01-10 – 2024-01-12 (×2): 1250 mg via ORAL
  Filled 2024-01-09 (×2): qty 1

## 2024-01-09 MED ORDER — ZINC SULFATE 220 (50 ZN) MG PO CAPS
220.0000 mg | ORAL_CAPSULE | Freq: Every day | ORAL | Status: DC
  Administered 2024-01-10 – 2024-01-12 (×3): 220 mg via ORAL
  Filled 2024-01-09 (×3): qty 1

## 2024-01-09 MED ORDER — ADULT MULTIVITAMIN W/MINERALS CH
1.0000 | ORAL_TABLET | Freq: Every day | ORAL | Status: DC
  Administered 2024-01-09 – 2024-01-12 (×4): 1 via ORAL
  Filled 2024-01-09 (×4): qty 1

## 2024-01-09 MED ORDER — VITAMIN C 500 MG PO TABS
1000.0000 mg | ORAL_TABLET | Freq: Every day | ORAL | Status: DC
  Administered 2024-01-10 – 2024-01-12 (×3): 1000 mg via ORAL
  Filled 2024-01-09 (×3): qty 2

## 2024-01-09 MED ORDER — CHOLECALCIFEROL 10 MCG (400 UNIT) PO TABS
400.0000 [IU] | ORAL_TABLET | Freq: Every day | ORAL | Status: DC
  Administered 2024-01-09: 400 [IU] via ORAL
  Filled 2024-01-09: qty 1

## 2024-01-09 MED ORDER — VITAMIN D 25 MCG (1000 UNIT) PO TABS
1000.0000 [IU] | ORAL_TABLET | Freq: Every day | ORAL | Status: DC
  Administered 2024-01-10 – 2024-01-12 (×3): 1000 [IU] via ORAL
  Filled 2024-01-09 (×3): qty 1

## 2024-01-09 MED ORDER — FERROUS SULFATE 325 (65 FE) MG PO TABS
325.0000 mg | ORAL_TABLET | Freq: Every day | ORAL | Status: DC
  Administered 2024-01-10 – 2024-01-12 (×2): 325 mg via ORAL
  Filled 2024-01-09 (×2): qty 1

## 2024-01-09 MED ORDER — THIAMINE MONONITRATE 100 MG PO TABS
100.0000 mg | ORAL_TABLET | Freq: Every day | ORAL | Status: DC
  Administered 2024-01-09 – 2024-01-12 (×4): 100 mg via ORAL
  Filled 2024-01-09 (×4): qty 1

## 2024-01-09 MED ORDER — PROSOURCE PLUS PO LIQD
30.0000 mL | Freq: Two times a day (BID) | ORAL | Status: DC
  Administered 2024-01-09 – 2024-01-12 (×3): 30 mL via ORAL
  Filled 2024-01-09 (×5): qty 30

## 2024-01-09 NOTE — Progress Notes (Signed)
 Palliative:  HPI: 73 y.o. male with past medical history of hypertension, hyperlipidemia, hypothyroidism, gout, sleep apnea admitted on 12/05/2023 with severe back pain with evidence of increasing kyphosis at L3-4 with retrolisthesis. S/P 4/25 reduction and internal fixation of previous fusions involving L2-S1. Hospitalization complicated by septic shock s/t Proteus bacteremia from postsurgical infection/possible pneumonia and ongoing back pain. Follow up scans with evidence of new fractures of lumbar spine thought to be fragility fractures due to osteoporosis and long-term steroids - no good options and not a good candidate for repeat surgical intervention.    Chart review completed. Noted changes to medication. I met today with Norman Lambert and wife Norman Lambert at bedside. Norman Lambert has recently worked with OT and is now very tired. His pain seems controlled and he is not complaining or asking for medication. Norman Lambert reports that he has seemed to have better pain control and has not complained much the past few days. She is pleased with decreased pain medication and feels that he has been more alert. She reports that his appetite is still struggling. We discussed bringing in foods he desires and having favorite snacks available at bedside to offer between meals. We also discussed Magic Cups.   I further discussed with Norman Lambert code status. We acknowledged that goals are continued to hope for further improvement as much as possible. We also discussed the importance to consider in the future what he would want to go through acknowledging his difficult situation and quality of life now. Norman Lambert express understanding. She confirms that they have discussed and neither of them would desire resuscitation. She notes their faith and although they want to spend as much time with family they do not want to suffer. DNR/DNI confirmed. Dietician in to discuss nutrition further.   All questions/concerns addressed. Emotional support provided.  Updated Dr. Ariel Begun.   Exam: Sleepy but awakens with ease. Appropriate in conversation. No distress. Breathing regular, unlabored. Abd soft. Generalized weakness.   Plan: DNR decided Continue rehab efforts Low Back Pain: OxyCONTIN  10 mg q12h.  Dilaudid  0.5 mg IV q4h PRN.  Naproxen 250 mg BID. Calcitonin nasal spray.  Zoledronic acid 5/20.  Prednisone  taper. May consider another burst if needed for pain control in future.  Consider scheduled and/or PRN muscle relaxer.   Bowel Regimen: LBM 5/27 Senokot 2 tabs BID.  Miralax  daily.   50 min  Vila Grayer, NP Palliative Medicine Team Pager [phone] (Please see [email or web address] for schedule) Team Phone [phone]

## 2024-01-09 NOTE — Progress Notes (Signed)
 Speech Language Pathology Treatment: Dysphagia  Patient Details Name: Norman Lambert MRN: [id number] DOB: [birth year] Today's Date: 01/09/2024 Time: 4098-1191 SLP Time Calculation (min) (ACUTE ONLY): 13 min  Assessment / Plan / Recommendation Clinical Impression  Pt was agreeable to be being positioned as upright as possible in bed without reported pain. He took two sips of water before exhibiting immediate throat clearance and eructation. This progressed to gagging and eventually, multiple bouts of regurgitation which appeared to primarily consist of water. Although the MBS 5/21 did not necessarily show significant esophageal retention or retrograde bolus flow, pt's clinical presentation today and in prior sessions has been suspicious for an esophageal component (frequent throat clearance following solids, globus sensation, and eructation). Provided education to pt and his wife and will discuss further with MD and RN. SLP will continue following.    HPI HPI: Norman Lambert is a 73 yo male presenting to ED 4/22 with severe lower back pain. Underwent laminectomy L3-4 with posterior fixation L2-S1. Recovery complicated by pain and hypotension and was transferred to ICU 5/3. PMH includes gout, arthritis, HTN, HLD, PVD, RBBB, R reverse TSA, sleep apnea      SLP Plan  Continue with current plan of care      Recommendations for follow up therapy are one component of a multi-disciplinary discharge planning process, led by the attending physician.  Recommendations may be updated based on patient status, additional functional criteria and insurance authorization.    Recommendations  Diet recommendations: Regular;Thin liquid Liquids provided via: Cup;Straw Medication Administration: Crushed with puree Supervision: Staff to assist with self feeding;Full supervision/cueing for compensatory strategies Compensations: Minimize environmental distractions;Slow rate;Small sips/bites Postural Changes and/or  Swallow Maneuvers: Seated upright 90 degrees;Upright 30-60 min after meal                  Oral care QID;Staff/trained caregiver to provide oral care   Frequent or constant Supervision/Assistance Dysphagia, oropharyngeal phase (R13.12)     Continue with current plan of care     Amil Kale, M.A., CCC-SLP Speech Language Pathology, Acute Rehabilitation Services  Secure Chat preferred [phone]   01/09/2024, 10:49 AM

## 2024-01-09 NOTE — Progress Notes (Addendum)
 Initial Nutrition Assessment  DOCUMENTATION CODES:   Not applicable, Severe malnutrition in context of chronic illness  INTERVENTION:   Encourage PO intake Room service with assist Assist with feeding pt if wife is not present  Encourage family to bring in pt's favorite foods as able  Ensure Enlive po BID, each supplement provides 350 kcal and 20 grams of protein. -Prefers chocolate Magic cup TID with meals, each supplement provides 290 kcal and 9 grams of protein - Prefers wild berry  Prosource Plus 30 ml BID, each supplement provides 100 kcal and 15 gm protein  MVI with minerals daily 100 mg Thiamine daily  1,000 mg Vitamin C  x 30 days, 220 mg zinc  x 15 days for wound healing  Increase Vitamin D supplementation to 1,000 units per day  Continue Vitamin B-12, folic acid  daily  If continued poor PO intake, may benefit from enteral nutrition.  NUTRITION DIAGNOSIS:   Severe Malnutrition related to chronic illness as evidenced by severe fat depletion, severe muscle depletion, energy intake < or equal to 75% for > or equal to 1 month, percent weight loss (24 lbs weight loss, 11% in 2 months).   GOAL:   Patient will meet greater than or equal to 90% of their needs   MONITOR:   PO intake, Supplement acceptance, Skin, Labs, I & O's  REASON FOR ASSESSMENT:   Consult Assessment of nutrition requirement/status, Poor PO  ASSESSMENT:  73 y.o male admitted for severe back pain a couple weeks after his lumbar fusion surgery (11/13/23). Pt underwent ORIF of the previous fusion and fractures on (12/08/23).  Continues with back and leg pain with concern now for sepsis from surgical wound and acute metabolic encephlopathy. PMH of HTN, gout, GLD, hypothyroidism, sleep apnea, GERD.  3/31: Lumbar fusion surgery  4/22: Admitted  4/25: ORIF of previous fusion and fractures 5/13: CT scan showed fraacture of L5 pedicle  5/20: Concerns for aspiraiton, SLP consulted 5/21: SP recommends regular  diet, thin liquids   Pt resting on visit, just got done working with PT/OT, wife at bedside. Pt was very lethargic on visit, wife provided pt's history.  Pt had a fall in January of this year and since then has been having issues with his back. At this point pt was still eating good and able to ambulate. He was living at home with his wife and was doing most of the cooking for them.  Was eating 3 meals/day with snacks, no issues with weight loss at that time.   Pt's spouse reports she noticed a drastic decline after his lumbar fusion surgery in March where he became bedbound and was only eating bites <25% of his meals. She reports foods that pt used to loves taste different to him and he does not want to eat them. She contributes this to the medications pt has been on for his pain.   Wife reports his UBW to be around 230 lbs. Pt has lost 24 lbs, 11% of his body weight in 2 months. On exam pt with severe muscle and fat wasting.   Pt has been admitted for 35 days now and wife continues to endorse poor PO intake and appetite. Has been drinking 75% of an Ensure almost every day. This morning pt did not eat breakfast, only had a couple strawberries Took pt's lunch order from spouse. Wife has been providing feeding assistance.  Encouraged family to bring in pt's favorite meals as wife reports pt is a picky eater. Wife agrees that pt  may eat more if outside food brought in .   RD explained the importance of ONS at this time. Spouse agreeable to trying Prosource and Magic cups. Pt at refeeding risk if PO intake increases.   Admit weight: 103 kg  Current weight: 92.1 kg    Average Meal Intake: 5/15: 40-50% x 2 meals 5/26: 50-60% x 2 meals 5/25: 100% x 2 meals   Nutritionally Relevant Medications: Scheduled Meds:  (feeding supplement) PROSource Plus  30 mL Oral BID BM   [START ON 01/10/2024] vitamin C   1,000 mg Oral Daily   calcitonin (salmon)  1 spray Alternating Nares Daily   [START ON 01/10/2024]  calcium  carbonate  1 tablet Oral Q breakfast   Chlorhexidine  Gluconate Cloth  6 each Topical Daily   [START ON 01/10/2024] cholecalciferol  1,000 Units Oral Daily   cyanocobalamin   1,000 mcg Oral Daily   enoxaparin  (LOVENOX ) injection  40 mg Subcutaneous Daily   feeding supplement  237 mL Oral BID BM   [START ON 01/10/2024] ferrous sulfate  325 mg Oral Q breakfast   finasteride   5 mg Oral Daily   folic acid   1 mg Oral Daily   insulin  aspart  0-9 Units Subcutaneous Q6H   levothyroxine   112 mcg Oral Q0600   loratadine   10 mg Oral Daily   magnesium  oxide  400 mg Oral BID   modafinil   200 mg Oral Daily   multivitamin with minerals  1 tablet Oral Daily   naproxen  250 mg Oral BID WC   nystatin   Topical TID   oxyCODONE   10 mg Oral Q12H   pantoprazole   40 mg Oral Daily   polyethylene glycol  17 g Oral Daily   predniSONE   5 mg Oral Q breakfast   Followed by   [name] ON 01/10/2024] predniSONE   2.5 mg Oral Q breakfast   rosuvastatin   5 mg Oral Daily   senna  2 tablet Oral BID   sodium chloride  flush  3 mL Intravenous Q12H   thiamine  100 mg Oral Daily   [START ON 01/10/2024] zinc  sulfate (50mg  elemental zinc )  220 mg Oral Daily   Continuous Infusions:  ceFEPime  (MAXIPIME ) IV 2 g (01/09/24 0900)   DAPTOmycin  700 mg (01/09/24 1315)   Labs Reviewed: Calcium  7.9 Albumin  2 Total protein 6.4 Iron 11 CBG ranges from 85-137 mg/dL over the last 24 hours No A1C  NUTRITION - FOCUSED PHYSICAL EXAM:  Flowsheet Row Most Recent Value  Orbital Region Severe depletion  Upper Arm Region Moderate depletion  Thoracic and Lumbar Region Unable to assess  [Edema]  Buccal Region Severe depletion  Temple Region Severe depletion  Clavicle Bone Region Severe depletion  Clavicle and Acromion Bone Region Severe depletion  Scapular Bone Region Severe depletion  Dorsal Hand Severe depletion  Patellar Region Severe depletion  Anterior Thigh Region Severe depletion  Posterior Calf Region Severe depletion   Edema (RD Assessment) Mild  Hair Reviewed  Eyes Reviewed  Mouth Reviewed  Skin Reviewed  [Discolored]  Nails Reviewed       Diet Order:   Diet Order             Diet regular Room service appropriate? Yes with Assist; Fluid consistency: Thin  Diet effective now                   EDUCATION NEEDS:   Not appropriate for education at this time  Skin:  Skin Assessment: Skin Integrity Issues: Skin Integrity Issues:: Stage  II, DTI, Incisions DTI: Left toe, right heel Stage II: Buttocks Incisions: Back, Left pretibial  Last BM:  01/08/24 - type 3  Height:   Ht Readings from Last 1 Encounters:  12/25/23 6' (1.829 m)    Weight:   Wt Readings from Last 1 Encounters:  12/25/23 92.1 kg    Ideal Body Weight:  80.9 kg  BMI:  Body mass index is 27.54 kg/m.  Estimated Nutritional Needs:   Kcal:  2200-2400 kcal  Protein:  120-140 gm  Fluid:  >2L/day   Frederik Jansky, RD Registered Dietitian  See Amion for more information

## 2024-01-09 NOTE — Plan of Care (Signed)

## 2024-01-09 NOTE — Progress Notes (Signed)
 Occupational Therapy Treatment Patient Details Name: Norman Lambert MRN: [id number] DOB: Apr 19, 1951 Today's Date: 01/09/2024   History of present illness 73 yo male admitted 12/05/23 with LB and severe back pain. 12/08/23 redo of laminectomy L3-4 with posterior fixation L2-S1; recovery complicated by pain and hypotension; 5/2 hypotension and tachycardia. 5/3 transfers to ICU, pressors, L2-3 TVP fx. 5/5-51/19 VAC placed. 5/19 P.Anselmo Bast. PMHx: gout, arthritis, HTN, HLD, HTN, PVD, RBBB, Rt reverse TSA, sleep apnea, 11/13/23 PLIF L3-4.   OT comments  Pt more alert than last week, able to follow commands with increased time >50% of the time. Pt  bed level max A x2 for rolling L/R for toileting. Pt presents with sores throughout sacral/scrotal region, noted leg scabs with delicate skin in BLEs, very small amount of blood on a cut on distal LLE after session from bed mobility. Pt max A x2 for assist to EOB. Attempted to stand with Stedy 3X, able to raise bottom off bed for up to 5 seconds. Pt assisted back to bed. After laying the bed flat, bed controls stopped working. Attempted to fix and unplug and plug back in bed but still not working. Pt made comfortable and staff informed, stated they would order a new bed. Will continue to see Pt acutely to progress strength and participation as able, DC to postacute rehab <3hrs/day still appropriate.       If plan is discharge home, recommend the following:  Two people to help with walking and/or transfers;Two people to help with bathing/dressing/bathroom   Equipment Recommendations  None recommended by OT    Recommendations for Other Services      Precautions / Restrictions Precautions Precautions: Back;Fall;Other (comment) Precaution Booklet Issued: Yes (comment) Recall of Precautions/Restrictions: Impaired Precaution/Restrictions Comments: pt not stating any precautions this date, total assist to don brace Required Braces or Orthoses: Spinal Brace Spinal  Brace: Lumbar corset;Applied in sitting position Other Brace: AFO for LLE- not donned this session Restrictions Weight Bearing Restrictions Per Provider Order: No       Mobility Bed Mobility Overal bed mobility: Needs Assistance Bed Mobility: Rolling, Supine to Sit, Sit to Supine Rolling: Max assist Sidelying to sit: Max assist, +2 for physical assistance, HOB elevated     Sit to sidelying: Max assist, +2 for physical assistance General bed mobility comments: max A x2 in/out of bed, max cueing.    Transfers Overall transfer level: Needs assistance Equipment used: Ambulation equipment used Transfers: Sit to/from Stand Sit to Stand: Max assist, +2 physical assistance, +2 safety/equipment, From elevated surface, Via lift equipment           General transfer comment: max A x2 for STS with Stedy, stands for less than 5 seconds, does not stand fully upright. Transfer via Lift Equipment: Stedy   Balance Overall balance assessment: Needs assistance Sitting-balance support: Single extremity supported, Feet supported Sitting balance-Leahy Scale: Poor     Standing balance support: Bilateral upper extremity supported, During functional activity, Reliant on assistive device for balance Standing balance-Leahy Scale: Zero Standing balance comment: max A x2 with Stedy, does not stand fully upright                           ADL either performed or assessed with clinical judgement   ADL Overall ADL's : Needs assistance/impaired                 Upper Body Dressing : Moderate assistance;Sitting   Lower Body Dressing:  Total assistance;Bed level   Toilet Transfer: Maximal assistance;+2 for physical assistance;+2 for safety/equipment   Toileting- Clothing Manipulation and Hygiene: Total assistance;Bed level         General ADL Comments: Pt bed level for LB dressing, toileting, not able to stand without max A x2 with Stedy, stands for less than 5 seconds each  stand.    Extremity/Trunk Assessment Upper Extremity Assessment Upper Extremity Assessment: Generalized weakness            Vision       Perception     Praxis     Communication Communication Communication: Impaired Factors Affecting Communication: Hearing impaired;Difficulty expressing self;Reduced clarity of speech   Cognition Arousal: Alert Behavior During Therapy: Flat affect Cognition: No apparent impairments             OT - Cognition Comments: Pt alert but has difficulty communicating needs                 Following commands: Impaired Following commands impaired: Follows one step commands inconsistently, Follows one step commands with increased time      Cueing   Cueing Techniques: Verbal cues, Gestural cues, Tactile cues, Visual cues  Exercises      Shoulder Instructions       General Comments      Pertinent Vitals/ Pain       Pain Assessment Pain Assessment: Faces Faces Pain Scale: Hurts a little bit Pain Location: generalized Pain Descriptors / Indicators: Aching, Guarding Pain Intervention(s): Monitored during session  Home Living                                          Prior Functioning/Environment              Frequency  Min 2X/week        Progress Toward Goals  OT Goals(current goals can now be found in the care plan section)  Progress towards OT goals: Progressing toward goals  Acute Rehab OT Goals Patient Stated Goal: improve strength OT Goal Formulation: With patient Time For Goal Achievement: 01/23/24 Potential to Achieve Goals: Fair ADL Goals Pt Will Perform Grooming: with set-up;sitting Pt Will Perform Upper Body Bathing: with set-up;sitting Pt Will Perform Lower Body Bathing: with min assist;sit to/from stand Pt Will Transfer to Toilet: with contact guard assist;ambulating Pt Will Perform Toileting - Clothing Manipulation and hygiene: with contact guard assist;sit to/from stand  Plan       Co-evaluation                 AM-PAC OT "6 Clicks" Daily Activity     Outcome Measure   Help from another person eating meals?: A Lot Help from another person taking care of personal grooming?: A Lot Help from another person toileting, which includes using toliet, bedpan, or urinal?: Total Help from another person bathing (including washing, rinsing, drying)?: A Lot Help from another person to put on and taking off regular upper body clothing?: A Lot Help from another person to put on and taking off regular lower body clothing?: Total 6 Click Score: 10    End of Session Equipment Utilized During Treatment: Gait belt;Other (comment) Octaviano Belts)  OT Visit Diagnosis: Unsteadiness on feet (R26.81);Muscle weakness (generalized) (M62.81);Pain   Activity Tolerance Patient tolerated treatment well   Patient Left in bed;with call bell/phone within reach;with family/visitor present   Nurse Communication Mobility status  Time: 1610-9604 OT Time Calculation (min): 40 min  Charges: OT General Charges $OT Visit: 1 Visit OT Treatments $Self Care/Home Management : 23-37 mins $Therapeutic Activity: 8-22 mins  Eartha Vonbehren, OTR/L   Scherry Curtis 01/09/2024, 12:35 PM

## 2024-01-09 NOTE — Progress Notes (Signed)
 PROGRESS NOTE    DANNON NGUYENTHI  GEX:[id number] DOB: 09-11-1950 DOA: 12/05/2023 PCP: Patient, No Pcp Per    Hospital course:  73 year old male with past medical history of hypertension, gout, hyperlipidemia, hypothyroidism, sleep apnea was admitted on 4/25 for reduction and internal fixation of previous fusions involving L2-S1. Postoperative course initially seemed unremarkable but subsequently he continued to have increased pain involving the left foot seemingly not responding to as needed narcotics. Imaging of the back was unremarkable, he continued to have drain VAC in place.  Subsequently on 12/15/2023 patient had hypotension with tachycardia and fever up to 103 F.  Patient was then started on IV cefepime  and vancomycin  with concern about sepsis.  There was significant wound erythema with serosanguineous foul-smelling drainage from the surgical site.  Patient had Levophed  briefly but subsequently was weaned off    Significant hospital events 4/25 Extensive L2-S1 lumbar surgery 4/30 New onset pain  5/1 Ongoing pain with borderline blood pressure 5/2 Pain, leukocytosis fever, 5/3 Hypotension, not responding to crystalloid started on broad-spectrum antibiotics wound assessed by surgical team culture sent imaging pending ICU transfer. CT imaging of lumbar spine was negative for definitive abscess there were stable fractures at L3 and L4 with possible new fracture involving the transverse process of L2 and L3 on the left postoperative changes noted.CT angio of chest was negative for pulmonary emboli had bibasilar airspace disease the right was a little worse than the left consistent with possible pneumonia there was aortic aneurysmal dilation of the ascending aorta measuring at 4.5 cm 5/4 Weaned off norepinephrine , Blood cultures from 1 out of 2 samples growing gram-negative rods with BC ID showing both proteus and Enterobacterales, MRSA PCR negative. Narrowed to ceftriaxone . 5/5 Remains on low dose  NE. Febrile. BCx growing proteus.  5/6 Wound Cx with proteus, klebsiella aerogenes. Abx re-broadened to cefepime . 5/8 Required low dose norepi overnight but off this AM. His wound vac was changed this AM and has been having some pain 5/9 no issues overnight, denies any acute pain this a.m. 5/14-continues to have back pain. 5/17-has been advised bedrest for back pain.   5/19-removal of wound VAC.  PT evaluation. 5/20-concerns for aspiration show speech therapy was consulted,. for modified barium swallow. 5/21-speech therapy recommend regular diet. 5/27: Hemodynamically stable, still having significant pain, family wants SNF near [place]-TOC is working on it.  Mentation improved   Subjective: Patient was seen and examined today.  Continued to have backache but it is bearable with current regimen.  Wife at bedside.  Exam: General.  Obese gentleman, in no acute distress. Pulmonary.  Lungs clear bilaterally, normal respiratory effort. CV.  Regular rate and rhythm, no JVD, rub or murmur. Abdomen.  Soft, nontender, nondistended, BS positive. CNS.  Alert and oriented .  No focal neurologic deficit. Extremities.  No edema, pulses intact and symmetrical.  Assessment & Plan:  Acute metabolic encephalopathy Mentation much improved. Gabapentin  and melatonin was discontinued Patient remains on modafinil . Will discontinue gabapentin  entirely.  Constipation.  Improved today, had a bowel movement -Continue with bowel regimen as patient is taking pain medications   Nutrition Dysphagia  Evaluated by SLP who note globus, continue regular diet  Will request Dietary consult for evaluation of nutritional status  Albumin  2.0  Normocytic Anemia Anemia of Chronic Disease  H/H has been drifting down since transfusion for post op blood loss 5/17 It was drifting down to 7.1 but has increased today to 8 without transfusion  Patient is hemodynamically stable  Anemia  labs c/w ACD  S/p redo L3-4  Laminectomy 4/25 Sepsis sec to post op wound infection with Klebsiella and Proteus 5/3 Proteus bacteremia 5/3 Acute and chronic fractures L2-L5 without falls  Osteoporosis/long term steroid use  Ongoing back pain  Per NS note, unclear how patient continues to develop new fx w/o fall.mobilization On Cefepime  and Daptomycin  for 4 weeks, on 01/19/24  start oral levofloxacin  and linezolid   Continue Naprosyn, 250 bid,  OxyContin  10 every 12  Gabapentin  decreased to at bedtime as noted above with less somnolence  Continue calcitonin, got one dose of Zoledronic acid 5/20  DM 2 Hypoglycemia Blood sugars are under good control at present, no evidence of hypoglycemia Continue present management   HTN Blood pressure is normal off midrodrine Midrodrine discontinued 5/25   Paroxysmal A-fib  Chads 2 score 3 Presently in NSR, afib possibly sec to infection Echo EF 60% NWMA, mod concentril LVH Anticoagulation decision deferred to primary NS team    BPH -Continue Flomax , finasteride .   GERD Continue PPI.   Hypothyroidism Continue Synthroid .     DVT prophylaxis: Lovenox  Code Status: Full Family Communication: Discussed with wife at bedside Disposition: SNF     Pressure Injury 12/16/23 Buttocks Stage 2 -  Partial thickness loss of dermis presenting as a shallow open injury with a red, pink wound bed without slough. (Active)  12/16/23 1300  Location: Buttocks  Location Orientation:   Staging: Stage 2 -  Partial thickness loss of dermis presenting as a shallow open injury with a red, pink wound bed without slough.  Wound Description (Comments):   Present on Admission:      Pressure Injury 12/16/23 Toe (Comment  which one) Anterior;Left Deep Tissue Pressure Injury - Purple or maroon localized area of discolored intact skin or blood-filled blister due to damage of underlying soft tissue from pressure and/or shear. (Active)  12/16/23 1300  Location: Toe (Comment  which one)  Location  Orientation: Anterior;Left  Staging: Deep Tissue Pressure Injury - Purple or maroon localized area of discolored intact skin or blood-filled blister due to damage of underlying soft tissue from pressure and/or shear.  Wound Description (Comments):   Present on Admission:      Pressure Injury 12/20/23 Heel Right Deep Tissue Pressure Injury - Purple or maroon localized area of discolored intact skin or blood-filled blister due to damage of underlying soft tissue from pressure and/or shear. black (Active)  12/20/23 2000  Location: Heel  Location Orientation: Right  Staging: Deep Tissue Pressure Injury - Purple or maroon localized area of discolored intact skin or blood-filled blister due to damage of underlying soft tissue from pressure and/or shear.  Wound Description (Comments): black  Present on Admission: No     Diet Orders (From admission, onward)     Start     Ordered   01/03/24 1056  Diet regular Room service appropriate? Yes with Assist; Fluid consistency: Thin  Diet effective now       Comments: Pills whole or crushed with puree (no pills with liquid regardless of size)  Question Answer Comment  Room service appropriate? Yes with Assist   Fluid consistency: Thin      01/03/24 1055            Objective: Vitals:   01/09/24 0026 01/09/24 0553 01/09/24 0600 01/09/24 0718  BP: (!) 145/61 (!) 140/68 110/60 (!) 126/59  Pulse: 96 94  91  Resp: 19 19    Temp: (!) 97.5 F (36.4 C) 97.6 F (36.4 C)  98.5 F (36.9 C)  TempSrc: Oral Oral  Oral  SpO2: 95% 97%  97%  Weight:      Height:        Intake/Output Summary (Last 24 hours) at 01/09/2024 1519 Last data filed at 01/09/2024 1009 Gross per 24 hour  Intake 236 ml  Output 225 ml  Net 11 ml   Filed Weights   12/20/23 2106 12/25/23 0500 12/25/23 1316  Weight: 92.6 kg 92.1 kg 92.1 kg    Scheduled Meds:  (feeding supplement) PROSource Plus  30 mL Oral BID BM   [START ON 01/10/2024] vitamin C   1,000 mg Oral Daily    calcitonin (salmon)  1 spray Alternating Nares Daily   [START ON 01/10/2024] calcium  carbonate  1 tablet Oral Q breakfast   Chlorhexidine  Gluconate Cloth  6 each Topical Daily   [START ON 01/10/2024] cholecalciferol   1,000 Units Oral Daily   cyanocobalamin   1,000 mcg Oral Daily   enoxaparin  (LOVENOX ) injection  40 mg Subcutaneous Daily   feeding supplement  237 mL Oral BID BM   [START ON 01/10/2024] ferrous sulfate   325 mg Oral Q breakfast   finasteride   5 mg Oral Daily   folic acid   1 mg Oral Daily   insulin  aspart  0-9 Units Subcutaneous Q6H   levothyroxine   112 mcg Oral Q0600   loratadine   10 mg Oral Daily   magnesium  oxide  400 mg Oral BID   modafinil   200 mg Oral Daily   multivitamin with minerals  1 tablet Oral Daily   naproxen   250 mg Oral BID WC   nystatin    Topical TID   oxyCODONE   10 mg Oral Q12H   pantoprazole   40 mg Oral Daily   polyethylene glycol  17 g Oral Daily   predniSONE   5 mg Oral Q breakfast   Followed by   [name] ON 01/10/2024] predniSONE   2.5 mg Oral Q breakfast   rosuvastatin   5 mg Oral Daily   senna  2 tablet Oral BID   sodium chloride  flush  3 mL Intravenous Q12H   thiamine   100 mg Oral Daily   [START ON 01/10/2024] zinc  sulfate (50mg  elemental zinc )  220 mg Oral Daily   Continuous Infusions:  ceFEPime  (MAXIPIME ) IV 2 g (01/09/24 0900)   DAPTOmycin  700 mg (01/09/24 1315)    Nutritional status Signs/Symptoms: severe fat depletion, severe muscle depletion, energy intake < or equal to 75% for > or equal to 1 month, percent weight loss (24 lbs weight loss, 11% in 2 months) Percent weight loss: 11 % Interventions: Refer to RD note for recommendations Body mass index is 27.54 kg/m.  Data Reviewed:   CBC: Recent Labs  Lab 01/03/24 0241 01/04/24 0157 01/05/24 0205 01/06/24 0840 01/08/24 0458  WBC 8.4 9.5 8.3 7.8 9.7  HGB 8.0* 7.5* 7.2* 7.1* 8.2*  HCT 25.6* 24.4* 23.3* 22.8* 26.9*  MCV 97.7 96.4 95.9 97.0 95.1  PLT 225 185 161 139* 170   Basic  Metabolic Panel: Recent Labs  Lab 01/03/24 0241 01/04/24 0157 01/05/24 0205 01/06/24 0840 01/08/24 0458  NA 138 140 140 139 141  K 3.9 3.5 3.6 3.7 3.5  CL 105 107 105 107 108  CO2 20* 22 23 21* 19*  GLUCOSE 72 96 108* 87 99  BUN 18 18 19 19 17   CREATININE 0.84 0.75 0.61 0.73 0.87  CALCIUM  8.3* 8.2* 8.0* 7.9* 7.9*  MG  --  1.9 1.8  --  2.1   GFR: Estimated Creatinine  Clearance: 81.8 mL/min (by C-G formula based on SCr of 0.87 mg/dL). Liver Function Tests: Recent Labs  Lab 01/06/24 0840 01/08/24 0458  AST 22 22  ALT 18 18  ALKPHOS 87 96  BILITOT 0.9 1.2  PROT 5.6* 6.4*  ALBUMIN  2.0* 2.0*   No results for input(s): "LIPASE", "AMYLASE" in the last 168 hours. No results for input(s): "AMMONIA" in the last 168 hours. Coagulation Profile: No results for input(s): "INR", "PROTIME" in the last 168 hours. Cardiac Enzymes: Recent Labs  Lab 01/04/24 0157  CKTOTAL 108   BNP (last 3 results) No results for input(s): "PROBNP" in the last 8760 hours. HbA1C: No results for input(s): "HGBA1C" in the last 72 hours. CBG: Recent Labs  Lab 01/08/24 2106 01/09/24 0013 01/09/24 0556 01/09/24 0720 01/09/24 1234  GLUCAP 98 90 85 89 95   Lipid Profile: No results for input(s): "CHOL", "HDL", "LDLCALC", "TRIG", "CHOLHDL", "LDLDIRECT" in the last 72 hours. Thyroid  Function Tests: No results for input(s): "TSH", "T4TOTAL", "FREET4", "T3FREE", "THYROIDAB" in the last 72 hours. Anemia Panel: Recent Labs    01/07/24 1200  VITAMINB12 1,456*  FOLATE 16.8  FERRITIN 902*  TIBC 133*  IRON 11*  RETICCTPCT 1.3   Sepsis Labs: No results for input(s): "PROCALCITON", "LATICACIDVEN" in the last 168 hours.  No results found for this or any previous visit (from the past 240 hours).       Radiology Studies: No results found.   LOS: 35 days   Time spent= 45 mins  This record has been created using Conservation officer, historic buildings. Errors have been sought and corrected,but may  not always be located. Such creation errors do not reflect on the standard of care.   Luna Salinas, MD [facility]  If 7PM-7AM, please contact night-coverage  01/09/2024, 3:19 PM

## 2024-01-09 NOTE — TOC Progression Note (Signed)
 Transition of Care [facility]) - Progression Note    Patient Details  Name: Norman Lambert MRN: [id number] Date of Birth: March 01, 1951  Transition of Care [facility]) CM/SW Contact  Arelia Volpe A [place], LCSW Phone Number: 01/09/2024, 1:12 PM  Clinical Narrative:     CSW sent the following referrals for placement.   [facility]- waiting to hear back regarding confirmation of referral.   [facility]-left voicemail with admissions team with contact # to reach back out to CSW.   [facility]- waiting to hear back regarding confirmation of referral.      TOC will continue to follow.  Expected Discharge Plan: Skilled Nursing Facility Barriers to Discharge: Continued Medical Work up  Expected Discharge Plan and Services In-house Referral: Clinical Social Work   Post Acute Care Choice: Home Health Living arrangements for the past 2 months: Single Family Home                                       Social Determinants of Health (SDOH) Interventions SDOH Screenings   Food Insecurity: No Food Insecurity (12/06/2023)  Housing: Low Risk  (12/06/2023)  Transportation Needs: No Transportation Needs (12/06/2023)  Utilities: Not At Risk (12/06/2023)  Financial Resource Strain: Low Risk  (06/05/2023)   Received from [facility]  Physical Activity: Unknown (06/05/2023)   [facility]  Social Connections: Moderately Isolated (12/11/2023)  Stress: No Stress Concern Present (08/21/2023)   Received from [facility]  Tobacco Use: Medium Risk (12/25/2023)    Readmission Risk Interventions     No data to display

## 2024-01-09 NOTE — Progress Notes (Signed)
 Patient ID: Norman Lambert, male   DOB: 1951-04-27, 73 y.o.   MRN: [id number] Less encephalopathic today, no chin mvmts, wound ok. PT/OT trying to mobilize him as tolerated. I spoke with his wife at length

## 2024-01-09 NOTE — Progress Notes (Signed)
 Per chart review no A1c on the chart and did not found any history of DM type II.  RN notified overnight that for last several days patient did not require any insulin  given blood glucose within normal range also patient is on regular diet. Discontinuing IV subcu insulin  however continue to check POC blood glucose with mealtime in case to need to start insulin  regimen again.  Jeramey Lanuza, MD [facility] 01/09/2024, 8:08 PM

## 2024-01-10 ENCOUNTER — Inpatient Hospital Stay (HOSPITAL_COMMUNITY)

## 2024-01-10 DIAGNOSIS — I480 Paroxysmal atrial fibrillation: Secondary | ICD-10-CM | POA: Diagnosis not present

## 2024-01-10 DIAGNOSIS — D62 Acute posthemorrhagic anemia: Secondary | ICD-10-CM | POA: Diagnosis not present

## 2024-01-10 DIAGNOSIS — G9341 Metabolic encephalopathy: Secondary | ICD-10-CM | POA: Diagnosis not present

## 2024-01-10 DIAGNOSIS — R7881 Bacteremia: Secondary | ICD-10-CM | POA: Diagnosis not present

## 2024-01-10 LAB — GLUCOSE, CAPILLARY
Glucose-Capillary: 89 mg/dL (ref 70–99)
Glucose-Capillary: 102 mg/dL — ABNORMAL HIGH (ref 70–99)
Glucose-Capillary: 101 mg/dL — ABNORMAL HIGH (ref 70–99)
Glucose-Capillary: 123 mg/dL — ABNORMAL HIGH (ref 70–99)

## 2024-01-10 NOTE — Progress Notes (Signed)
 Progress Note   Patient: Norman Lambert:[id number] DOB: 10-Feb-1951 DOA: 12/05/2023     36 DOS: the patient was seen and examined on 01/10/2024   Brief hospital course:  73 year old male with past medical history of hypertension, gout, hyperlipidemia, hypothyroidism, sleep apnea was admitted on 4/25 for reduction and internal fixation of previous fusions involving L2-S1. Postoperative course initially seemed unremarkable but subsequently he continued to have increased pain involving the left foot seemingly not responding to as needed narcotics. Imaging of the back was unremarkable, he continued to have drain VAC in place.  Subsequently on 12/15/2023 patient had hypotension with tachycardia and fever up to 103 F.  Patient was then started on IV cefepime  and vancomycin  with concern about sepsis.  There was significant wound erythema with serosanguineous foul-smelling drainage from the surgical site.  Patient had Levophed  briefly but subsequently was weaned off    Significant hospital events 4/25 Extensive L2-S1 lumbar surgery 4/30 New onset pain  5/1 Ongoing pain with borderline blood pressure 5/2 Pain, leukocytosis fever, 5/3 Hypotension, not responding to crystalloid started on broad-spectrum antibiotics wound assessed by surgical team culture sent imaging pending ICU transfer. CT imaging of lumbar spine was negative for definitive abscess there were stable fractures at L3 and L4 with possible new fracture involving the transverse process of L2 and L3 on the left postoperative changes noted.CT angio of chest was negative for pulmonary emboli had bibasilar airspace disease the right was a little worse than the left consistent with possible pneumonia there was aortic aneurysmal dilation of the ascending aorta measuring at 4.5 cm 5/4 Weaned off norepinephrine , Blood cultures from 1 out of 2 samples growing gram-negative rods with BC ID showing both proteus and Enterobacterales, MRSA PCR negative.  Narrowed to ceftriaxone . 5/5 Remains on low dose NE. Febrile. BCx growing proteus.  5/6 Wound Cx with proteus, klebsiella aerogenes. Abx re-broadened to cefepime . 5/8 Required low dose norepi overnight but off this AM. His wound vac was changed this AM and has been having some pain 5/9 no issues overnight, denies any acute pain this a.m. 5/14-continues to have back pain. 5/17-has been advised bedrest for back pain.   5/19-removal of wound VAC.  PT evaluation. 5/20-concerns for aspiration show speech therapy was consulted,. for modified barium swallow. 5/21-speech therapy recommend regular diet. 5/27: Hemodynamically stable, still having significant pain, family wants SNF near [place]-TOC is working on it.  Mentation improved  Assessment and Plan:  Acute metabolic encephalopathy - Appears to be resolved at this time.  Gabapentin /melatonin discontinued.  Sepsis secondary to postop wound infection - 5/3 noted bacteremia with Klebsiella and Proteus.  Antibiotic regimen followed closely by infectious disease.  Continues on cefepime  and daptomycin  for 4 weeks, transition to oral Levaquin  and linezolid  6/6.    L3-L4 laminectomy redo/acute on chronic L2-L5 fracture/osteoporosis/back pain - Followed closely by neurosurgery.  Antibiotic therapy as above.  PT on board.  Pain medications with naproxen to 50 mg twice daily.  OxyContin  10 mg every 12 hours.  Acute normocytic anemia - Likely exacerbated by procedure/blood loss.  Currently appears to be stable.  Diabetes mellitus - Hypoglycemia resolved.  Insulin  sliding scale on board.  Dysphagia - Evaluated by SLP.  Regular diet on board.  Dietitian following closely.  Paroxysmal atrial fibrillation - CHA2DS2-VASc score 3.  Likely exacerbated by infection.  Currently normal sinus rhythm.  Anticoagulation/antiplatelets deferred to neurosurgery team.  GERD/BPH/hypothyroidism - Continue regimen  Hypertension - Off midodrine .   Subjective:  Patient resting comfortably  this morning.  Denies any shortness of breath, fever, chills, chest pain, nausea, vomiting, abdominal pain.  Was able to work with PT yesterday  Physical Exam: Vitals:   01/09/24 1708 01/09/24 1948 01/10/24 0459 01/10/24 0741  BP: (!) 118/56 (!) 110/54 (!) 116/49 (!) 117/59  Pulse: 92 85 86 86  Resp:  16 16 16   Temp: 98.9 F (37.2 C) 98 F (36.7 C) 97.9 F (36.6 C) 98.9 F (37.2 C)  TempSrc: Oral     SpO2: 97% 96% 96% 96%  Weight:      Height:        GENERAL:  Alert, pleasant, no acute distress  HEENT:  EOMI CARDIOVASCULAR:  RRR, no murmurs appreciated RESPIRATORY:  Clear to auscultation, no wheezing, rales, or rhonchi GASTROINTESTINAL:  Soft, nontender, nondistended EXTREMITIES:  No LE edema bilaterally NEURO:  No new focal deficits appreciated SKIN:  No rashes noted PSYCH:  Appropriate mood and affect     Data Reviewed:  No new imaging to review  Previous records (including but not limited to H&P, progress notes, nursing notes, TOC management) were reviewed in assessment of this patient.  Labs: CBC: Recent Labs  Lab 01/04/24 0157 01/05/24 0205 01/06/24 0840 01/08/24 0458  WBC 9.5 8.3 7.8 9.7  HGB 7.5* 7.2* 7.1* 8.2*  HCT 24.4* 23.3* 22.8* 26.9*  MCV 96.4 95.9 97.0 95.1  PLT 185 161 139* 170   Basic Metabolic Panel: Recent Labs  Lab 01/04/24 0157 01/05/24 0205 01/06/24 0840 01/08/24 0458  NA 140 140 139 141  K 3.5 3.6 3.7 3.5  CL 107 105 107 108  CO2 22 23 21* 19*  GLUCOSE 96 108* 87 99  BUN 18 19 19 17   CREATININE 0.75 0.61 0.73 0.87  CALCIUM  8.2* 8.0* 7.9* 7.9*  MG 1.9 1.8  --  2.1   Liver Function Tests: Recent Labs  Lab 01/06/24 0840 01/08/24 0458  AST 22 22  ALT 18 18  ALKPHOS 87 96  BILITOT 0.9 1.2  PROT 5.6* 6.4*  ALBUMIN  2.0* 2.0*   CBG: Recent Labs  Lab 01/09/24 0720 01/09/24 1234 01/09/24 1710 01/09/24 1947 01/10/24 0739  GLUCAP 89 95 99 117* 89    Scheduled Meds:  (feeding supplement)  PROSource Plus  30 mL Oral BID BM   vitamin C   1,000 mg Oral Daily   calcitonin (salmon)  1 spray Alternating Nares Daily   calcium  carbonate  1 tablet Oral Q breakfast   Chlorhexidine  Gluconate Cloth  6 each Topical Daily   cholecalciferol  1,000 Units Oral Daily   cyanocobalamin   1,000 mcg Oral Daily   enoxaparin  (LOVENOX ) injection  40 mg Subcutaneous Daily   feeding supplement  237 mL Oral BID BM   ferrous sulfate  325 mg Oral Q breakfast   finasteride   5 mg Oral Daily   folic acid   1 mg Oral Daily   levothyroxine   112 mcg Oral Q0600   loratadine   10 mg Oral Daily   magnesium  oxide  400 mg Oral BID   modafinil   200 mg Oral Daily   multivitamin with minerals  1 tablet Oral Daily   naproxen  250 mg Oral BID WC   nystatin   Topical TID   oxyCODONE   10 mg Oral Q12H   pantoprazole   40 mg Oral Daily   polyethylene glycol  17 g Oral Daily   predniSONE   2.5 mg Oral Q breakfast   rosuvastatin   5 mg Oral Daily   senna  2 tablet  Oral BID   sodium chloride  flush  3 mL Intravenous Q12H   thiamine  100 mg Oral Daily   zinc  sulfate (50mg  elemental zinc )  220 mg Oral Daily   Continuous Infusions:  ceFEPime  (MAXIPIME ) IV 2 g (01/10/24 0824)   DAPTOmycin  700 mg (01/09/24 1315)   PRN Meds:.acetaminophen , bisacodyl , dextrose , HYDROmorphone  (DILAUDID ) injection, ipratropium-albuterol , melatonin, menthol -cetylpyridinium **OR** phenol, ondansetron  **OR** ondansetron  (ZOFRAN ) IV, sodium chloride  flush, sodium chloride  flush, sodium chloride  flush  Family Communication: Family at bedside  Disposition: Status is: Inpatient Remains inpatient appropriate because: Sepsis     Time spent: 35 minutes  Length of stay: 36 days  Author: Jodeane Mulligan, DO 01/10/2024 12:09 PM  For on call review [email or web address].

## 2024-01-10 NOTE — Plan of Care (Signed)

## 2024-01-10 NOTE — TOC Progression Note (Signed)
 Transition of Care [facility]) - Progression Note    Patient Details  Name: Norman Lambert MRN: [id number] Date of Birth: 1951/03/19  Transition of Care [facility]) CM/SW Contact  Theodore Virgin A [place], LCSW Phone Number: 01/10/2024, 12:28 PM  Clinical Narrative:     CSW spoke with the following facilities regarding referrals.  [facility]- no beds available  [facility]- no beds available  [facility]- left vm with admissions  [address]- spoke with Rob in admissions, [phone], offered bed.   CSW met with pt and wife, [name] and daughter at bedside to discuss updates on referrals and discuss choice for rehab placement. Wife stated that they did not want [facility] for placement. They wanted to reach out to [facility] regarding bed offer and speak with Rob in admissions to assist with choice. She also requested CSW send referral to [facility]. CSW followed up with Murlean Armour at [facility] care and sent secure referral via email, [email or web address]. CSW to reach back out regarding decision for bed offer.   CSW will follow up with pt and family regarding decision on bed choice and updates on placement when able.    TOC will continue to follow.     Expected Discharge Plan: Skilled Nursing Facility Barriers to Discharge: Continued Medical Work up  Expected Discharge Plan and Services In-house Referral: Clinical Social Work   Post Acute Care Choice: Home Health Living arrangements for the past 2 months: Single Family Home                                       Social Determinants of Health (SDOH) Interventions SDOH Screenings   Food Insecurity: No Food Insecurity (12/06/2023)  Housing: Low Risk  (12/06/2023)  Transportation Needs: No Transportation Needs (12/06/2023)  Utilities: Not At Risk (12/06/2023)  Financial Resource Strain: Low Risk  (06/05/2023)   Received from [facility]  Physical Activity: Unknown (06/05/2023)   Received from [facility]  Social Connections: Moderately Isolated (12/11/2023)  Stress: No Stress Concern Present (08/21/2023)   Received from [name]  Tobacco Use: Medium Risk (12/25/2023)    Readmission Risk Interventions     No data to display

## 2024-01-10 NOTE — Hospital Course (Addendum)
 73 year old male with past medical history of hypertension, gout, hyperlipidemia, hypothyroidism, sleep apnea was admitted on 4/25 for reduction and internal fixation of previous fusions involving L2-S1. Postoperative course initially seemed unremarkable but subsequently he continued to have increased pain involving the left foot seemingly not responding to as needed narcotics. Imaging of the back was unremarkable, he continued to have drain VAC in place.  Subsequently on 12/15/2023 patient had hypotension with tachycardia and fever up to 103 F.  Patient was then started on IV cefepime  and vancomycin  with concern about sepsis.  There was significant wound erythema with serosanguineous foul-smelling drainage from the surgical site.  Patient had Levophed  briefly but subsequently was weaned off    Significant hospital events 4/25 Extensive L2-S1 lumbar surgery 4/30 New onset pain  5/1 Ongoing pain with borderline blood pressure 5/2 Pain, leukocytosis fever, 5/3 Hypotension, not responding to crystalloid started on broad-spectrum antibiotics wound assessed by surgical team culture sent imaging pending ICU transfer. CT imaging of lumbar spine was negative for definitive abscess there were stable fractures at L3 and L4 with possible new fracture involving the transverse process of L2 and L3 on the left postoperative changes noted.CT angio of chest was negative for pulmonary emboli had bibasilar airspace disease the right was a little worse than the left consistent with possible pneumonia there was aortic aneurysmal dilation of the ascending aorta measuring at 4.5 cm 5/4 Weaned off norepinephrine , Blood cultures from 1 out of 2 samples growing gram-negative rods with BC ID showing both proteus and Enterobacterales, MRSA PCR negative. Narrowed to ceftriaxone . 5/5 Remains on low dose NE. Febrile. BCx growing proteus.  5/6 Wound Cx with proteus, klebsiella aerogenes. Abx re-broadened to cefepime . 5/8 Required low  dose norepi overnight but off this AM. His wound vac was changed this AM and has been having some pain 5/9 no issues overnight, denies any acute pain this a.m. 5/14-continues to have back pain. 5/17-has been advised bedrest for back pain.   5/19-removal of wound VAC.  PT evaluation. 5/20-concerns for aspiration show speech therapy was consulted,. for modified barium swallow. 5/21-speech therapy recommend regular diet. 5/27: Hemodynamically stable, still having significant pain, family wants SNF near [place]-TOC is working on it.  Mentation improved 5/28 - Mild aspiration with feeding again.  Made NPO 5/29 - resume diet per SLP. 5/30 - npo again 6/1 - becoming septic.  Encephalopathic, hypotensive, fever 100.5, oliguric.  Family discussion about goals of care, no ICU if it comes to it. 6/3 - Remains septic, febrile, oliguric.  No desire to transfer to ICU/progressive. Family waiting to see how conservative treatment progresses.   Assessment and Plan:   New episode of severe sepsis - On 6/1, worsening encephalopathy, oliguria, hypotension, fever of 100.5.  Likely source aspiration however unclear given patient's chronic broad-spectrum antibiotic use.  Blood cultures reordered again 6/3.  DC 6/1 NGTD.  Received multiple IV boluses.  IV Flagyl and IV azithromycin added to cefepime  plus daptomycin .  Will change daptomycinto linezolid  today.  Continue hydrocortisone  IV 50mg  q6hr.  Blood pressure appears stable for now, SBP 90-100s.  Will continue to monitor closely.   Acute hypoxic respiratory failure with concern for ARDS - Currently requiring 6L nasal cannula.  Chest x-ray 6/1 noting worsening bilateral patchiness suggestive of likely worsening aspirations or vascular congestion.  Rapid COVID/flu/RSV negative.  Rapid respiratory pathogen profile negative.  Strep pneumo urine antigen negative.  Legionella Ur antigen pending.  Abx as above.  Continue to supplement O2 as above.  Attempt to wean as  tolerated.     Acute kidney injury with oliguria/anuria and ATN - Noted oliguria beginning 5/31 evening.  Zero urine output overnight again.  Foley catheter inserted 6/1.  Likely secondary to severe sepsis.  Continue IV Bumex 2 mg every 8 hours.  IV albumin  every 6 hours.  Monitor I's and O's closely.  Will recheck BMP and mag in AM.  No indication for dialysis at this time.  Will monitor his acidemia, electrolytes, volume status very closely.   Hyperkalemia -Worsening this morning, likely secondary to above.  Ordered D50/IV insulin .  Unfortunately no viable option for ongoing K removal (no urine output, no feeding/ng tube, not pursuing dialysis).  Will recheck BMP and mag later this afternoon.     Mild hypernatremia - Continues to have mild free water deficit despite aggressive isotonic resuscitation previously.  Respiratory status stable, so will proceed with NS bolus and then D5W x 500ml.  Recheck BMP later this afternoon and in am.  Will continue to be cautious with fluid administration given anuria/oliguria.   Acute metabolic encephalopathy - Much worse this morning.  Mostly lethargic.  Likely result of sepsis.  Continue to monitor closely.   Dysphagia with aspiration - Had another aspiration event 5/28 with feeding, increased coughing, noting worsening right lower lobe patchiness.  6/1 acutely worsening hypoxia.  Likely etiology of patient's sepsis above.  Patient already on antibiotics as above.  currently NPO.  Brought up the possibility of NG tube with family today.  Would be available tomorrow 6/4 to place, which would allow for oral medication and nutrition.  Family is torn about pursuing but stated can wait until tomorrow to see his status and decide then.   Transaminitis -likely secondary to sepsis or perhaps medication induced from renal insufficiency.  Will recheck LFTs in am.     Sepsis secondary to postop wound infection - 5/3 noted bacteremia with Klebsiella and Proteus.   Antibiotic regimen followed closely by infectious disease.  Original plan per ID was to receive cefepime  and daptomycin  for 4 weeks (through 6/5), transition to oral Levaquin  and linezolid  6/6 x 2 weeks.  This however is now lain by the wayside pending above.    L3-L4 laminectomy redo/acute on chronic L2-L5 fracture/osteoporosis/back pain - Followed closely by neurosurgery.  Antibiotic therapy as above.  PT on board.   Acute normocytic anemia - Hgb less than 7.0 on 6/2, likely exacerbated by aggressive volume resuscitation.  S/P 2u pRBCs.  Hgb > 8 this am.  Will transfuse if less than 7.  Will recheck CBC in AM.   Diabetes mellitus - Hypoglycemia resolved.  Insulin  sliding scale on board.     Paroxysmal atrial fibrillation - CHA2DS2-VASc score 3.  Likely exacerbated by infection.  Currently normal sinus rhythm.  Anticoagulation/antiplatelets deferred to neurosurgery team.   Goals of care - 6/3 - Patient with acute decompensation with septic appearance 6/1.  Noted worsening encephalopathy, hypotension, anuria, fever, hypoxia.  With family, nurse, paliative care present - Had a frank discussion about the patient's status, prognosis, goals of care with the patient's wife and multiple family members at bedside.  At this time, as the the patient is not requiring vasopressor medications nor needing intubation.  However, his rising potassium is giving concern about worsening decline and risk of arrhythmia.  Consensus confirmed that pt is DNR.  the family is willing to continue current conservative medical treatment and an effort to have the patient bounce back.  Conerning an NG  tube, this could be placed tomorrow.  Pt's wife was hesitant, but mentioned that we could reevaluate this decision tomorrow after an updated status.  NG tube would not be permanent.  However should the patient continue to decline, warrant vasopressor medications, intubation, or dialysis, would likely be appropriate to pursue comfort  care.  Patient's wife had stated that she would not want him to suffer.  Would likely pursue comfort care should he decompensate to that degree given his worsening functional status, quality of life.     Family very appreciative of the discussion.  Will continue to keep them up to date on status changes.

## 2024-01-10 NOTE — Care Management Important Message (Signed)
 Important Message  Patient Details  Name: Norman Lambert MRN: [id number] Date of Birth: 1951-03-05   Important Message Given:  Yes - Medicare IM     Wynonia Hedges 01/10/2024, 4:53 PM

## 2024-01-11 ENCOUNTER — Other Ambulatory Visit (HOSPITAL_COMMUNITY): Payer: Self-pay

## 2024-01-11 DIAGNOSIS — E43 Unspecified severe protein-calorie malnutrition: Secondary | ICD-10-CM | POA: Diagnosis not present

## 2024-01-11 DIAGNOSIS — R627 Adult failure to thrive: Secondary | ICD-10-CM | POA: Diagnosis not present

## 2024-01-11 DIAGNOSIS — Z515 Encounter for palliative care: Secondary | ICD-10-CM | POA: Diagnosis not present

## 2024-01-11 DIAGNOSIS — R7881 Bacteremia: Secondary | ICD-10-CM | POA: Diagnosis not present

## 2024-01-11 DIAGNOSIS — D62 Acute posthemorrhagic anemia: Secondary | ICD-10-CM | POA: Diagnosis not present

## 2024-01-11 DIAGNOSIS — Z981 Arthrodesis status: Secondary | ICD-10-CM | POA: Diagnosis not present

## 2024-01-11 DIAGNOSIS — Z7189 Other specified counseling: Secondary | ICD-10-CM | POA: Diagnosis not present

## 2024-01-11 LAB — BASIC METABOLIC PANEL WITH GFR
Anion gap: 8 (ref 5–15)
Anion gap: 8 (ref 5–15)
BUN: 25 mg/dL — ABNORMAL HIGH (ref 8–23)
BUN: 25 mg/dL — ABNORMAL HIGH (ref 8–23)
CO2: 21 mmol/L — ABNORMAL LOW (ref 22–32)
CO2: 22 mmol/L (ref 22–32)
Calcium: 7.5 mg/dL — ABNORMAL LOW (ref 8.9–10.3)
Calcium: 7.6 mg/dL — ABNORMAL LOW (ref 8.9–10.3)
Chloride: 116 mmol/L — ABNORMAL HIGH (ref 98–111)
Chloride: 113 mmol/L — ABNORMAL HIGH (ref 98–111)
Creatinine, Ser: 0.78 mg/dL (ref 0.61–1.24)
Creatinine, Ser: 0.91 mg/dL (ref 0.61–1.24)
GFR, Estimated: >60 mL/min (ref >60)
GFR, Estimated: >60 mL/min (ref >60)
Glucose, Bld: 87 mg/dL (ref 70–99)
Glucose, Bld: 108 mg/dL — ABNORMAL HIGH (ref 70–99)
Potassium: 3 mmol/L — ABNORMAL LOW (ref 3.5–5.1)
Potassium: 3.4 mmol/L — ABNORMAL LOW (ref 3.5–5.1)
Sodium: 145 mmol/L (ref 135–145)
Sodium: 143 mmol/L (ref 135–145)

## 2024-01-11 LAB — MAGNESIUM
Magnesium: 2.3 mg/dL (ref 1.7–2.4)
Magnesium: 2.1 mg/dL (ref 1.7–2.4)

## 2024-01-11 LAB — GLUCOSE, CAPILLARY
Glucose-Capillary: 88 mg/dL (ref 70–99)
Glucose-Capillary: 104 mg/dL — ABNORMAL HIGH (ref 70–99)
Glucose-Capillary: 108 mg/dL — ABNORMAL HIGH (ref 70–99)
Glucose-Capillary: 101 mg/dL — ABNORMAL HIGH (ref 70–99)

## 2024-01-11 LAB — CBC
HCT: 23.7 % — ABNORMAL LOW (ref 39.0–52.0)
Hemoglobin: 7.2 g/dL — ABNORMAL LOW (ref 13.0–17.0)
MCH: 29.4 pg (ref 26.0–34.0)
MCHC: 30.4 g/dL (ref 30.0–36.0)
MCV: 96.7 fL (ref 80.0–100.0)
Platelets: 197 10*3/uL (ref 150–400)
RBC: 2.45 MIL/uL — ABNORMAL LOW (ref 4.22–5.81)
RDW: 14.7 % (ref 11.5–15.5)
WBC: 8.5 10*3/uL (ref 4.0–10.5)
nRBC: 0 % (ref 0.0–0.2)

## 2024-01-11 LAB — CK: Total CK: 20 U/L — ABNORMAL LOW (ref 49–397)

## 2024-01-11 MED ORDER — OXYCODONE HCL 5 MG PO TABS
5.0000 mg | ORAL_TABLET | Freq: Four times a day (QID) | ORAL | Status: DC
  Administered 2024-01-11 – 2024-01-12 (×4): 5 mg via ORAL
  Filled 2024-01-11 (×5): qty 1

## 2024-01-11 MED ORDER — POTASSIUM CHLORIDE 20 MEQ PO PACK: 40.0000 meq | PACK | Freq: Two times a day (BID) | ORAL | Status: DC

## 2024-01-11 MED ORDER — FUROSEMIDE 10 MG/ML IJ SOLN
40.0000 mg | Freq: Once | INTRAMUSCULAR | Status: AC
  Administered 2024-01-11: 40 mg via INTRAVENOUS
  Filled 2024-01-11: qty 4

## 2024-01-11 MED ORDER — POTASSIUM CHLORIDE 10 MEQ/100ML IV SOLN
10.0000 meq | INTRAVENOUS | Status: AC
  Administered 2024-01-11 (×4): 10 meq via INTRAVENOUS
  Filled 2024-01-11 (×4): qty 100

## 2024-01-11 NOTE — Progress Notes (Signed)
 Palliative:  ***  Yong Channel, NP Palliative Medicine Team Pager [phone] (Please see [email or web address] for schedule) Team Phone [phone]

## 2024-01-11 NOTE — Plan of Care (Signed)

## 2024-01-11 NOTE — TOC Progression Note (Addendum)
 Transition of Care [facility]) - Progression Note    Patient Details  Name: Norman Lambert MRN: [id number] Date of Birth: [birth year]  Transition of Care [facility]) CM/SW Contact  Amyjo Mizrachi A [place], LCSW Phone Number: 01/11/2024, 10:19 AM  Clinical Narrative:     CSW met with pt and [name] at bedside. Provided updated bed offer to [facility] [place]. [name] said that their daughter would be contacting and visiting [facility] care and will let me know once they have decided on facility. CSW to start insurance authorization as pt is near medical stability and facility is confirmed.    TOC will continue to follow.   Expected Discharge Plan: Skilled Nursing Facility Barriers to Discharge: Continued Medical Work up  Expected Discharge Plan and Services In-house Referral: Clinical Social Work   Post Acute Care Choice: Home Health Living arrangements for the past 2 months: Single Family Home                                       Social Determinants of Health (SDOH) Interventions SDOH Screenings   Food Insecurity: No Food Insecurity (12/06/2023)  Housing: Low Risk  (12/06/2023)  Transportation Needs: No Transportation Needs (12/06/2023)  Utilities: Not At Risk (12/06/2023)  Financial Resource Strain: Low Risk  (06/05/2023)   Received from [facility]  Physical Activity: Unknown (06/05/2023)   Received from [facility]  Social Connections: Moderately Isolated (12/11/2023)  Stress: No Stress Concern Present (08/21/2023)   Received from [facility]  Tobacco Use: Medium Risk (12/25/2023)    Readmission Risk Interventions     No data to display

## 2024-01-11 NOTE — Progress Notes (Signed)
 Physical Therapy Treatment Patient Details Name: Norman Lambert MRN: [id number] DOB: Sep 30, 1950 Today's Date: 01/11/2024   History of Present Illness 73 yo male admitted 12/05/23 with LB and severe back pain. 12/08/23 redo of laminectomy L3-4 with posterior fixation L2-S1; recovery complicated by pain and hypotension; 5/2 hypotension and tachycardia. 5/3 transfers to ICU, pressors, L2-3 TVP fx. 5/5-51/19 VAC placed. 5/19 P.Anselmo Bast. PMHx: gout, arthritis, HTN, HLD, HTN, PVD, RBBB, Rt reverse TSA, sleep apnea, 11/13/23 PLIF L3-4.    PT Comments  Pt received in supine, alert and oriented to self/situation/month and year, with improved alertness today but more limited with EOB transfer by c/o pain. Pt guarding and not agreeable to seated BLE exercises for strengthening or transfer trials due to c/o pain. Pt tolerates sitting EOB ~3 mins prior to insisting on return to supine. Of note, pt not able to indicate dizziness while seated but possibly had some orthostatic hypotension as BP was low 109/51 HR 90 bpm after return to supine from sitting EOB. Pt did not sit long enough for seated BP to be taken. Patient will benefit from continued inpatient follow up therapy, <3 hours/day.     If plan is discharge home, recommend the following: Two people to help with walking and/or transfers;Two people to help with bathing/dressing/bathroom;Supervision due to cognitive status;Assistance with cooking/housework   Can travel by private vehicle     No  Equipment Recommendations  Wheelchair (measurements PT);Hoyer lift;Hospital bed;Wheelchair cushion (measurements PT)    Recommendations for Other Services       Precautions / Restrictions Precautions Precautions: Back;Fall;Other (comment) Precaution Booklet Issued: Yes (comment) Recall of Precautions/Restrictions: Impaired Precaution/Restrictions Comments: pt not stating any precautions this date, total assist to don brace Required Braces or Orthoses: Spinal  Brace Spinal Brace: Lumbar corset;Applied in sitting position Other Brace: AFO for LLE- not donned this session Restrictions Weight Bearing Restrictions Per Provider Order: No     Mobility  Bed Mobility Overal bed mobility: Needs Assistance Bed Mobility: Rolling, Sidelying to Sit, Sit to Sidelying Rolling: Mod assist, Used rails Sidelying to sit: Max assist, +2 for physical assistance, HOB elevated, Used rails     Sit to sidelying: Max assist, +2 for physical assistance, Used rails General bed mobility comments: max A x2 in/out of bed to R EOB, max cueing. R lean sitting EOB.    Transfers Overall transfer level: Needs assistance                 General transfer comment: pt adamantly refusing and does not allow for brace to be donned; impulsive to return to supine after sitting EOB ~3 mins    Ambulation/Gait               General Gait Details: unable to attempt this date due to pain/guarding   Stairs             Wheelchair Mobility     Tilt Bed    Modified Rankin (Stroke Patients Only)       Balance Overall balance assessment: Needs assistance Sitting-balance support: Single extremity supported, Feet supported Sitting balance-Leahy Scale: Poor Sitting balance - Comments: pt with preference for UB support but able to maintain sitting for short periods with hands on thighs but impulsive to prop to R elbow due to c/o back pain       Standing balance comment: pt defers standing trials due to back pain  Communication Communication Communication: Impaired Factors Affecting Communication: Hearing impaired;Difficulty expressing self;Reduced clarity of speech  Cognition Arousal: Alert Behavior During Therapy: Flat affect, Impulsive, Anxious   PT - Cognitive impairments: Memory, Sequencing, Problem solving, Safety/Judgement                       PT - Cognition Comments: pt with limited verbalization,  but much more alert today compared with when this therapist saw him previous week. Pt able to state his DOB, correct month and year, orientation not further assessed. Pt with limited participation in activity once seated EOB due to apparent pain and significant guarding observed. Pt impulsive to try returning to supine and did not allow PTA to don his brace sitting EOB. Pt ignoring encouragement from PTA and his daughter to participate more in EOB. Following commands: Impaired Following commands impaired: Follows one step commands inconsistently, Follows one step commands with increased time    Cueing Cueing Techniques: Verbal cues, Gestural cues, Tactile cues, Visual cues  Exercises Other Exercises Other Exercises: attempted to have pt participate in seated LE exercises but he defers and guarding/does not allow for AAROM LAQ as therapist attempts; refusing STS trials also    General Comments General comments (skin integrity, edema, etc.): RN notified no soiling seen on bed pad, but did not get a good look due to pt impulsivity and PTA standing in front of him during session. Daughter present behind him and providing guarding at his shoulders for pt safety. BP 109/51 in semi-sidelying to his R after return to supine; HR 90 bpm SpO2 94% on RA.      Pertinent Vitals/Pain Pain Assessment Pain Assessment: PAINAD Breathing: occasional labored breathing, short period of hyperventilation Negative Vocalization: occasional moan/groan, low speech, negative/disapproving quality Facial Expression: facial grimacing Body Language: tense, distressed pacing, fidgeting Consolability: unable to console, distract or reassure PAINAD Score: 7 Pain Location: back with seated posture Pain Descriptors / Indicators: Aching, Guarding, Grimacing Pain Intervention(s): Limited activity within patient's tolerance, Monitored during session, Repositioned    Home Living                          Prior Function             PT Goals (current goals can now be found in the care plan section) Acute Rehab PT Goals Patient Stated Goal: To get better and walk again PT Goal Formulation: With patient/family Time For Goal Achievement: 01/15/24 Progress towards PT goals: Progressing toward goals (slowly)    Frequency    Min 2X/week      PT Plan      Co-evaluation              AM-PAC PT "6 Clicks" Mobility   Outcome Measure  Help needed turning from your back to your side while in a flat bed without using bedrails?: A Lot Help needed moving from lying on your back to sitting on the side of a flat bed without using bedrails?: Total Help needed moving to and from a bed to a chair (including a wheelchair)?: Total Help needed standing up from a chair using your arms (e.g., wheelchair or bedside chair)?: Total Help needed to walk in hospital room?: Total Help needed climbing 3-5 steps with a railing? : Total 6 Click Score: 7    End of Session Equipment Utilized During Treatment: Other (comment) (pt refusing brace and impulsive to return to sidelying) Activity Tolerance: Patient limited  by pain Patient left: in bed;with call bell/phone within reach;with bed alarm set;with nursing/sitter in room;Other (comment);with family/visitor present (NT in room taking BP; daughter in room) Nurse Communication: Mobility status;Need for lift equipment;Precautions PT Visit Diagnosis: Muscle weakness (generalized) (M62.81);Other abnormalities of gait and mobility (R26.89);Difficulty in walking, not elsewhere classified (R26.2)     Time: 1700-1715 PT Time Calculation (min) (ACUTE ONLY): 15 min  Charges:    $Therapeutic Activity: 8-22 mins PT General Charges $$ ACUTE PT VISIT: 1 Visit                     Jurnee Nakayama P., PTA Acute Rehabilitation Services Secure Chat Preferred 9a-5:30pm Office: [phone]    Mariel Shope [facility] 01/11/2024, 5:48 PM

## 2024-01-11 NOTE — Progress Notes (Signed)
 Progress Note   Patient: Norman Lambert MVH:[id number] DOB: [birth year] DOA: 12/05/2023     37 DOS: the patient was seen and examined on 01/11/2024   Brief hospital course:  73 year old male with past medical history of hypertension, gout, hyperlipidemia, hypothyroidism, sleep apnea was admitted on 4/25 for reduction and internal fixation of previous fusions involving L2-S1. Postoperative course initially seemed unremarkable but subsequently he continued to have increased pain involving the left foot seemingly not responding to as needed narcotics. Imaging of the back was unremarkable, he continued to have drain VAC in place.  Subsequently on 12/15/2023 patient had hypotension with tachycardia and fever up to 103 F.  Patient was then started on IV cefepime  and vancomycin  with concern about sepsis.  There was significant wound erythema with serosanguineous foul-smelling drainage from the surgical site.  Patient had Levophed  briefly but subsequently was weaned off    Significant hospital events 4/25 Extensive L2-S1 lumbar surgery 4/30 New onset pain  5/1 Ongoing pain with borderline blood pressure 5/2 Pain, leukocytosis fever, 5/3 Hypotension, not responding to crystalloid started on broad-spectrum antibiotics wound assessed by surgical team culture sent imaging pending ICU transfer. CT imaging of lumbar spine was negative for definitive abscess there were stable fractures at L3 and L4 with possible new fracture involving the transverse process of L2 and L3 on the left postoperative changes noted.CT angio of chest was negative for pulmonary emboli had bibasilar airspace disease the right was a little worse than the left consistent with possible pneumonia there was aortic aneurysmal dilation of the ascending aorta measuring at 4.5 cm 5/4 Weaned off norepinephrine , Blood cultures from 1 out of 2 samples growing gram-negative rods with BC ID showing both proteus and Enterobacterales, MRSA PCR negative.  Narrowed to ceftriaxone . 5/5 Remains on low dose NE. Febrile. BCx growing proteus.  5/6 Wound Cx with proteus, klebsiella aerogenes. Abx re-broadened to cefepime . 5/8 Required low dose norepi overnight but off this AM. His wound vac was changed this AM and has been having some pain 5/9 no issues overnight, denies any acute pain this a.m. 5/14-continues to have back pain. 5/17-has been advised bedrest for back pain.   5/19-removal of wound VAC.  PT evaluation. 5/20-concerns for aspiration show speech therapy was consulted,. for modified barium swallow. 5/21-speech therapy recommend regular diet. 5/27: Hemodynamically stable, still having significant pain, family wants SNF near [place]-TOC is working on it.  Mentation improved 5/28 - Mild aspiration with feeding again.  Made NPO 5/29 - resume diet per SLP.   Assessment and Plan:   Acute metabolic encephalopathy - Appears to be resolved at this time.  Gabapentin /melatonin discontinued.   Sepsis secondary to postop wound infection - 5/3 noted bacteremia with Klebsiella and Proteus.  Antibiotic regimen followed closely by infectious disease.  Continues on cefepime  and daptomycin  for 4 weeks, transition to oral Levaquin  and linezolid  6/6.     L3-L4 laminectomy redo/acute on chronic L2-L5 fracture/osteoporosis/back pain - Followed closely by neurosurgery.  Antibiotic therapy as above.  PT on board.  Pain medications with naproxen to 50 mg twice daily.  OxyContin  10 mg every 12 hours.   Acute normocytic anemia - Likely exacerbated by procedure/blood loss as well as prolonged hospital stay.  Currently appears to be mostly stable however close to transfusion threshold.  Will recheck CBC in AM.   Diabetes mellitus - Hypoglycemia resolved.  Insulin  sliding scale on board.   Dysphagia with aspiration - Had another aspiration event yesterday with feeding, increased coughing.  Stat chest x-ray ordered and personally reviewed noting worsening  right lower lobe patchiness.  No hypoxia.  Patient already on antibiotics as above.  Evaluated by SLP.  Regular diet on board, and now with nectar thick liquids.  Dietitian following closely.  Ordered IV Lasix x 1 to promote diuresis.  Continue to encourage safe practices when eating such as sitting up, small bites, purposeful chewing/swallowing.  No talking while eating.  Hypokalemia - Potassium 3.0 this morning.  Will be exacerbated by IV Lasix.  Will order 10 mEq IV x 4 with recheck BMP later this afternoon.  Will place on telemetry for the time being as well.   Paroxysmal atrial fibrillation - CHA2DS2-VASc score 3.  Likely exacerbated by infection.  Currently normal sinus rhythm.  Anticoagulation/antiplatelets deferred to neurosurgery team.   GERD/BPH/hypothyroidism - Continue regimen   Hypertension - Off midodrine .   Subjective: Patient feeling slightly improved this morning.  Yesterday had an aspiration event with worsening cough, dyspnea after a meal.  Stat chest x-ray ordered noting some worsening patchiness especially in the right lower lobe.  Continues to be an intermittent problem with the patient.  Reevaluated by swallow this morning with recommendations to resume diet with thickened liquids.  Physical Exam: Vitals:   01/10/24 1700 01/10/24 2024 01/11/24 0426 01/11/24 0733  BP: 112/68 104/64 (!) 126/53 113/79  Pulse: 98 99 90 91  Resp: 19 16 16 18   Temp: 99.5 F (37.5 C) 97.7 F (36.5 C) 97.8 F (36.6 C) (!) 97.4 F (36.3 C)  TempSrc: Oral Oral  Oral  SpO2: 92% 93% 95% 95%  Weight:      Height:        GENERAL:  Alert, pleasant, no acute distress  HEENT:  EOMI CARDIOVASCULAR:  RRR, no murmurs appreciated RESPIRATORY: Coarse cough, mild expiratory wheezing GASTROINTESTINAL:  Soft, nontender, nondistended EXTREMITIES:  No LE edema bilaterally NEURO:  No new focal deficits appreciated SKIN:  No rashes noted PSYCH:  Appropriate mood and affect    Data  Reviewed:  Chest x-ray personally reviewed noting worsening right lower lobe patchiness with overall mild congestion appreciated.  Previous records (including but not limited to H&P, progress notes, nursing notes, TOC management) were reviewed in assessment of this patient.  Labs: CBC: Recent Labs  Lab 01/05/24 0205 01/06/24 0840 01/08/24 0458 01/11/24 0405  WBC 8.3 7.8 9.7 8.5  HGB 7.2* 7.1* 8.2* 7.2*  HCT 23.3* 22.8* 26.9* 23.7*  MCV 95.9 97.0 95.1 96.7  PLT 161 139* 170 197   Basic Metabolic Panel: Recent Labs  Lab 01/05/24 0205 01/06/24 0840 01/08/24 0458 01/11/24 0405  NA 140 139 141 145  K 3.6 3.7 3.5 3.0*  CL 105 107 108 116*  CO2 23 21* 19* 21*  GLUCOSE 108* 87 99 87  BUN 19 19 17  25*  CREATININE 0.61 0.73 0.87 0.78  CALCIUM  8.0* 7.9* 7.9* 7.5*  MG 1.8  --  2.1 2.3   Liver Function Tests: Recent Labs  Lab 01/06/24 0840 01/08/24 0458  AST 22 22  ALT 18 18  ALKPHOS 87 96  BILITOT 0.9 1.2  PROT 5.6* 6.4*  ALBUMIN  2.0* 2.0*   CBG: Recent Labs  Lab 01/10/24 0739 01/10/24 1305 01/10/24 1759 01/10/24 2026 01/11/24 0735  GLUCAP 89 102* 101* 123* 88    Scheduled Meds:  (feeding supplement) PROSource Plus  30 mL Oral BID BM   vitamin C   1,000 mg Oral Daily   calcitonin (salmon)  1 spray Alternating Nares Daily  calcium  carbonate  1 tablet Oral Q breakfast   Chlorhexidine  Gluconate Cloth  6 each Topical Daily   cholecalciferol  1,000 Units Oral Daily   cyanocobalamin   1,000 mcg Oral Daily   enoxaparin  (LOVENOX ) injection  40 mg Subcutaneous Daily   feeding supplement  237 mL Oral BID BM   ferrous sulfate  325 mg Oral Q breakfast   finasteride   5 mg Oral Daily   folic acid   1 mg Oral Daily   levothyroxine   112 mcg Oral Q0600   loratadine   10 mg Oral Daily   magnesium  oxide  400 mg Oral BID   modafinil   200 mg Oral Daily   multivitamin with minerals  1 tablet Oral Daily   naproxen  250 mg Oral BID WC   nystatin   Topical TID   oxyCODONE   10  mg Oral Q12H   pantoprazole   40 mg Oral Daily   polyethylene glycol  17 g Oral Daily   predniSONE   2.5 mg Oral Q breakfast   rosuvastatin   5 mg Oral Daily   senna  2 tablet Oral BID   sodium chloride  flush  3 mL Intravenous Q12H   thiamine  100 mg Oral Daily   zinc  sulfate (50mg  elemental zinc )  220 mg Oral Daily   Continuous Infusions:  ceFEPime  (MAXIPIME ) IV 2 g (01/11/24 0833)   DAPTOmycin  700 mg (01/10/24 1425)   potassium chloride  10 mEq (01/11/24 1136)   PRN Meds:.acetaminophen , bisacodyl , dextrose , HYDROmorphone  (DILAUDID ) injection, ipratropium-albuterol , melatonin, menthol -cetylpyridinium **OR** phenol, ondansetron  **OR** ondansetron  (ZOFRAN ) IV, sodium chloride  flush, sodium chloride  flush, sodium chloride  flush  Family Communication: Wife at bedside, granddaughter via telephone/video call  Disposition: Status is: Inpatient Remains inpatient appropriate because: Dysphagia with antibiotic coverage     Time spent: 39 minutes  Length of stay: 37 days  Author: Jodeane Mulligan, DO 01/11/2024 11:50 AM  For on call review [email or web address].

## 2024-01-11 NOTE — Progress Notes (Signed)
 Speech Language Pathology Treatment: Dysphagia  Patient Details Name: Norman Lambert MRN: [id number] DOB: 04/27/1951 Today's Date: 01/11/2024 Time: 4010-2725 SLP Time Calculation (min) (ACUTE ONLY): 34 min  Assessment / Plan / Recommendation Clinical Impression  SLP called to see due to pt coughing with sips/bites and wet sounding last night and made NPO. His wife present and reports he has "been coughing with liquids for about 2 days." Mung repositioned upright and had mild cough with thin liquids and one instance of immediate cough and suspected aspiration. Liquids thickened to nectar with coughing noted but less frequent. Suspect esophageal component as pt had consistent and frequent eructation after po's. Wife reported pt had procedure done to lower part of esophagus 40 years ago but unable to state procedure (question if had a Nissen fundoplication?). Mastication was slow with regular but complete. SLP ordered regular diet, nectar thick liquids- sometimes thicker liquids may be contraindicated if there is esophageal involvement. Pt frequently requests water and initiated the water protocol where he may have thin water (only) in between meals and after oral care. Recommend sitting upright min 30 min after meals. Consider barium esophagram to further investigate esophageal function. Last session pt had multiple bouts of regurgitation (mostly water). ST will continue to follow.    HPI HPI: [name] is a 73 yo male presenting to ED 4/22 with severe lower back pain. Underwent laminectomy L3-4 with posterior fixation L2-S1. Recovery complicated by pain and hypotension and was transferred to ICU 5/3. PMH includes gout, arthritis, HTN, HLD, PVD, RBBB, R reverse TSA, sleep apnea      SLP Plan  Continue with current plan of care      Recommendations for follow up therapy are one component of a multi-disciplinary discharge planning process, led by the attending physician.  Recommendations may be  updated based on patient status, additional functional criteria and insurance authorization.    Recommendations  Diet recommendations: Regular;Nectar-thick liquid Liquids provided via: Cup;Straw Medication Administration: Crushed with puree Supervision: Staff to assist with self feeding;Full supervision/cueing for compensatory strategies Compensations: Minimize environmental distractions;Slow rate;Small sips/bites Postural Changes and/or Swallow Maneuvers: Seated upright 90 degrees;Upright 30-60 min after meal                  Oral care BID   Frequent or constant Supervision/Assistance Dysphagia, oropharyngeal phase (R13.12)     Continue with current plan of care     [name]  01/11/2024, 10:11 AM

## 2024-01-12 ENCOUNTER — Inpatient Hospital Stay (HOSPITAL_COMMUNITY)

## 2024-01-12 ENCOUNTER — Other Ambulatory Visit (HOSPITAL_COMMUNITY): Payer: Self-pay

## 2024-01-12 DIAGNOSIS — R7881 Bacteremia: Secondary | ICD-10-CM | POA: Diagnosis not present

## 2024-01-12 DIAGNOSIS — D62 Acute posthemorrhagic anemia: Secondary | ICD-10-CM | POA: Diagnosis not present

## 2024-01-12 DIAGNOSIS — G9341 Metabolic encephalopathy: Secondary | ICD-10-CM | POA: Diagnosis not present

## 2024-01-12 DIAGNOSIS — B964 Proteus (mirabilis) (morganii) as the cause of diseases classified elsewhere: Secondary | ICD-10-CM | POA: Diagnosis not present

## 2024-01-12 LAB — BASIC METABOLIC PANEL WITH GFR
Anion gap: 10 (ref 5–15)
BUN: 28 mg/dL — ABNORMAL HIGH (ref 8–23)
CO2: 19 mmol/L — ABNORMAL LOW (ref 22–32)
Calcium: 7.6 mg/dL — ABNORMAL LOW (ref 8.9–10.3)
Chloride: 114 mmol/L — ABNORMAL HIGH (ref 98–111)
Creatinine, Ser: 0.97 mg/dL (ref 0.61–1.24)
GFR, Estimated: >60 mL/min (ref >60)
Glucose, Bld: 88 mg/dL (ref 70–99)
Potassium: 3.2 mmol/L — ABNORMAL LOW (ref 3.5–5.1)
Sodium: 143 mmol/L (ref 135–145)

## 2024-01-12 LAB — CBC
HCT: 25 % — ABNORMAL LOW (ref 39.0–52.0)
Hemoglobin: 7.8 g/dL — ABNORMAL LOW (ref 13.0–17.0)
MCH: 29.4 pg (ref 26.0–34.0)
MCHC: 31.2 g/dL (ref 30.0–36.0)
MCV: 94.3 fL (ref 80.0–100.0)
Platelets: 225 10*3/uL (ref 150–400)
RBC: 2.65 MIL/uL — ABNORMAL LOW (ref 4.22–5.81)
RDW: 14.8 % (ref 11.5–15.5)
WBC: 10.1 10*3/uL (ref 4.0–10.5)
nRBC: 0 % (ref 0.0–0.2)

## 2024-01-12 LAB — GLUCOSE, CAPILLARY
Glucose-Capillary: 82 mg/dL (ref 70–99)
Glucose-Capillary: 121 mg/dL — ABNORMAL HIGH (ref 70–99)
Glucose-Capillary: 124 mg/dL — ABNORMAL HIGH (ref 70–99)
Glucose-Capillary: 103 mg/dL — ABNORMAL HIGH (ref 70–99)

## 2024-01-12 LAB — MAGNESIUM: Magnesium: 2.2 mg/dL (ref 1.7–2.4)

## 2024-01-12 MED ORDER — LORAZEPAM 0.5 MG PO TABS
0.5000 mg | ORAL_TABLET | Freq: Two times a day (BID) | ORAL | Status: DC | PRN
  Administered 2024-01-12: 0.5 mg via ORAL
  Filled 2024-01-12: qty 1

## 2024-01-12 MED ORDER — LINEZOLID 600 MG PO TABS: 600.0000 mg | ORAL_TABLET | Freq: Two times a day (BID) | ORAL | Status: DC

## 2024-01-12 MED ORDER — POTASSIUM CHLORIDE 20 MEQ PO PACK
40.0000 meq | PACK | Freq: Two times a day (BID) | ORAL | Status: AC
  Administered 2024-01-12: 40 meq via ORAL
  Filled 2024-01-12: qty 2

## 2024-01-12 MED ORDER — LEVOFLOXACIN 750 MG PO TABS: 750.0000 mg | ORAL_TABLET | Freq: Every day | ORAL | Status: DC

## 2024-01-12 MED ORDER — POTASSIUM CHLORIDE 10 MEQ/100ML IV SOLN
10.0000 meq | INTRAVENOUS | Status: AC
  Administered 2024-01-12 (×2): 10 meq via INTRAVENOUS
  Filled 2024-01-12 (×2): qty 100

## 2024-01-12 NOTE — Plan of Care (Signed)

## 2024-01-12 NOTE — Progress Notes (Signed)
 Progress Note   Patient: Norman Lambert:[id number] DOB: [birth year] DOA: 12/05/2023     38 DOS: the patient was seen and examined on 01/12/2024   Brief hospital course:  73 year old male with past medical history of hypertension, gout, hyperlipidemia, hypothyroidism, sleep apnea was admitted on 4/25 for reduction and internal fixation of previous fusions involving L2-S1. Postoperative course initially seemed unremarkable but subsequently he continued to have increased pain involving the left foot seemingly not responding to as needed narcotics. Imaging of the back was unremarkable, he continued to have drain VAC in place.  Subsequently on 12/15/2023 patient had hypotension with tachycardia and fever up to 103 F.  Patient was then started on IV cefepime  and vancomycin  with concern about sepsis.  There was significant wound erythema with serosanguineous foul-smelling drainage from the surgical site.  Patient had Levophed  briefly but subsequently was weaned off    Significant hospital events 4/25 Extensive L2-S1 lumbar surgery 4/30 New onset pain  5/1 Ongoing pain with borderline blood pressure 5/2 Pain, leukocytosis fever, 5/3 Hypotension, not responding to crystalloid started on broad-spectrum antibiotics wound assessed by surgical team culture sent imaging pending ICU transfer. CT imaging of lumbar spine was negative for definitive abscess there were stable fractures at L3 and L4 with possible new fracture involving the transverse process of L2 and L3 on the left postoperative changes noted.CT angio of chest was negative for pulmonary emboli had bibasilar airspace disease the right was a little worse than the left consistent with possible pneumonia there was aortic aneurysmal dilation of the ascending aorta measuring at 4.5 cm 5/4 Weaned off norepinephrine , Blood cultures from 1 out of 2 samples growing gram-negative rods with BC ID showing both proteus and Enterobacterales, MRSA PCR negative.  Narrowed to ceftriaxone . 5/5 Remains on low dose NE. Febrile. BCx growing proteus.  5/6 Wound Cx with proteus, klebsiella aerogenes. Abx re-broadened to cefepime . 5/8 Required low dose norepi overnight but off this AM. His wound vac was changed this AM and has been having some pain 5/9 no issues overnight, denies any acute pain this a.m. 5/14-continues to have back pain. 5/17-has been advised bedrest for back pain.   5/19-removal of wound VAC.  PT evaluation. 5/20-concerns for aspiration show speech therapy was consulted,. for modified barium swallow. 5/21-speech therapy recommend regular diet. 5/27: Hemodynamically stable, still having significant pain, family wants SNF near [place]-TOC is working on it.  Mentation improved 5/28 - Mild aspiration with feeding again.  Made NPO 5/29 - resume diet per SLP.   Assessment and Plan:   Acute metabolic encephalopathy - Appears to be resolved at this time.  Gabapentin /melatonin discontinued.   Sepsis secondary to postop wound infection - 5/3 noted bacteremia with Klebsiella and Proteus.  Antibiotic regimen followed closely by infectious disease.  Continues on cefepime  and daptomycin  for 4 weeks (through 6/5), transition to oral Levaquin  and linezolid  6/6 x 2 weeks.     L3-L4 laminectomy redo/acute on chronic L2-L5 fracture/osteoporosis/back pain - Followed closely by neurosurgery.  Antibiotic therapy as above.  PT on board.  Pain medications with naproxen to 50 mg twice daily.  OxyContin  10 mg every 12 hours transitioned to oxycodone  5mg  q6h given dysphagia (unable to crush oxycontin ).   Acute normocytic anemia - Likely exacerbated by procedure/blood loss as well as prolonged hospital stay.  Currently appears to be mostly stable.  Will recheck CBC in AM.   Diabetes mellitus - Hypoglycemia resolved.  Insulin  sliding scale on board.   Dysphagia with aspiration -  Had another aspiration event 5/28 with feeding, increased coughing.  Stat chest  x-ray ordered and personally reviewed noting worsening right lower lobe patchiness.  No hypoxia.  Patient already on antibiotics as above.  Evaluated by SLP.  Regular diet on board, and now with nectar thick liquids.  Dietitian following closely.  S/p IV Lasix x 1 to promote diuresis which worked well.  Continue to encourage safe practices when eating such as sitting up, small bites, purposeful chewing/swallowing.  No talking while eating.  Working closely with SLP, will order esophogram today.   Hypokalemia - Potassium 3.4 this morning after replenishment yesterday.  exacerbated by IV Lasix yesterday.  Will order 10 mEq IV x 2 along with 40meq PO.  Will recheck BMP in am.  Will place on telemetry for the time being as well.   Paroxysmal atrial fibrillation - CHA2DS2-VASc score 3.  Likely exacerbated by infection.  Currently normal sinus rhythm.  Anticoagulation/antiplatelets deferred to neurosurgery team.   GERD/BPH/hypothyroidism - Continue regimen   Hypertension - Off midodrine .  Goals of care - Currently working with PT, neurosurgery, TOC on disposition planning.  IV antibiotics will complete 6/5 with subsequent transition to p.o. afterwards.  Likely good timing to transition to STR.   Subjective: Patient resting comfortably this morning.  Wife at bedside.  Appears to be improving with feedings.  Still having some coughing to be expected.  Tolerated diuresis well yesterday.  Some reported dizziness yesterday.  No chest pain, shortness of breath, fever, nausea, vomiting, abdominal pain.  Physical Exam: Vitals:   01/11/24 1730 01/11/24 2005 01/12/24 0347 01/12/24 0836  BP: (!) 109/51 118/63 131/73 (!) 129/52  Pulse: 90 88 96 99  Resp: 18 16 18 18   Temp: 98.8 F (37.1 C) 98.8 F (37.1 C) 99.3 F (37.4 C) 99.3 F (37.4 C)  TempSrc: Oral Oral Oral Oral  SpO2: 94% 98% 96% 95%  Weight:      Height:        GENERAL:  Alert, pleasant, no acute distress  HEENT:  EOMI CARDIOVASCULAR:   RRR, no murmurs appreciated RESPIRATORY: Coarse cough, mild expiratory wheezing GASTROINTESTINAL:  Soft, nontender, nondistended EXTREMITIES:  No LE edema bilaterally NEURO:  No new focal deficits appreciated SKIN:  No rashes noted PSYCH:  Appropriate mood and affect    Data Reviewed:  No new imaging to review  Previous records (including but not limited to H&P, progress notes, nursing notes, TOC management) were reviewed in assessment of this patient.  Labs: CBC: Recent Labs  Lab 01/06/24 0840 01/08/24 0458 01/11/24 0405 01/12/24 0515  WBC 7.8 9.7 8.5 10.1  HGB 7.1* 8.2* 7.2* 7.8*  HCT 22.8* 26.9* 23.7* 25.0*  MCV 97.0 95.1 96.7 94.3  PLT 139* 170 197 225   Basic Metabolic Panel: Recent Labs  Lab 01/06/24 0840 01/08/24 0458 01/11/24 0405 01/11/24 1436 01/12/24 0515  NA 139 141 145 143 143  K 3.7 3.5 3.0* 3.4* 3.2*  CL 107 108 116* 113* 114*  CO2 21* 19* 21* 22 19*  GLUCOSE 87 99 87 108* 88  BUN 19 17 25* 25* 28*  CREATININE 0.73 0.87 0.78 0.91 0.97  CALCIUM  7.9* 7.9* 7.5* 7.6* 7.6*  MG  --  2.1 2.3 2.1 2.2   Liver Function Tests: Recent Labs  Lab 01/06/24 0840 01/08/24 0458  AST 22 22  ALT 18 18  ALKPHOS 87 96  BILITOT 0.9 1.2  PROT 5.6* 6.4*  ALBUMIN  2.0* 2.0*   CBG: Recent Labs  Lab 01/11/24  1610 01/11/24 1156 01/11/24 1734 01/11/24 2046 01/12/24 0837  GLUCAP 88 104* 108* 101* 82    Scheduled Meds:  (feeding supplement) PROSource Plus  30 mL Oral BID BM   vitamin C   1,000 mg Oral Daily   calcitonin (salmon)  1 spray Alternating Nares Daily   calcium  carbonate  1 tablet Oral Q breakfast   Chlorhexidine  Gluconate Cloth  6 each Topical Daily   cholecalciferol  1,000 Units Oral Daily   cyanocobalamin   1,000 mcg Oral Daily   enoxaparin  (LOVENOX ) injection  40 mg Subcutaneous Daily   feeding supplement  237 mL Oral BID BM   ferrous sulfate  325 mg Oral Q breakfast   finasteride   5 mg Oral Daily   folic acid   1 mg Oral Daily   [START ON  01/19/2024] levofloxacin   750 mg Oral Daily   levothyroxine   112 mcg Oral Q0600   [START ON 01/19/2024] linezolid   600 mg Oral Q12H   loratadine   10 mg Oral Daily   magnesium  oxide  400 mg Oral BID   modafinil   200 mg Oral Daily   multivitamin with minerals  1 tablet Oral Daily   naproxen  250 mg Oral BID WC   nystatin   Topical TID   oxyCODONE   5 mg Oral Q6H   pantoprazole   40 mg Oral Daily   polyethylene glycol  17 g Oral Daily   potassium chloride   40 mEq Oral BID   predniSONE   2.5 mg Oral Q breakfast   rosuvastatin   5 mg Oral Daily   senna  2 tablet Oral BID   sodium chloride  flush  3 mL Intravenous Q12H   thiamine  100 mg Oral Daily   zinc  sulfate (50mg  elemental zinc )  220 mg Oral Daily   Continuous Infusions:  ceFEPime  (MAXIPIME ) IV 2 g (01/12/24 1052)   DAPTOmycin  700 mg (01/11/24 1530)   PRN Meds:.acetaminophen , bisacodyl , dextrose , HYDROmorphone  (DILAUDID ) injection, ipratropium-albuterol , melatonin, menthol -cetylpyridinium **OR** phenol, ondansetron  **OR** ondansetron  (ZOFRAN ) IV, sodium chloride  flush, sodium chloride  flush, sodium chloride  flush  Family Communication: Wife at bedside  Disposition: Status is: Inpatient Remains inpatient appropriate because: IV antibiotics     Time spent: 36 minutes  Length of stay: 38 days  Author: Jodeane Mulligan, DO 01/12/2024 11:38 AM  For on call review [email or web address].

## 2024-01-12 NOTE — Progress Notes (Signed)
 Subjective: Patient reports doing ok, currently eating a magic cup with his wifes assistance  Objective: Vital signs in last 24 hours: Temp:  [98.8 F (37.1 C)-99.8 F (37.7 C)] 99.3 F (37.4 C) (05/30 0836) Pulse Rate:  [88-99] 99 (05/30 0836) Resp:  [16-18] 18 (05/30 0836) BP: (109-131)/(51-73) 129/52 (05/30 0836) SpO2:  [94 %-98 %] 95 % (05/30 0836)  Intake/Output from previous day: 05/29 0701 - 05/30 0700 In: 240 [P.O.:240] Out: 1650 [Urine:1650] Intake/Output this shift: No intake/output data recorded.  Neurologic: Grossly normal  Lab Results: Lab Results  Component Value Date   WBC 10.1 01/12/2024   HGB 7.8 (L) 01/12/2024   HCT 25.0 (L) 01/12/2024   MCV 94.3 01/12/2024   PLT 225 01/12/2024   Lab Results  Component Value Date   INR 1.1 12/21/2023   BMET Lab Results  Component Value Date   NA 143 01/12/2024   K 3.2 (L) 01/12/2024   CL 114 (H) 01/12/2024   CO2 19 (L) 01/12/2024   GLUCOSE 88 01/12/2024   BUN 28 (H) 01/12/2024   CREATININE 0.97 01/12/2024   CALCIUM  7.6 (L) 01/12/2024    Studies/Results: DG CHEST PORT 1 VIEW Result Date: 01/10/2024 CLINICAL DATA:  Acute dyspnea. EXAM: PORTABLE CHEST 1 VIEW COMPARISON:  CT chest 12/24/2023 FINDINGS: RIGHT PICC line tip in distal SVC. Stable cardiac silhouette. There is interval increase in bilateral patchy airspace disease. No focal consolidation. Small effusions. No pneumothorax. RIGHT shoulder arthroplasty IMPRESSION: 1. Interval increase in bilateral patchy airspace disease. Findings concerning for multifocal pneumonia. 2. Small effusions. Electronically Signed   By: Deboraha Fallow M.D.   On: 01/10/2024 16:25    Assessment/Plan: Doing ok, awake and conversing. Doesn't seem to be in much pain. No new nsgy recom   LOS: 38 days    Norman Lambert 01/12/2024, 8:41 AM

## 2024-01-12 NOTE — Progress Notes (Signed)
 RT called for NT suctioning, pt had gurgling sounds in the trachea and a weak cough.  I was able to obtain moderate white secretions with NT suctioning.

## 2024-01-12 NOTE — Progress Notes (Signed)
 Modified Barium Swallow Study  Patient Details  Name: Norman Lambert MRN: [id number] Date of Birth: 12/02/1950  Today's Date: 01/12/2024  Modified Barium Swallow completed.  Full report located under Chart Review in the Imaging Section.  History of Present Illness KAMARE CASPERS is a 73 yo male presenting to ED 4/22 with severe lower back pain. Underwent laminectomy L3-4 with posterior fixation L2-S1. Recovery complicated by pain and hypotension and was transferred to ICU 5/3. PMH includes gout, arthritis, HTN, HLD, PVD, RBBB, R reverse TSA, sleep apnea   Clinical Impression Pt was administered Ativan at 1248 and was lethargic for the MBS. He was unable to sustain alertness or hold his head up independently. The entirety of boluses of thin liquids and purees were lost anteriorly without awareness. A pharyngeal swallow response was not observed throughout the study. Although his presentation has likely been impacted by recent medication administration, his level of alertness was different than previously observed during session earlier this date and he had increased difficulty containing boluses orally. His breathing is audible and he coughs persistently, which is wet in quality. At this time, recommend pt be made NPO pending further assessment. Suspect MBS will need to be repeated prior to resuming diet. SLP will continue following.  Factors that may increase risk of adverse event in presence of aspiration [name] & [name] 2021): Poor general health and/or compromised immunity;Reduced cognitive function;Limited mobility;Frail or deconditioned;Dependence for feeding and/or oral hygiene  Swallow Evaluation Recommendations Recommendations: NPO Oral care recommendations: Oral care QID (4x/day);Oral care before ice chips/water      Amil Kale, M.A., CCC-SLP Speech Language Pathology, Acute Rehabilitation Services  Secure Chat preferred [phone]  01/12/2024,3:40 PM

## 2024-01-12 NOTE — Progress Notes (Signed)
 Speech Language Pathology Treatment: Dysphagia  Patient Details Name: Norman Lambert MRN: [id number] DOB: 26-Dec-1950 Today's Date: 01/12/2024 Time: 0952-1001 SLP Time Calculation (min) (ACUTE ONLY): 9 min  Assessment / Plan / Recommendation Clinical Impression  Pt appears more confused compared to previous sessions. He coughs in the absence of POs and it is wet sounding but strong. He required increased support to take cup and spoon sips of nectar thick liquids, which resulted in immediate coughing. Education was provided to pt's wife. Discussed with MD, who ordered an esophagram. Recommend repeating an MBS in conjunction given change in presentation. SLP will coordinate with radiology to complete MBS as scheduling allows. Recommend he continue a regular diet with nectar thick liquids pending results.    HPI HPI: Norman Lambert is a 73 yo male presenting to ED 4/22 with severe lower back pain. Underwent laminectomy L3-4 with posterior fixation L2-S1. Recovery complicated by pain and hypotension and was transferred to ICU 5/3. PMH includes gout, arthritis, HTN, HLD, PVD, RBBB, R reverse TSA, sleep apnea      SLP Plan  MBS      Recommendations for follow up therapy are one component of a multi-disciplinary discharge planning process, led by the attending physician.  Recommendations may be updated based on patient status, additional functional criteria and insurance authorization.    Recommendations  Diet recommendations: Regular;Nectar-thick liquid Liquids provided via: Cup;Straw Medication Administration: Crushed with puree Supervision: Staff to assist with self feeding;Full supervision/cueing for compensatory strategies Compensations: Minimize environmental distractions;Slow rate;Small sips/bites Postural Changes and/or Swallow Maneuvers: Seated upright 90 degrees;Upright 30-60 min after meal                  Oral care BID   Frequent or constant Supervision/Assistance Dysphagia,  oropharyngeal phase (R13.12)     MBS     Amil Kale, M.A., CCC-SLP Speech Language Pathology, Acute Rehabilitation Services  Secure Chat preferred [phone]   01/12/2024, 10:26 AM

## 2024-01-12 NOTE — Progress Notes (Signed)
 Routine line care: Pt's wife reports RUE appears swollen. Arm is soft, no erythema noted, not grossly edematous but larger than left. Wife reports pt is constantly leaning to R. RN made aware. Will continue to monitor.

## 2024-01-12 NOTE — TOC Progression Note (Signed)
 Transition of Care [facility]) - Progression Note    Patient Details  Name: WAI LITT MRN: [id number] Date of Birth: [birth year]  [facility] CM/SW Contact  Jayvin Hurrell A [place], LCSW Phone Number: 01/12/2024, 3:01 PM  Clinical Narrative:     CSW met with pt and pt's wife at bedside. She informed CSW that they accepted bed offer to [facility] facility. CSW reached out to Rob in admissions and provided update. Left vm with contact information to reach out back out to CSW.  CSW to start insurance authorization as pt nears medical stability.    TOC will continue to follow.   Expected Discharge Plan: Skilled Nursing Facility Barriers to Discharge: Continued Medical Work up  Expected Discharge Plan and Services In-house Referral: Clinical Social Work   Post Acute Care Choice: Home Health Living arrangements for the past 2 months: Single Family Home                                       Social Determinants of Health (SDOH) Interventions SDOH Screenings   Food Insecurity: No Food Insecurity (12/06/2023)  Housing: Low Risk  (12/06/2023)  Transportation Needs: No Transportation Needs (12/06/2023)  Utilities: Not At Risk (12/06/2023)  Financial Resource Strain: Low Risk  (06/05/2023)   Received from [facility]  Physical Activity: Unknown (06/05/2023)   Received from [facility]  Social Connections: Moderately Isolated (12/11/2023)  Stress: No Stress Concern Present (08/21/2023)   [facility] from [facility]  Tobacco Use: Medium Risk (12/25/2023)    Readmission Risk Interventions     No data to display

## 2024-01-13 DIAGNOSIS — G9341 Metabolic encephalopathy: Secondary | ICD-10-CM | POA: Diagnosis not present

## 2024-01-13 DIAGNOSIS — R1310 Dysphagia, unspecified: Secondary | ICD-10-CM

## 2024-01-13 DIAGNOSIS — N401 Enlarged prostate with lower urinary tract symptoms: Secondary | ICD-10-CM

## 2024-01-13 DIAGNOSIS — R7881 Bacteremia: Secondary | ICD-10-CM | POA: Diagnosis not present

## 2024-01-13 DIAGNOSIS — R3911 Hesitancy of micturition: Secondary | ICD-10-CM

## 2024-01-13 DIAGNOSIS — D62 Acute posthemorrhagic anemia: Secondary | ICD-10-CM | POA: Diagnosis not present

## 2024-01-13 LAB — CBC
HCT: 25.3 % — ABNORMAL LOW (ref 39.0–52.0)
Hemoglobin: 7.7 g/dL — ABNORMAL LOW (ref 13.0–17.0)
MCH: 28.7 pg (ref 26.0–34.0)
MCHC: 30.4 g/dL (ref 30.0–36.0)
MCV: 94.4 fL (ref 80.0–100.0)
Platelets: 223 10*3/uL (ref 150–400)
RBC: 2.68 MIL/uL — ABNORMAL LOW (ref 4.22–5.81)
RDW: 15.1 % (ref 11.5–15.5)
WBC: 13.3 10*3/uL — ABNORMAL HIGH (ref 4.0–10.5)
nRBC: 0 % (ref 0.0–0.2)

## 2024-01-13 LAB — BASIC METABOLIC PANEL WITH GFR
Anion gap: 11 (ref 5–15)
BUN: 34 mg/dL — ABNORMAL HIGH (ref 8–23)
CO2: 20 mmol/L — ABNORMAL LOW (ref 22–32)
Calcium: 7.6 mg/dL — ABNORMAL LOW (ref 8.9–10.3)
Chloride: 115 mmol/L — ABNORMAL HIGH (ref 98–111)
Creatinine, Ser: 1.16 mg/dL (ref 0.61–1.24)
GFR, Estimated: >60 mL/min (ref >60)
Glucose, Bld: 95 mg/dL (ref 70–99)
Potassium: 3.7 mmol/L (ref 3.5–5.1)
Sodium: 146 mmol/L — ABNORMAL HIGH (ref 135–145)

## 2024-01-13 LAB — GLUCOSE, CAPILLARY
Glucose-Capillary: 83 mg/dL (ref 70–99)
Glucose-Capillary: 78 mg/dL (ref 70–99)
Glucose-Capillary: 86 mg/dL (ref 70–99)
Glucose-Capillary: 98 mg/dL (ref 70–99)

## 2024-01-13 LAB — MAGNESIUM: Magnesium: 2.3 mg/dL (ref 1.7–2.4)

## 2024-01-13 MED ORDER — OXYCODONE HCL 5 MG PO TABS: 5.0000 mg | ORAL_TABLET | Freq: Four times a day (QID) | ORAL | Status: DC | PRN

## 2024-01-13 MED ORDER — LORAZEPAM 2 MG/ML IJ SOLN
0.5000 mg | Freq: Two times a day (BID) | INTRAMUSCULAR | Status: DC | PRN
  Administered 2024-01-13: 0.5 mg via INTRAVENOUS
  Filled 2024-01-13: qty 1

## 2024-01-13 MED ORDER — DEXTROSE-SODIUM CHLORIDE 5-0.45 % IV SOLN: INTRAVENOUS | Status: DC

## 2024-01-13 MED ORDER — FUROSEMIDE 10 MG/ML IJ SOLN
40.0000 mg | Freq: Every day | INTRAMUSCULAR | Status: DC
  Administered 2024-01-13 – 2024-01-14 (×2): 40 mg via INTRAVENOUS
  Filled 2024-01-13 (×2): qty 4

## 2024-01-13 MED ORDER — LACTATED RINGERS IV BOLUS
1000.0000 mL | Freq: Once | INTRAVENOUS | Status: AC
  Administered 2024-01-13: 1000 mL via INTRAVENOUS

## 2024-01-13 NOTE — Progress Notes (Signed)
 Patient is restless. Trying to remove tele stickers and pure wick. Cardiac monitoring kept stand by. Made the provider aware. Will continue to monitor

## 2024-01-13 NOTE — Progress Notes (Signed)
 TRH night cross cover note:   I was notified by the patient's RN that this patient is complaining of anxiety, and is restless.  He has an existing order for prn p.o. Ativan  for anxiety, but is currently n.p.o. after failing his RN swallow screen.  I subsequently changed his existing prn p.o. Ativan  order to Ativan  0.5 mg IV every 12 hours as needed for anxiety.    Camelia Cavalier, DO Hospitalist

## 2024-01-13 NOTE — Progress Notes (Signed)
 Speech Language Pathology Treatment: Dysphagia  Patient Details Name: Norman Lambert MRN: [id number] DOB: 06-Aug-1951 Today's Date: 01/13/2024 Time: 3244-0102 SLP Time Calculation (min) (ACUTE ONLY): 18 min  Assessment / Plan / Recommendation Clinical Impression  Pt is lethargic, disoriented, and inattentive. He is more agitated that observed previously, moaning and pulling at lines/leads. He does not follow commands. SLP provided thorough oral care, removing built up secretions from his oral surfaces. Attempted spoonfuls of nectar thick liquids and pt was unable to form a labial seal or initiate acceptance of the bolus. Education was provided regarding reasoning for NPO order as well as benefit of frequent oral care. Recommend NPO status be maintained except for ice chips in moderation only when pt is awake and alert and only after oral care. SLP will continue following for readiness to participate in an objective study, which will likely be necessary before resuming oral diet.    HPI HPI: Norman Lambert is a 73 yo male presenting to ED 4/22 with severe lower back pain. Underwent laminectomy L3-4 with posterior fixation L2-S1. Recovery complicated by pain and hypotension and was transferred to ICU 5/3. PMH includes gout, arthritis, HTN, HLD, PVD, RBBB, R reverse TSA, sleep apnea      SLP Plan  Continue with current plan of care      Recommendations for follow up therapy are one component of a multi-disciplinary discharge planning process, led by the attending physician.  Recommendations may be updated based on patient status, additional functional criteria and insurance authorization.    Recommendations  Diet recommendations: NPO Medication Administration: Via alternative means                  Oral care QID;Oral care prior to ice chip/H20;Staff/trained caregiver to provide oral care   Frequent or constant Supervision/Assistance Dysphagia, oropharyngeal phase (R13.12)     Continue  with current plan of care     Amil Kale, M.A., CCC-SLP Speech Language Pathology, Acute Rehabilitation Services  Secure Chat preferred [phone]   01/13/2024, 11:22 AM

## 2024-01-13 NOTE — Progress Notes (Signed)
 Progress Note   Patient: Norman Lambert:[id number] DOB: 24-Jul-1951 DOA: 12/05/2023  DOS: the patient was seen and examined on 01/13/2024   Brief hospital course:  73 year old male with past medical history of hypertension, gout, hyperlipidemia, hypothyroidism, sleep apnea was admitted on 4/25 for reduction and internal fixation of previous fusions involving L2-S1. Postoperative course initially seemed unremarkable but subsequently he continued to have increased pain involving the left foot seemingly not responding to as needed narcotics. Imaging of the back was unremarkable, he continued to have drain VAC in place.  Subsequently on 12/15/2023 patient had hypotension with tachycardia and fever up to 103 F.  Patient was then started on IV cefepime  and vancomycin  with concern about sepsis.  There was significant wound erythema with serosanguineous foul-smelling drainage from the surgical site.  Patient had Levophed  briefly but subsequently was weaned off    Significant hospital events 4/25 Extensive L2-S1 lumbar surgery 4/30 New onset pain  5/1 Ongoing pain with borderline blood pressure 5/2 Pain, leukocytosis fever, 5/3 Hypotension, not responding to crystalloid started on broad-spectrum antibiotics wound assessed by surgical team culture sent imaging pending ICU transfer. CT imaging of lumbar spine was negative for definitive abscess there were stable fractures at L3 and L4 with possible new fracture involving the transverse process of L2 and L3 on the left postoperative changes noted.CT angio of chest was negative for pulmonary emboli had bibasilar airspace disease the right was a little worse than the left consistent with possible pneumonia there was aortic aneurysmal dilation of the ascending aorta measuring at 4.5 cm 5/4 Weaned off norepinephrine , Blood cultures from 1 out of 2 samples growing gram-negative rods with BC ID showing both proteus and Enterobacterales, MRSA PCR negative. Narrowed to  ceftriaxone . 5/5 Remains on low dose NE. Febrile. BCx growing proteus.  5/6 Wound Cx with proteus, klebsiella aerogenes. Abx re-broadened to cefepime . 5/8 Required low dose norepi overnight but off this AM. His wound vac was changed this AM and has been having some pain 5/9 no issues overnight, denies any acute pain this a.m. 5/14-continues to have back pain. 5/17-has been advised bedrest for back pain.   5/19-removal of wound VAC.  PT evaluation. 5/20-concerns for aspiration show speech therapy was consulted,. for modified barium swallow. 5/21-speech therapy recommend regular diet. 5/27: Hemodynamically stable, still having significant pain, family wants SNF near [place]-TOC is working on it.  Mentation improved 5/28 - Mild aspiration with feeding again.  Made NPO 5/29 - resume diet per SLP. 5/30 - npo again   Assessment and Plan:   Acute metabolic encephalopathy - Waxing and waning.  Appeared to initially be resolved however this morning and yesterday appears more withdrawn, agitated, less alert.  May be result of delirium, polypharmacy, mild dehydration.  Continue to monitor closely.  Reorient with family at bedside.   Sepsis secondary to postop wound infection - 5/3 noted bacteremia with Klebsiella and Proteus.  Antibiotic regimen followed closely by infectious disease.  Continues on cefepime  and daptomycin  for 4 weeks (through 6/5), transition to oral Levaquin  and linezolid  6/6 x 2 weeks.     L3-L4 laminectomy redo/acute on chronic L2-L5 fracture/osteoporosis/back pain - Followed closely by neurosurgery.  Antibiotic therapy as above.  PT on board.  Pain medications with naproxen  to 50 mg twice daily.  oxycodone  5mg  q6h given dysphagia (unable to crush oxycontin ).  However may be resulting in too much sedation.  Will cut back to every 6 hours as needed and monitor response.   Acute normocytic  anemia - Likely exacerbated by procedure/blood loss as well as prolonged hospital stay.   Currently appears to be mostly stable.  Will recheck CBC in AM.   Diabetes mellitus - Hypoglycemia resolved.  Insulin  sliding scale on board.   Dysphagia with aspiration - Had another aspiration event 5/28 with feeding, increased coughing, noting worsening right lower lobe patchiness.  No hypoxia.  Patient already on antibiotics as above.  Evaluated by SLP.  Attempted esophagram however patient was too sedated.  Continues to fail swallow eval's, currently NPO.  Working closely with SLP, will order esophogram again early next week.   Hypokalemia - Now resolved after replenishment.  Will recheck BMP and mag in AM.  Mild hypernatremia - Likely mild dehydration given patient's worsening dysphagia and intermittent n.p.o. status.  Will place on D5/half-normal at 75 mL/h.  Recheck BMP in AM.  Paroxysmal atrial fibrillation - CHA2DS2-VASc score 3.  Likely exacerbated by infection.  Currently normal sinus rhythm.  Anticoagulation/antiplatelets deferred to neurosurgery team.   GERD/BPH/hypothyroidism - Continue regimen   Hypertension - Off midodrine .  Blood pressure stable.   Goals of care - Currently working with PT, neurosurgery, TOC on disposition planning.  IV antibiotics will complete 6/5 with subsequent transition to p.o. afterwards.  Likely good timing to transition to STR.   Subjective: Patient appears more lethargic and fidgety this morning.  Not so alert and talkative.  Failed self again yesterday secondary to inability to swallow contrast and follow commands.  This morning appears largely the same.  Still having cough with upper airway secretions.  No fever, hypoxia, nausea, vomiting, abdominal pain.  Physical Exam:  Vitals:   01/13/24 0452 01/13/24 0818 01/13/24 1233 01/13/24 1255  BP: (!) 150/40 110/72 123/64   Pulse: 99 90 (!) 102 98  Resp: 19 19    Temp: 97.8 F (36.6 C) 98.3 F (36.8 C) 98.4 F (36.9 C)   TempSrc: Oral Oral Oral   SpO2: 94% 92% 90%   Weight:       Height:        GENERAL:  Alert, pleasant, no acute distress  HEENT:  EOMI CARDIOVASCULAR:  RRR, no murmurs appreciated RESPIRATORY: Coarse cough, mild expiratory wheezing GASTROINTESTINAL:  Soft, nontender, nondistended EXTREMITIES:  No LE edema bilaterally NEURO:  No new focal deficits appreciated SKIN:  No rashes noted PSYCH:  Appropriate mood and affect    Data Reviewed:  There are no new results to review at this time.  Previous records (including but not limited to H&P, progress notes, nursing notes, TOC management) were reviewed in assessment of this patient.  Labs: CBC: Recent Labs  Lab 01/08/24 0458 01/11/24 0405 01/12/24 0515 01/13/24 0319  WBC 9.7 8.5 10.1 13.3*  HGB 8.2* 7.2* 7.8* 7.7*  HCT 26.9* 23.7* 25.0* 25.3*  MCV 95.1 96.7 94.3 94.4  PLT 170 197 225 223   Basic Metabolic Panel: Recent Labs  Lab 01/08/24 0458 01/11/24 0405 01/11/24 1436 01/12/24 0515 01/13/24 0319  NA 141 145 143 143 146*  K 3.5 3.0* 3.4* 3.2* 3.7  CL 108 116* 113* 114* 115*  CO2 19* 21* 22 19* 20*  GLUCOSE 99 87 108* 88 95  BUN 17 25* 25* 28* 34*  CREATININE 0.87 0.78 0.91 0.97 1.16  CALCIUM  7.9* 7.5* 7.6* 7.6* 7.6*  MG 2.1 2.3 2.1 2.2 2.3   Liver Function Tests: Recent Labs  Lab 01/08/24 0458  AST 22  ALT 18  ALKPHOS 96  BILITOT 1.2  PROT 6.4*  ALBUMIN  2.0*  CBG: Recent Labs  Lab 01/12/24 1247 01/12/24 1750 01/12/24 2015 01/13/24 0830 01/13/24 1230  GLUCAP 121* 124* 103* 83 78    Scheduled Meds:  (feeding supplement) PROSource Plus  30 mL Oral BID BM   vitamin C   1,000 mg Oral Daily   calcitonin (salmon)  1 spray Alternating Nares Daily   calcium  carbonate  1 tablet Oral Q breakfast   Chlorhexidine  Gluconate Cloth  6 each Topical Daily   cholecalciferol   1,000 Units Oral Daily   cyanocobalamin   1,000 mcg Oral Daily   enoxaparin  (LOVENOX ) injection  40 mg Subcutaneous Daily   feeding supplement  237 mL Oral BID BM   ferrous sulfate   325 mg Oral Q  breakfast   finasteride   5 mg Oral Daily   folic acid   1 mg Oral Daily   [START ON 01/19/2024] levofloxacin   750 mg Oral Daily   levothyroxine   112 mcg Oral Q0600   [START ON 01/19/2024] linezolid   600 mg Oral Q12H   loratadine   10 mg Oral Daily   magnesium  oxide  400 mg Oral BID   modafinil   200 mg Oral Daily   multivitamin with minerals  1 tablet Oral Daily   naproxen   250 mg Oral BID WC   nystatin    Topical TID   oxyCODONE   5 mg Oral Q6H   pantoprazole   40 mg Oral Daily   polyethylene glycol  17 g Oral Daily   predniSONE   2.5 mg Oral Q breakfast   rosuvastatin   5 mg Oral Daily   senna  2 tablet Oral BID   sodium chloride  flush  3 mL Intravenous Q12H   thiamine   100 mg Oral Daily   zinc  sulfate (50mg  elemental zinc )  220 mg Oral Daily   Continuous Infusions:  ceFEPime  (MAXIPIME ) IV 2 g (01/13/24 0822)   DAPTOmycin  Stopped (01/12/24 2100)   dextrose  5 % and 0.45 % NaCl 75 mL/hr at 01/13/24 0906   PRN Meds:.acetaminophen , bisacodyl , dextrose , HYDROmorphone  (DILAUDID ) injection, ipratropium-albuterol , LORazepam , melatonin, menthol -cetylpyridinium **OR** phenol, ondansetron  **OR** ondansetron  (ZOFRAN ) IV, sodium chloride  flush, sodium chloride  flush, sodium chloride  flush  Family Communication: Wife at bedside  Disposition: Status is: Inpatient Remains inpatient appropriate because: Sepsis, dysphagia     Time spent: 50 minutes  Length of inpatient stay: 39 days  Author: Jodeane Mulligan, DO 01/13/2024 1:30 PM  For on call review [email or web address].

## 2024-01-14 ENCOUNTER — Inpatient Hospital Stay (HOSPITAL_COMMUNITY)

## 2024-01-14 DIAGNOSIS — N179 Acute kidney failure, unspecified: Secondary | ICD-10-CM | POA: Diagnosis not present

## 2024-01-14 DIAGNOSIS — A419 Sepsis, unspecified organism: Secondary | ICD-10-CM | POA: Diagnosis not present

## 2024-01-14 DIAGNOSIS — R34 Anuria and oliguria: Secondary | ICD-10-CM | POA: Diagnosis not present

## 2024-01-14 DIAGNOSIS — G9341 Metabolic encephalopathy: Secondary | ICD-10-CM | POA: Diagnosis not present

## 2024-01-14 LAB — COMPREHENSIVE METABOLIC PANEL WITH GFR
ALT: 12 U/L (ref 0–44)
AST: 23 U/L (ref 15–41)
Albumin: <1.5 g/dL — ABNORMAL LOW (ref 3.5–5.0)
Alkaline Phosphatase: 87 U/L (ref 38–126)
Anion gap: 8 (ref 5–15)
BUN: 45 mg/dL — ABNORMAL HIGH (ref 8–23)
CO2: 20 mmol/L — ABNORMAL LOW (ref 22–32)
Calcium: 7.1 mg/dL — ABNORMAL LOW (ref 8.9–10.3)
Chloride: 120 mmol/L — ABNORMAL HIGH (ref 98–111)
Creatinine, Ser: 1.95 mg/dL — ABNORMAL HIGH (ref 0.61–1.24)
GFR, Estimated: 35 mL/min — ABNORMAL LOW (ref >60)
Glucose, Bld: 109 mg/dL — ABNORMAL HIGH (ref 70–99)
Potassium: 4 mmol/L (ref 3.5–5.1)
Sodium: 148 mmol/L — ABNORMAL HIGH (ref 135–145)
Total Bilirubin: 0.6 mg/dL (ref 0.0–1.2)
Total Protein: 5.6 g/dL — ABNORMAL LOW (ref 6.5–8.1)

## 2024-01-14 LAB — URINALYSIS, ROUTINE W REFLEX MICROSCOPIC
Bilirubin Urine: NEGATIVE
Glucose, UA: NEGATIVE mg/dL
Ketones, ur: 5 mg/dL — AB
Nitrite: NEGATIVE
Protein, ur: 100 mg/dL — AB
Specific Gravity, Urine: 1.019 (ref 1.005–1.030)
WBC, UA: >50 WBC/hpf (ref 0–5)
pH: 5 (ref 5.0–8.0)

## 2024-01-14 LAB — BLOOD GAS, ARTERIAL
Acid-base deficit: 5.6 mmol/L — ABNORMAL HIGH (ref 0.0–2.0)
Bicarbonate: 20.1 mmol/L (ref 20.0–28.0)
O2 Saturation: 98.1 %
Patient temperature: 37.4
pCO2 arterial: 40 mmHg (ref 32–48)
pH, Arterial: 7.31 — ABNORMAL LOW (ref 7.35–7.45)
pO2, Arterial: 151 mmHg — ABNORMAL HIGH (ref 83–108)

## 2024-01-14 LAB — GLUCOSE, CAPILLARY
Glucose-Capillary: 93 mg/dL (ref 70–99)
Glucose-Capillary: 72 mg/dL (ref 70–99)
Glucose-Capillary: 97 mg/dL (ref 70–99)

## 2024-01-14 LAB — RESPIRATORY PANEL BY PCR

## 2024-01-14 LAB — STREP PNEUMONIAE URINARY ANTIGEN: Strep Pneumo Urinary Antigen: NEGATIVE

## 2024-01-14 LAB — RESP PANEL BY RT-PCR (RSV, FLU A&B, COVID)  RVPGX2
Influenza A by PCR: NEGATIVE
Influenza B by PCR: NEGATIVE
Resp Syncytial Virus by PCR: NEGATIVE
SARS Coronavirus 2 by RT PCR: NEGATIVE

## 2024-01-14 LAB — CBC
HCT: 24.9 % — ABNORMAL LOW (ref 39.0–52.0)
Hemoglobin: 7.6 g/dL — ABNORMAL LOW (ref 13.0–17.0)
MCH: 29.5 pg (ref 26.0–34.0)
MCHC: 30.5 g/dL (ref 30.0–36.0)
MCV: 96.5 fL (ref 80.0–100.0)
Platelets: 229 10*3/uL (ref 150–400)
RBC: 2.58 MIL/uL — ABNORMAL LOW (ref 4.22–5.81)
RDW: 15.8 % — ABNORMAL HIGH (ref 11.5–15.5)
WBC: 12.1 10*3/uL — ABNORMAL HIGH (ref 4.0–10.5)
nRBC: 0 % (ref 0.0–0.2)

## 2024-01-14 LAB — LACTIC ACID, PLASMA
Lactic Acid, Venous: 1.7 mmol/L (ref 0.5–1.9)
Lactic Acid, Venous: 1.5 mmol/L (ref 0.5–1.9)

## 2024-01-14 LAB — MAGNESIUM: Magnesium: 2.4 mg/dL (ref 1.7–2.4)

## 2024-01-14 MED ORDER — HYDROCORTISONE SOD SUC (PF) 100 MG IJ SOLR
100.0000 mg | Freq: Once | INTRAMUSCULAR | Status: AC
  Administered 2024-01-14: 100 mg via INTRAVENOUS
  Filled 2024-01-14: qty 2

## 2024-01-14 MED ORDER — SODIUM CHLORIDE 0.9 % IV BOLUS: 1000.0000 mL | Freq: Once | INTRAVENOUS | Status: DC

## 2024-01-14 MED ORDER — LACTATED RINGERS IV BOLUS
1000.0000 mL | Freq: Once | INTRAVENOUS | Status: AC
  Administered 2024-01-14: 1000 mL via INTRAVENOUS

## 2024-01-14 MED ORDER — BUMETANIDE 0.25 MG/ML IJ SOLN
2.0000 mg | Freq: Two times a day (BID) | INTRAMUSCULAR | Status: DC
  Administered 2024-01-14: 2 mg via INTRAVENOUS
  Filled 2024-01-14 (×2): qty 8

## 2024-01-14 MED ORDER — LACTATED RINGERS IV BOLUS: 1000.0000 mL | Freq: Once | INTRAVENOUS | Status: DC

## 2024-01-14 MED ORDER — HYDROCORTISONE SOD SUC (PF) 100 MG IJ SOLR
100.0000 mg | Freq: Two times a day (BID) | INTRAMUSCULAR | Status: DC
  Administered 2024-01-15 – 2024-01-16 (×4): 100 mg via INTRAVENOUS
  Filled 2024-01-14 (×5): qty 2

## 2024-01-14 MED ORDER — ALBUMIN HUMAN 25 % IV SOLN
12.5000 g | Freq: Once | INTRAVENOUS | Status: AC
  Administered 2024-01-14: 12.5 g via INTRAVENOUS
  Filled 2024-01-14: qty 50

## 2024-01-14 MED ORDER — SCOPOLAMINE 1 MG/3DAYS TD PT72
1.0000 | MEDICATED_PATCH | TRANSDERMAL | Status: DC
  Administered 2024-01-14: 1.5 mg via TRANSDERMAL
  Filled 2024-01-14: qty 1

## 2024-01-14 MED ORDER — BUMETANIDE 0.25 MG/ML IJ SOLN
2.0000 mg | Freq: Three times a day (TID) | INTRAMUSCULAR | Status: DC
  Filled 2024-01-14 (×8): qty 8

## 2024-01-14 MED ORDER — SODIUM CHLORIDE 0.9 % IV SOLN
500.0000 mg | INTRAVENOUS | Status: DC
  Administered 2024-01-14 – 2024-01-16 (×3): 500 mg via INTRAVENOUS
  Filled 2024-01-14 (×3): qty 5

## 2024-01-14 MED ORDER — IPRATROPIUM-ALBUTEROL 0.5-2.5 (3) MG/3ML IN SOLN
3.0000 mL | Freq: Four times a day (QID) | RESPIRATORY_TRACT | Status: DC
  Administered 2024-01-14 – 2024-01-16 (×9): 3 mL via RESPIRATORY_TRACT
  Filled 2024-01-14 (×9): qty 3

## 2024-01-14 MED ORDER — METRONIDAZOLE 500 MG/100ML IV SOLN
500.0000 mg | Freq: Two times a day (BID) | INTRAVENOUS | Status: DC
  Administered 2024-01-14 – 2024-01-16 (×5): 500 mg via INTRAVENOUS
  Filled 2024-01-14 (×5): qty 100

## 2024-01-14 MED ORDER — LACTATED RINGERS IV BOLUS
500.0000 mL | Freq: Once | INTRAVENOUS | Status: AC
  Administered 2024-01-14: 500 mL via INTRAVENOUS

## 2024-01-14 MED ORDER — SODIUM CHLORIDE 0.9 % IV SOLN
2.0000 g | Freq: Two times a day (BID) | INTRAVENOUS | Status: DC
  Administered 2024-01-14 – 2024-01-15 (×2): 2 g via INTRAVENOUS
  Filled 2024-01-14 (×2): qty 12.5

## 2024-01-14 NOTE — Progress Notes (Signed)
 Patient NT suctioned for copious amounts of pale yellow secretions through L nare. Patient is minimally responsive, did have weak cough. Sats in the low 80s on 1L prior to suctioning, increased to 5L post NTS with sats increasing to low 90s. RN aware. Other vitals WNL. BBS rhonchi.

## 2024-01-14 NOTE — Progress Notes (Signed)
 PT was NTS to obtain sputum collection. ABG also obtained and sent to lab at this time.

## 2024-01-14 NOTE — Progress Notes (Signed)
 PHARMACY NOTE:  ANTIMICROBIAL RENAL DOSAGE ADJUSTMENT  Current antimicrobial regimen includes a mismatch between antimicrobial dosage and estimated renal function.  As per policy approved by the Pharmacy & Therapeutics and Medical Executive Committees, the antimicrobial dosage will be adjusted accordingly.  Current antimicrobial dosage:  Cefepime  2 g IV Q8h  Indication: wound infection  Renal Function:  Estimated Creatinine Clearance: 45.4 mL/min (A) (by C-G formula based on SCr of 1.95 mg/dL (H)). []      On intermittent HD, scheduled: []      On CRRT    Antimicrobial dosage has been changed to:  Cefepime  2 g IV Q12h  Additional comments: N/A   Thank you for allowing pharmacy to be a part of this patient's care.  Juleen Oakland, PharmD PGY1 Pharmacy Resident 01/14/2024 1:38 PM

## 2024-01-14 NOTE — Progress Notes (Addendum)
 New orders received for urine antigen strep and legionella. Checked with the lab for possibility for checking antigen from the previous UA sample which has already been sent at 1220PM.   Didnt send new urine specimen for antigen test per lab.

## 2024-01-14 NOTE — Plan of Care (Signed)

## 2024-01-14 NOTE — Progress Notes (Signed)
 Progress Note   Patient: Norman Lambert ZOX:[id number] DOB: Apr 28, 1951 DOA: 12/05/2023  DOS: the patient was seen and examined on 01/14/2024   Brief hospital course:  73 year old male with past medical history of hypertension, gout, hyperlipidemia, hypothyroidism, sleep apnea was admitted on 4/25 for reduction and internal fixation of previous fusions involving L2-S1. Postoperative course initially seemed unremarkable but subsequently he continued to have increased pain involving the left foot seemingly not responding to as needed narcotics. Imaging of the back was unremarkable, he continued to have drain VAC in place.  Subsequently on 12/15/2023 patient had hypotension with tachycardia and fever up to 103 F.  Patient was then started on IV cefepime  and vancomycin  with concern about sepsis.  There was significant wound erythema with serosanguineous foul-smelling drainage from the surgical site.  Patient had Levophed  briefly but subsequently was weaned off    Significant hospital events 4/25 Extensive L2-S1 lumbar surgery 4/30 New onset pain  5/1 Ongoing pain with borderline blood pressure 5/2 Pain, leukocytosis fever, 5/3 Hypotension, not responding to crystalloid started on broad-spectrum antibiotics wound assessed by surgical team culture sent imaging pending ICU transfer. CT imaging of lumbar spine was negative for definitive abscess there were stable fractures at L3 and L4 with possible new fracture involving the transverse process of L2 and L3 on the left postoperative changes noted.CT angio of chest was negative for pulmonary emboli had bibasilar airspace disease the right was a little worse than the left consistent with possible pneumonia there was aortic aneurysmal dilation of the ascending aorta measuring at 4.5 cm 5/4 Weaned off norepinephrine , Blood cultures from 1 out of 2 samples growing gram-negative rods with BC ID showing both proteus and Enterobacterales, MRSA PCR negative. Narrowed to  ceftriaxone . 5/5 Remains on low dose NE. Febrile. BCx growing proteus.  5/6 Wound Cx with proteus, klebsiella aerogenes. Abx re-broadened to cefepime . 5/8 Required low dose norepi overnight but off this AM. His wound vac was changed this AM and has been having some pain 5/9 no issues overnight, denies any acute pain this a.m. 5/14-continues to have back pain. 5/17-has been advised bedrest for back pain.   5/19-removal of wound VAC.  PT evaluation. 5/20-concerns for aspiration show speech therapy was consulted,. for modified barium swallow. 5/21-speech therapy recommend regular diet. 5/27: Hemodynamically stable, still having significant pain, family wants SNF near [place]-TOC is working on it.  Mentation improved 5/28 - Mild aspiration with feeding again.  Made NPO 5/29 - resume diet per SLP. 5/30 - npo again 6/1 - becoming septic.  Encephalopathic, hypotensive, fever 100.5, oliguric.  Family discussion about goals of care, no ICU if it comes to it.   Assessment and Plan:   New episode of severe sepsis - This morning worsening encephalopathy, oliguria, hypotension, fever of 100.5.  Likely source aspiration however unclear given patient's chronic broad-spectrum antibiotic use.  Blood cultures ordered, IV fluid bolus ordered.  IV Flagyl and IV azithromycin added to cefepime  plus daptomycin .  UA ordered.  Will order hydrocortisone  IV as well.   Acute hypoxic respiratory failure with concern for ARDS - Acute worsening hypoxia now requiring 6-7 L nasal cannula.  Stat chest x-ray ordered and personally reviewed noting worsening bilateral patchiness suggestive of likely worsening aspirations or vascular congestion.  Rapid COVID/flu/RSV negative.  Rapid respiratory pathogen profile negative.  Strep pneumo urine antigen negative.  Continue to supplement O2 as above.  Attempt to wean as tolerated.  Discussed with the patient and family at bedside, do not want  to pursue intubation.  May be amenable to  BiPAP if warranted.  Oliguric acute kidney injury - Noted oliguria beginning yesterday evening.  200 mL overnight.  Foley catheter inserted this morning.  IV fluid boluses before.  Likely secondary to severe sepsis.  Will initiate on IV Bumex 2 mg every 12 hours.  IV fluid hydration, as above.  Monitor I's and O's closely.  Will recheck BMP and mag in AM.  Monitor for  Acute metabolic encephalopathy - Much worse this morning.  Mostly lethargic.  Likely result of sepsis.  Continue to monitor closely.  Dysphagia with aspiration - Had another aspiration event 5/28 with feeding, increased coughing, noting worsening right lower lobe patchiness.  Now worsening hypoxia.  Likely etiology of patient's sepsis above.  Patient already on antibiotics as above.  Attempted esophagram however patient was too sedated.  Continues to fail swallow eval's, currently NPO.    Sepsis secondary to postop wound infection - 5/3 noted bacteremia with Klebsiella and Proteus.  Antibiotic regimen followed closely by infectious disease.  Currently receiving per ID cefepime  and daptomycin  for 4 weeks (through 6/5), transition to oral Levaquin  and linezolid  6/6 x 2 weeks.     L3-L4 laminectomy redo/acute on chronic L2-L5 fracture/osteoporosis/back pain - Followed closely by neurosurgery.  Antibiotic therapy as above.  PT on board.   Acute normocytic anemia - Likely exacerbated by procedure/blood loss as well as prolonged hospital stay.  Currently appears to be mostly stable.  Will recheck CBC in AM.   Diabetes mellitus - Hypoglycemia resolved.  Insulin  sliding scale on board.   Hypokalemia - Now resolved after replenishment.  Will recheck BMP and mag in AM.   Mild hypernatremia - Likely mild dehydration given patient's worsening dysphagia and intermittent n.p.o. status.  Received D5/half-normal at 75 mL/h.  Recheck BMP in AM.   Paroxysmal atrial fibrillation - CHA2DS2-VASc score 3.  Likely exacerbated by infection.   Currently normal sinus rhythm.  Anticoagulation/antiplatelets deferred to neurosurgery team.   GERD/BPH/hypothyroidism - Continue regimen   Hypertension - Now hypotensive as above.   Goals of care - Patient with acute decompensation with septic appearance today.  Multiple family members at bedside today.  Noted worsening encephalopathy, hypotension, decreased urine output, fever, hypoxia.  Had a frank discussion about the patient's status, prognosis, goals of care.  At this time, as the the patient is not requiring vasopressor medications nor needing intubation, the family is willing to continue somewhat aggressive treatment and an effort to have the patient bounce back.  However should the patient continue to decline, warrant vasopressor medications, intubation, or dialysis, would agree discusses care plan.  Patient's wife had stated that she would not want him to suffer.  Would likely pursue comfort care should he decompensate to that degree given his worsening functional status, quality of life.  Will continue to monitor patient closely and update the family.   Subjective: Patient appearing septic this morning.  Encephalopathic, requiring more oxygen, intermittently hypotensive.  Very minimal urine output.  Family at bedside.  All questions addressed.  Goals of care discussed.  See above.  Physical Exam:  Vitals:   01/14/24 0938 01/14/24 1241 01/14/24 1342 01/14/24 1437  BP:  (!) 99/49 (!) 73/40 (!) 88/43  Pulse: 99 96  97  Resp: 18 19    Temp:  (!) 100.5 F (38.1 C)  99.3 F (37.4 C)  TempSrc:  Oral    SpO2: 90% 97%  97%  Weight:      Height:  GENERAL: Encephalopathic, ill-appearing HEENT: Nasal cannula CARDIOVASCULAR: Regular and tachycardic RESPIRATORY: Coarse breath sounds, mildly tachypneic/dyspneic GASTROINTESTINAL:  Soft, nontender, nondistended EXTREMITIES:  No LE edema bilaterally NEURO:  No new focal deficits appreciated SKIN:  No rashes noted PSYCH:  Encephalopathic   Data Reviewed:  Chest x-ray personally reviewed noting worsening bilateral patchiness suggestive of increased inflammation and vascular congestion.  Previous records (including but not limited to H&P, progress notes, nursing notes, TOC management) were reviewed in assessment of this patient.  Labs: CBC: Recent Labs  Lab 01/08/24 0458 01/11/24 0405 01/12/24 0515 01/13/24 0319 01/14/24 0800  WBC 9.7 8.5 10.1 13.3* 12.1*  HGB 8.2* 7.2* 7.8* 7.7* 7.6*  HCT 26.9* 23.7* 25.0* 25.3* 24.9*  MCV 95.1 96.7 94.3 94.4 96.5  PLT 170 197 225 223 229   Basic Metabolic Panel: Recent Labs  Lab 01/11/24 0405 01/11/24 1436 01/12/24 0515 01/13/24 0319 01/14/24 0800  NA 145 143 143 146* 148*  K 3.0* 3.4* 3.2* 3.7 4.0  CL 116* 113* 114* 115* 120*  CO2 21* 22 19* 20* 20*  GLUCOSE 87 108* 88 95 109*  BUN 25* 25* 28* 34* 45*  CREATININE 0.78 0.91 0.97 1.16 1.95*  CALCIUM  7.5* 7.6* 7.6* 7.6* 7.1*  MG 2.3 2.1 2.2 2.3 2.4   Liver Function Tests: Recent Labs  Lab 01/08/24 0458 01/14/24 0800  AST 22 23  ALT 18 12  ALKPHOS 96 87  BILITOT 1.2 0.6  PROT 6.4* 5.6*  ALBUMIN  2.0* <1.5*   CBG: Recent Labs  Lab 01/13/24 0830 01/13/24 1230 01/13/24 1649 01/13/24 2049 01/14/24 1255  GLUCAP 83 78 86 98 93    Scheduled Meds:  (feeding supplement) PROSource Plus  30 mL Oral BID BM   vitamin C   1,000 mg Oral Daily   bumetanide (BUMEX) IV  2 mg Intravenous Q12H   calcitonin (salmon)  1 spray Alternating Nares Daily   calcium  carbonate  1 tablet Oral Q breakfast   Chlorhexidine  Gluconate Cloth  6 each Topical Daily   cholecalciferol   1,000 Units Oral Daily   cyanocobalamin   1,000 mcg Oral Daily   enoxaparin  (LOVENOX ) injection  40 mg Subcutaneous Daily   feeding supplement  237 mL Oral BID BM   ferrous sulfate   325 mg Oral Q breakfast   finasteride   5 mg Oral Daily   folic acid   1 mg Oral Daily   ipratropium-albuterol   3 mL Nebulization Q6H   [START ON 01/19/2024]  levofloxacin   750 mg Oral Daily   levothyroxine   112 mcg Oral Q0600   [START ON 01/19/2024] linezolid   600 mg Oral Q12H   loratadine   10 mg Oral Daily   magnesium  oxide  400 mg Oral BID   modafinil   200 mg Oral Daily   multivitamin with minerals  1 tablet Oral Daily   naproxen   250 mg Oral BID WC   nystatin    Topical TID   pantoprazole   40 mg Oral Daily   polyethylene glycol  17 g Oral Daily   predniSONE   2.5 mg Oral Q breakfast   rosuvastatin   5 mg Oral Daily   scopolamine  1 patch Transdermal Q72H   senna  2 tablet Oral BID   sodium chloride  flush  3 mL Intravenous Q12H   thiamine   100 mg Oral Daily   zinc  sulfate (50mg  elemental zinc )  220 mg Oral Daily   Continuous Infusions:  azithromycin 500 mg (01/14/24 1251)   ceFEPime  (MAXIPIME ) IV     DAPTOmycin  Stopped (01/13/24 1504)  metronidazole 500 mg (01/14/24 1435)   PRN Meds:.acetaminophen , bisacodyl , dextrose , HYDROmorphone  (DILAUDID ) injection, ipratropium-albuterol , LORazepam , melatonin, menthol -cetylpyridinium **OR** phenol, ondansetron  **OR** ondansetron  (ZOFRAN ) IV, oxyCODONE , sodium chloride  flush, sodium chloride  flush, sodium chloride  flush  Family Communication: All family at bedside  Disposition: Status is: Inpatient Remains inpatient appropriate because: Severe sepsis     Time spent: 61 minutes  Length of inpatient stay: 40 days  Author: Jodeane Mulligan, DO 01/14/2024 2:42 PM  For on call review [email or web address].

## 2024-01-14 NOTE — Progress Notes (Signed)
 RT called to bedside by RN to assess pt. RT NTS pt got out copious amounts of tan yellow thick secretions.pt tolerated well, spo2 88 RT placed pt on HFNC 7L SpO2 increased to 90,  MD notified.     01/14/24 0938  Therapy Vitals  Pulse Rate 99  Resp 18  MEWS Score/Color  MEWS Score 1  MEWS Score Color Green  Respiratory Assessment  Respiratory Pattern Regular;Labored  Chest Assessment Chest expansion symmetrical  Cough Productive  Sputum Amount Copious  Sputum Color Tan;Yellow  Sputum Consistency Thick  Sputum Specimen Source Nasal tracheal  Bilateral Breath Sounds Diminished;Rhonchi  Oxygen Therapy/Pulse Ox  O2 Device HHFNC  O2 Therapy Oxygen humidified  O2 Flow Rate (L/min) 7 L/min  SpO2 90 %

## 2024-01-14 DEATH — deceased

## 2024-01-15 DIAGNOSIS — N170 Acute kidney failure with tubular necrosis: Secondary | ICD-10-CM | POA: Diagnosis not present

## 2024-01-15 DIAGNOSIS — G9341 Metabolic encephalopathy: Secondary | ICD-10-CM | POA: Diagnosis not present

## 2024-01-15 DIAGNOSIS — Z66 Do not resuscitate: Secondary | ICD-10-CM | POA: Diagnosis not present

## 2024-01-15 DIAGNOSIS — M5440 Lumbago with sciatica, unspecified side: Secondary | ICD-10-CM | POA: Diagnosis not present

## 2024-01-15 DIAGNOSIS — R6520 Severe sepsis without septic shock: Secondary | ICD-10-CM

## 2024-01-15 DIAGNOSIS — J9601 Acute respiratory failure with hypoxia: Secondary | ICD-10-CM | POA: Diagnosis not present

## 2024-01-15 DIAGNOSIS — Z789 Other specified health status: Secondary | ICD-10-CM

## 2024-01-15 DIAGNOSIS — N182 Chronic kidney disease, stage 2 (mild): Secondary | ICD-10-CM

## 2024-01-15 DIAGNOSIS — R34 Anuria and oliguria: Secondary | ICD-10-CM | POA: Diagnosis not present

## 2024-01-15 LAB — COMPREHENSIVE METABOLIC PANEL WITH GFR
ALT: 13 U/L (ref 0–44)
AST: 28 U/L (ref 15–41)
Albumin: <1.5 g/dL — ABNORMAL LOW (ref 3.5–5.0)
Alkaline Phosphatase: 91 U/L (ref 38–126)
Anion gap: 9 (ref 5–15)
BUN: 54 mg/dL — ABNORMAL HIGH (ref 8–23)
CO2: 21 mmol/L — ABNORMAL LOW (ref 22–32)
Calcium: 6.7 mg/dL — ABNORMAL LOW (ref 8.9–10.3)
Chloride: 119 mmol/L — ABNORMAL HIGH (ref 98–111)
Creatinine, Ser: 3.15 mg/dL — ABNORMAL HIGH (ref 0.61–1.24)
GFR, Estimated: 20 mL/min — ABNORMAL LOW (ref >60)
Glucose, Bld: 110 mg/dL — ABNORMAL HIGH (ref 70–99)
Potassium: 4.7 mmol/L (ref 3.5–5.1)
Sodium: 149 mmol/L — ABNORMAL HIGH (ref 135–145)
Total Bilirubin: 0.6 mg/dL (ref 0.0–1.2)
Total Protein: 5.7 g/dL — ABNORMAL LOW (ref 6.5–8.1)

## 2024-01-15 LAB — GLUCOSE, CAPILLARY
Glucose-Capillary: 119 mg/dL — ABNORMAL HIGH (ref 70–99)
Glucose-Capillary: 123 mg/dL — ABNORMAL HIGH (ref 70–99)
Glucose-Capillary: 119 mg/dL — ABNORMAL HIGH (ref 70–99)
Glucose-Capillary: 124 mg/dL — ABNORMAL HIGH (ref 70–99)

## 2024-01-15 LAB — CBC
HCT: 23.6 % — ABNORMAL LOW (ref 39.0–52.0)
Hemoglobin: 7 g/dL — ABNORMAL LOW (ref 13.0–17.0)
MCH: 29.4 pg (ref 26.0–34.0)
MCHC: 29.7 g/dL — ABNORMAL LOW (ref 30.0–36.0)
MCV: 99.2 fL (ref 80.0–100.0)
Platelets: 198 10*3/uL (ref 150–400)
RBC: 2.38 MIL/uL — ABNORMAL LOW (ref 4.22–5.81)
RDW: 16.2 % — ABNORMAL HIGH (ref 11.5–15.5)
WBC: 15.6 10*3/uL — ABNORMAL HIGH (ref 4.0–10.5)
nRBC: 0.1 % (ref 0.0–0.2)

## 2024-01-15 LAB — PHOSPHORUS: Phosphorus: 5.9 mg/dL — ABNORMAL HIGH (ref 2.5–4.6)

## 2024-01-15 LAB — HEMOGLOBIN AND HEMATOCRIT, BLOOD
HCT: 23.1 % — ABNORMAL LOW (ref 39.0–52.0)
Hemoglobin: 6.8 g/dL — CL (ref 13.0–17.0)

## 2024-01-15 LAB — MAGNESIUM: Magnesium: 2.5 mg/dL — ABNORMAL HIGH (ref 1.7–2.4)

## 2024-01-15 LAB — PREPARE RBC (CROSSMATCH)

## 2024-01-15 MED ORDER — ALBUMIN HUMAN 25 % IV SOLN
12.5000 g | Freq: Four times a day (QID) | INTRAVENOUS | Status: AC
  Administered 2024-01-15 – 2024-01-16 (×6): 12.5 g via INTRAVENOUS
  Filled 2024-01-15 (×6): qty 50

## 2024-01-15 MED ORDER — SODIUM CHLORIDE 0.9 % IV SOLN
2.0000 g | INTRAVENOUS | Status: DC
  Administered 2024-01-16: 2 g via INTRAVENOUS
  Filled 2024-01-15: qty 12.5

## 2024-01-15 MED ORDER — ENOXAPARIN SODIUM 30 MG/0.3ML IJ SOSY
30.0000 mg | PREFILLED_SYRINGE | Freq: Every day | INTRAMUSCULAR | Status: DC
  Administered 2024-01-16: 30 mg via SUBCUTANEOUS
  Filled 2024-01-15: qty 0.3

## 2024-01-15 MED ORDER — DAPTOMYCIN-SODIUM CHLORIDE 700-0.9 MG/100ML-% IV SOLN: 8.0000 mg/kg | INTRAVENOUS | Status: DC

## 2024-01-15 MED ORDER — DAPTOMYCIN-SODIUM CHLORIDE 700-0.9 MG/100ML-% IV SOLN
8.0000 mg/kg | INTRAVENOUS | Status: DC
  Filled 2024-01-15: qty 100

## 2024-01-15 MED ORDER — SODIUM CHLORIDE 0.9% IV SOLUTION: Freq: Once | INTRAVENOUS | Status: AC

## 2024-01-15 NOTE — Progress Notes (Signed)
 Calkins MD notified of patient abnormal vital signs BP (!) 96/35 (BP Location: Left Arm)   Pulse 91   Temp (!) 102.5 F (39.2 C) (Axillary)   Resp 20   Ht 6' (1.829 m)   Wt 125.2 kg   SpO2 100%   BMI 37.43 kg/m   Immediate response to continue monitoring the patient with no new orders at this time. Patient family wishes to not have rapid response called or patient moved to higher level of care at this time. MD Macarthur Savory previously aware of family's wishes.

## 2024-01-15 NOTE — Plan of Care (Signed)

## 2024-01-15 NOTE — Progress Notes (Signed)
 Daily Progress Note   Date: 01/15/2024   Patient Name: Norman Lambert  DOB: November 03, 1950  MRN: [id number]  Age / Sex: 73 y.o., male  Attending Physician: Jodeane Mulligan, DO Primary Care Physician: Patient, No Pcp Per Admit Date: 12/05/2023 Length of Stay: 41 days  Reason for Consultation: Establishing goals of care  Past Medical History:  Diagnosis Date   Abnormal EKG    Arthritis    Essential hypertension 01/02/2017   GERD (gastroesophageal reflux disease)    Gout    Hyperlipidemia 01/02/2017   Hypertension    Hypothyroidism    Peripheral vascular disease (HCC)    RBBB 01/02/2017   Rotator cuff tear    Sleep apnea     Subjective:   Subjective: Chart Reviewed. Updates received. Patient Assessed. Created space and opportunity for patient  and family to explore thoughts and feelings regarding current medical situation.  Today's Discussion: Today before meeting with the patient/family, I reviewed the chart including SLP note from today, nursing note today her medicine note from yesterday.  I also reviewed nursing flowsheets, vitals logs, and medication ministration record but overall since the patient's condition.  I reviewed CBC showing a progressive increase in white blood cell count consistent with infection despite antibiotics, stable hemoglobin.  CMP shows that the patient is maintaining normal for several days of admission but yesterday jumped from 1.16-1.95 and then again today to 3.15 showing progressive AKI on top of other medical issues.  In summary the patient has new sepsis likely secondary to recur because of aspiration.  He has failed swallow studies and remains n.p.o.  Has had worsening hypoxia, although oxygen has decreased from 10 L/min to 6 L/min today.  He is also having increasing encephalopathy including unresponsive today likely because of sepsis.  Today I received a message from the family requesting to be to the left o'clock.  To the bedside multiple family members  were present including the patient's wife, patient's daughter, 2 grandchildren, and a grandchild on the phone via FaceTime.  We spent a substantial amount of time talking about the current clinical status.  They shared that the hospitalist earlier today said that he exactly had a slight improvement today compared to yesterday, which they noted and a decrease in his oxygen requirements.  He is receiving antibiotics and a culture is still pending along with sensitivity.  We discussed multiple setbacks the patient has had this admission.  They state that he has been gurgling, but this is been ongoing.  We talked a lot about aspiration and risk for recurrent aspiration and pneumonia.  They note that over the past 48 hours he has had a significant hip to his health.  We spent a substantial mount of time talking about advance care planning including options moving forward.  This is detailed below in the advance care planning section.  Overall, family feels that they have been pushed since admission to go to rehab.  However, they feel that he is likely not able to do rehab in his current state.  At the end of our extensive conversations they seem to be leaning towards comfort care but at this time they would like to wait for culture and sensitivity to come back to see if there is any need and change of antibiotics.  This would also allow time for outcomes and see how he does over the next 24 to 48 hours.  I shared that I would check in tomorrow to see how the patient is  doing and to check in with family for further discussions and any questions. I provided emotional and general support through therapeutic listening, empathy, sharing of stories, and other techniques. I answered all questions and addressed all concerns to the best of my ability.  Review of Systems  Unable to perform ROS: Mental status change    Objective:   Primary Diagnoses: Present on Admission:  Back pain   Vital Signs:  BP (!) 96/35 (BP  Location: Left Arm)   Pulse 91   Temp (!) 102.5 F (39.2 C) (Axillary)   Resp 20   Ht 6' (1.829 m)   Wt 125.2 kg   SpO2 100%   BMI 37.43 kg/m   Physical Exam Vitals and nursing note reviewed.  Constitutional:      General: He is sleeping. He is not in acute distress.    Appearance: He is ill-appearing. He is not toxic-appearing.     Comments: Essentially unresponsive at this time  HENT:     Head: Normocephalic and atraumatic.  Cardiovascular:     Rate and Rhythm: Normal rate.  Pulmonary:     Effort: Pulmonary effort is normal. No respiratory distress.  Abdominal:     General: Abdomen is flat.     Palpations: Abdomen is soft.  Skin:    General: Skin is warm and dry.     Palliative Assessment/Data: 10%   Advanced Care Planning:   Existing Vynca/ACP Documentation: None  Primary Decision Maker: NEXT OF KIN  Pertinent diagnosis: Severe sepsis, acute hypoxic respiratory failure and concern for arts, AKI with oliguria/anuria, acute metabolic encephalopathy, dysphagia with aspiration  The patient and/or family consented to a voluntary Advance Care Planning Conversation in person and over the phone. Individuals present for the conversation: Patient's wife [name], [name], [name], and [name].  Summary of the conversation: We had extensive discussion on the patient's current clinical status, significant decline over the past 48 hours despite mild improvement today and oxygen requirements.  We talked about path moving forward including continued aggressive care likely necessitating PEG tube versus a more comfort focused path.  Outcome of the conversations and/or documents completed: Allow 24 to 48 hours for return of culture and sensitivity, continue current scope of care and the meantime, no escalation of care, confirms DNR/DNI, ongoing goals of care as clinical picture evolves.  I spent 50 minutes providing separately identifiable ACP services with  the patient and/or surrogate decision maker in a voluntary, in-person conversation discussing the patient's wishes and goals as detailed in the above note.  Assessment & Plan:   HPI/Patient Profile:  72 y.o. male with past medical history of hypertension, hyperlipidemia, hypothyroidism, gout, sleep apnea admitted on 12/05/2023 with severe back pain with evidence of increasing kyphosis at L3-4 with retrolisthesis. S/P 4/25 reduction and internal fixation of previous fusions involving L2-S1. Hospitalization complicated by septic shock s/t Proteus bacteremia from postsurgical infection/possible pneumonia and ongoing back pain. Follow up scans with evidence of new fractures of lumbar spine thought to be fragility fractures due to osteoporosis and long-term steroids - no good options and not a good candidate for repeat surgical intervention.   SUMMARY OF RECOMMENDATIONS   DNR/DNI Continue current scope of care No escalation of scope of care Await further data in the form of culture and sensitivity Ongoing goals of care discussion pending evolution of clinical picture Palliative medicine will continue to follow  Symptom Management:  Per primary team PMT is available to assist as needed  Code Status:  DNR-limited  Prognosis: Unable to determine  Discharge Planning: To Be Determined  Discussed with: Patient's family, medical team, bedside nursing  Thank you for allowing us  to participate in the care of Norman Lambert PMT will continue to support holistically.  Billing based on MDM: High  Problems Addressed: One acute or chronic illness or injury that poses a threat to life or bodily function  Amount and/or Complexity of Data: Category 1:Review of prior external note(s) from each unique source, Review of the result(s) of each unique test, and Assessment requiring an independent historian(s) and Category 3:Discussion of management or test interpretation with external physician/other qualified  health care professional/appropriate source (not separately reported)  Risks: N/A  Detailed review of medical records (labs, imaging, vital signs), medically appropriate exam, discussed with treatment team, counseling and education to patient, family, & staff, documenting clinical information, medication management, coordination of care  Lizbeth Right, NP Palliative Medicine Team  Team Phone # [phone] (Nights/Weekends)  04/13/2021, 8:17 AM

## 2024-01-15 NOTE — Progress Notes (Addendum)
 Speech Language Pathology Treatment: Dysphagia  Patient Details Name: Norman Lambert MRN: [id number] DOB: 1950/11/07 Today's Date: 01/15/2024 Time: 7846-9629 SLP Time Calculation (min) (ACUTE ONLY): 8 min  Assessment / Plan / Recommendation Clinical Impression  Pt is now obtunded with audible secretions at baseline and is receiving daily NTS. He closed his lips around the toothbrush x1 during oral care but otherwise did not open his eyes or respond to presentations of POs. He did not initiate a labial seal or lingual movement of ice chips and all trials were removed from his oral cavity. Provided education to pt's wife re: continuing thorough oral care QID. His mentation remains a barrier to a PO diet. Given concern for progression of bilateral airspace diease on CXR 6/1, an instrumental swallow study will be necessary before initiating a PO diet. SLP will continue following for readiness to participate.     HPI HPI: Norman Lambert is a 73 yo male presenting to ED 4/22 with severe lower back pain. Underwent laminectomy L3-4 with posterior fixation L2-S1. Recovery complicated by pain and hypotension and was transferred to ICU 5/3. PMH includes gout, arthritis, HTN, HLD, PVD, RBBB, R reverse TSA, sleep apnea      SLP Plan  Continue with current plan of care      Recommendations for follow up therapy are one component of a multi-disciplinary discharge planning process, led by the attending physician.  Recommendations may be updated based on patient status, additional functional criteria and insurance authorization.    Recommendations  Diet recommendations: NPO Medication Administration: Via alternative means                  Oral care QID;Oral care prior to ice chip/H20;Staff/trained caregiver to provide oral care   Frequent or constant Supervision/Assistance Dysphagia, oropharyngeal phase (R13.12)     Continue with current plan of care     Amil Kale, M.A., CCC-SLP Speech  Language Pathology, Acute Rehabilitation Services  Secure Chat preferred [phone]   01/15/2024, 10:48 AM

## 2024-01-15 NOTE — Progress Notes (Signed)
 Progress Note   Patient: Norman Lambert QMV:[id number] DOB: May 06, 1951 DOA: 12/05/2023  DOS: the patient was seen and examined on 01/15/2024   Brief hospital course:  73 year old male with past medical history of hypertension, gout, hyperlipidemia, hypothyroidism, sleep apnea was admitted on 4/25 for reduction and internal fixation of previous fusions involving L2-S1. Postoperative course initially seemed unremarkable but subsequently he continued to have increased pain involving the left foot seemingly not responding to as needed narcotics. Imaging of the back was unremarkable, he continued to have drain VAC in place.  Subsequently on 12/15/2023 patient had hypotension with tachycardia and fever up to 103 F.  Patient was then started on IV cefepime  and vancomycin  with concern about sepsis.  There was significant wound erythema with serosanguineous foul-smelling drainage from the surgical site.  Patient had Levophed  briefly but subsequently was weaned off    Significant hospital events 4/25 Extensive L2-S1 lumbar surgery 4/30 New onset pain  5/1 Ongoing pain with borderline blood pressure 5/2 Pain, leukocytosis fever, 5/3 Hypotension, not responding to crystalloid started on broad-spectrum antibiotics wound assessed by surgical team culture sent imaging pending ICU transfer. CT imaging of lumbar spine was negative for definitive abscess there were stable fractures at L3 and L4 with possible new fracture involving the transverse process of L2 and L3 on the left postoperative changes noted.CT angio of chest was negative for pulmonary emboli had bibasilar airspace disease the right was a little worse than the left consistent with possible pneumonia there was aortic aneurysmal dilation of the ascending aorta measuring at 4.5 cm 5/4 Weaned off norepinephrine , Blood cultures from 1 out of 2 samples growing gram-negative rods with BC ID showing both proteus and Enterobacterales, MRSA PCR negative. Narrowed to  ceftriaxone . 5/5 Remains on low dose NE. Febrile. BCx growing proteus.  5/6 Wound Cx with proteus, klebsiella aerogenes. Abx re-broadened to cefepime . 5/8 Required low dose norepi overnight but off this AM. His wound vac was changed this AM and has been having some pain 5/9 no issues overnight, denies any acute pain this a.m. 5/14-continues to have back pain. 5/17-has been advised bedrest for back pain.   5/19-removal of wound VAC.  PT evaluation. 5/20-concerns for aspiration show speech therapy was consulted,. for modified barium swallow. 5/21-speech therapy recommend regular diet. 5/27: Hemodynamically stable, still having significant pain, family wants SNF near [place]-TOC is working on it.  Mentation improved 5/28 - Mild aspiration with feeding again.  Made NPO 5/29 - resume diet per SLP. 5/30 - npo again 6/1 - becoming septic.  Encephalopathic, hypotensive, fever 100.5, oliguric.  Family discussion about goals of care, no ICU if it comes to it.   Assessment and Plan:   New episode of severe sepsis - On 6/1, worsening encephalopathy, oliguria, hypotension, fever of 100.5.  Likely source aspiration however unclear given patient's chronic broad-spectrum antibiotic use.  Blood cultures ordered, IV fluid bolus ordered.  IV Flagyl and IV azithromycin added to cefepime  plus daptomycin .  Continue hydrocortisone  IV 50mg  q6hr as well. Blood pressure appears stable for now, SBP 90-100s.  Will continue to monitor closely.   Acute hypoxic respiratory failure with concern for ARDS - Currently requiring 6L nasal cannula.  Stat chest x-ray ordered and personally reviewed noting worsening bilateral patchiness suggestive of likely worsening aspirations or vascular congestion.  Rapid COVID/flu/RSV negative.  Rapid respiratory pathogen profile negative.  Strep pneumo urine antigen negative.  Legionella Ur antigen pending.  Continue to supplement O2 as above.  Attempt to wean as  tolerated.     Acute  kidney injury with oliguria/anuria - Noted oliguria beginning 5/31 evening.  Minimal output overnight.  Foley catheter inserted 6/1.  Likely secondary to severe sepsis.  Increased IV Bumex 2 mg to every 8 hours.  IV albumin  every 6 hours.  Monitor I's and O's closely.  Will recheck BMP and mag in AM.  No indication for dialysis at this time.  Will monitor his acidemia, electrolytes, volume status very closely.   Acute metabolic encephalopathy - Much worse this morning.  Mostly lethargic.  Likely result of sepsis.  Continue to monitor closely.   Dysphagia with aspiration - Had another aspiration event 5/28 with feeding, increased coughing, noting worsening right lower lobe patchiness.  6/1 acutely worsening hypoxia.  Likely etiology of patient's sepsis above.  Patient already on antibiotics as above.  Attempted esophagram however patient was too sedated.  Continues to fail swallow eval's, currently NPO.  If continues to be NPO, may need to discuss NG tube for nutrition.   Sepsis secondary to postop wound infection - 5/3 noted bacteremia with Klebsiella and Proteus.  Antibiotic regimen followed closely by infectious disease.  Original plan per ID was to receive cefepime  and daptomycin  for 4 weeks (through 6/5), transition to oral Levaquin  and linezolid  6/6 x 2 weeks.     L3-L4 laminectomy redo/acute on chronic L2-L5 fracture/osteoporosis/back pain - Followed closely by neurosurgery.  Antibiotic therapy as above.  PT on board.   Acute normocytic anemia - Hgb 7.0 this am, likely exacerbated by aggressive volume resuscitation yesterday.  Right at the border transfusion.  Will recheck hemoglobin later this afternoon.  Will transfuse if less than 7.  Will recheck CBC in AM.   Diabetes mellitus - Hypoglycemia resolved.  Insulin  sliding scale on board.   Hypokalemia - Now resolved after replenishment.  Will recheck BMP and mag in AM.   Mild hypernatremia - Continues to have mild free water deficit  despite aggressive isotonic resuscitation yesterday.  Holding off on fluids given oliguria.  Recheck BMP in AM.   Paroxysmal atrial fibrillation - CHA2DS2-VASc score 3.  Likely exacerbated by infection.  Currently normal sinus rhythm.  Anticoagulation/antiplatelets deferred to neurosurgery team.   GERD/BPH/hypothyroidism - Continue regimen   Hypertension - Now hypotensive as above.   Goals of care - Patient with acute decompensation with septic appearance 6/1.  Noted worsening encephalopathy, hypotension, decreased urine output, fever, hypoxia.  Had a frank discussion about the patient's status, prognosis, goals of care with the patient's wife and multiple family members at bedside.  At this time, as the the patient is not requiring vasopressor medications nor needing intubation, the family is willing to continue somewhat aggressive treatment and an effort to have the patient bounce back.  However should the patient continue to decline, warrant vasopressor medications, intubation, or dialysis, would agree discusses care plan.  Patient's wife had stated that she would not want him to suffer.  Would likely pursue comfort care should he decompensate to that degree given his worsening functional status, quality of life.  Will continue to monitor patient closely and update the family.   Subjective: Patient resting comfortably this morning, still encephalopathic.  Continues on 6 L nasal cannula.  Trace urine output.  Wife and other family members at bedside, others via telephone/video call.  Updated all on his current status, prognosis, new lab results, plan for today.  All questions answered and concerns addressed.  Physical Exam:  Vitals:   01/15/24 0434 01/15/24 0745 01/15/24  0746 01/15/24 1119  BP: (!) 102/41 (!) 101/43  (!) 96/35  Pulse: 90 91    Resp: 18 18  20   Temp: 99.1 F (37.3 C) 99.7 F (37.6 C)  (!) 102.5 F (39.2 C)  TempSrc: Oral Oral  Axillary  SpO2: 99% 100% 100%   Weight:       Height:        GENERAL: Encephalopathic, ill-appearing HEENT: Nasal cannula CARDIOVASCULAR: Regular and tachycardic RESPIRATORY: Coarse breath sounds, mildly tachypneic/dyspneic GASTROINTESTINAL:  Soft, nontender, nondistended EXTREMITIES:  No LE edema bilaterally NEURO:  No new focal deficits appreciated SKIN:  No rashes noted PSYCH: Encephalopathic   Data Reviewed:  No new imaging review  Previous records (including but not limited to H&P, progress notes, nursing notes, TOC management) were reviewed in assessment of this patient.  Labs: CBC: Recent Labs  Lab 01/11/24 0405 01/12/24 0515 01/13/24 0319 01/14/24 0800 01/15/24 0328  WBC 8.5 10.1 13.3* 12.1* 15.6*  HGB 7.2* 7.8* 7.7* 7.6* 7.0*  HCT 23.7* 25.0* 25.3* 24.9* 23.6*  MCV 96.7 94.3 94.4 96.5 99.2  PLT 197 225 223 229 198   Basic Metabolic Panel: Recent Labs  Lab 01/11/24 1436 01/12/24 0515 01/13/24 0319 01/14/24 0800 01/15/24 0328  NA 143 143 146* 148* 149*  K 3.4* 3.2* 3.7 4.0 4.7  CL 113* 114* 115* 120* 119*  CO2 22 19* 20* 20* 21*  GLUCOSE 108* 88 95 109* 110*  BUN 25* 28* 34* 45* 54*  CREATININE 0.91 0.97 1.16 1.95* 3.15*  CALCIUM  7.6* 7.6* 7.6* 7.1* 6.7*  MG 2.1 2.2 2.3 2.4 2.5*  PHOS  --   --   --   --  5.9*   Liver Function Tests: Recent Labs  Lab 01/14/24 0800 01/15/24 0328  AST 23 28  ALT 12 13  ALKPHOS 87 91  BILITOT 0.6 0.6  PROT 5.6* 5.7*  ALBUMIN  <1.5* <1.5*   CBG: Recent Labs  Lab 01/14/24 1255 01/14/24 1658 01/14/24 2051 01/15/24 0755 01/15/24 1244  GLUCAP 93 72 97 119* 123*    Scheduled Meds:  (feeding supplement) PROSource Plus  30 mL Oral BID BM   vitamin C   1,000 mg Oral Daily   bumetanide (BUMEX) IV  2 mg Intravenous Q8H   calcitonin (salmon)  1 spray Alternating Nares Daily   calcium  carbonate  1 tablet Oral Q breakfast   Chlorhexidine  Gluconate Cloth  6 each Topical Daily   cholecalciferol   1,000 Units Oral Daily   cyanocobalamin   1,000 mcg Oral Daily    enoxaparin  (LOVENOX ) injection  40 mg Subcutaneous Daily   feeding supplement  237 mL Oral BID BM   ferrous sulfate   325 mg Oral Q breakfast   finasteride   5 mg Oral Daily   folic acid   1 mg Oral Daily   hydrocortisone  sod succinate (SOLU-CORTEF ) inj  100 mg Intravenous Q12H   ipratropium-albuterol   3 mL Nebulization Q6H   [START ON 01/19/2024] levofloxacin   750 mg Oral Daily   levothyroxine   112 mcg Oral Q0600   [START ON 01/19/2024] linezolid   600 mg Oral Q12H   loratadine   10 mg Oral Daily   magnesium  oxide  400 mg Oral BID   modafinil   200 mg Oral Daily   multivitamin with minerals  1 tablet Oral Daily   naproxen   250 mg Oral BID WC   nystatin    Topical TID   pantoprazole   40 mg Oral Daily   polyethylene glycol  17 g Oral Daily   predniSONE   2.5 mg Oral Q breakfast   rosuvastatin   5 mg Oral Daily   scopolamine  1 patch Transdermal Q72H   senna  2 tablet Oral BID   sodium chloride  flush  3 mL Intravenous Q12H   thiamine   100 mg Oral Daily   zinc  sulfate (50mg  elemental zinc )  220 mg Oral Daily   Continuous Infusions:  albumin  human 12.5 g (01/15/24 0912)   azithromycin 500 mg (01/15/24 1102)   [START ON 01/16/2024] ceFEPime  (MAXIPIME ) IV     DAPTOmycin  700 mg (01/14/24 1702)   metronidazole 500 mg (01/15/24 0323)   PRN Meds:.acetaminophen , bisacodyl , dextrose , HYDROmorphone  (DILAUDID ) injection, ipratropium-albuterol , LORazepam , melatonin, menthol -cetylpyridinium **OR** phenol, ondansetron  **OR** ondansetron  (ZOFRAN ) IV, oxyCODONE , sodium chloride  flush, sodium chloride  flush, sodium chloride  flush  Family Communication: Wife and other family members at bedside  Disposition: Status is: Inpatient Remains inpatient appropriate because: Severe sepsis     Time spent: 55 minutes  Length of inpatient stay: 41 days  Author: Jodeane Mulligan, DO 01/15/2024 1:02 PM  For on call review [email or web address].

## 2024-01-15 NOTE — Progress Notes (Signed)
 [facility] night cross cover note:   Updated blood cx's x 2 sets were ordered for recurrent objective fever in setting of ORIF wound infxn complicated by Proteus bacteremia.    Camelia Cavalier, DO Hospitalist

## 2024-01-16 DIAGNOSIS — Z789 Other specified health status: Secondary | ICD-10-CM | POA: Diagnosis not present

## 2024-01-16 DIAGNOSIS — N170 Acute kidney failure with tubular necrosis: Secondary | ICD-10-CM | POA: Insufficient documentation

## 2024-01-16 DIAGNOSIS — R34 Anuria and oliguria: Secondary | ICD-10-CM

## 2024-01-16 DIAGNOSIS — Z515 Encounter for palliative care: Secondary | ICD-10-CM | POA: Diagnosis not present

## 2024-01-16 DIAGNOSIS — J690 Pneumonitis due to inhalation of food and vomit: Secondary | ICD-10-CM

## 2024-01-16 DIAGNOSIS — Z66 Do not resuscitate: Secondary | ICD-10-CM | POA: Diagnosis not present

## 2024-01-16 DIAGNOSIS — J9601 Acute respiratory failure with hypoxia: Secondary | ICD-10-CM | POA: Diagnosis not present

## 2024-01-16 DIAGNOSIS — D62 Acute posthemorrhagic anemia: Secondary | ICD-10-CM | POA: Diagnosis not present

## 2024-01-16 DIAGNOSIS — G9341 Metabolic encephalopathy: Secondary | ICD-10-CM | POA: Diagnosis not present

## 2024-01-16 DIAGNOSIS — Z7189 Other specified counseling: Secondary | ICD-10-CM | POA: Diagnosis not present

## 2024-01-16 LAB — TYPE AND SCREEN
ABO/RH(D): O POS
Antibody Screen: NEGATIVE
Unit division: 0
Unit division: 0

## 2024-01-16 LAB — CBC
HCT: 27.7 % — ABNORMAL LOW (ref 39.0–52.0)
Hemoglobin: 8.4 g/dL — ABNORMAL LOW (ref 13.0–17.0)
MCH: 29.2 pg (ref 26.0–34.0)
MCHC: 30.3 g/dL (ref 30.0–36.0)
MCV: 96.2 fL (ref 80.0–100.0)
Platelets: 176 10*3/uL (ref 150–400)
RBC: 2.88 MIL/uL — ABNORMAL LOW (ref 4.22–5.81)
RDW: 17.2 % — ABNORMAL HIGH (ref 11.5–15.5)
WBC: 13.8 10*3/uL — ABNORMAL HIGH (ref 4.0–10.5)
nRBC: 0.3 % — ABNORMAL HIGH (ref 0.0–0.2)

## 2024-01-16 LAB — BASIC METABOLIC PANEL WITH GFR
Anion gap: 11 (ref 5–15)
BUN: 83 mg/dL — ABNORMAL HIGH (ref 8–23)
CO2: 18 mmol/L — ABNORMAL LOW (ref 22–32)
Calcium: 6.5 mg/dL — ABNORMAL LOW (ref 8.9–10.3)
Chloride: 121 mmol/L — ABNORMAL HIGH (ref 98–111)
Creatinine, Ser: 5.27 mg/dL — ABNORMAL HIGH (ref 0.61–1.24)
GFR, Estimated: 11 mL/min — ABNORMAL LOW (ref >60)
Glucose, Bld: 166 mg/dL — ABNORMAL HIGH (ref 70–99)
Potassium: 5.7 mmol/L — ABNORMAL HIGH (ref 3.5–5.1)
Sodium: 150 mmol/L — ABNORMAL HIGH (ref 135–145)

## 2024-01-16 LAB — BPAM RBC
Blood Product Expiration Date: 202506302359
Blood Product Expiration Date: 202506302359
ISSUE DATE / TIME: 202506021718
ISSUE DATE / TIME: 202506022107
Unit Type and Rh: 5100
Unit Type and Rh: 5100

## 2024-01-16 LAB — GLUCOSE, CAPILLARY
Glucose-Capillary: 114 mg/dL — ABNORMAL HIGH (ref 70–99)
Glucose-Capillary: 110 mg/dL — ABNORMAL HIGH (ref 70–99)
Glucose-Capillary: 166 mg/dL — ABNORMAL HIGH (ref 70–99)
Glucose-Capillary: 215 mg/dL — ABNORMAL HIGH (ref 70–99)
Glucose-Capillary: 149 mg/dL — ABNORMAL HIGH (ref 70–99)
Glucose-Capillary: 127 mg/dL — ABNORMAL HIGH (ref 70–99)
Glucose-Capillary: 161 mg/dL — ABNORMAL HIGH (ref 70–99)
Glucose-Capillary: 160 mg/dL — ABNORMAL HIGH (ref 70–99)

## 2024-01-16 LAB — COMPREHENSIVE METABOLIC PANEL WITH GFR
ALT: 72 U/L — ABNORMAL HIGH (ref 0–44)
AST: 313 U/L — ABNORMAL HIGH (ref 15–41)
Albumin: 1.8 g/dL — ABNORMAL LOW (ref 3.5–5.0)
Alkaline Phosphatase: 81 U/L (ref 38–126)
Anion gap: 10 (ref 5–15)
BUN: 69 mg/dL — ABNORMAL HIGH (ref 8–23)
CO2: 17 mmol/L — ABNORMAL LOW (ref 22–32)
Calcium: 6.4 mg/dL — CL (ref 8.9–10.3)
Chloride: 124 mmol/L — ABNORMAL HIGH (ref 98–111)
Creatinine, Ser: 4.88 mg/dL — ABNORMAL HIGH (ref 0.61–1.24)
GFR, Estimated: 12 mL/min — ABNORMAL LOW (ref >60)
Glucose, Bld: 119 mg/dL — ABNORMAL HIGH (ref 70–99)
Potassium: 5.4 mmol/L — ABNORMAL HIGH (ref 3.5–5.1)
Sodium: 151 mmol/L — ABNORMAL HIGH (ref 135–145)
Total Bilirubin: 1 mg/dL (ref 0.0–1.2)
Total Protein: 6 g/dL — ABNORMAL LOW (ref 6.5–8.1)

## 2024-01-16 LAB — CULTURE, RESPIRATORY W GRAM STAIN

## 2024-01-16 LAB — PHOSPHORUS: Phosphorus: 6.8 mg/dL — ABNORMAL HIGH (ref 2.5–4.6)

## 2024-01-16 LAB — MAGNESIUM
Magnesium: 2.8 mg/dL — ABNORMAL HIGH (ref 1.7–2.4)
Magnesium: 2.9 mg/dL — ABNORMAL HIGH (ref 1.7–2.4)

## 2024-01-16 MED ORDER — LINEZOLID 600 MG/300ML IV SOLN
600.0000 mg | Freq: Two times a day (BID) | INTRAVENOUS | Status: DC
  Administered 2024-01-16: 600 mg via INTRAVENOUS
  Filled 2024-01-16 (×2): qty 300

## 2024-01-16 MED ORDER — OSMOLITE 1.5 CAL PO LIQD
1000.0000 mL | ORAL | Status: DC
  Filled 2024-01-16: qty 1000

## 2024-01-16 MED ORDER — SODIUM POLYSTYRENE SULFONATE 15 GM/60ML CO SUSP
30.0000 g | Freq: Once | Status: DC
  Filled 2024-01-16: qty 120

## 2024-01-16 MED ORDER — SODIUM CHLORIDE 0.9% FLUSH: 3.0000 mL | INTRAVENOUS | Status: DC | PRN

## 2024-01-16 MED ORDER — IPRATROPIUM-ALBUTEROL 0.5-2.5 (3) MG/3ML IN SOLN
3.0000 mL | Freq: Two times a day (BID) | RESPIRATORY_TRACT | Status: DC
  Filled 2024-01-16: qty 3

## 2024-01-16 MED ORDER — IPRATROPIUM-ALBUTEROL 0.5-2.5 (3) MG/3ML IN SOLN
3.0000 mL | RESPIRATORY_TRACT | Status: DC | PRN
Start: 2024-01-16 — End: 2024-01-17

## 2024-01-16 MED ORDER — INSULIN ASPART 100 UNIT/ML IV SOLN: 10.0000 [IU] | Freq: Once | INTRAVENOUS | Status: DC

## 2024-01-16 MED ORDER — DEXTROSE 50 % IV SOLN
1.0000 | Freq: Once | INTRAVENOUS | Status: AC
  Administered 2024-01-16: 50 mL via INTRAVENOUS
  Filled 2024-01-16: qty 50

## 2024-01-16 MED ORDER — SODIUM BICARBONATE 4.2 % IV SOLN
50.0000 meq | Freq: Once | INTRAVENOUS | Status: DC
  Filled 2024-01-16: qty 100

## 2024-01-16 MED ORDER — PROSOURCE TF20 ENFIT COMPATIBL EN LIQD: 60.0000 mL | Freq: Every day | ENTERAL | Status: DC

## 2024-01-16 MED ORDER — INSULIN ASPART 100 UNIT/ML IV SOLN
10.0000 [IU] | Freq: Once | INTRAVENOUS | Status: AC
  Administered 2024-01-16: 10 [IU] via INTRAVENOUS

## 2024-01-16 MED ORDER — SODIUM CHLORIDE 0.9 % IV BOLUS
500.0000 mL | Freq: Once | INTRAVENOUS | Status: AC
  Administered 2024-01-16: 500 mL via INTRAVENOUS

## 2024-01-16 MED ORDER — DEXTROSE 5 % IV SOLN: INTRAVENOUS | Status: DC

## 2024-01-16 MED ORDER — GLYCOPYRROLATE 1 MG PO TABS: 1.0000 mg | ORAL_TABLET | ORAL | Status: DC | PRN

## 2024-01-16 MED ORDER — DEXTROSE 50 % IV SOLN: 1.0000 | Freq: Once | INTRAVENOUS | Status: DC

## 2024-01-16 MED ORDER — THIAMINE MONONITRATE 100 MG PO TABS: 100.0000 mg | ORAL_TABLET | Freq: Every day | ORAL | Status: DC

## 2024-01-16 MED ORDER — CALCIUM GLUCONATE-NACL 1-0.675 GM/50ML-% IV SOLN
1.0000 g | Freq: Once | INTRAVENOUS | Status: AC
  Administered 2024-01-16: 1000 mg via INTRAVENOUS
  Filled 2024-01-16: qty 50

## 2024-01-16 MED ORDER — SODIUM BICARBONATE 8.4 % IV SOLN: 50.0000 meq | Freq: Once | INTRAVENOUS | Status: DC

## 2024-01-16 MED ORDER — SODIUM CHLORIDE 0.9% FLUSH: 3.0000 mL | Freq: Two times a day (BID) | INTRAVENOUS | Status: DC

## 2024-01-16 MED ORDER — PROSOURCE TF20 ENFIT COMPATIBL EN LIQD: 60.0000 mL | Freq: Two times a day (BID) | ENTERAL | Status: DC

## 2024-01-16 MED ORDER — VITAL HIGH PROTEIN PO LIQD
1000.0000 mL | ORAL | Status: DC
  Filled 2024-01-16: qty 1000

## 2024-01-16 MED ORDER — ACETAMINOPHEN 325 MG PO TABS
650.0000 mg | ORAL_TABLET | Freq: Four times a day (QID) | ORAL | Status: DC | PRN
Start: 2024-01-16 — End: 2024-01-17

## 2024-01-16 MED ORDER — GLYCOPYRROLATE 0.2 MG/ML IJ SOLN: 0.2000 mg | INTRAMUSCULAR | Status: DC | PRN

## 2024-01-16 MED ORDER — SODIUM BICARBONATE 8.4 % IV SOLN
50.0000 meq | Freq: Once | INTRAVENOUS | Status: AC
  Administered 2024-01-16: 50 meq via INTRAVENOUS
  Filled 2024-01-16: qty 100
  Filled 2024-01-16: qty 50

## 2024-01-16 MED ORDER — ACETAMINOPHEN 650 MG RE SUPP: 650.0000 mg | Freq: Four times a day (QID) | RECTAL | Status: DC | PRN

## 2024-01-16 MED ORDER — GLYCOPYRROLATE 0.2 MG/ML IJ SOLN
0.2000 mg | INTRAMUSCULAR | Status: DC | PRN
  Administered 2024-01-17: 0.2 mg via INTRAVENOUS
  Filled 2024-01-16 (×2): qty 1

## 2024-01-16 MED ORDER — MORPHINE SULFATE (PF) 2 MG/ML IV SOLN
2.0000 mg | INTRAVENOUS | Status: DC | PRN
  Administered 2024-01-17: 2 mg via INTRAVENOUS
  Filled 2024-01-16: qty 1

## 2024-01-16 MED ORDER — LORAZEPAM 2 MG/ML IJ SOLN
2.0000 mg | INTRAMUSCULAR | Status: DC | PRN
  Administered 2024-01-17: 4 mg via INTRAVENOUS
  Filled 2024-01-16: qty 2

## 2024-01-16 MED ORDER — DEXTROSE 5 % IV SOLN: INTRAVENOUS | Status: AC

## 2024-01-16 NOTE — Progress Notes (Signed)
 Daily Progress Note   Date: 01/16/2024   Patient Name: Norman Lambert  DOB: 10-30-1950  MRN: [id number]  Age / Sex: 73 y.o., male  Attending Physician: Jodeane Mulligan, DO Primary Care Physician: Patient, No Pcp Per Admit Date: 12/05/2023 Length of Stay: 42 days  Reason for Consultation: Establishing goals of care  Past Medical History:  Diagnosis Date   Abnormal EKG    Arthritis    Essential hypertension 01/02/2017   GERD (gastroesophageal reflux disease)    Gout    Hyperlipidemia 01/02/2017   Hypertension    Hypothyroidism    Peripheral vascular disease (HCC)    RBBB 01/02/2017   Rotator cuff tear    Sleep apnea     Subjective:   Subjective: Chart Reviewed. Updates received. Patient Assessed. Created space and opportunity for patient  and family to explore thoughts and feelings regarding current medical situation.  Today's Discussion: Today before meeting with the patient/family, I reviewed the chart including internal medicine note from today, PT note from today.  Later in the day I also reviewed updated internal medicine note from today.  Reviewed labs including CBC showing mild improvement in leukocytosis to 13.8 today on antibiotics, and significant for hemoglobin of 6.8 yesterday status post 2 units now improved appropriately to 8.4 today.  Noted hypocalcemia at 6.4, currently treated by hospitalist overnight.  Ongoing and progressive decline in kidney function with creatinine now 4.88 (3.15 yesterday).  Also noted hyperkalemia of 5.4, consistent with AKI.  Now with bump in LFTs from AST/ALT of 28/13 yesterday to 313/72 today.  I also reviewed nursing flowsheets and assessments showing continued unresponsiveness in the setting of severe sepsis and illness, apparent multisystem organ failure.  Today went to the bedside, wife is present.  Dr. Macarthur Savory with the hospitalist service and Michaell Adolph, RN were also present.  Dr. Macarthur Savory spent a good amount of time explaining the clinical  picture, situation, prognosis and current treatments.  We spent time talk about options forward including continued current scope of care with limitations previously placed (DNR, no transfer to ICU, no escalation of care) versus escalation of care versus comfort care.  After discussion patient's family has elected to continue conservative management including insulin  protocol for hyperkalemia, continue oxygen, fluids, antibiotics and continue to assess and update over the next 24 hours.  Family is also clear that they would not want surgical intervention or lumbar puncture for further attempts to identify source.  I also spent time taking on family and how they are managing emotionally.  Family seems to be doing well, they are supporting each other and leaning on support systems.  We talked about ongoing goals of care conversations as the patient clinically progresses.  We would continue to update and support as needed.  I shared that I would check in tomorrow to see how the patient is doing and to check in with family for further discussions and any questions. I provided emotional and general support through therapeutic listening, empathy, sharing of stories, and other techniques. I answered all questions and addressed all concerns to the best of my ability.  Review of Systems  Unable to perform ROS: Mental status change    Objective:   Primary Diagnoses: Present on Admission:  Back pain   Vital Signs:  BP (!) 100/43 (BP Location: Left Arm)   Pulse 86   Temp 99.6 F (37.6 C)   Resp 16   Ht 6' (1.829 m)   Wt 125.2 kg  SpO2 98%   BMI 37.43 kg/m   Physical Exam Vitals and nursing note reviewed.  Constitutional:      General: He is sleeping. He is not in acute distress.    Appearance: He is ill-appearing. He is not toxic-appearing.     Comments: Essentially unresponsive at this time  HENT:     Head: Normocephalic and atraumatic.  Cardiovascular:     Rate and Rhythm: Normal rate.   Pulmonary:     Effort: Pulmonary effort is normal. No respiratory distress.  Abdominal:     General: Abdomen is flat.     Palpations: Abdomen is soft.  Skin:    General: Skin is warm and dry.     Palliative Assessment/Data: 10%   Assessment & Plan:   HPI/Patient Profile:  73 y.o. male with past medical history of hypertension, hyperlipidemia, hypothyroidism, gout, sleep apnea admitted on 12/05/2023 with severe back pain with evidence of increasing kyphosis at L3-4 with retrolisthesis. S/P 4/25 reduction and internal fixation of previous fusions involving L2-S1. Hospitalization complicated by septic shock s/t Proteus bacteremia from postsurgical infection/possible pneumonia and ongoing back pain. Follow up scans with evidence of new fractures of lumbar spine thought to be fragility fractures due to osteoporosis and long-term steroids - no good options and not a good candidate for repeat surgical intervention.   SUMMARY OF RECOMMENDATIONS   DNR/DNI Continue current scope of care, conservative management No escalation of scope of care Time for outcomes Ongoing goals of care discussion pending evolution of clinical picture Palliative medicine will follow-up tomorrow  Symptom Management:  Per primary team PMT is available to assist as needed  Code Status: DNR-limited  Prognosis: Unable to determine  Discharge Planning: To Be Determined  Discussed with: Patient's family, medical team, bedside nursing   _____________________________________ **ADDENDUM: Later in the day I received a message from Dr. Macarthur Savory that hyperkalemia continues to worsen despite treatment.  He offered that he would be returning to the patient's room to further discuss along with Tracie Fried, RN who is bedside nurse today.  I shared that unfortunately I am off service and unable to participate.  I received a follow-up message indicating that family has decided to transition to comfort care as further attempts  at management are temporary without permanent solutions.  Overall prognosis is grave.  I shared that I would continue to follow for symptom management and comfort care.** _____________________________________  Thank you for allowing us  to participate in the care of Norman Lambert PMT will continue to support holistically.  Billing based on MDM: High  Problems Addressed: One acute or chronic illness or injury that poses a threat to life or bodily function  Amount and/or Complexity of Data: Category 1:Review of prior external note(s) from each unique source, Review of the result(s) of each unique test, and Assessment requiring an independent historian(s) and Category 3:Discussion of management or test interpretation with external physician/other qualified health care professional/appropriate source (not separately reported)  Risks: N/A  Detailed review of medical records (labs, imaging, vital signs), medically appropriate exam, discussed with treatment team, counseling and education to patient, family, & staff, documenting clinical information, medication management, coordination of care  Lizbeth Right, NP Palliative Medicine Team  Team Phone # [phone] (Nights/Weekends)  04/13/2021, 8:17 AM

## 2024-01-16 NOTE — Progress Notes (Signed)
 PT Cancellation Note  Patient Details Name: Norman Lambert MRN: [id number] DOB: [birth year]   Cancelled Treatment:    Reason Eval/Treat Not Completed: (P) Patient not medically ready (Per RN pt not appropriate for therapies at this time, also MD and palliative along with family having GOC discussions today.) Will continue efforts per PT plan of care if medically appropriate as schedule permits.   [name] [name] 01/16/2024, 11:00 AM

## 2024-01-16 NOTE — Progress Notes (Signed)
 Pt appears to be comfortable at this time. Respirations are not labored but some wheezing noted. Family surrounding him at bedside. Snacks and drinks offered. Will continue to monitor.

## 2024-01-16 NOTE — Progress Notes (Signed)
 [facility] night cross cover note:   I was notified by the patient's RN of critical calcium  level this morning of 6.4.  Mag is 2.8.  Following adjustment for hypoalbuminemia, calcium  level calculated to be approximately 8.2.  I subsequently ordered calcium  gluconate 1 g IV over 1 hour x 1 dose now.     Camelia Cavalier, DO Hospitalist

## 2024-01-16 NOTE — Progress Notes (Signed)
 Progress Note   Patient: Norman Lambert ZOX:[id number] DOB: 01-05-1951 DOA: 12/05/2023  DOS: the patient was seen and examined on 01/16/2024   Brief hospital course:  73 year old male with past medical history of hypertension, gout, hyperlipidemia, hypothyroidism, sleep apnea was admitted on 4/25 for reduction and internal fixation of previous fusions involving L2-S1. Postoperative course initially seemed unremarkable but subsequently he continued to have increased pain involving the left foot seemingly not responding to as needed narcotics. Imaging of the back was unremarkable, he continued to have drain VAC in place.  Subsequently on 12/15/2023 patient had hypotension with tachycardia and fever up to 103 F.  Patient was then started on IV cefepime  and vancomycin  with concern about sepsis.  There was significant wound erythema with serosanguineous foul-smelling drainage from the surgical site.  Patient had Levophed  briefly but subsequently was weaned off    Significant hospital events 4/25 Extensive L2-S1 lumbar surgery 4/30 New onset pain  5/1 Ongoing pain with borderline blood pressure 5/2 Pain, leukocytosis fever, 5/3 Hypotension, not responding to crystalloid started on broad-spectrum antibiotics wound assessed by surgical team culture sent imaging pending ICU transfer. CT imaging of lumbar spine was negative for definitive abscess there were stable fractures at L3 and L4 with possible new fracture involving the transverse process of L2 and L3 on the left postoperative changes noted.CT angio of chest was negative for pulmonary emboli had bibasilar airspace disease the right was a little worse than the left consistent with possible pneumonia there was aortic aneurysmal dilation of the ascending aorta measuring at 4.5 cm 5/4 Weaned off norepinephrine , Blood cultures from 1 out of 2 samples growing gram-negative rods with BC ID showing both proteus and Enterobacterales, MRSA PCR negative. Narrowed to  ceftriaxone . 5/5 Remains on low dose NE. Febrile. BCx growing proteus.  5/6 Wound Cx with proteus, klebsiella aerogenes. Abx re-broadened to cefepime . 5/8 Required low dose norepi overnight but off this AM. His wound vac was changed this AM and has been having some pain 5/9 no issues overnight, denies any acute pain this a.m. 5/14-continues to have back pain. 5/17-has been advised bedrest for back pain.   5/19-removal of wound VAC.  PT evaluation. 5/20-concerns for aspiration show speech therapy was consulted,. for modified barium swallow. 5/21-speech therapy recommend regular diet. 5/27: Hemodynamically stable, still having significant pain, family wants SNF near [place]-TOC is working on it.  Mentation improved 5/28 - Mild aspiration with feeding again.  Made NPO 5/29 - resume diet per SLP. 5/30 - npo again 6/1 - becoming septic.  Encephalopathic, hypotensive, fever 100.5, oliguric.  Family discussion about goals of care, no ICU if it comes to it. 6/3 - Remains septic, febrile, oliguric.  No desire to transfer to ICU/progressive. Family waiting to see how conservative treatment progresses.   Assessment and Plan:   New episode of severe sepsis - On 6/1, worsening encephalopathy, oliguria, hypotension, fever of 100.5.  Likely source aspiration however unclear given patient's chronic broad-spectrum antibiotic use.  Blood cultures reordered again 6/3.  DC 6/1 NGTD.  Received multiple IV boluses.  IV Flagyl and IV azithromycin added to cefepime  plus daptomycin .  Will change daptomycinto linezolid  today.  Continue hydrocortisone  IV 50mg  q6hr.  Blood pressure appears stable for now, SBP 90-100s.  Will continue to monitor closely.   Acute hypoxic respiratory failure with concern for ARDS - Currently requiring 6L nasal cannula.  Chest x-ray 6/1 noting worsening bilateral patchiness suggestive of likely worsening aspirations or vascular congestion.  Rapid COVID/flu/RSV negative.  Rapid respiratory  pathogen profile negative.  Strep pneumo urine antigen negative.  Legionella Ur antigen pending.  Abx as above.  Continue to supplement O2 as above.  Attempt to wean as tolerated.     Acute kidney injury with oliguria/anuria and ATN - Noted oliguria beginning 5/31 evening.  Zero urine output overnight again.  Foley catheter inserted 6/1.  Likely secondary to severe sepsis.  Continue IV Bumex 2 mg every 8 hours.  IV albumin  every 6 hours.  Monitor I's and O's closely.  Will recheck BMP and mag in AM.  No indication for dialysis at this time.  Will monitor his acidemia, electrolytes, volume status very closely.  Hyperkalemia -Worsening this morning, likely secondary to above.  Ordered D50/IV insulin .  Unfortunately no viable option for ongoing K removal (no urine output, no feeding/ng tube, not pursuing dialysis).  Will recheck BMP and mag later this afternoon.    Mild hypernatremia - Continues to have mild free water deficit despite aggressive isotonic resuscitation previously.  Respiratory status stable, so will proceed with NS bolus and then D5W x 500ml.  Recheck BMP later this afternoon and in am.  Will continue to be cautious with fluid administration given anuria/oliguria.   Acute metabolic encephalopathy - Much worse this morning.  Mostly lethargic.  Likely result of sepsis.  Continue to monitor closely.   Dysphagia with aspiration - Had another aspiration event 5/28 with feeding, increased coughing, noting worsening right lower lobe patchiness.  6/1 acutely worsening hypoxia.  Likely etiology of patient's sepsis above.  Patient already on antibiotics as above.  currently NPO.  Brought up the possibility of NG tube with family today.  Would be available tomorrow 6/4 to place, which would allow for oral medication and nutrition.  Family is torn about pursuing but stated can wait until tomorrow to see his status and decide then.  Transaminitis -likely secondary to sepsis or perhaps medication  induced from renal insufficiency.  Will recheck LFTs in am.     Sepsis secondary to postop wound infection - 5/3 noted bacteremia with Klebsiella and Proteus.  Antibiotic regimen followed closely by infectious disease.  Original plan per ID was to receive cefepime  and daptomycin  for 4 weeks (through 6/5), transition to oral Levaquin  and linezolid  6/6 x 2 weeks.  This however is now lain by the wayside pending above.    L3-L4 laminectomy redo/acute on chronic L2-L5 fracture/osteoporosis/back pain - Followed closely by neurosurgery.  Antibiotic therapy as above.  PT on board.   Acute normocytic anemia - Hgb less than 7.0 on 6/2, likely exacerbated by aggressive volume resuscitation.  S/P 2u pRBCs.  Hgb > 8 this am.  Will transfuse if less than 7.  Will recheck CBC in AM.   Diabetes mellitus - Hypoglycemia resolved.  Insulin  sliding scale on board.     Paroxysmal atrial fibrillation - CHA2DS2-VASc score 3.  Likely exacerbated by infection.  Currently normal sinus rhythm.  Anticoagulation/antiplatelets deferred to neurosurgery team.   Goals of care - 6/3 - Patient with acute decompensation with septic appearance 6/1.  Noted worsening encephalopathy, hypotension, anuria, fever, hypoxia.  With family, nurse, paliative care present - Had a frank discussion about the patient's status, prognosis, goals of care with the patient's wife and multiple family members at bedside.  At this time, as the the patient is not requiring vasopressor medications nor needing intubation.  However, his rising potassium is giving concern about worsening decline and risk of arrhythmia.  Consensus confirmed that pt is  DNR.  the family is willing to continue current conservative medical treatment and an effort to have the patient bounce back.  Conerning an NG tube, this could be placed tomorrow.  Pt's wife was hesitant, but mentioned that we could reevaluate this decision tomorrow after an updated status.  NG tube would not be  permanent.  However should the patient continue to decline, warrant vasopressor medications, intubation, or dialysis, would likely be appropriate to pursue comfort care.  Patient's wife had stated that she would not want him to suffer.  Would likely pursue comfort care should he decompensate to that degree given his worsening functional status, quality of life.    Family very appreciative of the discussion.  Will continue to keep them up to date on status changes.    Subjective: Patient resting comfortably this morning, still encephalopathic. Continues on 6 L nasal cannula. Trace if any urine output. Febrile throughout the night, Tmax 102.  Blood cultures drawn again.  Wife and other family members at bedside, others via telephone/video call. Updated all on his current status, prognosis, new lab results, plan for today. All questions answered and concerns addressed.   Physical Exam:  Vitals:   01/16/24 0010 01/16/24 0548 01/16/24 0802 01/16/24 0852  BP: (!) 93/52 (!) 94/40  (!) 100/43  Pulse: 85 84  86  Resp: 18 18  16   Temp: 100.3 F (37.9 C) 98.5 F (36.9 C)  99.6 F (37.6 C)  TempSrc:      SpO2: 99% 99% 98% 98%  Weight:      Height:        GENERAL: Encephalopathic, ill-appearing HEENT: Nasal cannula CARDIOVASCULAR: Regular and tachycardic RESPIRATORY: Coarse breath sounds, mildly tachypneic/dyspneic GASTROINTESTINAL:  Soft, nontender, nondistended EXTREMITIES:  No LE edema bilaterally NEURO:  No new focal deficits appreciated SKIN:  No rashes noted PSYCH: Encephalopathic   Data Reviewed:  No new imaging to review at this time.  Previous records (including but not limited to H&P, progress notes, nursing notes, TOC management) were reviewed in assessment of this patient.  Labs: CBC: Recent Labs  Lab 01/12/24 0515 01/13/24 0319 01/14/24 0800 01/15/24 0328 01/15/24 1451 01/16/24 0348  WBC 10.1 13.3* 12.1* 15.6*  --  13.8*  HGB 7.8* 7.7* 7.6* 7.0* 6.8* 8.4*  HCT  25.0* 25.3* 24.9* 23.6* 23.1* 27.7*  MCV 94.3 94.4 96.5 99.2  --  96.2  PLT 225 223 229 198  --  176   Basic Metabolic Panel: Recent Labs  Lab 01/12/24 0515 01/13/24 0319 01/14/24 0800 01/15/24 0328 01/16/24 0348  NA 143 146* 148* 149* 151*  K 3.2* 3.7 4.0 4.7 5.4*  CL 114* 115* 120* 119* 124*  CO2 19* 20* 20* 21* 17*  GLUCOSE 88 95 109* 110* 119*  BUN 28* 34* 45* 54* 69*  CREATININE 0.97 1.16 1.95* 3.15* 4.88*  CALCIUM  7.6* 7.6* 7.1* 6.7* 6.4*  MG 2.2 2.3 2.4 2.5* 2.8*  PHOS  --   --   --  5.9* 6.8*   Liver Function Tests: Recent Labs  Lab 01/14/24 0800 01/15/24 0328 01/16/24 0348  AST 23 28 313*  ALT 12 13 72*  ALKPHOS 87 91 81  BILITOT 0.6 0.6 1.0  PROT 5.6* 5.7* 6.0*  ALBUMIN  <1.5* <1.5* 1.8*   CBG: Recent Labs  Lab 01/16/24 0910 01/16/24 0924 01/16/24 0954 01/16/24 1038 01/16/24 1207  GLUCAP 215* 166* 149* 127* 161*    Scheduled Meds:  (feeding supplement) PROSource Plus  30 mL Oral BID BM  vitamin C   1,000 mg Oral Daily   bumetanide (BUMEX) IV  2 mg Intravenous Q8H   calcitonin (salmon)  1 spray Alternating Nares Daily   calcium  carbonate  1 tablet Oral Q breakfast   Chlorhexidine  Gluconate Cloth  6 each Topical Daily   cholecalciferol   1,000 Units Oral Daily   cyanocobalamin   1,000 mcg Oral Daily   enoxaparin  (LOVENOX ) injection  30 mg Subcutaneous Daily   feeding supplement  237 mL Oral BID BM   [START ON 01/21/2024] feeding supplement (PROSource TF20)  60 mL Per Tube BID   ferrous sulfate   325 mg Oral Q breakfast   finasteride   5 mg Oral Daily   folic acid   1 mg Oral Daily   hydrocortisone  sod succinate (SOLU-CORTEF ) inj  100 mg Intravenous Q12H   ipratropium-albuterol   3 mL Nebulization Q6H   levothyroxine   112 mcg Oral Q0600   loratadine   10 mg Oral Daily   modafinil   200 mg Oral Daily   multivitamin with minerals  1 tablet Oral Daily   nystatin    Topical TID   pantoprazole   40 mg Oral Daily   polyethylene glycol  17 g Oral Daily    predniSONE   2.5 mg Oral Q breakfast   scopolamine  1 patch Transdermal Q72H   senna  2 tablet Oral BID   sodium chloride  flush  3 mL Intravenous Q12H   [START ON 01/25/2024] thiamine   100 mg Per Tube Daily   zinc  sulfate (50mg  elemental zinc )  220 mg Oral Daily   Continuous Infusions:  albumin  human 12.5 g (01/16/24 1215)   azithromycin 500 mg (01/16/24 1313)   ceFEPime  (MAXIPIME ) IV 2 g (01/16/24 0812)   [START ON 01/28/2024] feeding supplement (OSMOLITE 1.5 CAL)     linezolid  (ZYVOX ) IV 600 mg (01/16/24 1037)   metronidazole 500 mg (01/16/24 0406)   PRN Meds:.acetaminophen , bisacodyl , dextrose , HYDROmorphone  (DILAUDID ) injection, ipratropium-albuterol , LORazepam , melatonin, menthol -cetylpyridinium **OR** phenol, ondansetron  **OR** ondansetron  (ZOFRAN ) IV, oxyCODONE , sodium chloride  flush, sodium chloride  flush, sodium chloride  flush  Family Communication: Wife at bedside, other family members via video group call.  Disposition: Status is: Inpatient Remains inpatient appropriate because: severe sepsis     Time spent: 61 minutes  Length of inpatient stay: 42 days  Author: Jodeane Mulligan, DO 01/16/2024 1:34 PM  For on call review [email or web address].

## 2024-01-16 NOTE — Progress Notes (Addendum)
 Nutrition Follow-up  DOCUMENTATION CODES:   Not applicable, Severe malnutrition in context of chronic illness  INTERVENTION:   -Once cortrak has been placed and placement has been confirmed:   Initiate Osmolite 1.5 @ 20 ml/hr and increase by 10 ml every 12 hours to goal rate of 60 ml/hr.   60 ml Prosource TF20 BID  Tube feeding regimen provides 2320 kcal (100% of needs), 130 grams of protein, and 1097 ml of H2O.    -RD will monitor for goals of care and make further recommendations as appropriate -Continue 100 mg thiamine  daily  -Monitor Mg, K, and Phos and replete as needed secondary ton high refeeding risk   NUTRITION DIAGNOSIS:   Severe Malnutrition related to chronic illness as evidenced by severe fat depletion, severe muscle depletion, energy intake < or equal to 75% for > or equal to 1 month, percent weight loss (24 lbs weight loss, 11% in 2 months).  Ongoing  GOAL:   Patient will meet greater than or equal to 90% of their needs  Unmet  MONITOR:   PO intake, Supplement acceptance, Skin, Labs, I & O's  REASON FOR ASSESSMENT:   Consult Assessment of nutrition requirement/status, Poor PO  ASSESSMENT:   73 y.o male admitted for severe back pain a couple weeks after his lumbar fusion surgery (11/13/23). Pt underwent ORIF of the previous fusion and fractures on (12/08/23).  Continues with back and leg pain with concern now for sepsis from surgical wound and acute metabolic encephlopathy. PMH of HTN, gout, GLD, hypothyroidism, sleep apnea, GERD.  3/31: Lumbar fusion surgery  4/22: Admitted  4/25: ORIF of previous fusion and fractures 5/13: CT scan showed fraacture of L5 pedicle  5/20: Concerns for aspiraiton, SLP consulted 5/21: SP recommends regular diet, thin liquids  5/29- s/p BSE- regular diet with nectar thick liquids 5/30- s/p MBSS- NPO 5/31- s/p BSE- NPO 6/2- s/p BSE- NPO   Reviewed I/O's: +784 ml x 24 hours and +2.7 L since 01/02/24  UOP: 0 ml x 24  hours  Pt unavailable at time of visit. Attempted to speak with pt via call to hospital room phone, however, unable to reach.   Pt remains encephalopathic.   Per MD notes, palliative following for comfort care.They would like to await time for outcomes include culture results.   Case discussed with SLP, who reports poor threshold for diet advancement and poor oral intake. Pt has been NPO since 01/12/24.   Case discussed with MD, RN, and palliative care. Discussed concern regarding prolonged NPO status and limited likelihood of progressing to PO diet and having adequate oral intake, along with identified malnutrition. Palliative plans to revisit for goals of care discussions. MD gave RD permission to place cortrak order and TF for feeding access to be placed tomorrow (01/17/24).   No wt loss noted over the past month.  Medications reviewed and include calcium  carbonate, vitamin C , vitamin D3, vitamin B-12, lovenox , ferrosu sulfate, folic acid , solu-cortef , protonix , miralax , prednisone , thiamine , IV albumin , dextrose  5% solution @  100 ml/hr, and zinc  sulfate.   Per TOC notes, plan for SNF placement on discharge.   Labs reviewed: Na: 151, K: 5.4, Phos: 6.8, CBGS: 110-215 (inpatient orders for glycemic control are none).    Diet Order:   Diet Order             Diet NPO time specified  Diet effective now  EDUCATION NEEDS:   Not appropriate for education at this time  Skin:  Skin Assessment: Skin Integrity Issues: Skin Integrity Issues:: Stage II, DTI, Incisions DTI: Left toe, right heel Stage II: Buttocks Incisions: Back, Left pretibial  Last BM:  01/11/24  Height:   Ht Readings from Last 1 Encounters:  12/25/23 6' (1.829 m)    Weight:   Wt Readings from Last 1 Encounters:  01/14/24 125.2 kg    Ideal Body Weight:  80.9 kg  BMI:  Body mass index is 37.43 kg/m.  Estimated Nutritional Needs:   Kcal:  2200-2400 kcal  Protein:  120-140  gm  Fluid:  >2L/day    Herschel Lords, RD, LDN, CDCES Registered Dietitian III Certified Diabetes Care and Education Specialist If unable to reach this RD, please use "RD Inpatient" group chat on secure chat between hours of 8am-4 pm daily

## 2024-01-16 NOTE — Progress Notes (Signed)
 Repeat BMP showing climbing potassium.  Urine output still zero.    Met with pt's wife and family at bedside to discuss ongoing care.  Suggested that rectal Kayexalate may be helpful to reduce his potassium but will not fix the problem of his kidney function.  Family realizes as a temporizing solution.  Discussion with the family at bedside about his ongoing prognosis declining, worsening potassium, renal function, as well as ongoing quality of life and previous goals of care.  Anticipate that if nothing more were to be done to correct his potassium, the rate of his potassium increase puts him at increased risk of arrhythmia, cardiac arrest, and death by tonight or tomorrow.  Family is aware of this prognosis.  After much discussion and deliberation, family consensus and ultimately the patient's wife have decided to pursue comfort care options for the patient.  At this time we will place comfort care orders.  Patient is DNR.  Discontinue all medications, vital checks, lab draws.  Morphine  as needed pain, Ativan  as needed agitation.  Family members were very appreciative of the care the hospitals provided and of the time they were able to spend with the patient.  Critical care time: 35 minutes

## 2024-01-16 NOTE — Progress Notes (Signed)
 MEWS Progress Note  Patient Details Name: Norman Lambert MRN: [id number] DOB: 07-May-1951 Today's Date: 01/16/2024   MEWS Flowsheet Documentation:  Assess: MEWS Score Temp: 99.6 F (37.6 C) BP: (!) 100/43 MAP (mmHg): (!) 59 Pulse Rate: 86 ECG Heart Rate: 91 Resp: 16 Level of Consciousness: Responds to Pain SpO2: 98 % O2 Device: Room Air Patient Activity (if Appropriate): In bed O2 Flow Rate (L/min): 6 L/min FiO2 (%): 21 % Assess: MEWS Score MEWS Temp: 0 MEWS Systolic: 1 MEWS Pulse: 0 MEWS RR: 0 MEWS LOC: 2 MEWS Score: 3 MEWS Score Color: Yellow Assess: SIRS CRITERIA SIRS Temperature : 0 SIRS Respirations : 0 SIRS Pulse: 0 SIRS WBC: 0 SIRS Score Sum : 0 SIRS Temperature : 1 SIRS Pulse: 0 SIRS Respirations : 0 SIRS WBC: 0 SIRS Score Sum : 1 Assess: if the MEWS score is Yellow or Red Were vital signs accurate and taken at a resting state?: Yes Does the patient meet 2 or more of the SIRS criteria?: No Does the patient have a confirmed or suspected source of infection?: Yes MEWS guidelines implemented : No, previously yellow, continue vital signs every 4 hours    Macarthur Savory MD aware of pt. MEWS, no new orders at this time. Continue with current treatment plan.    Kristine Phalen 01/16/2024, 12:34 PM

## 2024-01-17 LAB — LEGIONELLA PNEUMOPHILA SEROGP 1 UR AG: L. pneumophila Serogp 1 Ur Ag: NEGATIVE

## 2024-01-18 ENCOUNTER — Ambulatory Visit: Admitting: Infectious Diseases

## 2024-01-19 LAB — CULTURE, BLOOD (ROUTINE X 2)
Culture: NO GROWTH
Culture: NO GROWTH

## 2024-01-21 LAB — CULTURE, BLOOD (ROUTINE X 2)
Culture: NO GROWTH
Culture: NO GROWTH
Special Requests: ADEQUATE
Special Requests: ADEQUATE

## 2024-01-31 ENCOUNTER — Ambulatory Visit: Admitting: Internal Medicine

## 2024-02-13 NOTE — Progress Notes (Signed)
 Cortrak Tube Team Note:  Consult received to place a Cortrak feeding tube.   Chart reviewed. Pt now transitioned to comfort care.  No further nutrition interventions planned at this time.Will discontinue Cortrak order. Please re-consult as needed.   Scheryl Cushing RD, LDN Contact via [employer].

## 2024-02-13 NOTE — Death Summary Note (Signed)
 DEATH SUMMARY   Patient Details  Name: Norman Lambert MRN: [id number] DOB: 12/25/1950 XBJ:[id number], No Pcp Per Admission/Discharge Information   Admit Date:  12-12-23  Date of Death: Date of Death: 2024-01-24  Time of Death: Time of Death: 0632  Length of Stay: Feb 10, 2042   Principle Cause of death: acute renal failure secondary to septic shock  Hospital Diagnoses: Active Problems:   S/P lumbar fusion   Severe sepsis (HCC)   Bacteremia due to Proteus species   Pressure injury of skin   Paroxysmal A-fib (HCC)   Acute postoperative anemia due to expected blood loss   Benign prostatic hyperplasia   Gastroesophageal reflux disease   Acute metabolic encephalopathy   Polymicrobial bacterial infection   Protein-calorie malnutrition, severe   Anuria and oliguria   Acute kidney failure with tubular necrosis (HCC)   Aspiration pneumonia (HCC)   Acute hypoxic respiratory failure [facility])   Hospital Course: 73 year old male with past medical history of hypertension, gout, hyperlipidemia, hypothyroidism, sleep apnea was admitted on 4/25 for reduction and internal fixation of previous fusions involving L2-S1. Postoperative course initially seemed unremarkable but subsequently he continued to have increased pain involving the left foot seemingly not responding to as needed narcotics. Imaging of the back was unremarkable, he continued to have drain VAC in place.  Subsequently on 12/15/2023 patient had hypotension with tachycardia and fever up to 103 F.  Patient was then started on IV cefepime  and vancomycin  with concern about sepsis.  There was significant wound erythema with serosanguineous foul-smelling drainage from the surgical site.  Patient had Levophed  briefly but subsequently was weaned off    Significant hospital events 4/25 Extensive L2-S1 lumbar surgery 4/30 New onset pain  5/1 Ongoing pain with borderline blood pressure 5/2 Pain, leukocytosis fever, 5/3 Hypotension, not responding to  crystalloid started on broad-spectrum antibiotics wound assessed by surgical team culture sent imaging pending ICU transfer. CT imaging of lumbar spine was negative for definitive abscess there were stable fractures at L3 and L4 with possible new fracture involving the transverse process of L2 and L3 on the left postoperative changes noted.CT angio of chest was negative for pulmonary emboli had bibasilar airspace disease the right was a little worse than the left consistent with possible pneumonia there was aortic aneurysmal dilation of the ascending aorta measuring at 4.5 cm 5/4 Weaned off norepinephrine , Blood cultures from 1 out of 2 samples growing gram-negative rods with BC ID showing both proteus and Enterobacterales, MRSA PCR negative. Narrowed to ceftriaxone . 5/5 Remains on low dose NE. Febrile. BCx growing proteus.  5/6 Wound Cx with proteus, klebsiella aerogenes. Abx re-broadened to cefepime . 5/8 Required low dose norepi overnight but off this AM. His wound vac was changed this AM and has been having some pain 5/9 no issues overnight, denies any acute pain this a.m. 5/14-continues to have back pain. 5/17-has been advised bedrest for back pain.   5/19-removal of wound VAC.  PT evaluation. 5/20-concerns for aspiration show speech therapy was consulted,. for modified barium swallow. 5/21-speech therapy recommend regular diet. 5/27: Hemodynamically stable, still having significant pain, family wants SNF near [place]-TOC is working on it.  Mentation improved 5/28 - Mild aspiration with feeding again.  Made NPO 5/29 - resume diet per SLP. 5/30 - npo again 6/1 - becoming septic.  Encephalopathic, hypotensive, fever 100.5, oliguric.  Family discussion about goals of care, no ICU if it comes to it. 6/3 - Remains septic, febrile, oliguric.  No desire to transfer to ICU/progressive.  Family waiting to see how conservative treatment progresses.   Assessment and Plan:   New episode of severe  sepsis - On 6/1, worsening encephalopathy, oliguria, hypotension, fever of 100.5.  Likely source aspiration however unclear given patient's chronic broad-spectrum antibiotic use.  Blood cultures reordered again 6/3.  DC 6/1 NGTD.  Received multiple IV boluses.  IV Flagyl  and IV azithromycin  added to cefepime  plus daptomycin .  Will change daptomycinto linezolid  today.  Continue hydrocortisone  IV 50mg  q6hr.  Blood pressure appears stable for now, SBP 90-100s.  Will continue to monitor closely.   Acute hypoxic respiratory failure with concern for ARDS - Currently requiring 6L nasal cannula.  Chest x-ray 6/1 noting worsening bilateral patchiness suggestive of likely worsening aspirations or vascular congestion.  Rapid COVID/flu/RSV negative.  Rapid respiratory pathogen profile negative.  Strep pneumo urine antigen negative.  Legionella Ur antigen pending.  Abx as above.  Continue to supplement O2 as above.  Attempt to wean as tolerated.     Acute kidney injury with oliguria/anuria and ATN - Noted oliguria beginning 5/31 evening.  Zero urine output overnight again.  Foley catheter inserted 6/1.  Likely secondary to severe sepsis.  Continue IV Bumex  2 mg every 8 hours.  IV albumin  every 6 hours.  Monitor I's and O's closely.  Will recheck BMP and mag in AM.  No indication for dialysis at this time.  Will monitor his acidemia, electrolytes, volume status very closely.   Hyperkalemia -Worsening this morning, likely secondary to above.  Ordered D50/IV insulin .  Unfortunately no viable option for ongoing K removal (no urine output, no feeding/ng tube, not pursuing dialysis).  Will recheck BMP and mag later this afternoon.     Mild hypernatremia - Continues to have mild free water deficit despite aggressive isotonic resuscitation previously.  Respiratory status stable, so will proceed with NS bolus and then D5W x 500ml.  Recheck BMP later this afternoon and in am.  Will continue to be cautious with fluid  administration given anuria/oliguria.   Acute metabolic encephalopathy - Much worse this morning.  Mostly lethargic.  Likely result of sepsis.  Continue to monitor closely.   Dysphagia with aspiration - Had another aspiration event 5/28 with feeding, increased coughing, noting worsening right lower lobe patchiness.  6/1 acutely worsening hypoxia.  Likely etiology of patient's sepsis above.  Patient already on antibiotics as above.  currently NPO.  Brought up the possibility of NG tube with family today.  Would be available tomorrow 6/4 to place, which would allow for oral medication and nutrition.  Family is torn about pursuing but stated can wait until tomorrow to see his status and decide then.   Transaminitis -likely secondary to sepsis or perhaps medication induced from renal insufficiency.  Will recheck LFTs in am.     Sepsis secondary to postop wound infection - 5/3 noted bacteremia with Klebsiella and Proteus.  Antibiotic regimen followed closely by infectious disease.  Original plan per ID was to receive cefepime  and daptomycin  for 4 weeks (through 6/5), transition to oral Levaquin  and linezolid  6/6 x 2 weeks.  This however is now lain by the wayside pending above.    L3-L4 laminectomy redo/acute on chronic L2-L5 fracture/osteoporosis/back pain - Followed closely by neurosurgery.  Antibiotic therapy as above.  PT on board.   Acute normocytic anemia - Hgb less than 7.0 on 6/2, likely exacerbated by aggressive volume resuscitation.  S/P 2u pRBCs.  Hgb > 8 this am.  Will transfuse if less than 7.  Will  recheck CBC in AM.   Diabetes mellitus - Hypoglycemia resolved.  Insulin  sliding scale on board.     Paroxysmal atrial fibrillation - CHA2DS2-VASc score 3.  Likely exacerbated by infection.  Currently normal sinus rhythm.  Anticoagulation/antiplatelets deferred to neurosurgery team.   Goals of care - 6/3 - Patient with acute decompensation with septic appearance 6/1.  Noted worsening  encephalopathy, hypotension, anuria, fever, hypoxia.  With family, nurse, paliative care present - Had a frank discussion about the patient's status, prognosis, goals of care with the patient's wife and multiple family members at bedside.  At this time, as the the patient is not requiring vasopressor medications nor needing intubation.  However, his rising potassium is giving concern about worsening decline and risk of arrhythmia.  Consensus confirmed that pt is DNR.  the family is willing to continue current conservative medical treatment and an effort to have the patient bounce back.  Conerning an NG tube, this could be placed tomorrow.  Pt's wife was hesitant, but mentioned that we could reevaluate this decision tomorrow after an updated status.  NG tube would not be permanent.  However should the patient continue to decline, warrant vasopressor medications, intubation, or dialysis, would likely be appropriate to pursue comfort care.  Patient's wife had stated that she would not want him to suffer.  Would likely pursue comfort care should he decompensate to that degree given his worsening functional status, quality of life.     Repeat BMP showing climbing potassium.  Urine output still zero.     Met with pt's wife and family at bedside to discuss ongoing care.  Suggested that rectal Kayexalate  may be helpful to reduce his potassium but will not fix the problem of his kidney function.  Family realizes as a temporizing solution.  Discussion with the family at bedside about his ongoing prognosis declining, worsening potassium, renal function, as well as ongoing quality of life and previous goals of care.  Anticipate that if nothing more were to be done to correct his potassium, the rate of his potassium increase puts him at increased risk of arrhythmia, cardiac arrest, and death by tonight or tomorrow.  Family is aware of this prognosis.   After much discussion and deliberation, family consensus and ultimately  the patient's wife have decided to pursue comfort care options for the patient.   At this time we will place comfort care orders.  Patient is DNR.  Discontinue all medications, vital checks, lab draws.  Morphine  as needed pain, Ativan  as needed agitation.   Family members were very appreciative of the care the hospitals provided and of the time they were able to spend with the patient.  Pt passed peacefully at 0632 on 2024-01-21  Assessment and Plan: No notes have been filed under this hospital service. Service: Hospitalist        Procedures:   Consultations:   The results of significant diagnostics from this hospitalization (including imaging, microbiology, ancillary and laboratory) are listed below for reference.   Significant Diagnostic Studies: DG Swallowing Func-Speech Pathology Result Date: 01/15/2024 Table formatting from the original result was not included. Modified Barium Swallow Study Patient Details Name: Norman Lambert MRN: [id number] Date of Birth: 11/16/1950 Today's Date: 01/12/2024 HPI/PMH: HPI: JONAS GOH is a 73 yo male presenting to ED 4/22 with severe lower back pain. Underwent laminectomy L3-4 with posterior fixation L2-S1. Recovery complicated by pain and hypotension and was transferred to ICU 5/3. PMH includes gout, arthritis, HTN, HLD, PVD, RBBB, R  reverse TSA, sleep apnea Clinical Impression: Clinical Impression: Pt was administered Ativan  at 1245 and was lethargic for the MBS. He was unable to sustain alertness or hold his head up independently. The entirety of boluses of thin liquids and purees were lost anteriorly without awareness. A pharyngeal swallow response was not observed throughout the study. Although his presentation has likely been impacted by recent medication administration, his level of alertness was different than previously observed during session earlier this date and he had increased difficulty containing boluses orally. His breathing is audible and  he coughs persistently, which is wet in quality. At this time, recommend pt be made NPO pending further assessment. SLP will continue following but suspect MBS will need to be repeated prior to resuming diet. Factors that may increase risk of adverse event in presence of aspiration [name] & [name] 2021): Factors that may increase risk of adverse event in presence of aspiration [name] & [name] 2021): Poor general health and/or compromised immunity; Reduced cognitive function; Limited mobility; Frail or deconditioned; Dependence for feeding and/or oral hygiene Recommendations/Plan: Swallowing Evaluation Recommendations Swallowing Evaluation Recommendations Recommendations: NPO Oral care recommendations: Oral care QID (4x/day); Oral care before ice chips/water Treatment Plan Treatment Plan Treatment recommendations: Therapy as outlined in treatment plan below Follow-up recommendations: Skilled nursing-short term rehab (<3 hours/day) Functional status assessment: Patient has had a recent decline in their functional status and demonstrates the ability to make significant improvements in function in a reasonable and predictable amount of time. Treatment duration: 2 weeks Interventions: Aspiration precaution training; Compensatory techniques; Patient/family education; Trials of upgraded texture/liquids; Diet toleration management by SLP Recommendations Recommendations for follow up therapy are one component of a multi-disciplinary discharge planning process, led by the attending physician.  Recommendations may be updated based on patient status, additional functional criteria and insurance authorization. Assessment: Orofacial Exam: Orofacial Exam Oral Cavity: Oral Hygiene: WFL Oral Cavity - Dentition: Adequate natural dentition Orofacial Anatomy: WFL Oral Motor/Sensory Function: WFL Anatomy: Anatomy: Suspected cervical osteophytes; Prominent cricopharyngeus Boluses Administered: Boluses Administered Boluses  Administered: Thin liquids (Level 0); Puree  Oral Impairment Domain: Oral Impairment Domain Lip Closure: Escape beyond mid-chin Tongue control during bolus hold: Not tested Bolus transport/lingual motion: Delayed initiation of tongue motion (oral holding) Initiation of pharyngeal swallow : No visible initiation at any location  Pharyngeal Impairment Domain: No data recorded Esophageal Impairment Domain: No data recorded Pill: No data recorded Penetration/Aspiration Scale Score: No data recorded Compensatory Strategies: Compensatory Strategies Compensatory strategies: No   General Information: Caregiver present: No  Diet Prior to this Study: Regular; Mildly thick liquids (Level 2, nectar thick)   Temperature : Normal   Respiratory Status: WFL   Supplemental O2: None (Room air)   History of Recent Intubation: No  Behavior/Cognition: Lethargic/Drowsy; Requires cueing Self-Feeding Abilities: Needs assist with self-feeding Baseline vocal quality/speech: Normal Volitional Cough: Able to elicit Volitional Swallow: Able to elicit Exam Limitations: Poor participation; Poor positioning; Fatigue Goal Planning: Prognosis for improved oropharyngeal function: Fair Barriers to Reach Goals: Cognitive deficits; Time post onset; Severity of deficits No data recorded Patient/Family Stated Goal: none stated Consulted and agree with results and recommendations: Patient; Physician Pain: Pain Assessment Faces Pain Scale: 0 End of Session: Start Time:SLP Start Time (ACUTE ONLY): 1409 Stop Time: SLP Stop Time (ACUTE ONLY): 1425 Time Calculation:SLP Time Calculation (min) (ACUTE ONLY): 16 min Charges: No data recorded SLP visit diagnosis: SLP Visit Diagnosis: Dysphagia, oropharyngeal phase (R13.12) Past Medical History: Past Medical History: Diagnosis Date  Abnormal EKG   Arthritis  Essential hypertension 01/02/2017  GERD (gastroesophageal reflux disease)   Gout   Hyperlipidemia 01/02/2017  Hypertension   Hypothyroidism   Peripheral vascular  disease (HCC)   RBBB 01/02/2017  Rotator cuff tear   Sleep apnea  Past Surgical History: Past Surgical History: Procedure Laterality Date  APPENDECTOMY    CATARACT EXTRACTION Bilateral   EYE SURGERY    8 years ago  FEMORAL ENDARTERECTOMY Bilateral   done 15 yrs. ago at [facility]  JOINT REPLACEMENT    LAMINECTOMY WITH POSTERIOR LATERAL ARTHRODESIS LEVEL 4 N/A 12/08/2023  Procedure: Lumbar Two - Sacral Two Revision - Extension of Fusion with O-Arm;  Surgeon: Joaquin Mulberry, MD;  Location: [facility] OR;  Service: Neurosurgery;  Laterality: N/A;  L2 - S2 Revision - Extension of Fusion with O-Arm  LUMBAR LAMINECTOMY/DECOMPRESSION MICRODISCECTOMY Left 04/23/2020  Procedure: MICRO LUMBER DECOMPRESSION LUMBAR FOUR-SACRAL ONE ON LEFT AND FORAMINOTOMY LUMBAR FIVE LEFT;  Surgeon: Orvan Blanch, MD;  Location: [facility] OR;  Service: Orthopedics;  Laterality: Left;  posterior  LUMBAR WOUND DEBRIDEMENT N/A 12/25/2023  Procedure: LUMBAR WOUND IRRIGATION AND DEBRIDEMENT;  Surgeon: Joaquin Mulberry, MD;  Location: [facility] OR;  Service: Neurosurgery;  Laterality: N/A;  REVERSE SHOULDER ARTHROPLASTY Right 12/16/2016  Procedure: RIGHT REVERSE TOTAL SHOULDER ARTHROPLASTY;  Surgeon: Winston Hawking, MD;  Location: [facility] OR;  Service: Orthopedics;  Laterality: Right;  SEPTOPLASTY    TONSILLECTOMY   Amil Kale, M.A., CCC-SLP Speech Language Pathology, Acute Rehabilitation Services Secure Chat preferred [phone] 01/15/2024, 9:27 AM  DG CHEST PORT 1 VIEW Result Date: 01/14/2024 CLINICAL DATA:  Respiratory failure. EXAM: PORTABLE CHEST 1 VIEW COMPARISON:  01/02/2024 FINDINGS: Patchy bilateral airspace disease again noted, progressive in the interval. The cardio pericardial silhouette is enlarged. No substantial pleural effusion. Right PICC line noted although tip is obscured over the mediastinum. Bones are diffusely demineralized. Telemetry leads overlie the chest. IMPRESSION: Progressive patchy bilateral airspace disease. Imaging features could be related to  edema or infection. Right PICC line tip is obscured over the mediastinum. Electronically Signed   By: Donnal Fusi M.D.   On: 01/14/2024 11:48   DG CHEST PORT 1 VIEW Result Date: 01/10/2024 CLINICAL DATA:  Acute dyspnea. EXAM: PORTABLE CHEST 1 VIEW COMPARISON:  CT chest 12/24/2023 FINDINGS: RIGHT PICC line tip in distal SVC. Stable cardiac silhouette. There is interval increase in bilateral patchy airspace disease. No focal consolidation. Small effusions. No pneumothorax. RIGHT shoulder arthroplasty IMPRESSION: 1. Interval increase in bilateral patchy airspace disease. Findings concerning for multifocal pneumonia. 2. Small effusions. Electronically Signed   By: Deboraha Fallow M.D.   On: 01/10/2024 16:25   DG CHEST PORT 1 VIEW Result Date: 01/03/2024 CLINICAL DATA:  [id number] Dyspnea [id number] EXAM: PORTABLE CHEST - 1 VIEW COMPARISON:  Dec 24, 2023 FINDINGS: Right PICC terminates in the lower SVC. No focal airspace consolidation, pleural effusion, or pneumothorax. Mild cardiomegaly. Aortic atherosclerosis. Right shoulder arthroplasty. Multilevel thoracic osteophytosis. IMPRESSION: No acute cardiopulmonary abnormality. Electronically Signed   By: [name] M.D.   On: 01/03/2024 16:58   DG Swallowing Func-Speech Pathology Result Date: 01/03/2024 Table formatting from the original result was not included. Modified Barium Swallow Study Patient Details Name: DAVI KROON MRN: [id number] Date of Birth: 09-Feb-1951 Today's Date: 01/03/2024 HPI/PMH: HPI: AUDRY PECINA is a 73 yo male presenting to ED 4/22 with severe lower back pain. Underwent laminectomy L3-4 with posterior fixation L2-S1. Recovery complicated by pain and hypotension and was transferred to ICU 5/3. PMH includes gout, arthritis, HTN, HLD, PVD, RBBB, R reverse  TSA, sleep apnea Clinical Impression: Clinical Impression: Pt presents with moderate oropharyngeal dysphagia characterized by mistiming. Swallow initiation is consistently triggered after the  bolus collects in the pyriform sinuses. This results in spillage of the tail of the bolus as the swallow is initiated which enters the laryngeal vestibule. Thin and nectar thick liquids resulted in trace penetration that was not cleared independently (PAS 3). When the 13 mm barium tablet was introduced, pt had increased difficulty coordinating swallow initiation, resulting in silent aspiration of thin liquids (PAS 8). A cued cough effectively cleared the majority of aspirated material. This was an isolated event and did not recur in subsequent trials when pt was challenged with sequential straw sips. There is frequently a collection of BOT and vallecular residue that increases as the consistency becomes thicker. Suspect there is potential for his performance to fluctuate given mentation and stamina throughout the course of a meal. Provided education to pt and his wife regarding results and using compensatory strategies including intermittent cough, rate control, and avoiding pills with liquids. Recommend resuming regular diet with thin liquids. Pills should be given whole or crushed with puree. SLP will continue following at least briefly for ongoing assessment. Factors that may increase risk of adverse event in presence of aspiration [name] & [name] 2021): Factors that may increase risk of adverse event in presence of aspiration [name] & [name] 2021): Poor general health and/or compromised immunity; Reduced cognitive function; Limited mobility; Frail or deconditioned; Dependence for feeding and/or oral hygiene Recommendations/Plan: Swallowing Evaluation Recommendations Swallowing Evaluation Recommendations Recommendations: PO diet PO Diet Recommendation: Regular; Thin liquids (Level 0) Liquid Administration via: Cup; Straw Medication Administration: Whole meds with puree Supervision: Full assist for feeding; Full supervision/cueing for swallowing strategies Swallowing strategies  : Minimize environmental  distractions; Slow rate; Small bites/sips; Hard cough after swallowing Postural changes: Position pt fully upright for meals Oral care recommendations: Oral care BID (2x/day) Treatment Plan Treatment Plan Treatment recommendations: Therapy as outlined in treatment plan below Follow-up recommendations: Skilled nursing-short term rehab (<3 hours/day) Functional status assessment: Patient has had a recent decline in their functional status and demonstrates the ability to make significant improvements in function in a reasonable and predictable amount of time. Treatment frequency: Min 2x/week Treatment duration: 2 weeks Interventions: Aspiration precaution training; Compensatory techniques; Patient/family education; Trials of upgraded texture/liquids; Diet toleration management by SLP Recommendations Recommendations for follow up therapy are one component of a multi-disciplinary discharge planning process, led by the attending physician.  Recommendations may be updated based on patient status, additional functional criteria and insurance authorization. Assessment: Orofacial Exam: Orofacial Exam Oral Cavity: Oral Hygiene: WFL Oral Cavity - Dentition: Adequate natural dentition Orofacial Anatomy: WFL Oral Motor/Sensory Function: WFL Anatomy: Anatomy: Suspected cervical osteophytes; Prominent cricopharyngeus Boluses Administered: Boluses Administered Boluses Administered: Thin liquids (Level 0); Mildly thick liquids (Level 2, nectar thick); Moderately thick liquids (Level 3, honey thick); Puree; Solid  Oral Impairment Domain: Oral Impairment Domain Lip Closure: Escape progressing to mid-chin Tongue control during bolus hold: Escape to lateral buccal cavity/floor of mouth Bolus preparation/mastication: Timely and efficient chewing and mashing Bolus transport/lingual motion: Brisk tongue motion Oral residue: Trace residue lining oral structures Location of oral residue : Tongue; Palate Initiation of pharyngeal swallow :  Pyriform sinuses  Pharyngeal Impairment Domain: Pharyngeal Impairment Domain Soft palate elevation: No bolus between soft palate (SP)/pharyngeal wall (PW) Laryngeal elevation: Partial superior movement of thyroid  cartilage/partial approximation of arytenoids to epiglottic petiole Anterior hyoid excursion: Partial anterior movement Epiglottic movement: Complete inversion Laryngeal  vestibule closure: Complete, no air/contrast in laryngeal vestibule Pharyngeal stripping wave : Present - complete Pharyngeal contraction (A/P view only): N/A Pharyngoesophageal segment opening: Partial distention/partial duration, partial obstruction of flow Tongue base retraction: Trace column of contrast or air between tongue base and PPW Pharyngeal residue: Collection of residue within or on pharyngeal structures Location of pharyngeal residue: Tongue base; Valleculae; Pyriform sinuses  Esophageal Impairment Domain: Esophageal Impairment Domain Esophageal clearance upright position: Complete clearance, esophageal coating Pill: Pill Consistency administered: Thin liquids (Level 0) Thin liquids (Level 0): Impaired (see clinical impressions) Penetration/Aspiration Scale Score: Penetration/Aspiration Scale Score 1.  Material does not enter airway: Moderately thick liquids (Level 3, honey thick); Puree; Solid 3.  Material enters airway, remains ABOVE vocal cords and not ejected out: Mildly thick liquids (Level 2, nectar thick) 8.  Material enters airway, passes BELOW cords without attempt by patient to eject out (silent aspiration) : Thin liquids (Level 0) (when taking the pill) Compensatory Strategies: Compensatory Strategies Compensatory strategies: No   General Information: Caregiver present: Yes  Diet Prior to this Study: NPO   Temperature : Normal   Respiratory Status: WFL   Supplemental O2: None (Room air)   History of Recent Intubation: No  Behavior/Cognition: Alert; Cooperative Self-Feeding Abilities: Needs assist with self-feeding  Baseline vocal quality/speech: Normal Volitional Cough: Able to elicit Volitional Swallow: Able to elicit Exam Limitations: No limitations Goal Planning: Prognosis for improved oropharyngeal function: Good Barriers to Reach Goals: Cognitive deficits No data recorded Patient/Family Stated Goal: none stated Consulted and agree with results and recommendations: Patient; Family member/caregiver Pain: Pain Assessment Pain Assessment: Faces Faces Pain Scale: 4 Breathing: 0 Negative Vocalization: 0 Facial Expression: 0 Body Language: 0 Consolability: 0 PAINAD Score: 0 Pain Location: back when repositioning to chair Pain Descriptors / Indicators: Aching; Guarding Pain Intervention(s): Monitored during session End of Session: Start Time:SLP Start Time (ACUTE ONLY): 1023 Stop Time: SLP Stop Time (ACUTE ONLY): 1036 Time Calculation:SLP Time Calculation (min) (ACUTE ONLY): 13 min Charges: SLP Evaluations $ SLP Speech Visit: 1 Visit SLP Evaluations $BSS Swallow: 1 Procedure $MBS Swallow: 1 Procedure SLP visit diagnosis: SLP Visit Diagnosis: Dysphagia, oropharyngeal phase (R13.12) Past Medical History: Past Medical History: Diagnosis Date  Abnormal EKG   Arthritis   Essential hypertension 01/02/2017  GERD (gastroesophageal reflux disease)   Gout   Hyperlipidemia 01/02/2017  Hypertension   Hypothyroidism   Peripheral vascular disease (HCC)   RBBB 01/02/2017  Rotator cuff tear   Sleep apnea  Past Surgical History: Past Surgical History: Procedure Laterality Date  APPENDECTOMY    CATARACT EXTRACTION Bilateral   EYE SURGERY    8 years ago  FEMORAL ENDARTERECTOMY Bilateral   done 15 yrs. ago at [facility]  JOINT REPLACEMENT    LAMINECTOMY WITH POSTERIOR LATERAL ARTHRODESIS LEVEL 4 N/A 12/08/2023  Procedure: Lumbar Two - Sacral Two Revision - Extension of Fusion with O-Arm;  Surgeon: Joaquin Mulberry, MD;  Location: [facility] OR;  Service: Neurosurgery;  Laterality: N/A;  L2 - S2 Revision - Extension of Fusion with O-Arm  LUMBAR  LAMINECTOMY/DECOMPRESSION MICRODISCECTOMY Left 04/23/2020  Procedure: MICRO LUMBER DECOMPRESSION LUMBAR FOUR-SACRAL ONE ON LEFT AND FORAMINOTOMY LUMBAR FIVE LEFT;  Surgeon: Orvan Blanch, MD;  Location: [facility] OR;  Service: Orthopedics;  Laterality: Left;  posterior  LUMBAR WOUND DEBRIDEMENT N/A 12/25/2023  Procedure: LUMBAR WOUND IRRIGATION AND DEBRIDEMENT;  Surgeon: Joaquin Mulberry, MD;  Location: [facility] OR;  Service: Neurosurgery;  Laterality: N/A;  REVERSE SHOULDER ARTHROPLASTY Right 12/16/2016  Procedure: RIGHT REVERSE TOTAL SHOULDER ARTHROPLASTY;  Surgeon: Winston Hawking, MD;  Location: [facility] OR;  Service: Orthopedics;  Laterality: Right;  SEPTOPLASTY    TONSILLECTOMY   Amil Kale, M.A., CCC-SLP Speech Language Pathology, Acute Rehabilitation Services Secure Chat preferred [phone] 01/03/2024, 1:00 PM   Microbiology: No results found for this or any previous visit (from the past 240 hours).  Time spent: 30 minutes  Signed: Jodeane Mulligan, DO January 19, 2024

## 2024-02-13 NOTE — Progress Notes (Signed)
 PT Cancellation Note and Discharge  Patient Details Name: CHASETON YEPIZ MRN: [id number] DOB: [birth year]   Cancelled Treatment:    Reason Eval/Treat Not Completed:  Per chart review, pt/family have decided to pursue comfort care at this time. Acute PT will sign off. If needs change, please reconsult.   Venus Ginsberg 02/08/2024, 6:29 AM  Simone Dubois, PT, DPT Acute Rehabilitation Services Secure Chat Preferred Office: [phone]

## 2024-02-13 NOTE — Progress Notes (Signed)
 SLP Cancellation Note  Patient Details Name: Norman Lambert MRN: [id number] DOB: 11-22-1950   Cancelled treatment:       Reason Eval/Treat Not Completed: Per chart review, pt/family have decided to pursue comfort care at this time. SLP will sign off but please re-consult if needs change.    Amil Kale, M.A., CCC-SLP Speech Language Pathology, Acute Rehabilitation Services  Secure Chat preferred [phone]  02/09/2024, 9:05 AM

## 2024-02-13 NOTE — Progress Notes (Signed)
 [facility] called. Reference number: [id number]

## 2024-02-13 NOTE — Plan of Care (Signed)
 Problem: Education: Goal: Knowledge of General Education information will improve Description: Including pain rating scale, medication(s)/side effects and non-pharmacologic comfort measures 02/05/2024 0741 by Marizol Borror, RN Outcome: Progressing 01/31/2024 0740 by Pollyanna Levay, RN Outcome: Progressing   Problem: Health Behavior/Discharge Planning: Goal: Ability to manage health-related needs will improve 01/31/2024 0741 by Kelin Borum, RN Outcome: Progressing 01/19/2024 0740 by Conner Muegge, RN Outcome: Progressing   Problem: Clinical Measurements: Goal: Ability to maintain clinical measurements within normal limits will improve 01/20/2024 0741 by Marka Treloar, RN Outcome: Progressing 02/08/2024 0740 by Yerick Eggebrecht, RN Outcome: Progressing Goal: Will remain free from infection 02/04/2024 0741 by Kenyanna Grzesiak, RN Outcome: Progressing 01/25/2024 0740 by Terin Cragle, RN Outcome: Progressing Goal: Diagnostic test results will improve 02/08/2024 0741 by Kelby Lotspeich, RN Outcome: Progressing 01/21/2024 0740 by Milad Bublitz, RN Outcome: Not Progressing Goal: Respiratory complications will improve 02/02/2024 0741 by Lashaye Fisk, RN Outcome: Progressing 02/07/2024 0740 by Sadat Sliwa, RN Outcome: Not Progressing Goal: Cardiovascular complication will be avoided 02/10/2024 0741 by Kailoni Vahle, RN Outcome: Progressing 01/14/2024 0740 by Etan Vasudevan, RN Outcome: Not Progressing   Problem: Activity: Goal: Risk for activity intolerance will decrease 01/14/2024 0741 by Noele Icenhour, Emmerson Taddei, RN Outcome: Progressing 01/26/2024 0740 by Aloma Boch, RN Outcome: Not Progressing   Problem: Nutrition: Goal: Adequate nutrition will be maintained 01/22/2024 0741 by Gwendola Hornaday, RN Outcome: Progressing 01/14/2024 0740 by Berdia Lachman, RN Outcome: Not Progressing   Problem: Coping: Goal: Level of anxiety will  decrease 02/04/2024 0741 by Michole Lecuyer, RN Outcome: Progressing 01/30/2024 0740 by Lashe Oliveira, RN Outcome: Not Progressing   Problem: Elimination: Goal: Will not experience complications related to bowel motility 01/27/2024 0741 by Kyelle Urbas, RN Outcome: Progressing 01/26/2024 0740 by Veronica Fretz, RN Outcome: Not Progressing Goal: Will not experience complications related to urinary retention 01/24/2024 0741 by Lenvil Swaim, RN Outcome: Progressing 01/29/2024 0740 by Poonam Woehrle, RN Outcome: Not Progressing   Problem: Pain Managment: Goal: General experience of comfort will improve and/or be controlled 01/25/2024 0741 by Zelta Enfield, RN Outcome: Progressing 01/19/2024 0740 by Dominika Losey, RN Outcome: Not Progressing   Problem: Safety: Goal: Ability to remain free from injury will improve 01/30/2024 0741 by Ysenia Filice, RN Outcome: Progressing 02/08/2024 0740 by Ardel Jagger, RN Outcome: Not Progressing   Problem: Skin Integrity: Goal: Risk for impaired skin integrity will decrease 02/02/2024 0741 by Drako Maese, RN Outcome: Progressing 02/02/2024 0740 by Brady Plant, RN Outcome: Not Progressing   Problem: Education: Goal: Ability to verbalize activity precautions or restrictions will improve 01/29/2024 0741 by Moselle Rister, RN Outcome: Progressing 01/25/2024 0740 by Juanda Luba, RN Outcome: Not Progressing Goal: Knowledge of the prescribed therapeutic regimen will improve 02/04/2024 0741 by Wretha Laris, RN Outcome: Progressing 02/11/2024 0740 by Dagoberto Nealy, RN Outcome: Not Progressing Goal: Understanding of discharge needs will improve 01/26/2024 0741 by Havana Baldwin, RN Outcome: Progressing 02/01/2024 0740 by Rhona Fusilier, RN Outcome: Not Progressing   Problem: Activity: Goal: Ability to avoid complications of mobility impairment will improve 02/06/2024 0741 by ,  , RN Outcome: Progressing 01/14/2024 0740 by Kortlyn Koltz, RN Outcome: Not Progressing Goal: Ability to tolerate increased activity will improve 01/29/2024 0741 by Kimberlyann Hollar, RN Outcome: Progressing 01/28/2024 0740 by Maisy Newport, RN Outcome: Not Progressing Goal: Will remain free from falls 01/22/2024 0741 by Arland Usery, RN Outcome: Progressing 01/21/2024 0740 by Alizea Pell, RN Outcome: Not Progressing   Problem: Bowel/Gastric: Goal: Gastrointestinal status for postoperative course will improve 02/03/2024 0741 by Judeth Gilles, RN Outcome:  Progressing 01/21/2024 0740 by Ariyanna Oien, RN Outcome: Not Progressing   Problem: Clinical Measurements: Goal: Ability to maintain clinical measurements within normal limits will improve 02/10/2024 0741 by Loyal Rudy, RN Outcome: Progressing 01/27/2024 0740 by Iveth Heidemann, RN Outcome: Not Progressing Goal: Postoperative complications will be avoided or minimized 02/05/2024 0741 by Aamari West, RN Outcome: Progressing 02/10/2024 0740 by Nikitha Mode, RN Outcome: Not Progressing Goal: Diagnostic test results will improve 01/23/2024 0741 by Jamani Eley, RN Outcome: Progressing 02/08/2024 0740 by Jobani Sabado, RN Outcome: Not Progressing   Problem: Pain Management: Goal: Pain level will decrease 02/03/2024 0741 by Shareka Casale, RN Outcome: Progressing 02/03/2024 0740 by Gabriele Zwilling, RN Outcome: Not Progressing   Problem: Skin Integrity: Goal: Will show signs of wound healing 02/12/2024 0741 by Emine Lopata, RN Outcome: Progressing 01/18/2024 0740 by Whitt Auletta, RN Outcome: Not Progressing   Problem: Health Behavior/Discharge Planning: Goal: Identification of resources available to assist in meeting health care needs will improve 01/20/2024 0741 by Ida Uppal, RN Outcome: Progressing 02/08/2024 0740 by Chantille Navarrete,  RN Outcome: Not Progressing   Problem: Bladder/Genitourinary: Goal: Urinary functional status for postoperative course will improve 02/12/2024 0741 by Kaz Auld, RN Outcome: Progressing 01/28/2024 0740 by Keanna Tugwell, RN Outcome: Not Progressing

## 2024-02-13 NOTE — Progress Notes (Signed)
 OT Cancellation Note  Patient Details Name: Norman Lambert MRN: [id number] DOB: 01/04/1951   Cancelled Treatment:    Reason Eval/Treat Not Completed: Other (comment) (pt now comfort care, OT will s/o.)  Christofer Shen K, OTD, OTR/L SecureChat Preferred Acute Rehab [phone]   Antionette Kirks 01/23/2024, 7:38 AM

## 2024-02-13 DEATH — deceased
